# Patient Record
Sex: Male | Born: 1950 | Race: White | Hispanic: No | Marital: Single | Smoking: Former smoker
Health system: Southern US, Community
[De-identification: ages and names within clinical notes are randomized; demographics above are authoritative.]

## PROBLEM LIST (undated history)

## (undated) DIAGNOSIS — I10 Essential (primary) hypertension: Secondary | ICD-10-CM

## (undated) DIAGNOSIS — N179 Acute kidney failure, unspecified: Secondary | ICD-10-CM

## (undated) DIAGNOSIS — K228 Other specified diseases of esophagus: Secondary | ICD-10-CM

## (undated) DIAGNOSIS — L409 Psoriasis, unspecified: Secondary | ICD-10-CM

## (undated) DIAGNOSIS — C229 Malignant neoplasm of liver, not specified as primary or secondary: Secondary | ICD-10-CM

## (undated) DIAGNOSIS — K429 Umbilical hernia without obstruction or gangrene: Secondary | ICD-10-CM

## (undated) DIAGNOSIS — M1990 Unspecified osteoarthritis, unspecified site: Secondary | ICD-10-CM

## (undated) DIAGNOSIS — B1920 Unspecified viral hepatitis C without hepatic coma: Secondary | ICD-10-CM

## (undated) DIAGNOSIS — K7290 Hepatic failure, unspecified without coma: Secondary | ICD-10-CM

## (undated) DIAGNOSIS — I00 Rheumatic fever without heart involvement: Secondary | ICD-10-CM

## (undated) DIAGNOSIS — D696 Thrombocytopenia, unspecified: Secondary | ICD-10-CM

## (undated) DIAGNOSIS — D7589 Other specified diseases of blood and blood-forming organs: Secondary | ICD-10-CM

## (undated) DIAGNOSIS — R011 Cardiac murmur, unspecified: Secondary | ICD-10-CM

## (undated) DIAGNOSIS — E119 Type 2 diabetes mellitus without complications: Secondary | ICD-10-CM

## (undated) DIAGNOSIS — N400 Enlarged prostate without lower urinary tract symptoms: Secondary | ICD-10-CM

## (undated) DIAGNOSIS — I341 Nonrheumatic mitral (valve) prolapse: Secondary | ICD-10-CM

## (undated) DIAGNOSIS — R188 Other ascites: Secondary | ICD-10-CM

## (undated) HISTORY — DX: Other specified diseases of esophagus: K22.8

## (undated) HISTORY — DX: Thrombocytopenia, unspecified: D69.6

## (undated) HISTORY — DX: Essential (primary) hypertension: I10

## (undated) HISTORY — PX: TONSILLECTOMY: SUR1361

## (undated) HISTORY — PX: HERNIA REPAIR: SHX51

## (undated) HISTORY — DX: Psoriasis, unspecified: L40.9

## (undated) HISTORY — DX: Nonrheumatic mitral (valve) prolapse: I34.1

## (undated) HISTORY — PX: TONSILECTOMY, ADENOIDECTOMY, BILATERAL MYRINGOTOMY AND TUBES: SHX2538

## (undated) HISTORY — DX: Other specified diseases of blood and blood-forming organs: D75.89

## (undated) HISTORY — DX: Unspecified viral hepatitis C without hepatic coma: B19.20

---

## 1978-11-29 DIAGNOSIS — B1920 Unspecified viral hepatitis C without hepatic coma: Secondary | ICD-10-CM

## 1978-11-29 HISTORY — DX: Unspecified viral hepatitis C without hepatic coma: B19.20

## 2004-03-30 DIAGNOSIS — K2289 Other specified disease of esophagus: Secondary | ICD-10-CM

## 2004-03-30 HISTORY — DX: Other specified disease of esophagus: K22.89

## 2004-07-05 ENCOUNTER — Inpatient Hospital Stay (HOSPITAL_COMMUNITY): Admission: EM | Admit: 2004-07-05 | Discharge: 2004-07-09 | Payer: Self-pay | Admitting: Emergency Medicine

## 2004-08-29 ENCOUNTER — Ambulatory Visit (HOSPITAL_COMMUNITY): Admission: RE | Admit: 2004-08-29 | Discharge: 2004-08-29 | Payer: Self-pay | Admitting: Gastroenterology

## 2004-09-17 ENCOUNTER — Ambulatory Visit (HOSPITAL_COMMUNITY): Admission: RE | Admit: 2004-09-17 | Discharge: 2004-09-17 | Payer: Self-pay | Admitting: Gastroenterology

## 2009-04-04 ENCOUNTER — Ambulatory Visit: Payer: Self-pay | Admitting: Gastroenterology

## 2009-05-10 ENCOUNTER — Ambulatory Visit (HOSPITAL_COMMUNITY): Admission: RE | Admit: 2009-05-10 | Discharge: 2009-05-10 | Payer: Self-pay | Admitting: Gastroenterology

## 2009-05-30 ENCOUNTER — Ambulatory Visit: Payer: Self-pay | Admitting: Gastroenterology

## 2009-07-04 ENCOUNTER — Ambulatory Visit: Payer: Self-pay | Admitting: Gastroenterology

## 2009-09-12 ENCOUNTER — Ambulatory Visit: Payer: Self-pay | Admitting: Gastroenterology

## 2009-12-05 ENCOUNTER — Ambulatory Visit: Payer: Self-pay | Admitting: Gastroenterology

## 2010-04-20 ENCOUNTER — Encounter: Payer: Self-pay | Admitting: Gastroenterology

## 2010-08-15 NOTE — Op Note (Signed)
NAME:  Ethan Wade, CHRISTMAS NO.:  [id number]   MEDICAL RECORD NO.:  [id number]          PATIENT TYPE:  AMB   LOCATION:  ENDO                         FACILITY:  [facility]   PHYSICIAN:  Naren C. Madilyn Fireman, M.D.    DATE OF BIRTH:  1950/09/25   DATE OF PROCEDURE:  08/29/2004  DATE OF DISCHARGE:                                 OPERATIVE REPORT   PROCEDURE PERFORMED:  Esophagogastroduodenoscopy with esophageal variceal  banding.   INDICATIONS FOR PROCEDURE:  Known esophageal varices with previous bleeding,  status post banding approximately one month ago -- due to hepatitis C.   PROCEDURE:  The patient was placed in the left lateral decubitus position  and placed on the pulse monitor with continuous low-flow oxygen delivered by  nasal cannula.  He was sedated with 100 mcg IV fentanyl and 10 mg IV Versed.  The Olympus video endoscope was advanced under direct vision into the  oropharynx and esophagus. The esophagus was straight and of normal caliber.  Squamocolumnar line was  38 cm.  There were significant bleeding extended  varices, primarily with the mid esophagus, with visible distention of the  varices up to about 22 cm; there was no significant hemorrhage.  The stomach  was entered and a small amount of liquid secretions suctioned from the  fundus.  Retroflexed view of the cardia was unremarkable and revealed no  obvious gastric varices.  The fundus body, antrum and pylorus all appeared  normal,  with the exception of mild appearance of __________ proximally.  The pylorus was not deformed, __________.  Both the bulb and second portion  were inspected and appeared__________.  The scope was withdrawn with the  banding apparatus passed across the __________  six bands were placed  __________.  The scope was then withdrawn and the patient returned to the recovery room  in stable condition. He tolerated procedure well and there were no immediate  complications.   IMPRESSION:   Persistent esophageal varices, status post repeat banding x6.   PLAN:  Will follow up in the office regarding treatment of his hepatitis C.  Will probably perform one more elective endoscopy to see if further banding  is needed.      JCH/MEDQ  D:  08/29/2004  T:  08/29/2004  Job:  [id number]

## 2010-08-15 NOTE — Discharge Summary (Signed)
NAME:  ALGER, KERSTEIN NO.:  [id number]   MEDICAL RECORD NO.:  [id number]          PATIENT TYPE:  INP   LOCATION:  5705                         FACILITY:  [facility]   PHYSICIAN:  Darrold C. Madilyn Fireman, M.D.    DATE OF BIRTH:  10/09/1950   DATE OF ADMISSION:  07/04/2004  DATE OF DISCHARGE:  07/09/2004                                 DISCHARGE SUMMARY   HISTORY OF ILLNESS:  The patient is a 60 year old white male who presented  with the abrupt onset of nausea and vomiting and bright red hematemesis.  He  was initially hypotensive.  His initial hemoglobin was 12.2.  For details,  please see admission history and physical.   COURSE IN THE HOSPITAL:  The patient underwent endoscopy and was found to  have large esophageal varices, although I could not clearly discern a  bleeding source, had difficulty getting the banding apparatus down, and  could not place bands on the varices, and his proximal stomach was not well  inspected due to a large amount of blood in it.  He was admitted to the  intensive care unit and transfused 2 units of packed red blood cells.  He  did fairly well overnight and remained hemodynamically stable throughout his  admission.  On the morning of July 06, 2004, we repeated endoscopy, and  there was no further active bleeding.  The stomach was free of blood.  I did  not see any definite gastric varices, but a portal gastropathy appearance.  He had two or three small distal antral ulcers with positive CLOtest.  Six  bands were placed on the varices.  He tolerated this well and showed no  further evidence of bleeding.  He was treated with octreotide for three  days.  He remained very stable and was transferred out of the intensive care  unit on July 07, 2004.  On July 08, 2004, his diet was advanced, and his  hemoglobin remained stable between 12 and 13.  On July 09, 2004, he was  ambulating, tolerating a diet.  Had one moderate-size black stool which was  the  only stool he had while in the hospital.  He was felt ready for  discharge at that time.   DISCHARGE DIAGNOSES:  1.  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  2.  Small gastric ulcers with positive CLOtest.   CONDITION ON DISCHARGE:  Improved.   DISCHARGE MEDICATIONS:  1.  Prilosec OTC.  2.  Total avoidance of alcohol and nonsteroidal antiinflammatory drugs.   He will have labs done in three days, including a CBC and hepatitis C viral  RNA, and will follow up with Dr. Madilyn Fireman in two to three weeks.      JCH/MEDQ  D:  07/09/2004  T:  07/09/2004  Job:  [id number]   cc:   Lavera Guise, M.D.  [facility]  [place]  [place]

## 2010-08-15 NOTE — Op Note (Signed)
Ethan Wade, KIRK NO.:  [id number]   MEDICAL RECORD NO.:  [id number]          PATIENT TYPE:  INP   LOCATION:  2112                         FACILITY:  [facility]   PHYSICIAN:  Aeric C. Madilyn Fireman, M.D.    DATE OF BIRTH:  09/11/1950   DATE OF PROCEDURE:  07/06/2004  DATE OF DISCHARGE:                                 OPERATIVE REPORT   PROCEDURE:  Esophagogastroduodenoscopy with esophageal banding.   INDICATIONS FOR PROCEDURE:  Upper GI bleeding with varices, seen on EGD 2  days ago, unsuccessful banding due to apparatus down at the time.   ENDOSCOPIST:  Everardo All. Madilyn Fireman, M.D.   PROCEDURE:  The patient was placed in the left lateral decubitus position  and placed on the pulse monitor with continuous low-flow oxygen delivered by  nasal cannula.  He was sedated with 75 mcg IV fentanyl and 7 mg IV Versed.  The Olympus video endoscope was advanced under direct vision into the  oropharynx and esophagus. The esophagus was slightly tortuous but of normal  caliber. The squamocolumnar line was at 38 cm. There were 4+ esophageal  varices up to about 20 cm but no bleeding. The stomach was entered and there  was a portal gastropathy appearance of the stomach. The retroflexed view of  cardia revealed no obvious gastric varices. The fundus and  body appeared  mildly inflamed, edematous.   Within the abdomen, there were seen 3 small prepyloric erosions or ulcers  with no stigma of hemorrhage. The pylorus was not deformed and easily  allowed passage of the endoscope tip into the duodenum. Both bulb and second  portion were inspected and appeared to be within normal limits. The scope  was then withdrawn and banding apparatus applied. The scope was reintroduced  and 6 bands were applied to the varices with  minimal bleeding seen with the  5th and 6th band only.  The scope was then withdrawn and the patient  returned to the recovery room in stable condition.  He tolerated the  procedure  well. There were no immediate complications.   IMPRESSION:  Esophageal varices status post banding, not bleeding at the  onset of the procedure.   PLAN:  Continue octreotide and Protonix. Will check H pylori antibody and  treat for eradication of Helicobacter if positive based on the ulcers.      JCH/MEDQ  D:  07/06/2004  T:  07/06/2004  Job:  [id number]

## 2010-08-15 NOTE — Op Note (Signed)
NAME:  Ethan Wade, GRAVLEY NO.:  [id number]   MEDICAL RECORD NO.:  [id number]          PATIENT TYPE:  EMS   LOCATION:  MAJO                         FACILITY:  [facility]   PHYSICIAN:  Markanthony C. Madilyn Fireman, M.D.    DATE OF BIRTH:  12/24/1950   DATE OF PROCEDURE:  07/04/2004  DATE OF DISCHARGE:                                 OPERATIVE REPORT   PROCEDURE:  Esophagogastroduodenoscopy.   ENDOSCOPIST:  Dorena Cookey, M.D.   INDICATIONS FOR PROCEDURE:  Upper GI bleeding.   PROCEDURE:  The patient was placed in the left lateral decubitus position  and placed on the pulse monitor with continuous low-flow oxygen delivered by  nasal cannula. He was sedated with 100 mcg IV fentanyl and 10 mg of IV  Versed. The Olympus video endoscope was advanced under direct vision into  the oropharynx to the esophagus. The esophagus was slightly tortuous but of  normal caliber. There were large esophageal varices extending up to about 20  cm. There was blood refluxing after the esophagus, but I could not see any  active bleeding within the esophagus. I did not see any definite __________  esophageal varices. The stomach was entered and there was a very viscus body  of dark red to dark brown blood; this was quite viscus and absorbed a good  bit of the light from the scope and it was very difficult, I could not  dilute it significantly with water enough to lavage enough to get a decent  inspection of the proximal stomach. I was able to visualize the antrum and  lesser curvature fairly well and saw no obvious lesions, but I really could  not assess for gastric varices or proximal gastric ulcers. I did not see any  Mallory-Weiss tear or bleeding at the JE junction. The pylorus was non  deformed and easily allowed passage of the endoscope into the duodenum; bulb  and second portion were well-inspected and appeared to be within normal  limits. The scope was then withdrawn and the banding apparatus was applied.  However, despite multiple attempts, I could not get the banding apparatus to  pass down into the esophagus. At this point, I decided to abort the  procedure and possibly try a repeat attempt in a day or so after further  stabilization of the bleeding medically. The scope was then withdrawn and  the patient returned to the recovery room in stable condition. He tolerated  the procedure well and there were no immediate complications.   IMPRESSION:  1.  Large esophageal varices with a large body of blood and clots in the      stomach precluding adequate visualization of the stomach.  2.  Unsuccessful attempt of passing the banding apparatus to apply bands to      the esophagus.   PLAN:  Intensive medical management of his esophageal varices. Will consider  a repeat attempt of banding in a day or two.      JCH/MEDQ  D:  07/05/2004  T:  07/05/2004  Job:  [id number]

## 2010-08-15 NOTE — H&P (Signed)
NAME:  SHEM, PLEMMONS NO.:  [id number]   MEDICAL RECORD NO.:  [id number]          PATIENT TYPE:  EMS   LOCATION:  MAJO                         FACILITY:  [facility]   PHYSICIAN:  Freddie C. Madilyn Fireman, M.D.    DATE OF BIRTH:  1950/05/14   DATE OF ADMISSION:  07/04/2004  DATE OF DISCHARGE:                                HISTORY & PHYSICAL   CHIEF COMPLAINT:  Vomiting blood.   HISTORY OF PRESENT ILLNESS:  The patient is a 60 year old white male who  presented with abrupt onset of nausea and vomiting of dark to bright red  blood this evening. He had not had supper and he states he had had two  beers. He denies any significant abdominal pain. His last stool prior to  this appeared normal. He did state that he had some black stools about a  month ago. His blood pressure initially was 74/49 with pulse of 95 and  temperature of 96.4.  His blood pressure responded to IV fluids. His initial  hemoglobin was 12.2, hematocrit 35, WBC 7700, PT 15.7, platelet count  134,000. He has never had any prior GI bleeding.   PAST MEDICAL HISTORY:  1.  Hepatitis C diagnosed 10 years ago. This has not really been followed      and he has not had any manifestations of chronic liver disease that he      is aware of.  2.  Psoriasis.  3.  History of rheumatic fever.  4.  History of mitral valve prolapse.   PAST SURGICAL HISTORY:  Inguinal hernia repair in 1972.   MEDICATIONS:  Occasional ibuprofen and Ambien.   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 He drinks four  beers a day. He occasionally smokes.   FAMILY HISTORY:  Mother had emphysema. Father had diabetes. No family  history of liver disease, gastrointestinal malignancy, or  peptic ulcer  disease.   PHYSICAL EXAMINATION:  GENERAL: A well-developed, well-nourished white male  currently with an NG tube in place with about 500 cc of dark red bloody  fluid in the canister.  HEENT: No scleral icterus.  HEART: Regular  rate and rhythm without murmur.  LUNGS: Clear.  ABDOMEN: Soft, slightly distended with normoactive bowel sounds. No  hepatosplenomegaly, masses, or guarding.  RECTAL EXAM: Performed on admission was heme negative.   IMPRESSION:  Acute upper GI bleed.   PLAN:  Will proceed with urgent endoscopy and the patient will need  hospitalization and possible transfusion of blood products.      JCH/MEDQ  D:  07/05/2004  T:  07/05/2004  Job:  [id number]   cc:   Dr. Sande Brothers, [place], [place]

## 2010-08-15 NOTE — Op Note (Signed)
NAME:  Ethan Wade, Ethan Wade NO.:  [id number]   MEDICAL RECORD NO.:  [id number]          PATIENT TYPE:  AMB   LOCATION:  ENDO                         [facility]AC[facility]   PHYSICIAN:  Mayer C. Madilyn Fireman, M.D.    DATE OF BIRTH:  [birth year]   DATE OF PROCEDURE:  09/17/2004  DATE OF DISCHARGE:                                 OPERATIVE REPORT   PROCEDURE:  Esophagogastroduodenoscopy with banding of esophageal varices.   INDICATIONS FOR PROCEDURE:  Patient with known esophageal varices status  post banding three weeks ago who has had black stools in the last two days  with a stable hemoglobin of 15.   DESCRIPTION OF PROCEDURE:  The patient was placed in the left lateral  decubitus position then placed on the pulse monitor with continuous low-flow  oxygen delivered by nasal cannula. He was sedated with 100 mcg IV fentanyl  and 8 mg  IV Versed. The Olympus video endoscope was advanced under direct  vision into the oropharynx and esophagus. The esophagus was straight and of  normal caliber with the squamocolumnar line at 37 cm. There were persistent  visible esophageal varices,  one had a protuberant flat visible vessel with  no clot. The stomach was entered and a small amount of liquid secretions  were suctioned from the fundus. Retroflexed view of the cardia showed no  esophageal varices,  but atypical portal gastropathy appearance, otherwise  the fundus, body, and antrum appeared normal. The duodenum was entered and  both the bulb and second portion were inspected and appeared to be within  normal limits. The scope was withdrawn and the Wilson-Cook six band bander  was applied to the endoscope and it was reintroduced. Four bands were  applied to the varices. The first one was applied over the varix with a  visible vessel but the band slipped off and resulted in bleeding. A second  band was placed on the same varix and this one held and appeared to result  in cessation of bleeding.  Three more bands were placed proximal to the first  band placed. The scope was then withdrawn and the patient returned to the  recovery room in stable condition. He tolerated the procedure well and there  were no immediate complications.   IMPRESSION:  Esophageal varices with no active bleeding status post banding.       JCH/MEDQ  D:  09/17/2004  T:  09/17/2004  Job:  [id number]

## 2010-09-29 ENCOUNTER — Encounter: Payer: Self-pay | Admitting: Oncology

## 2010-10-02 ENCOUNTER — Encounter (HOSPITAL_BASED_OUTPATIENT_CLINIC_OR_DEPARTMENT_OTHER): Payer: Self-pay | Admitting: Oncology

## 2010-10-02 ENCOUNTER — Other Ambulatory Visit: Payer: Self-pay | Admitting: Oncology

## 2010-10-02 DIAGNOSIS — D759 Disease of blood and blood-forming organs, unspecified: Secondary | ICD-10-CM

## 2010-10-02 DIAGNOSIS — R161 Splenomegaly, not elsewhere classified: Secondary | ICD-10-CM

## 2010-10-02 DIAGNOSIS — D696 Thrombocytopenia, unspecified: Secondary | ICD-10-CM

## 2010-10-02 LAB — MORPHOLOGY

## 2010-10-02 LAB — CBC WITH DIFFERENTIAL/PLATELET
BASO%: 0.3 % (ref 0.0–2.0)
EOS%: 2.9 % (ref 0.0–7.0)
Eosinophils Absolute: 0.1 10*3/uL (ref 0.0–0.5)
HCT: 42.9 % (ref 38.4–49.9)
MCHC: 36 g/dL (ref 32.0–36.0)
MCV: 104.4 fL — ABNORMAL HIGH (ref 79.3–98.0)
MONO%: 12.1 % (ref 0.0–14.0)
NEUT#: 2.6 10*3/uL (ref 1.5–6.5)
NEUT%: 61.5 % (ref 39.0–75.0)
RBC: 4.11 10*6/uL — ABNORMAL LOW (ref 4.20–5.82)
WBC: 4.3 10*3/uL (ref 4.0–10.3)
lymph#: 1 10*3/uL (ref 0.9–3.3)

## 2010-10-02 LAB — COMPREHENSIVE METABOLIC PANEL
CO2: 25 mEq/L (ref 19–32)
Calcium: 8.7 mg/dL (ref 8.4–10.5)
Chloride: 107 mEq/L (ref 96–112)
Creatinine, Ser: 0.45 mg/dL — ABNORMAL LOW (ref 0.50–1.35)
Glucose, Bld: 173 mg/dL — ABNORMAL HIGH (ref 70–99)
Total Bilirubin: 1.9 mg/dL — ABNORMAL HIGH (ref 0.3–1.2)

## 2010-10-02 LAB — CHCC SMEAR

## 2010-10-02 LAB — LACTATE DEHYDROGENASE: LDH: 286 U/L — ABNORMAL HIGH (ref 94–250)

## 2010-10-06 LAB — PROTEIN ELECTROPHORESIS, SERUM
Albumin ELP: 54.8 % — ABNORMAL LOW (ref 55.8–66.1)
Total Protein, Serum Electrophoresis: 6.5 g/dL (ref 6.0–8.3)

## 2010-10-06 LAB — KAPPA/LAMBDA LIGHT CHAINS
Kappa:Lambda Ratio: 0.96 (ref 0.26–1.65)
Lambda Free Lght Chn: 2.63 mg/dL (ref 0.57–2.63)

## 2010-10-07 ENCOUNTER — Other Ambulatory Visit: Payer: Self-pay | Admitting: Oncology

## 2010-10-07 DIAGNOSIS — R161 Splenomegaly, not elsewhere classified: Secondary | ICD-10-CM

## 2010-10-07 DIAGNOSIS — D696 Thrombocytopenia, unspecified: Secondary | ICD-10-CM

## 2010-10-10 ENCOUNTER — Ambulatory Visit (HOSPITAL_COMMUNITY)
Admission: RE | Admit: 2010-10-10 | Discharge: 2010-10-10 | Disposition: A | Discharge status: Home / Self Care | Payer: PRIVATE HEALTH INSURANCE | Source: Ambulatory Visit | Attending: Oncology | Admitting: Oncology

## 2010-10-10 DIAGNOSIS — K8020 Calculus of gallbladder without cholecystitis without obstruction: Secondary | ICD-10-CM | POA: Insufficient documentation

## 2010-10-10 DIAGNOSIS — D696 Thrombocytopenia, unspecified: Secondary | ICD-10-CM | POA: Insufficient documentation

## 2010-10-10 DIAGNOSIS — R161 Splenomegaly, not elsewhere classified: Secondary | ICD-10-CM | POA: Insufficient documentation

## 2010-10-21 ENCOUNTER — Other Ambulatory Visit: Payer: Self-pay | Admitting: Oncology

## 2010-10-21 ENCOUNTER — Ambulatory Visit (HOSPITAL_COMMUNITY): Payer: PRIVATE HEALTH INSURANCE

## 2010-10-21 ENCOUNTER — Ambulatory Visit (HOSPITAL_COMMUNITY)
Admission: RE | Admit: 2010-10-21 | Discharge: 2010-10-21 | Disposition: A | Discharge status: Home / Self Care | Payer: PRIVATE HEALTH INSURANCE | Source: Ambulatory Visit | Attending: Oncology | Admitting: Oncology

## 2010-10-21 ENCOUNTER — Other Ambulatory Visit: Payer: Self-pay | Admitting: Interventional Radiology

## 2010-10-21 DIAGNOSIS — D696 Thrombocytopenia, unspecified: Secondary | ICD-10-CM | POA: Insufficient documentation

## 2010-10-21 DIAGNOSIS — R161 Splenomegaly, not elsewhere classified: Secondary | ICD-10-CM

## 2010-10-21 LAB — CBC
MCH: 36.1 pg — ABNORMAL HIGH (ref 26.0–34.0)
MCHC: 36.3 g/dL — ABNORMAL HIGH (ref 30.0–36.0)
MCV: 99.5 fL (ref 78.0–100.0)
Platelets: 68 10*3/uL — ABNORMAL LOW (ref 150–400)
RDW: 12.5 % (ref 11.5–15.5)

## 2010-10-21 LAB — PROTIME-INR
INR: 1.18 (ref 0.00–1.49)
Prothrombin Time: 15.2 seconds (ref 11.6–15.2)

## 2010-10-21 LAB — GLUCOSE, CAPILLARY: Glucose-Capillary: 130 mg/dL — ABNORMAL HIGH (ref 70–99)

## 2010-10-23 ENCOUNTER — Ambulatory Visit (INDEPENDENT_AMBULATORY_CARE_PROVIDER_SITE_OTHER): Payer: PRIVATE HEALTH INSURANCE | Admitting: Gastroenterology

## 2010-10-23 VITALS — BP 142/92 | HR 54 | Temp 97.5°F | Ht 70.0 in | Wt 210.0 lb

## 2010-10-23 DIAGNOSIS — K7030 Alcoholic cirrhosis of liver without ascites: Secondary | ICD-10-CM

## 2010-10-23 DIAGNOSIS — B182 Chronic viral hepatitis C: Secondary | ICD-10-CM

## 2010-10-30 NOTE — Progress Notes (Signed)
NAME:  Ethan Wade, Ethan Wade  MR#:  [id number]      DATE:  10/23/2010  DOB:  May 14, 1950    cc: Marney Doctor, MD,  [facility], [address] [place],  [place], [place], [address], Fax [phone] Primary Care Physician:  Juluis Rainier, MD, [facility], [address], [place], [place] [address], Fax [phone] Referring Physician:  Everardo All. 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   History:  The patient returns today unaccompanied. It will be recalled that he is naive to treatment and the diagnosis of cirrhosis is based on history and the presence of varices by endoscopy. When last seen on  09/12/2009, he was not interested in being treated for his hepatitis C. Since that time, there has been no interval episodes of GI bleeding. He reports that he underwent an endoscopy with Dr. Madilyn Fireman in  January 2012. I do not have the report of this but the patient believes the endoscopy for his history of variceal bleeding in 2006 "looked good." He reports he is due for another endoscopy in January 2013. There is no ascites or peripheral edema. There are no symptoms of encephalopathy. His last imaging of the liver was on 10/10/2010, by ultrasound the report of which is available within [facility].  There are no symptoms to suggest cryoglobulin mediated disease.   PAST MEDICAL HISTORY:  Otherwise no interval change. There is a history of diet controlled diabetes.  There is a history of thrombocytopenia for which he has  seen Dr. Gaylyn Rong and reports undergoing a bone marrow biopsy on 10/21/2010, the results which are pending.   CURRENT MEDICATIONS:  Nadolol 20 mg p.o. daily, Ambien 10 mg p.o. at bedtime p.r.n., vitamin D supplement 400 units daily. He reports he had stopped the vitamin D supplement recently.    Allergies:   Denies.    habits:  Smoking denies. No alcohol use for the last 3-4 months, but about 4 months ago he was drinking 2-4 beers per week. It should be noted that he has previously been counseled on the need to completely abstain  from alcohol as recently as 09/12/2009. When asked about counseling, he recalls attending counseling through the [facility] in 2011. The note of 09/12/2009, indicates that he had stopped going because he  stopped drinking alcohol and he did not "like the program." Nevertheless, despite all that, by his own admission he continued drinking 2-4 beers per week until approximately 4 months ago.   REVIEW OF SYSTEMS:  All 10 systems reviewed today with the patient and they are negative other than which was mentioned above. CES-D was 3.   PHYSICAL EXAMINATION:  Constitutional: Well appearing without significant peripheral wasting. Vital signs: Height 70 inches, weight 210 pounds, blood pressure 142/92, pulse of 54, temperature 97.5 Fahrenheit. Ears, nose, mouth  and throat:  Unremarkable oropharynx.  No thyromegaly or neck masses.  Chest:  Resonant to percussion.  Clear to auscultation.  Cardiovascular:  Heart sounds normal S1, S2 without murmurs or rubs.   There is no peripheral edema.  Abdominal:  Normal bowel sounds.  No masses or tenderness.  Liver edge was palpable on deep inspiration as was the spleen tip.  I could not appreciate any hernias.  Lymphatics:   No cervical or inguinal lymphadenopathy.  Central Nervous System:  No asterixis  or focal neurologic findings.  Dermatologic:  Anicteric  without palmar erythema or spider angiomata.  Eyes:  Anicteric sclerae.  Pupils are equal and reactive to light.   LABORATORY STUDIES:  On 10/21/2010; CBC, white count 4.5, hemoglobin 15.9. His platelet count was pending though it has now been 2 days since it was drawn.   On 10/02/2010; His creatinine was 0.45, albumin 3.2, total bilirubin 1.9 with an ALP of 93, AST 86, ALT 68.     On 10/21/2010; PT was 1.18.  Overall this yields a MELD of 11.  Previously a CBC on 10/02/2010; showed a platelet count of 65,000 with a hemoglobin of 15.4, MCV 104.4, total white count of 4.3.  The most recent imaging of his liver on 10/10/2010, showed an unremarkable liver. There is no mention of echogenicity. The spleen was thought to be enlarged. There is no mention of ascites.   Assessment:  The patient is a 60 year old gentleman with history of genotype 1b HCV and alcohol-induced cirrhosis, who is naive to a hepatitis C treatment. Based on his history and the endoscopic findings as  recently as 05/23/2009, at which time he had grade 1 esophageal varices, there is evidence to suggest cirrhosis. Current MELD is 11.  At a MELD of 11, he is not sufficiently decompensated to be evaluated for transplant. Furthermore he has a long-standing history of alcohol use and continued to drink despite undergoing counseling through the [facility], in addition to seeing [place] in the past and [place] telling him he needs to stop drinking. Yet he continued to drink until at least March of this year. He would need 6 months of counseling plus 6 months of urine toxicology and blood alcohol levels if he was ever be considered for transplant. Fortunately, at a MELD 11 he is not in need of consideration. Furthermore, if he was to remain abstinent from alcohol perhaps synthetic function would continue to improve.  In terms of his hepatitis C, his synthetic function is such that I could consider treating him if he is interested in doing so. Of course complete abstinence from alcohol would be an important component in the treatment of hepatitis C. He needs to commit to complete abstinence for a longer period time.  In terms of his cirrhosis care, he has a history of variceal bleeding in 2006, and has been up to date with his variceal screening as recently as January 2012 by his report. This would need to be done  again in  January 2013. In February 2011 he had grade 1 esophageal varices. He is up-to-date with hepatocellular cancer screening by his ultrasound and would need another one 6 months. There is no ascites or  encephalopathy to treat. He is hepatitis A immune and he received his hepatitis B vaccination through our clinic in 2011. 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 I will await Dr. Lodema Pilot report.  In my discussion today with the patient,  we discussed the importance of continued abstinence from alcohol considering his longstanding use, even after being told to stop in our clinic. We discussed the fact that his liver function would stabilize with abstinence from alcohol. We then discussed treatment with pegylated interferon, ribavirin and a protease inhibitor as a genotype 1b hepatitis C patient. I explained  he would need 48 weeks of therapy as a cirrhotic. We discussed the specific system,  constitutional, psychiatric side effects of therapy, as well as response rates. The patient was uninterested in treatment.  I have asked why considering that he has advanced liver disease. He was concerned that his response rate would be low and toxicities would be significant. I have reassured him that response rates will be  respectable, but he is still not interested in treatment. We agreed that I would see me in January 2013, with an ultrasound at that time.   PLAN:  1. Hepatitis A immune. 2. Hepatitis B vaccination completed through our clinic. 3. He will need another endoscopy for his history of variceal bleeding in 2006, by Dr. Madilyn Fireman, in January 2013. 4. He will need to see me again in January 2013 with an ultrasound at that time. 5. Continue with nadolol at 20 mg p.o. daily, in the interim for the history of variceal bleeding. He reports he has sufficient medication.             Brooke Dare, MD   [phone]  D:  Thu Jul 26 17:33:21 2012 ; T:  Thu Jul 26 19:28:38 2012  Job #:  [id number]

## 2010-11-14 ENCOUNTER — Other Ambulatory Visit: Payer: Self-pay | Admitting: Oncology

## 2010-11-14 ENCOUNTER — Encounter (HOSPITAL_BASED_OUTPATIENT_CLINIC_OR_DEPARTMENT_OTHER): Payer: PRIVATE HEALTH INSURANCE | Admitting: Oncology

## 2010-11-14 DIAGNOSIS — D759 Disease of blood and blood-forming organs, unspecified: Secondary | ICD-10-CM

## 2010-11-14 LAB — CBC WITH DIFFERENTIAL/PLATELET
BASO%: 0.5 % (ref 0.0–2.0)
EOS%: 3.5 % (ref 0.0–7.0)
HCT: 43.6 % (ref 38.4–49.9)
MCH: 37.2 pg — ABNORMAL HIGH (ref 27.2–33.4)
MCHC: 35.4 g/dL (ref 32.0–36.0)
NEUT%: 54.2 % (ref 39.0–75.0)
RBC: 4.15 10*6/uL — ABNORMAL LOW (ref 4.20–5.82)
RDW: 12.8 % (ref 11.0–14.6)
lymph#: 1.3 10*3/uL (ref 0.9–3.3)

## 2011-03-26 ENCOUNTER — Other Ambulatory Visit: Payer: Self-pay | Admitting: Gastroenterology

## 2011-03-26 DIAGNOSIS — B182 Chronic viral hepatitis C: Secondary | ICD-10-CM

## 2011-03-26 DIAGNOSIS — K766 Portal hypertension: Secondary | ICD-10-CM

## 2011-03-26 DIAGNOSIS — K7460 Unspecified cirrhosis of liver: Secondary | ICD-10-CM

## 2011-03-26 MED ORDER — NADOLOL 20 MG PO TABS: 20.0000 mg | ORAL_TABLET | Freq: Every day | ORAL | Status: DC

## 2011-03-27 ENCOUNTER — Telehealth: Payer: Self-pay | Admitting: Oncology

## 2011-03-27 NOTE — Telephone Encounter (Signed)
In [facility] there  was an order for mid level and lab for 1/13,lab only 3/13 and dr ha and lab for 6/13.  Since no mid level at this time appt was made for lab and dr ha for 1/25 only.  Pt is aware of appt.

## 2011-04-12 ENCOUNTER — Encounter: Payer: Self-pay | Admitting: Oncology

## 2011-04-17 ENCOUNTER — Encounter: Payer: Self-pay | Admitting: *Deleted

## 2011-04-22 ENCOUNTER — Ambulatory Visit: Payer: PRIVATE HEALTH INSURANCE | Admitting: Oncology

## 2011-04-22 ENCOUNTER — Other Ambulatory Visit: Payer: PRIVATE HEALTH INSURANCE | Admitting: Lab

## 2011-04-24 ENCOUNTER — Other Ambulatory Visit (HOSPITAL_BASED_OUTPATIENT_CLINIC_OR_DEPARTMENT_OTHER): Payer: PRIVATE HEALTH INSURANCE | Admitting: Lab

## 2011-04-24 ENCOUNTER — Ambulatory Visit (HOSPITAL_BASED_OUTPATIENT_CLINIC_OR_DEPARTMENT_OTHER): Payer: PRIVATE HEALTH INSURANCE | Admitting: Oncology

## 2011-04-24 VITALS — BP 121/77 | HR 72 | Temp 97.8°F | Ht 70.0 in | Wt 203.4 lb

## 2011-04-24 DIAGNOSIS — L408 Other psoriasis: Secondary | ICD-10-CM

## 2011-04-24 DIAGNOSIS — B1920 Unspecified viral hepatitis C without hepatic coma: Secondary | ICD-10-CM

## 2011-04-24 DIAGNOSIS — D759 Disease of blood and blood-forming organs, unspecified: Secondary | ICD-10-CM

## 2011-04-24 DIAGNOSIS — D696 Thrombocytopenia, unspecified: Secondary | ICD-10-CM

## 2011-04-24 LAB — CBC WITH DIFFERENTIAL/PLATELET
BASO%: 0.4 % (ref 0.0–2.0)
Basophils Absolute: 0 10*3/uL (ref 0.0–0.1)
EOS%: 3.3 % (ref 0.0–7.0)
HGB: 16.1 g/dL (ref 13.0–17.1)
MCH: 37.4 pg — ABNORMAL HIGH (ref 27.2–33.4)
RDW: 12.7 % (ref 11.0–14.6)
WBC: 5.2 10*3/uL (ref 4.0–10.3)
lymph#: 1.2 10*3/uL (ref 0.9–3.3)

## 2011-04-24 LAB — COMPREHENSIVE METABOLIC PANEL
AST: 49 U/L — ABNORMAL HIGH (ref 0–37)
Albumin: 3.4 g/dL — ABNORMAL LOW (ref 3.5–5.2)
Alkaline Phosphatase: 90 U/L (ref 39–117)
Glucose, Bld: 273 mg/dL — ABNORMAL HIGH (ref 70–99)
Potassium: 4.2 mEq/L (ref 3.5–5.3)
Sodium: 134 mEq/L — ABNORMAL LOW (ref 135–145)
Total Protein: 6 g/dL (ref 6.0–8.3)

## 2011-04-24 NOTE — Progress Notes (Signed)
[facility] OFFICE PROGRESS NOTE  Cc:  No primary provider on file.  DIAGNOSIS:  Macrocytosis and thrombocytopenia secondary to Hepatitis C;  hematology work up including bone marrow biopsy was negative..  CURRENT TREATMENT:  Patient still prefers to wait before starting treatment for hepatitis C.  INTERVAL HISTORY: Ethan Wade 61 y.o. male returns for regular follow up.  He is still working full time with lots of traveling.  He feels a bit tired by the end of the week.  He still has psoriasis skin rash diffusely.  He denies bleeding symptoms.   Patient denies  headache, visual changes, confusion, drenching night sweats, palpable lymph node swelling, mucositis, odynophagia, dysphagia, nausea vomiting, jaundice, chest pain, palpitation, shortness of breath, dyspnea on exertion, productive cough, gum bleeding, epistaxis, hematemesis, hemoptysis, abdominal pain, abdominal swelling, early satiety, melena, hematochezia, hematuria, skin rash, spontaneous bleeding, joint swelling, joint pain, heat or cold intolerance, bowel bladder incontinence, back pain, focal otor weakness, paresthesia, depression, suicidal or homocidal ideation, feeling hopelessness.   MEDICAL HISTORY: Past Medical History  Diagnosis Date  . Macrocytosis   . Thrombocytopenia   . Hepatitis C 1980's  . Esophageal bleed, non-variceal 2006  . Diabetes mellitus   . HTN (hypertension)   . Psoriasis   . Mitral valve prolapse   . Vitamin D deficiency   . ETOH dependence     SURGICAL HISTORY:  Past Surgical History  Procedure Date  . Tonsilectomy, adenoidectomy, bilateral myringotomy and tubes   . Hernia repair     MEDICATIONS: Current Outpatient Prescriptions  Medication Sig Dispense Refill  . Cholecalciferol (VITAMIN D3) 2000 UNITS TABS Take by mouth.      . Hydrocortisone (HYTONE EX) Apply topically.      Marland Kitchen ibuprofen (ADVIL,MOTRIN) 200 MG tablet Take 200 mg by mouth every 6 (six) hours as needed.       . nadolol (CORGARD) 20 MG tablet Take 1 tablet (20 mg total) by mouth at bedtime.  30 tablet  11  . zolpidem (AMBIEN) 10 MG tablet Take 10 mg by mouth at bedtime as needed.        ALLERGIES:   has no known allergies.  REVIEW OF SYSTEMS:  The rest of the 14-point review of system was negative.   Filed Vitals:   04/24/11 1216  BP: 121/77  Pulse: 72  Temp: 97.8 F (36.6 C)   Wt Readings from Last 3 Encounters:  04/24/11 203 lb 6.4 oz (92.262 kg)  11/14/10 209 lb 6.4 oz (94.983 kg)  10/23/10 210 lb (95.255 kg)   ECOG Performance status: 1  PHYSICAL EXAMINATION:   General:  well-nourished in no acute distress.  Eyes:  no scleral icterus.  ENT:  There were no oropharyngeal lesions.  Neck was without thyromegaly.  Lymphatics:  Negative cervical, supraclavicular or axillary adenopathy.  Respiratory: lungs were clear bilaterally without wheezing or crackles.  Cardiovascular:  Regular rate and rhythm, S1/S2, without murmur, rub or gallop.  There was no pedal edema.  GI:  abdomen was soft, flat, nontender, nondistended, without organomegaly.  Muscoloskeletal:  no spinal tenderness of palpation of vertebral spine.  Skin exam showed extensive flaky, erythematous skin rash.   Neuro exam was nonfocal.  Patient was able to get on and off exam table without assistance.  Gait was normal.  Patient was alerted and oriented.  Attention was good.   Language was appropriate.  Mood was normal without depression.  Speech was not pressured.  Thought content was not tangential.  LABORATORY/RADIOLOGY DATA:  Lab Results  Component Value Date   WBC 5.2 04/24/2011   HGB 16.1 04/24/2011   HCT 44.9 04/24/2011   PLT 82* 04/24/2011   GLUCOSE 173* 10/02/2010   ALT 68* 10/02/2010   AST 86* 10/02/2010   NA 140 10/02/2010   K 4.2 10/02/2010   CL 107 10/02/2010   CREATININE 0.45* 10/02/2010   BUN 10 10/02/2010   CO2 25 10/02/2010   TSH 1.363 10/02/2010   TSH 1.363 10/02/2010   TSH 1.363 10/02/2010   INR 1.18 10/21/2010     ASSESSMENT AND PLAN:    1. Hepatitis C: he follows up with Dr. Jacqualine Mau.  He still wants to wait since he does not think that he can be working on therapy for HepC due to its side effects.  I defer this decision to pt and Dr. Jacqualine Mau.  2. 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  There is no concern for leukemia or lymphoma on past work up with bone marrow biopsy.   There was low clinical suspicion for ITP and other destructive processes.   I discussed with him that the risk of bleeding is relatively low in patients with counts of more than 30,000 unless he has esophageal varices which needs to be examined by his GI physician.  I will follow up with him with lab tests in 4 and 8 months.  I will see him in 1 year.  3. Alcohol use:  He has abstained completely since July 4th 2012.  4. Psoriasis:  He is not symptomatic, and he does not want any treatment at this time.

## 2011-08-21 ENCOUNTER — Other Ambulatory Visit (HOSPITAL_BASED_OUTPATIENT_CLINIC_OR_DEPARTMENT_OTHER): Payer: PRIVATE HEALTH INSURANCE

## 2011-08-21 ENCOUNTER — Telehealth: Payer: Self-pay | Admitting: *Deleted

## 2011-08-21 DIAGNOSIS — D696 Thrombocytopenia, unspecified: Secondary | ICD-10-CM

## 2011-08-21 LAB — CBC WITH DIFFERENTIAL/PLATELET
EOS%: 3.9 % (ref 0.0–7.0)
MCH: 36.6 pg — ABNORMAL HIGH (ref 27.2–33.4)
MCHC: 35.2 g/dL (ref 32.0–36.0)
MCV: 103.8 fL — ABNORMAL HIGH (ref 79.3–98.0)
MONO%: 11 % (ref 0.0–14.0)
RBC: 4.34 10*6/uL (ref 4.20–5.82)
RDW: 12.8 % (ref 11.0–14.6)
nRBC: 0 % (ref 0–0)

## 2011-08-21 NOTE — Telephone Encounter (Signed)
Called Ethan Wade w/ results of labs and informed Platelet count still low but stable per Dr. Gaylyn Rong and due to his Hep C.  Asked if he has started any treatment for his Hep C yet or has seen Dr. Scot Jun lately?  Ethan Wade states he has not seen Dr. Scot Jun in "a while" and continues to hold off on treatment due to the possible side effects.   Ethan Wade states he continues to refrain from alcohol.  Instructed Ethan Wade to inform us if any changes in his condition or treatment and keep lab appt as scheduled for September.   He verbalized understanding.

## 2011-08-21 NOTE — Telephone Encounter (Signed)
Message copied by Wende Mott on Fri Aug 21, 2011  1:28 PM ------      Message from: Ethan Wade      Created: Fri Aug 21, 2011 12:43 PM       Please call patient and inform him that his plt is relatively stable.  This is due to HepC.  Will he see a liver MD to start treatment soon?  Please advise him to refrain from EtOH completely.  Thanks.

## 2011-11-09 NOTE — Progress Notes (Signed)
Received office notes from Dr. Milinda Pointer @ [facility]; forwarded to Dr. Gaylyn Rong.

## 2011-11-13 NOTE — Progress Notes (Signed)
Attempted to call patient several times this week; Left voice message with patient to call office to get lab results.

## 2011-11-16 ENCOUNTER — Telehealth: Payer: Self-pay | Admitting: *Deleted

## 2011-11-16 NOTE — Telephone Encounter (Signed)
Pt left VM this morning states returning our call.  I called pt back and left him another VM asking him to call back again so I could relay a message to him from Dr. Gaylyn Rong regarding his lab work.

## 2011-11-16 NOTE — Telephone Encounter (Signed)
Pt left another VM states he is out of town and will call back later this week.  I attempted to call pt one more time and I left him a VM that we will wait to hear from him again later this week.  Need to relay following message from Dr. Gaylyn Rong;   "Please call patient and inform him that his plt is relatively stable. This is due to HepC. Will he see a liver MD to start treatment soon? Please advise him to refrain from EtOH completely. Thanks."

## 2011-11-17 ENCOUNTER — Telehealth: Payer: Self-pay

## 2011-11-17 NOTE — Telephone Encounter (Signed)
Message copied by Kallie Locks on Tue Nov 17, 2011  2:53 PM ------      Message from: Jethro Bolus T      Created: Tue Nov 10, 2011  9:51 AM       Please call pt.  His plt is still low but stable.  This is from his hep C.   Again, I recommend seeing a liver doctor for treatment with goal to prevent cirrhosis and liver cancer.    Let me know if he wants to be referred.  Thanks.

## 2011-12-20 ENCOUNTER — Emergency Department (HOSPITAL_COMMUNITY)
Admission: EM | Admit: 2011-12-20 | Discharge: 2011-12-21 | Disposition: A | Discharge status: Home / Self Care | Payer: PRIVATE HEALTH INSURANCE | Attending: Emergency Medicine | Admitting: Emergency Medicine

## 2011-12-20 ENCOUNTER — Encounter (HOSPITAL_COMMUNITY): Payer: Self-pay | Admitting: Emergency Medicine

## 2011-12-20 DIAGNOSIS — K420 Umbilical hernia with obstruction, without gangrene: Secondary | ICD-10-CM | POA: Insufficient documentation

## 2011-12-20 DIAGNOSIS — E119 Type 2 diabetes mellitus without complications: Secondary | ICD-10-CM | POA: Insufficient documentation

## 2011-12-20 DIAGNOSIS — I10 Essential (primary) hypertension: Secondary | ICD-10-CM | POA: Insufficient documentation

## 2011-12-20 DIAGNOSIS — Z79899 Other long term (current) drug therapy: Secondary | ICD-10-CM | POA: Insufficient documentation

## 2011-12-20 LAB — COMPREHENSIVE METABOLIC PANEL
ALT: 67 U/L — ABNORMAL HIGH (ref 0–53)
AST: 77 U/L — ABNORMAL HIGH (ref 0–37)
Albumin: 3.3 g/dL — ABNORMAL LOW (ref 3.5–5.2)
CO2: 25 mEq/L (ref 19–32)
Calcium: 9.4 mg/dL (ref 8.4–10.5)
GFR calc non Af Amer: >90 mL/min (ref >90)
Sodium: 136 mEq/L (ref 135–145)

## 2011-12-20 LAB — URINALYSIS, MICROSCOPIC ONLY
Glucose, UA: >1000 mg/dL — AB
Hgb urine dipstick: NEGATIVE
Specific Gravity, Urine: 1.045 — ABNORMAL HIGH (ref 1.005–1.030)
Urobilinogen, UA: 1 mg/dL (ref 0.0–1.0)
pH: 5 (ref 5.0–8.0)

## 2011-12-20 LAB — CBC WITH DIFFERENTIAL/PLATELET
Basophils Absolute: 0 10*3/uL (ref 0.0–0.1)
Eosinophils Relative: 4 % (ref 0–5)
Lymphocytes Relative: 27 % (ref 12–46)
Neutro Abs: 2.3 10*3/uL (ref 1.7–7.7)
Platelets: 68 10*3/uL — ABNORMAL LOW (ref 150–400)
RDW: 12.5 % (ref 11.5–15.5)
WBC: 4.1 10*3/uL (ref 4.0–10.5)

## 2011-12-20 NOTE — ED Notes (Signed)
Pt alert, arrives from home, c/o epigastric pain, right flank pain, onset several weeks ago, resp even unlabored, skin pwd, recently treated for UTI, denies changes in bowel, + discomfort with urination

## 2011-12-21 ENCOUNTER — Emergency Department (HOSPITAL_COMMUNITY): Payer: PRIVATE HEALTH INSURANCE

## 2011-12-21 MED ORDER — ONDANSETRON HCL 4 MG/2ML IJ SOLN
4.0000 mg | Freq: Once | INTRAMUSCULAR | Status: AC
  Administered 2011-12-21: 4 mg via INTRAVENOUS
  Filled 2011-12-21: qty 2

## 2011-12-21 MED ORDER — HYDROMORPHONE HCL PF 1 MG/ML IJ SOLN
1.0000 mg | Freq: Once | INTRAMUSCULAR | Status: AC
  Administered 2011-12-21: 1 mg via INTRAVENOUS
  Filled 2011-12-21: qty 1

## 2011-12-21 MED ORDER — SODIUM CHLORIDE 0.9 % IV SOLN
Freq: Once | INTRAVENOUS | Status: AC
  Administered 2011-12-21: 02:00:00 via INTRAVENOUS

## 2011-12-21 MED ORDER — OXYCODONE-ACETAMINOPHEN 5-325 MG PO TABS: 1.0000 | ORAL_TABLET | Freq: Four times a day (QID) | ORAL | Status: DC | PRN

## 2011-12-21 MED ORDER — ONDANSETRON HCL 4 MG PO TABS: 4.0000 mg | ORAL_TABLET | Freq: Four times a day (QID) | ORAL | Status: DC

## 2011-12-21 NOTE — ED Provider Notes (Signed)
Medical screening examination/treatment/procedure(s) were conducted as a shared visit with non-physician practitioner(s) and myself.  I personally evaluated the patient during the encounter  Abdomen soft, nontender status post reduction of hernia. No umbilical hernia palpated.   Hanley Seamen, MD 12/21/11 [phone]

## 2011-12-21 NOTE — ED Notes (Signed)
Patient transported to X-ray 

## 2011-12-21 NOTE — ED Notes (Signed)
EDPA Tiffany reduced umbilical hernia- Pt is now more comfortable

## 2011-12-21 NOTE — ED Provider Notes (Signed)
History     CSN: [id number]  Arrival date & time 12/20/11  1906   First MD Initiated Contact with Patient 12/21/11 0118      Chief Complaint  Patient presents with  . Abdominal Pain    (Consider location/radiation/quality/duration/timing/severity/associated sxs/prior treatment) HPI  Patient presents to the emergency department with abdominal pain. He states that it started acutely this evening and is most severe periumbilical and then radiates to the left and to the right and down towards the suprapubic area. He did have a regular bowel movement this morning but has not passed any gas or have another bowel movement since then has had some mild discomfort with urination. He does have an umbilical hernia which he says looks bigger today. Has not had any fevers, nausea, vomiting, chills, diarrhea. He denies ever having any abdominal surgeries. Patient is in no acute distress and vital signs are stable he is however in moderate pain.  Past Medical History  Diagnosis Date  . Macrocytosis   . Thrombocytopenia   . Hepatitis C 1980's  . Esophageal bleed, non-variceal 2006  . Diabetes mellitus   . HTN (hypertension)   . Psoriasis   . Mitral valve prolapse   . Vitamin d deficiency   . EtOH dependence     Past Surgical History  Procedure Date  . Tonsilectomy, adenoidectomy, bilateral myringotomy and tubes   . Hernia repair     No family history on file.  History  Substance Use Topics  . Smoking status: Not on file  . Smokeless tobacco: Not on file  . Alcohol Use: Not on file      Review of Systems  Review of Systems  Gen: no weight loss, fevers, chills, night sweats  Eyes: no discharge or drainage, no occular pain or visual changes  Nose: no epistaxis or rhinorrhea  Mouth: no dental pain, no sore throat  Neck: no neck pain  Lungs:No wheezing, coughing or hemoptysis CV: no chest pain, palpitations, dependent edema or orthopnea  Abd: + abdominal pain,negative for   nausea, vomiting  GU: no dysuria or gross hematuria  MSK:  No abnormalities  Neuro: no headache, no focal neurologic deficits  Skin: no abnormalities Psyche: negative.   Allergies  Review of patient's allergies indicates no known allergies.  Home Medications   Current Outpatient Rx  Name Route Sig Dispense Refill  . IBUPROFEN 200 MG PO TABS Oral Take 600 mg by mouth every 6 (six) hours as needed. pain    . NADOLOL 20 MG PO TABS Oral Take 20 mg by mouth at bedtime.    . TAMSULOSIN HCL 0.4 MG PO CAPS Oral Take 0.4 mg by mouth daily.    Marland Kitchen VITAMIN D (ERGOCALCIFEROL) 50000 UNITS PO CAPS Oral Take 50,000 Units by mouth every 7 (seven) days. On fridays    . ZOLPIDEM TARTRATE 10 MG PO TABS Oral Take 10 mg by mouth at bedtime as needed. SLEEP    . ONDANSETRON HCL 4 MG PO TABS Oral Take 1 tablet (4 mg total) by mouth every 6 (six) hours. 12 tablet 0  . OXYCODONE-ACETAMINOPHEN 5-325 MG PO TABS Oral Take 1 tablet by mouth every 6 (six) hours as needed for pain. 15 tablet 0    BP 139/72  Pulse 62  Temp 97.9 F (36.6 C) (Oral)  Resp 18  Wt 195 lb (88.451 kg)  SpO2 99%  Physical Exam  Nursing note and vitals reviewed. Constitutional: He is oriented to person, place, and time. He appears  well-developed and well-nourished.  HENT:  Head: Normocephalic and atraumatic.  Right Ear: Tympanic membrane, external ear and ear canal normal.  Left Ear: Tympanic membrane, external ear and ear canal normal.  Nose: Nose normal. No rhinorrhea. Right sinus exhibits no maxillary sinus tenderness and no frontal sinus tenderness. Left sinus exhibits no maxillary sinus tenderness and no frontal sinus tenderness.  Mouth/Throat: Uvula is midline and mucous membranes are normal.  Eyes: Conjunctivae normal are normal.  Neck: Trachea normal, normal range of motion and full passive range of motion without pain. Neck supple. No rigidity. Normal range of motion present. No Brudzinski's sign noted.  Cardiovascular:  Normal rate and regular rhythm.   Pulmonary/Chest: Effort normal and breath sounds normal.  Abdominal: Soft. There is tenderness in the periumbilical area. A hernia (umbilical hernia bulging ) is present.       No obvious evidence of splenomegaly. Non ttp.   Musculoskeletal: Normal range of motion.  Lymphadenopathy:       Head (right side): No preauricular and no posterior auricular adenopathy present.       Head (left side): No preauricular and no posterior auricular adenopathy present.  Neurological: He is alert and oriented to person, place, and time.  Skin: Skin is warm and dry.  Psychiatric: He has a normal mood and affect.    ED Course  Procedures (including critical care time)  Labs Reviewed  CBC WITH DIFFERENTIAL - Abnormal; Notable for the following:    MCH 35.7 (*)     MCHC 36.1 (*)     Platelets 68 (*)     All other components within normal limits  COMPREHENSIVE METABOLIC PANEL - Abnormal; Notable for the following:    Glucose, Bld 262 (*)     Albumin 3.3 (*)     AST 77 (*)     ALT 67 (*)     Total Bilirubin 1.9 (*)     All other components within normal limits  URINALYSIS, MICROSCOPIC ONLY - Abnormal; Notable for the following:    Color, Urine AMBER (*)  BIOCHEMICALS MAY BE AFFECTED BY COLOR   Specific Gravity, Urine 1.045 (*)     Glucose, UA >1000 (*)     Bilirubin Urine SMALL (*)     Ketones, ur TRACE (*)     All other components within normal limits   Dg Abd 1 View  12/21/2011  **ADDENDUM** CREATED: 12/21/2011 02:30:30  I received a call from Ms. Neva Seat stating that a periumbilical hernia had just been reduced and this may have accounted for the appearance of small bowel obstruction.  **END ADDENDUM** SIGNED BY: Andreas Newport, M.D.   12/21/2011  *RADIOLOGY REPORT*  Clinical Data: Abdominal pain.  Nausea.  ABDOMEN - 1 VIEW  Comparison: None.  Findings: Dilated loops of small bowel are present measuring up to 48 mm.  Findings are highly suggestive of small bowel  obstruction. Calcification in the right upper quadrant likely represents calcified gallstones. No gross plain film evidence of free air.  Rectal gas is present however this may be due to early small bowel obstruction or partial small bowel obstruction.  IMPRESSION: Dilated loops of small bowel compatible with small bowel obstruction which may be early or partial.  Maximal diameter is 48 mm.  Right upper quadrant calcification likely represents cholelithiasis.   Original Report Authenticated By: Andreas Newport, M.D.      1. Strangulated umbilical hernia       MDM  Shunt umbilical hernia at bedside. The patient  felt immediate relief. Xray  Results show bowel backed up but this can be caused by the hernia obstructing. He has been advised that if his pain continues or he develops N/V and does not have normal BM tomorrow that he needs to be seen again. Prior to DC the patients stomach is only mildly tender or "a bit sore" as he puts it.  Referral to surgery given as well as pain meds. Dr. Read Drivers has seen patient as well. Pt has been advised of the symptoms that warrant their return to the ED. Patient has voiced understanding and has agreed to follow-up with the PCP or specialist.        Dorthula Matas, PA 12/21/11 [phone]

## 2011-12-23 ENCOUNTER — Ambulatory Visit (INDEPENDENT_AMBULATORY_CARE_PROVIDER_SITE_OTHER): Payer: PRIVATE HEALTH INSURANCE | Admitting: General Surgery

## 2011-12-23 ENCOUNTER — Encounter (INDEPENDENT_AMBULATORY_CARE_PROVIDER_SITE_OTHER): Payer: Self-pay | Admitting: General Surgery

## 2011-12-23 VITALS — BP 124/84 | HR 76 | Temp 97.9°F | Resp 16 | Ht 70.5 in | Wt 190.0 lb

## 2011-12-23 DIAGNOSIS — K429 Umbilical hernia without obstruction or gangrene: Secondary | ICD-10-CM

## 2011-12-23 NOTE — Progress Notes (Signed)
Subjective:   umbilical hernia  Patient ID: Ethan Wade, male   DOB: 09/08/1950, 61 y.o.   MRN: [id number]  HPI  Patient is a 61 year old male followed by Dr. Zachery Dauer and referred by Marlon Pel PA at the emergency room for an umbilical hernia. The patient has had a known umbilical hernia for some time but it has not really been at all symptomatic. However several days ago he developed acute pain in his umbilicus with some referral around to his abdomen in general any noted the hernia was hard and swollen. He presented to the emergency room. He did not have any nausea or vomiting. The hernia was reduced in the emergency room with relief of his symptoms. He did have a KUB performed which showed early partial small bowel obstruction. Also noted were some calcifications in the right upper quadrant probable gallstones. He has not had epigastric or right upper quadrant or postprandial symptoms or any other episodes of abdominal pain to suggest symptomatic gallbladder disease. The pain at this episode was centered around his umbilicus. Past Medical History  Diagnosis Date  . Macrocytosis   . Thrombocytopenia   . Hepatitis C 1980's  . Esophageal bleed, non-variceal 2006  . Diabetes mellitus   . HTN (hypertension)   . Psoriasis   . Mitral valve prolapse   . Vitamin d deficiency   . EtOH dependence    Past Surgical History  Procedure Date  . Tonsilectomy, adenoidectomy, bilateral myringotomy and tubes child  . Inguinal hernia repair 1970's    left   Current Outpatient Prescriptions  Medication Sig Dispense Refill  . ibuprofen (ADVIL,MOTRIN) 200 MG tablet Take 600 mg by mouth every 6 (six) hours as needed. pain      . nadolol (CORGARD) 20 MG tablet Take 20 mg by mouth at bedtime.      . ondansetron (ZOFRAN) 4 MG tablet Take 1 tablet (4 mg total) by mouth every 6 (six) hours.  12 tablet  0  . oxyCODONE-acetaminophen (PERCOCET/ROXICET) 5-325 MG per tablet Take 1 tablet by mouth every 6 (six)  hours as needed for pain.  15 tablet  0  . Tamsulosin HCl (FLOMAX) 0.4 MG CAPS Take 0.4 mg by mouth daily.      . Vitamin D, Ergocalciferol, (DRISDOL) 50000 UNITS CAPS Take 50,000 Units by mouth every 7 (seven) days. On fridays      . zolpidem (AMBIEN) 10 MG tablet Take 10 mg by mouth at bedtime as needed. SLEEP       No Known Allergies History  Substance Use Topics  . Smoking status: Former Smoker    Start date: 12/22/2005  . Smokeless tobacco: Not on file  . Alcohol Use: 1.0 oz/week    2 drink(s) per week     Review of Systems  Respiratory: Negative.   Cardiovascular: Negative.   Gastrointestinal: Positive for abdominal pain. Negative for nausea, vomiting, diarrhea and constipation.  Skin: Positive for rash.       Objective:   Physical Exam BP 124/84  Pulse 76  Temp 97.9 F (36.6 C) (Temporal)  Resp 16  Ht 5' 10.5" (1.791 m)  Wt 190 lb (86.183 kg)  BMI 26.88 kg/m2 General: Well-appearing Caucasian male in no distress Skin: Scattered scaly rash over his trunk and extremities. HEENT: No palpable masses or thyromegaly. Sclera nonicteric. Oropharynx clear. Lungs: Clear equal breath sounds bilaterally Cardiovascular: Regular rate and rhythm. No edema. No murmurs. Abdomen: Nondistended. There is a moderate diastases. Well-healed left inguinal scar. There  is a discrete probably 1-2 cm umbilical hernia that is slightly tender but reducible. There is probable splenomegaly. Liver is nonpalpable. No apparent ascites. Extremities: No joint swelling or edema Neurologic: alert and fully oriented and affect normal. Gait normal.    Assessment:     Umbilical hernia with apparent recent episode of incarceration and partial small bowel obstruction. This clearly needs to be repaired as he is at risk for further episodes of incarceration. Also noted were probable gallstones on his plain abdominal x-rays but I cannot elicit any symptoms to suggest biliary tract pain. For this reason it  particularly in light of his hepatitis and history of esophageal bleed I would recommend only repair of his umbilical hernia at this time. I recommended an open approach with a mesh patch to be done as an outpatient under general anesthesia. We discussed the indications for the procedure and risks of general anesthesia, bleeding, infection, rare risk of intestinal injury small bowel obstruction and risk of recurrence. All his questions were answered.    Plan:     Open repair of umbilical hernia as an outpatient under general anesthesia with mesh.

## 2011-12-24 ENCOUNTER — Telehealth: Payer: Self-pay | Admitting: Oncology

## 2011-12-24 NOTE — Telephone Encounter (Signed)
lvm for pt regarding schedule change in Jan....mailed updated Jan schedule to pt....sed

## 2011-12-25 ENCOUNTER — Other Ambulatory Visit (HOSPITAL_BASED_OUTPATIENT_CLINIC_OR_DEPARTMENT_OTHER): Payer: PRIVATE HEALTH INSURANCE

## 2011-12-25 ENCOUNTER — Telehealth: Payer: Self-pay | Admitting: *Deleted

## 2011-12-25 DIAGNOSIS — D696 Thrombocytopenia, unspecified: Secondary | ICD-10-CM

## 2011-12-25 LAB — COMPREHENSIVE METABOLIC PANEL (CC13)
ALT: 65 U/L — ABNORMAL HIGH (ref 0–55)
AST: 74 U/L — ABNORMAL HIGH (ref 5–34)
Alkaline Phosphatase: 98 U/L (ref 40–150)
BUN: 8 mg/dL (ref 7.0–26.0)
Calcium: 9.1 mg/dL (ref 8.4–10.4)
Chloride: 103 mEq/L (ref 98–107)
Creatinine: 0.8 mg/dL (ref 0.7–1.3)
Total Bilirubin: 2.9 mg/dL — ABNORMAL HIGH (ref 0.20–1.20)

## 2011-12-25 LAB — CBC WITH DIFFERENTIAL/PLATELET
BASO%: 0.6 % (ref 0.0–2.0)
EOS%: 3.4 % (ref 0.0–7.0)
HCT: 45.9 % (ref 38.4–49.9)
LYMPH%: 26 % (ref 14.0–49.0)
MCH: 36.8 pg — ABNORMAL HIGH (ref 27.2–33.4)
MCHC: 35.2 g/dL (ref 32.0–36.0)
MONO#: 0.4 10*3/uL (ref 0.1–0.9)
NEUT%: 60 % (ref 39.0–75.0)
Platelets: 68 10*3/uL — ABNORMAL LOW (ref 140–400)
RBC: 4.39 10*6/uL (ref 4.20–5.82)
WBC: 4.2 10*3/uL (ref 4.0–10.3)

## 2011-12-25 LAB — CHCC SMEAR

## 2011-12-25 NOTE — Telephone Encounter (Signed)
Message copied by Wende Mott on Fri Dec 25, 2011  3:35 PM ------      Message from: Jethro Bolus T      Created: Fri Dec 25, 2011  2:32 PM       Please call pt.  His plt is still low but stable.  Has he seen a liver doctor yet for his hepatitis (that is causing thrombopenia)?   Thanks.

## 2011-12-25 NOTE — Telephone Encounter (Signed)
Called pt and no answer at home number.  Left VM on his cell phone for him to return my call regarding his lab results.

## 2012-02-03 IMAGING — CT CT BIOPSY
1 of 2 series · 14 of 32 positions shown, 19 images · non-contrast
Comparison: none

Clinical Data/Indication: THROMBOCYTOPENIA

CT-GUIDED BONE MARROW ASPIRATE AND CORE.
Sedation: Versed two mg, Fentanyl 100 mcg.
Total Moderate Sedation Time: 10 minutes.
Procedure: The procedure, risks, benefits, and alternatives were
explained to the patient. Questions regarding the procedure were
encouraged and answered. The patient understands and consents to
the procedure.
The back was prepped with betadine in a sterile fashion, and a
sterile drape was applied covering the operative field. A mask and
sterile gloves were used for the procedure.
Under CT guidance, 11 gauge needle was inserted into the left iliac
bone.  Aspirates were obtained.  A core was obtained. Final imaging
was performed.
Patient tolerated the procedure well without complication.  Vital
sign monitoring by nursing staff during the procedure will continue
as patient is in the special procedures unit for post procedure
observation.

[Series 2: rtn pelvis st · axial · 0.71mm/px · z∈[-182,-77]mm · 14 of 25 slices shown, 19 images]
[im 2/25  soft-tissue]
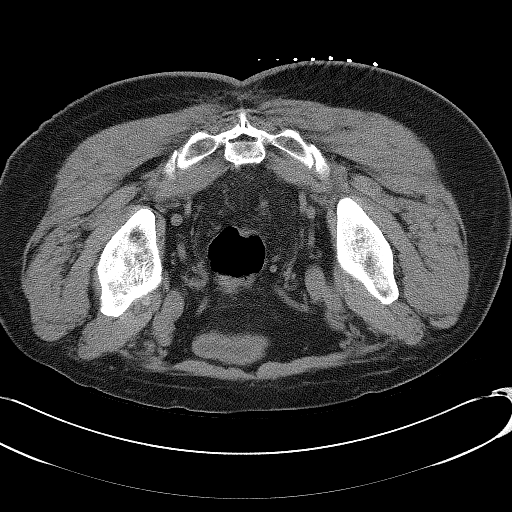
[im 2/25  bone]
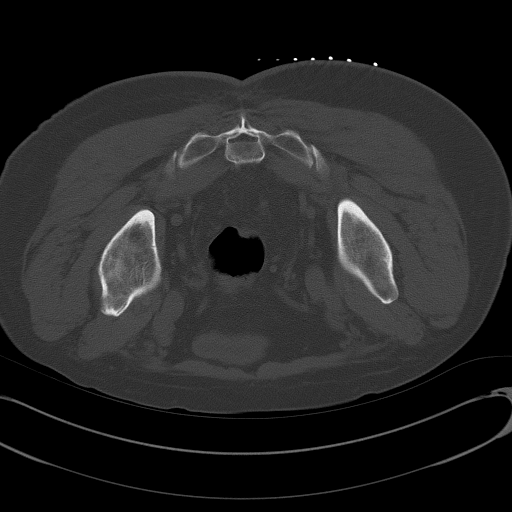
[im 4/25  soft-tissue]
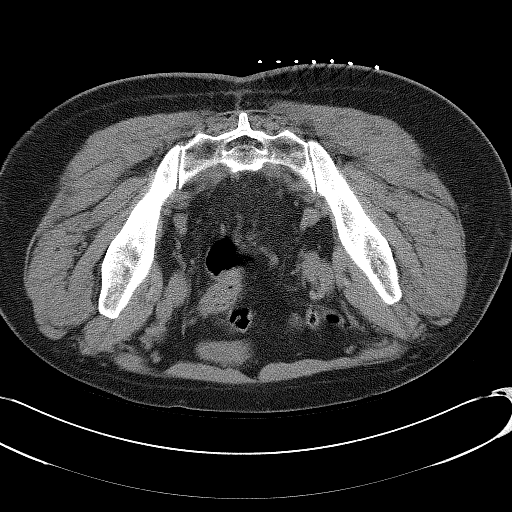
[im 5/25  soft-tissue]
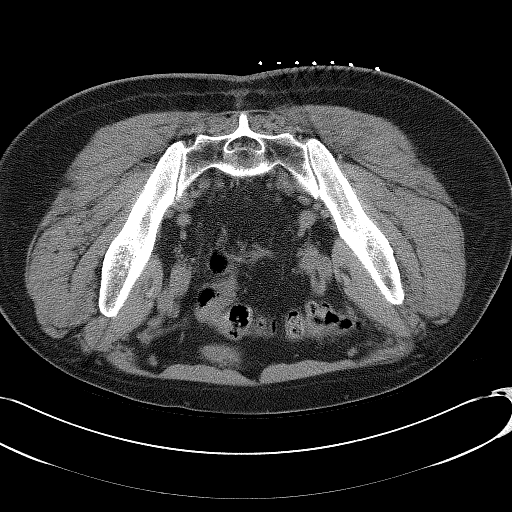
[im 7/25  soft-tissue]
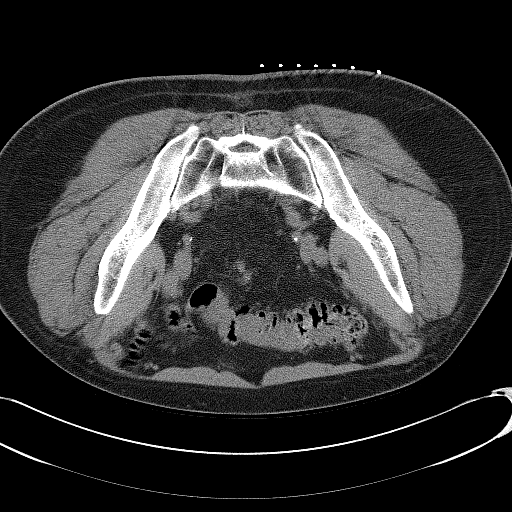
[im 9/25  soft-tissue]
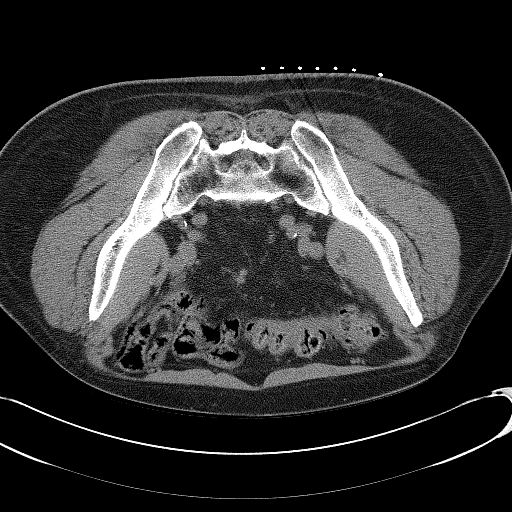
[im 11/25  soft-tissue]
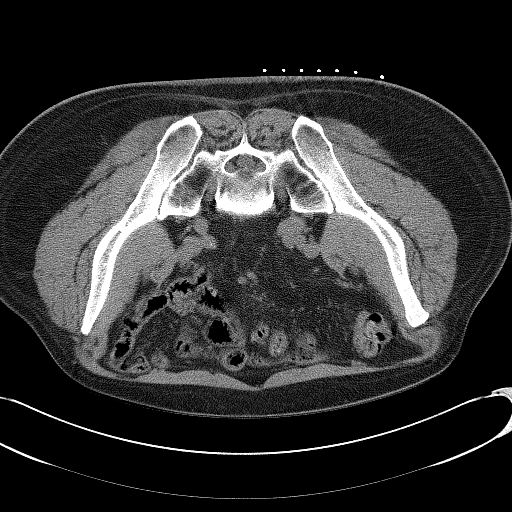
[im 13/25  soft-tissue]
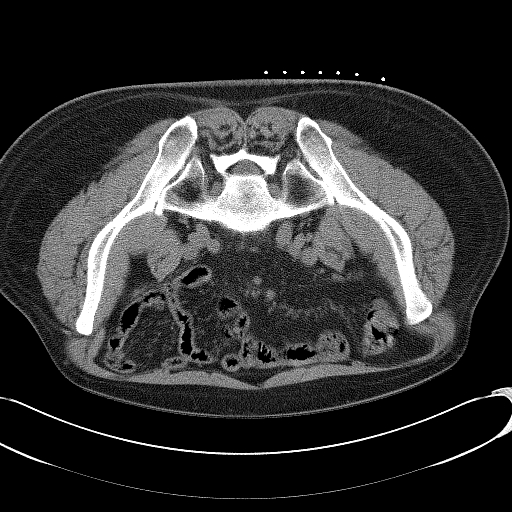
[im 14/25  soft-tissue]
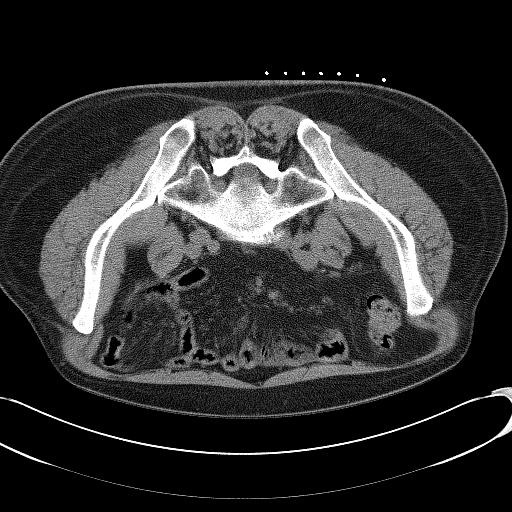
[im 17/25  soft-tissue]
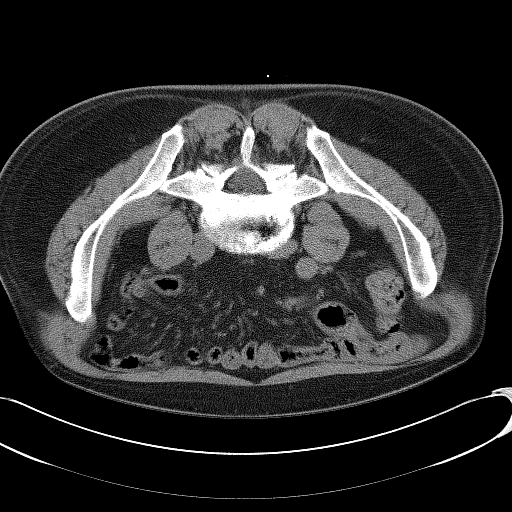
[im 17/25  bone]
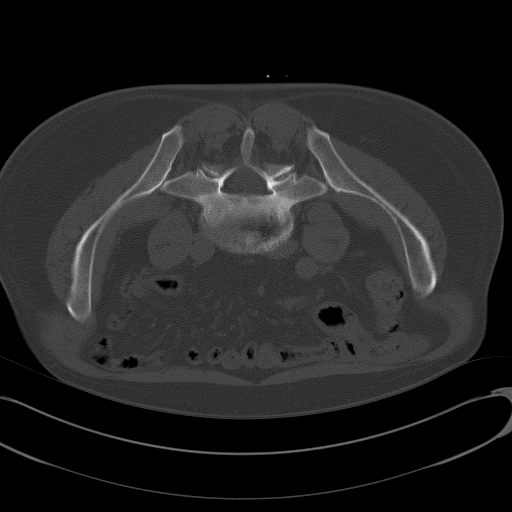
[im 18/25  soft-tissue]
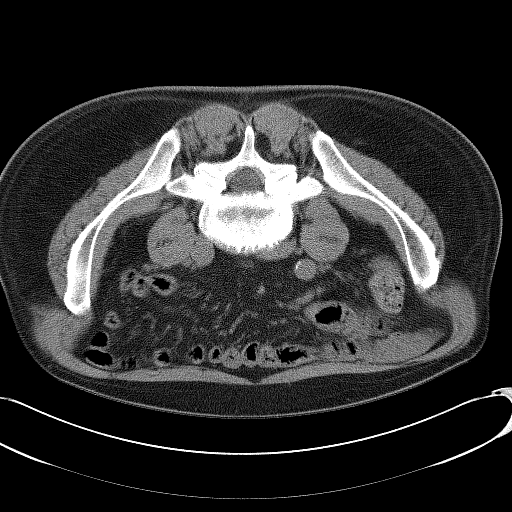
[im 20/25  soft-tissue]
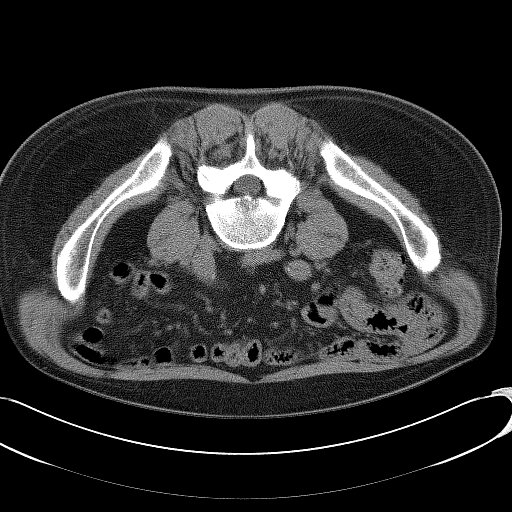
[im 20/25  lung]
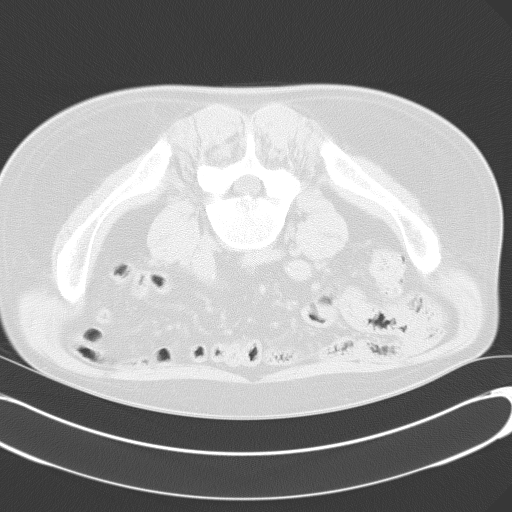
[im 21/25  soft-tissue]
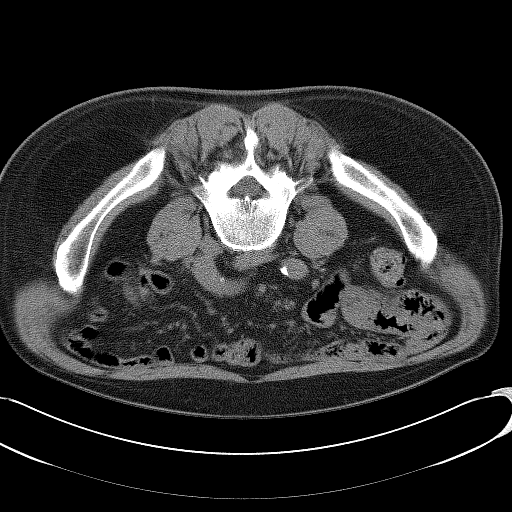
[im 21/25  lung]
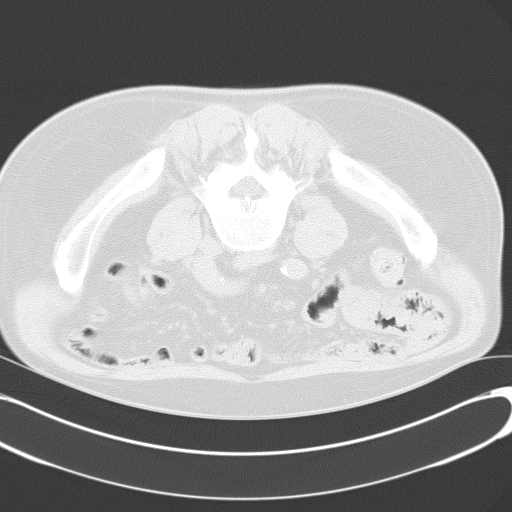
[im 22/25  lung]
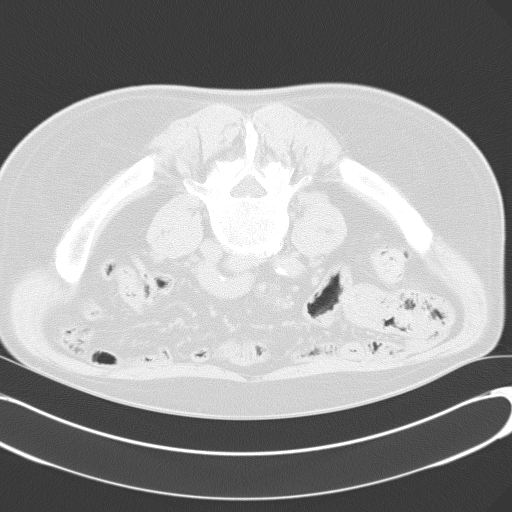
[im 23/25  soft-tissue]
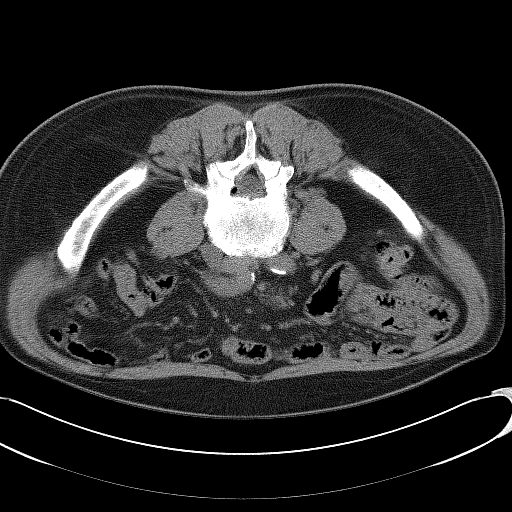
[im 23/25  lung]
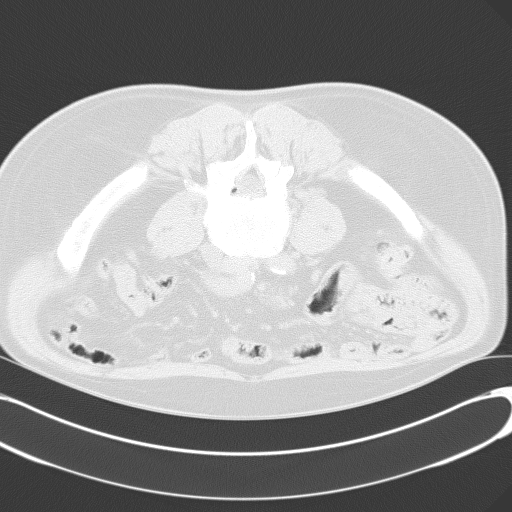

[14 of 32 positions shown; findings below may reference images not displayed]

FINDINGS: The images document guide needle placement within the
left iliac bone.  Post biopsy images demonstrate no hemorrhage.
IMPRESSION: Successful CT-guided bone marrow biopsy.

## 2012-02-11 ENCOUNTER — Other Ambulatory Visit (HOSPITAL_COMMUNITY): Payer: Self-pay | Admitting: Urology

## 2012-02-11 DIAGNOSIS — N281 Cyst of kidney, acquired: Secondary | ICD-10-CM

## 2012-03-02 ENCOUNTER — Encounter (HOSPITAL_COMMUNITY): Payer: Self-pay | Admitting: Pharmacy Technician

## 2012-03-04 ENCOUNTER — Ambulatory Visit (HOSPITAL_COMMUNITY)
Admission: RE | Admit: 2012-03-04 | Discharge: 2012-03-04 | Disposition: A | Discharge status: Home / Self Care | Payer: PRIVATE HEALTH INSURANCE | Source: Ambulatory Visit | Attending: General Surgery | Admitting: General Surgery

## 2012-03-04 ENCOUNTER — Encounter (HOSPITAL_COMMUNITY)
Admission: RE | Admit: 2012-03-04 | Discharge: 2012-03-04 | Disposition: A | Discharge status: Home / Self Care | Payer: PRIVATE HEALTH INSURANCE | Source: Ambulatory Visit | Attending: General Surgery | Admitting: General Surgery

## 2012-03-04 ENCOUNTER — Encounter (HOSPITAL_COMMUNITY): Payer: Self-pay

## 2012-03-04 DIAGNOSIS — Z01818 Encounter for other preprocedural examination: Secondary | ICD-10-CM | POA: Insufficient documentation

## 2012-03-04 DIAGNOSIS — Z01810 Encounter for preprocedural cardiovascular examination: Secondary | ICD-10-CM | POA: Insufficient documentation

## 2012-03-04 DIAGNOSIS — Z01812 Encounter for preprocedural laboratory examination: Secondary | ICD-10-CM | POA: Insufficient documentation

## 2012-03-04 HISTORY — DX: Benign prostatic hyperplasia without lower urinary tract symptoms: N40.0

## 2012-03-04 HISTORY — DX: Unspecified osteoarthritis, unspecified site: M19.90

## 2012-03-04 HISTORY — DX: Umbilical hernia without obstruction or gangrene: K42.9

## 2012-03-04 LAB — CBC
HCT: 42.6 % (ref 39.0–52.0)
Hemoglobin: 15.4 g/dL (ref 13.0–17.0)
MCH: 36.2 pg — ABNORMAL HIGH (ref 26.0–34.0)
MCV: 100.2 fL — ABNORMAL HIGH (ref 78.0–100.0)
Platelets: 71 10*3/uL — ABNORMAL LOW (ref 150–400)
RBC: 4.25 MIL/uL (ref 4.22–5.81)
WBC: 4.3 10*3/uL (ref 4.0–10.5)

## 2012-03-04 LAB — APTT: aPTT: 30 seconds (ref 24–37)

## 2012-03-04 LAB — COMPREHENSIVE METABOLIC PANEL
AST: 63 U/L — ABNORMAL HIGH (ref 0–37)
CO2: 28 mEq/L (ref 19–32)
Calcium: 9 mg/dL (ref 8.4–10.5)
Chloride: 104 mEq/L (ref 96–112)
Creatinine, Ser: 0.56 mg/dL (ref 0.50–1.35)
GFR calc Af Amer: >90 mL/min (ref >90)
GFR calc non Af Amer: >90 mL/min (ref >90)
Glucose, Bld: 259 mg/dL — ABNORMAL HIGH (ref 70–99)
Total Bilirubin: 2.2 mg/dL — ABNORMAL HIGH (ref 0.3–1.2)

## 2012-03-04 LAB — PROTIME-INR: INR: 1.06 (ref 0.00–1.49)

## 2012-03-04 MED ORDER — CHLORHEXIDINE GLUCONATE 4 % EX LIQD: 1.0000 "application " | Freq: Once | CUTANEOUS | Status: DC

## 2012-03-04 NOTE — Progress Notes (Signed)
Dr. Johna Sheriff,              Please review pt's preop CBC and CMET reports for abnormals -platelet count 71,000, glucose 259.                           Thanks.

## 2012-03-04 NOTE — Patient Instructions (Signed)
YOUR SURGERY IS SCHEDULED AT [facility]  ON:  Wednesday  12 / 11  REPORT TO [place] [facility] AT:  12:00 PM      PHONE # FOR SHORT STAY IS [phone]  DO NOT EAT OR DRINK ANYTHING AFTER MIDNIGHT THE NIGHT BEFORE YOUR SURGERY.  YOU MAY BRUSH YOUR TEETH, RINSE OUT YOUR MOUTH--BUT NO WATER, NO FOOD, NO CHEWING GUM, NO MINTS, NO CANDIES, NO CHEWING TOBACCO.  PLEASE TAKE THE FOLLOWING MEDICATIONS THE AM OF YOUR SURGERY WITH A FEW SIPS OF WATER:  FINASTERIDE AND FLOMAX  IF YOU USE INHALERS--USE YOUR INHALERS THE AM OF YOUR SURGERY AND BRING INHALERS TO THE HOSPITAL -TAKE TO SURGERY.    IF YOU ARE DIABETIC:  DO NOT TAKE ANY DIABETIC MEDICATIONS THE AM OF YOUR SURGERY.  IF YOU TAKE INSULIN IN THE EVENINGS--PLEASE ONLY TAKE 1/2 NORMAL EVENING DOSE THE NIGHT BEFORE YOUR SURGERY.  NO INSULIN THE AM OF YOUR SURGERY.  IF YOU HAVE SLEEP APNEA AND USE CPAP OR BIPAP--PLEASE BRING THE MASK AND THE TUBING.  DO NOT BRING YOUR MACHINE.  DO NOT BRING VALUABLES, MONEY, CREDIT CARDS.  DO NOT WEAR JEWELRY, MAKE-UP, NAIL POLISH AND NO METAL PINS OR CLIPS IN YOUR HAIR. CONTACT LENS, DENTURES / PARTIALS, GLASSES SHOULD NOT BE WORN TO SURGERY AND IN MOST CASES-HEARING AIDS WILL NEED TO BE REMOVED.  BRING YOUR GLASSES CASE, ANY EQUIPMENT NEEDED FOR YOUR CONTACT LENS. FOR PATIENTS ADMITTED TO THE HOSPITAL--CHECK OUT TIME THE DAY OF DISCHARGE IS 11:00 AM.  ALL INPATIENT ROOMS ARE PRIVATE - WITH BATHROOM, TELEPHONE, TELEVISION AND WIFI INTERNET.  IF YOU ARE BEING DISCHARGED THE SAME DAY OF YOUR SURGERY--YOU CAN NOT DRIVE YOURSELF HOME--AND SHOULD NOT GO HOME ALONE BY TAXI OR BUS.  NO DRIVING OR OPERATING MACHINERY FOR 24 HOURS FOLLOWING ANESTHESIA / PAIN MEDICATIONS.  PLEASE MAKE ARRANGEMENTS FOR SOMEONE TO BE WITH YOU AT HOME THE FIRST 24 HOURS AFTER SURGERY. RESPONSIBLE DRIVER'S NAME  PT'S SON JOSH Caniglia - WILL BE WITH PT                                               PHONE #   [phone]                   PLEASE READ OVER ANY  FACT SHEETS THAT YOU WERE GIVEN: MRSA INFORMATION, BLOOD TRANSFUSION INFORMATION, INCENTIVE SPIROMETER INFORMATION. FAILURE TO FOLLOW THESE INSTRUCTIONS MAY RESULT IN THE CANCELLATION OF YOUR SURGERY.   PATIENT SIGNATURE_________________________________

## 2012-03-04 NOTE — Pre-Procedure Instructions (Signed)
PT'S PLATELET CT 71,000, GLUCOSE 258.  PROGRESS NOTE FAXED IN EPIC TO DR. HOXWORTH TO PLEASE REVIEW PT'S ABNORMAL CBC AND BMET REPORTS.

## 2012-03-04 NOTE — Pre-Procedure Instructions (Addendum)
PREOP CBC, CMET, PT, PTT, EKG AND CXR WERE DONE AS PER ANESTHESIOLOGIST'S GUIDELINES. PT'S ECHO REPORT 11/27/11 ON CHART FROM [facility].

## 2012-03-08 NOTE — Pre-Procedure Instructions (Signed)
SPOKE WITH CHRISTIE AT DR. HOXWORTH'S OFFICE REGARDING PT'S LOW PLATELET COUNT AND I DO NOT SEE THAT DR. HOXWORTH HAS REVIEWED --PT DOES HAVE HX OF LOW PLATELETS--BUT SHE WILL SEND ANOTHER MESSAGE TO DR. HOXWORTH TO REVIEW.

## 2012-03-09 ENCOUNTER — Encounter (HOSPITAL_COMMUNITY)
Admission: RE | Disposition: A | Discharge status: Home / Self Care | Payer: Self-pay | Source: Ambulatory Visit | Attending: General Surgery

## 2012-03-09 ENCOUNTER — Ambulatory Visit (HOSPITAL_COMMUNITY): Payer: PRIVATE HEALTH INSURANCE | Admitting: Anesthesiology

## 2012-03-09 ENCOUNTER — Encounter (HOSPITAL_COMMUNITY): Payer: Self-pay | Admitting: *Deleted

## 2012-03-09 ENCOUNTER — Encounter (HOSPITAL_COMMUNITY): Payer: Self-pay | Admitting: Anesthesiology

## 2012-03-09 ENCOUNTER — Ambulatory Visit (HOSPITAL_COMMUNITY)
Admission: RE | Admit: 2012-03-09 | Discharge: 2012-03-09 | Disposition: A | Discharge status: Home / Self Care | Payer: PRIVATE HEALTH INSURANCE | Source: Ambulatory Visit | Attending: General Surgery | Admitting: General Surgery

## 2012-03-09 DIAGNOSIS — I10 Essential (primary) hypertension: Secondary | ICD-10-CM | POA: Insufficient documentation

## 2012-03-09 DIAGNOSIS — K429 Umbilical hernia without obstruction or gangrene: Secondary | ICD-10-CM

## 2012-03-09 DIAGNOSIS — R21 Rash and other nonspecific skin eruption: Secondary | ICD-10-CM | POA: Insufficient documentation

## 2012-03-09 DIAGNOSIS — E119 Type 2 diabetes mellitus without complications: Secondary | ICD-10-CM | POA: Insufficient documentation

## 2012-03-09 HISTORY — PX: INSERTION OF MESH: SHX5868

## 2012-03-09 HISTORY — PX: UMBILICAL HERNIA REPAIR: SHX196

## 2012-03-09 LAB — GLUCOSE, CAPILLARY
Glucose-Capillary: 205 mg/dL — ABNORMAL HIGH (ref 70–99)
Glucose-Capillary: 198 mg/dL — ABNORMAL HIGH (ref 70–99)

## 2012-03-09 SURGERY — REPAIR, HERNIA, UMBILICAL, ADULT
Anesthesia: General | Wound class: Clean

## 2012-03-09 MED ORDER — SUCCINYLCHOLINE CHLORIDE 20 MG/ML IJ SOLN
INTRAMUSCULAR | Status: DC | PRN
  Administered 2012-03-09: 100 mg via INTRAVENOUS

## 2012-03-09 MED ORDER — PROMETHAZINE HCL 25 MG/ML IJ SOLN: 6.2500 mg | INTRAMUSCULAR | Status: DC | PRN

## 2012-03-09 MED ORDER — PROPOFOL 10 MG/ML IV EMUL
INTRAVENOUS | Status: DC | PRN
  Administered 2012-03-09: 170 mg via INTRAVENOUS
  Administered 2012-03-09: 30 mg via INTRAVENOUS

## 2012-03-09 MED ORDER — ONDANSETRON HCL 4 MG/2ML IJ SOLN
INTRAMUSCULAR | Status: DC | PRN
  Administered 2012-03-09: 4 mg via INTRAVENOUS

## 2012-03-09 MED ORDER — CEFAZOLIN SODIUM-DEXTROSE 2-3 GM-% IV SOLR
2.0000 g | INTRAVENOUS | Status: AC
  Administered 2012-03-09: 2 g via INTRAVENOUS

## 2012-03-09 MED ORDER — PHENYLEPHRINE HCL 10 MG/ML IJ SOLN
INTRAMUSCULAR | Status: DC | PRN
  Administered 2012-03-09 (×2): 40 ug via INTRAVENOUS
  Administered 2012-03-09: 80 ug via INTRAVENOUS

## 2012-03-09 MED ORDER — LIDOCAINE HCL 1 % IJ SOLN
INTRAMUSCULAR | Status: DC | PRN
  Administered 2012-03-09: 80 mg via INTRADERMAL

## 2012-03-09 MED ORDER — OXYCODONE-ACETAMINOPHEN 5-325 MG PO TABS: 1.0000 | ORAL_TABLET | ORAL | Status: DC | PRN

## 2012-03-09 MED ORDER — LACTATED RINGERS IV SOLN: INTRAVENOUS | Status: DC

## 2012-03-09 MED ORDER — MEPERIDINE HCL 50 MG/ML IJ SOLN: 6.2500 mg | INTRAMUSCULAR | Status: DC | PRN

## 2012-03-09 MED ORDER — FENTANYL CITRATE 0.05 MG/ML IJ SOLN: 25.0000 ug | INTRAMUSCULAR | Status: DC | PRN

## 2012-03-09 MED ORDER — LACTATED RINGERS IV SOLN
INTRAVENOUS | Status: DC | PRN
  Administered 2012-03-09 (×2): via INTRAVENOUS

## 2012-03-09 MED ORDER — BUPIVACAINE-EPINEPHRINE PF 0.25-1:200000 % IJ SOLN
INTRAMUSCULAR | Status: AC
  Filled 2012-03-09: qty 30

## 2012-03-09 MED ORDER — MIDAZOLAM HCL 5 MG/5ML IJ SOLN
INTRAMUSCULAR | Status: DC | PRN
  Administered 2012-03-09: 2 mg via INTRAVENOUS

## 2012-03-09 MED ORDER — CEFAZOLIN SODIUM-DEXTROSE 2-3 GM-% IV SOLR
INTRAVENOUS | Status: AC
  Filled 2012-03-09: qty 50

## 2012-03-09 MED ORDER — EPHEDRINE SULFATE 50 MG/ML IJ SOLN
INTRAMUSCULAR | Status: DC | PRN
  Administered 2012-03-09 (×2): 10 mg via INTRAVENOUS

## 2012-03-09 MED ORDER — FENTANYL CITRATE 0.05 MG/ML IJ SOLN
INTRAMUSCULAR | Status: DC | PRN
  Administered 2012-03-09 (×2): 50 ug via INTRAVENOUS
  Administered 2012-03-09: 25 ug via INTRAVENOUS
  Administered 2012-03-09: 100 ug via INTRAVENOUS

## 2012-03-09 MED ORDER — BUPIVACAINE-EPINEPHRINE 0.25% -1:200000 IJ SOLN
INTRAMUSCULAR | Status: DC | PRN
  Administered 2012-03-09: 30 mL

## 2012-03-09 MED ORDER — 0.9 % SODIUM CHLORIDE (POUR BTL) OPTIME
TOPICAL | Status: DC | PRN
  Administered 2012-03-09: 1000 mL

## 2012-03-09 SURGICAL SUPPLY — 36 items
BENZOIN TINCTURE PRP APPL 2/3 (GAUZE/BANDAGES/DRESSINGS) IMPLANT
BLADE HEX COATED 2.75 (ELECTRODE) ×2 IMPLANT
BLADE SURG SZ10 CARB STEEL (BLADE) ×2 IMPLANT
CLOTH BEACON ORANGE TIMEOUT ST (SAFETY) ×2 IMPLANT
DECANTER SPIKE VIAL GLASS SM (MISCELLANEOUS) IMPLANT
DERMABOND ADVANCED (GAUZE/BANDAGES/DRESSINGS) ×2
DERMABOND ADVANCED .7 DNX12 (GAUZE/BANDAGES/DRESSINGS) ×2 IMPLANT
DRAPE LAPAROTOMY T 102X78X121 (DRAPES) ×2 IMPLANT
ELECT REM PT RETURN 9FT ADLT (ELECTROSURGICAL) ×2
ELECTRODE REM PT RTRN 9FT ADLT (ELECTROSURGICAL) ×1 IMPLANT
GLOVE BIOGEL PI IND STRL 7.0 (GLOVE) ×1 IMPLANT
GLOVE BIOGEL PI INDICATOR 7.0 (GLOVE) ×1
GOWN STRL NON-REIN LRG LVL3 (GOWN DISPOSABLE) IMPLANT
GOWN STRL REIN XL XLG (GOWN DISPOSABLE) ×4 IMPLANT
KIT BASIN OR (CUSTOM PROCEDURE TRAY) ×2 IMPLANT
MARKER SKIN DUAL TIP RULER LAB (MISCELLANEOUS) IMPLANT
MESH VENTRALEX ST 2.5 CRC MED (Mesh General) ×2 IMPLANT
NEEDLE HYPO 22GX1.5 SAFETY (NEEDLE) ×2 IMPLANT
NEEDLE HYPO 25X1 1.5 SAFETY (NEEDLE) IMPLANT
NS IRRIG 1000ML POUR BTL (IV SOLUTION) ×2 IMPLANT
PACK BASIC VI WITH GOWN DISP (CUSTOM PROCEDURE TRAY) ×2 IMPLANT
PENCIL BUTTON HOLSTER BLD 10FT (ELECTRODE) ×2 IMPLANT
SPONGE GAUZE 4X4 12PLY (GAUZE/BANDAGES/DRESSINGS) IMPLANT
SPONGE LAP 4X18 X RAY DECT (DISPOSABLE) ×2 IMPLANT
STRIP CLOSURE SKIN 1/2X4 (GAUZE/BANDAGES/DRESSINGS) IMPLANT
SUT MNCRL AB 4-0 PS2 18 (SUTURE) ×2 IMPLANT
SUT PROLENE 0 CT 1 30 (SUTURE) IMPLANT
SUT PROLENE 0 CT 1 CR/8 (SUTURE) ×2 IMPLANT
SUT PROLENE 0 CT 2 (SUTURE) IMPLANT
SUT SILK 3 0 (SUTURE)
SUT SILK 3 0 SH CR/8 (SUTURE) IMPLANT
SUT SILK 3-0 18XBRD TIE 12 (SUTURE) IMPLANT
SUT VIC AB 3-0 SH 27 (SUTURE) ×1
SUT VIC AB 3-0 SH 27XBRD (SUTURE) ×1 IMPLANT
SYR CONTROL 10ML LL (SYRINGE) ×2 IMPLANT
TOWEL OR 17X26 10 PK STRL BLUE (TOWEL DISPOSABLE) ×2 IMPLANT

## 2012-03-09 NOTE — Addendum Note (Signed)
Addendum  created 03/09/12 1610 by Fabienne Bruns, RN   Modules edited:Charges VN

## 2012-03-09 NOTE — Anesthesia Postprocedure Evaluation (Signed)
  Anesthesia Post-op Note  Patient: Ethan Wade  Procedure(s) Performed: Procedure(s) (LRB): HERNIA REPAIR UMBILICAL ADULT (N/A) INSERTION OF MESH (N/A)  Patient Location: PACU  Anesthesia Type: General  Level of Consciousness: awake and alert   Airway and Oxygen Therapy: Patient Spontanous Breathing  Post-op Pain: mild  Post-op Assessment: Post-op Vital signs reviewed, Patient's Cardiovascular Status Stable, Respiratory Function Stable, Patent Airway and No signs of Nausea or vomiting  Last Vitals:  Filed Vitals:   03/09/12 1415  BP: 126/72  Pulse: 76  Temp:   Resp: 15    Post-op Vital Signs: stable   Complications: dental injury. Upper front cap dislodged during removal of LMA prior to intubation. Underlying tooth appears quite decayed. Cap recovered and saved. Given to patient. I explained what happened in detail to the patient in the PACU. I recommended that he see his dentist to have the cap potentially reattached.

## 2012-03-09 NOTE — Op Note (Signed)
Preoperative Diagnosis: umbilical hernia  Postoprative Diagnosis: umbilical hernia  Procedure: Procedure(s): HERNIA REPAIR UMBILICAL ADULT INSERTION OF MESH   Surgeon: Glenna Fellows T   Assistants: None  Anesthesia:  General LMA anesthesiaDiagnos  Indications:   Patient is a 61 year old male who initially presented to the emergency room with acute pain and swelling a long-standing umbilical hernia. There was evidence of small bowel obstruction but his symptoms and physical findings quickly resolved and he was referred for elective repair. Examination reveals a mildly tender reducible several centimeter mass at the umbilicus. I recommended open repair with mesh under general anesthesia. The indication for the procedure and risks of anesthetic complications, bleeding, infection and recurrence were discussed and understood. Several the operating room for this procedure.  Procedure Detail:  Patient is brought to the operating room and placed in the supine position on the operating table. LMA general anesthesia was induced. Anesthesia informed me that a loose cap on his front tooth was dislodged during placement of the LMA. He received preoperative IV antibiotics. The abdomen was widely sterilely prepped and draped. Patient timeout was performed and correct procedure verified. A curvilinear incision was made just beneath the umbilicus transversely and dissection carried down through the subcutaneous tissue. The hernia sac was identified and dissected completely away from the umbilical skin and surrounding soft tissue and freed down to the fascial defect. The sac was opened and there was no incarcerated tissue. The sac was completely excised at the level of the fascia with cautery. The defect in the fascia measured only about a centimeter. The surrounding fascia appeared healthy. I used a Bard 6 cm dual sided umbilical hernia patch. This was curled and introduced into the peritoneal cavity and  then uncurled and establish that it was completely flat and deployed nicely up against the fascia in all directions. Interrupted 0 Prolene was then used to close the fascial defect over the patch in a transverse fashion incorporating the tails of the mesh into the closure which were then trimmed away. The soft tissue was infiltrated with Marcaine. The wound was irrigated. The subcutaneous was closed with interrupted 3-0 Vicryl and the skin with subcuticular 4-0 Monocryl and Dermabond. Sponge needle and ischemic counts were correct.   Estimated Blood Loss:  Minimal         Drains: none  Blood Given: none          Specimens: none        Complications:  * No complications entered in OR log *         Disposition: PACU - hemodynamically stable.         Condition: stable Mariella Saa MD, FACS  03/09/2012, 2:10 PM

## 2012-03-09 NOTE — Transfer of Care (Signed)
Immediate Anesthesia Transfer of Care Note  Patient: Ethan Wade  Procedure(s) Performed: Procedure(s) (LRB) with comments: HERNIA REPAIR UMBILICAL ADULT (N/A) - Repair Umbilical Hernia with Mesh INSERTION OF MESH (N/A)  Patient Location: PACU  Anesthesia Type:General  Level of Consciousness: awake, alert , oriented, patient cooperative and responds to stimulation  Airway & Oxygen Therapy: Patient Spontanous Breathing and Patient connected to face mask oxygen  Post-op Assessment: Report given to PACU RN, Post -op Vital signs reviewed and stable and Patient moving all extremities X 4  Post vital signs: Reviewed and stable  Complications: No apparent anesthesia complications

## 2012-03-09 NOTE — H&P (Signed)
HPI  Patient is a 61 year old male followed by Dr. Zachery Dauer and referred by Marlon Pel PA at the emergency room for an umbilical hernia. The patient has had a known umbilical hernia for some time but it has not really been at all symptomatic. However several days ago he developed acute pain in his umbilicus with some referral around to his abdomen in general any noted the hernia was hard and swollen. He presented to the emergency room. He did not have any nausea or vomiting. The hernia was reduced in the emergency room with relief of his symptoms. He did have a KUB performed which showed early partial small bowel obstruction. Also noted were some calcifications in the right upper quadrant probable gallstones. He has not had epigastric or right upper quadrant or postprandial symptoms or any other episodes of abdominal pain to suggest symptomatic gallbladder disease. The pain at this episode was centered around his umbilicus. He presents now for umbilical hernia repair Past Medical History   Diagnosis  Date   .  Macrocytosis    .  Thrombocytopenia    .  Hepatitis C  1980's   .  Esophageal bleed, non-variceal  2006   .  Diabetes mellitus    .  HTN (hypertension)    .  Psoriasis    .  Mitral valve prolapse    .  Vitamin d deficiency    .  EtOH dependence     Past Surgical History   Procedure  Date   .  Tonsilectomy, adenoidectomy, bilateral myringotomy and tubes  child   .  Inguinal hernia repair  1970's     left    Current Outpatient Prescriptions   Medication  Sig  Dispense  Refill   .  ibuprofen (ADVIL,MOTRIN) 200 MG tablet  Take 600 mg by mouth every 6 (six) hours as needed. pain     .  nadolol (CORGARD) 20 MG tablet  Take 20 mg by mouth at bedtime.     .  ondansetron (ZOFRAN) 4 MG tablet  Take 1 tablet (4 mg total) by mouth every 6 (six) hours.  12 tablet  0   .  oxyCODONE-acetaminophen (PERCOCET/ROXICET) 5-325 MG per tablet  Take 1 tablet by mouth every 6 (six) hours as needed for  pain.  15 tablet  0   .  Tamsulosin HCl (FLOMAX) 0.4 MG CAPS  Take 0.4 mg by mouth daily.     .  Vitamin D, Ergocalciferol, (DRISDOL) 50000 UNITS CAPS  Take 50,000 Units by mouth every 7 (seven) days. On fridays     .  zolpidem (AMBIEN) 10 MG tablet  Take 10 mg by mouth at bedtime as needed. SLEEP      No Known Allergies  History   Substance Use Topics   .  Smoking status:  Former Smoker     Start date:  12/22/2005   .  Smokeless tobacco:  Not on file   .  Alcohol Use:  1.0 oz/week     2 drink(s) per week    Review of Systems  Respiratory: Negative.  Cardiovascular: Negative.  Gastrointestinal: Positive for abdominal pain. Negative for nausea, vomiting, diarrhea and constipation.  Skin: Positive for rash.    Objective:   Physical Exam  BP 133/76  Pulse 61  Temp 97.8 F (36.6 C)  Resp 20  SpO2 96%  General: Well-appearing Caucasian male in no distress  Skin: Scattered scaly rash over his trunk and extremities.  HEENT: No palpable masses  or thyromegaly. Sclera nonicteric. Oropharynx clear.  Lungs: Clear equal breath sounds bilaterally  Cardiovascular: Regular rate and rhythm. No edema. No murmurs.  Abdomen: Nondistended. There is a moderate diastases. Well-healed left inguinal scar. There is a discrete probably 1-2 cm umbilical hernia that is slightly tender but reducible. There is probable splenomegaly. Liver is nonpalpable. No apparent ascites.  Extremities: No joint swelling or edema  Neurologic: alert and fully oriented and affect normal. Gait normal.   Assessment:    Umbilical hernia with apparent recent episode of incarceration and partial small bowel obstruction. This clearly needs to be repaired as he is at risk for further episodes of incarceration. Also noted were probable gallstones on his plain abdominal x-rays but I cannot elicit any symptoms to suggest biliary tract pain. For this reason it particularly in light of his hepatitis and history of esophageal bleed I  would recommend only repair of his umbilical hernia at this time. I recommended an open approach with a mesh patch to be done as an outpatient under general anesthesia. We discussed the indications for the procedure and risks of general anesthesia, bleeding, infection, rare risk of intestinal injury small bowel obstruction and risk of recurrence. All his questions were answered.   Plan:    Open repair of umbilical hernia as an outpatient under general anesthesia with mesh.

## 2012-03-09 NOTE — Anesthesia Preprocedure Evaluation (Addendum)
Anesthesia Evaluation  Patient identified by MRN, date of birth, ID band Patient awake    Reviewed: Allergy & Precautions, H&P , NPO status , Patient's Chart, lab work & pertinent test results  Airway Mallampati: II TM Distance: >3 FB Neck ROM: Full    Dental No notable dental hx.    Pulmonary neg pulmonary ROS,  breath sounds clear to auscultation  Pulmonary exam normal       Cardiovascular hypertension, Pt. on medications negative cardio ROS  + Valvular Problems/Murmurs MVP Rhythm:Regular Rate:Normal     Neuro/Psych negative neurological ROS  negative psych ROS   GI/Hepatic negative GI ROS, Neg liver ROS, (+) Hepatitis -, C  Endo/Other  negative endocrine ROSdiabetes  Renal/GU negative Renal ROS  negative genitourinary   Musculoskeletal negative musculoskeletal ROS (+)   Abdominal   Peds negative pediatric ROS (+)  Hematology negative hematology ROS (+)   Anesthesia Other Findings Upper front caps  Reproductive/Obstetrics negative OB ROS                          Anesthesia Physical Anesthesia Plan  ASA: III  Anesthesia Plan: General   Post-op Pain Management:    Induction: Intravenous  Airway Management Planned: Oral ETT  Additional Equipment:   Intra-op Plan:   Post-operative Plan: Extubation in OR  Informed Consent: I have reviewed the patients History and Physical, chart, labs and discussed the procedure including the risks, benefits and alternatives for the proposed anesthesia with the patient or authorized representative who has indicated his/her understanding and acceptance.   Dental advisory given  Plan Discussed with: CRNA  Anesthesia Plan Comments:         Anesthesia Quick Evaluation

## 2012-03-10 ENCOUNTER — Encounter (HOSPITAL_COMMUNITY): Payer: Self-pay | Admitting: General Surgery

## 2012-04-07 ENCOUNTER — Other Ambulatory Visit (HOSPITAL_COMMUNITY): Payer: Self-pay | Admitting: *Deleted

## 2012-04-08 ENCOUNTER — Ambulatory Visit (HOSPITAL_COMMUNITY)
Admission: RE | Admit: 2012-04-08 | Discharge: 2012-04-08 | Disposition: A | Discharge status: Home / Self Care | Payer: PRIVATE HEALTH INSURANCE | Source: Ambulatory Visit | Attending: Urology | Admitting: Urology

## 2012-04-08 DIAGNOSIS — R188 Other ascites: Secondary | ICD-10-CM | POA: Insufficient documentation

## 2012-04-08 DIAGNOSIS — N281 Cyst of kidney, acquired: Secondary | ICD-10-CM

## 2012-04-08 DIAGNOSIS — Q619 Cystic kidney disease, unspecified: Secondary | ICD-10-CM | POA: Insufficient documentation

## 2012-04-08 DIAGNOSIS — R161 Splenomegaly, not elsewhere classified: Secondary | ICD-10-CM | POA: Insufficient documentation

## 2012-04-08 DIAGNOSIS — K8020 Calculus of gallbladder without cholecystitis without obstruction: Secondary | ICD-10-CM | POA: Insufficient documentation

## 2012-04-08 DIAGNOSIS — K7460 Unspecified cirrhosis of liver: Secondary | ICD-10-CM | POA: Insufficient documentation

## 2012-04-08 MED ORDER — GADOBENATE DIMEGLUMINE 529 MG/ML IV SOLN
18.0000 mL | Freq: Once | INTRAVENOUS | Status: AC | PRN
  Administered 2012-04-08: 18 mL via INTRAVENOUS

## 2012-04-22 ENCOUNTER — Ambulatory Visit: Payer: PRIVATE HEALTH INSURANCE | Admitting: Oncology

## 2012-04-22 ENCOUNTER — Other Ambulatory Visit: Payer: PRIVATE HEALTH INSURANCE | Admitting: Lab

## 2012-04-25 ENCOUNTER — Ambulatory Visit: Payer: PRIVATE HEALTH INSURANCE | Admitting: Oncology

## 2012-04-26 ENCOUNTER — Other Ambulatory Visit: Payer: Self-pay | Admitting: Oncology

## 2012-04-26 DIAGNOSIS — D696 Thrombocytopenia, unspecified: Secondary | ICD-10-CM

## 2012-04-27 ENCOUNTER — Ambulatory Visit (HOSPITAL_BASED_OUTPATIENT_CLINIC_OR_DEPARTMENT_OTHER): Payer: PRIVATE HEALTH INSURANCE | Admitting: Oncology

## 2012-04-27 ENCOUNTER — Other Ambulatory Visit (HOSPITAL_BASED_OUTPATIENT_CLINIC_OR_DEPARTMENT_OTHER): Payer: PRIVATE HEALTH INSURANCE | Admitting: Lab

## 2012-04-27 VITALS — BP 129/77 | HR 67 | Temp 97.1°F | Resp 18 | Ht 70.5 in | Wt 182.2 lb

## 2012-04-27 DIAGNOSIS — B1920 Unspecified viral hepatitis C without hepatic coma: Secondary | ICD-10-CM

## 2012-04-27 DIAGNOSIS — D696 Thrombocytopenia, unspecified: Secondary | ICD-10-CM

## 2012-04-27 DIAGNOSIS — L408 Other psoriasis: Secondary | ICD-10-CM

## 2012-04-27 LAB — CBC WITH DIFFERENTIAL/PLATELET
Eosinophils Absolute: 0.1 10*3/uL (ref 0.0–0.5)
HGB: 16.2 g/dL (ref 13.0–17.1)
MONO#: 0.4 10*3/uL (ref 0.1–0.9)
NEUT#: 2.4 10*3/uL (ref 1.5–6.5)
Platelets: 69 10*3/uL — ABNORMAL LOW (ref 140–400)
RBC: 4.35 10*6/uL (ref 4.20–5.82)
RDW: 12.9 % (ref 11.0–14.6)
WBC: 4 10*3/uL (ref 4.0–10.3)

## 2012-04-27 LAB — COMPREHENSIVE METABOLIC PANEL (CC13)
ALT: 72 U/L — ABNORMAL HIGH (ref 0–55)
Albumin: 3.2 g/dL — ABNORMAL LOW (ref 3.5–5.0)
CO2: 28 mEq/L (ref 22–29)
Calcium: 9.2 mg/dL (ref 8.4–10.4)
Chloride: 105 mEq/L (ref 98–107)
Sodium: 143 mEq/L (ref 136–145)
Total Protein: 6.7 g/dL (ref 6.4–8.3)

## 2012-04-27 LAB — CHCC SMEAR

## 2012-04-27 NOTE — Progress Notes (Signed)
[facility] OFFICE PROGRESS NOTE  Cc:  Gaye Alken, MD  DIAGNOSIS:  Macrocytosis and thrombocytopenia secondary to Hepatitis C;  hematology work up including bone marrow biopsy was negative..  CURRENT TREATMENT:  Patient still prefers to wait before starting treatment for hepatitis C.  INTERVAL HISTORY: Ethan Wade 62 y.o. male returns for regular follow up by himself.  He still has not seen a hepatologist since he is concerned about having to work.  He has diffuse skin rash due to eczema.  He denied bleeding.  He still works full time without fatigue.   Patient denies fever, anorexia, weight loss, fatigue, headache, visual changes, confusion, drenching night sweats, palpable lymph node swelling, mucositis, odynophagia, dysphagia, nausea vomiting, jaundice, chest pain, palpitation, shortness of breath, dyspnea on exertion, productive cough, gum bleeding, epistaxis, hematemesis, hemoptysis, abdominal pain, abdominal swelling, early satiety, melena, hematochezia, hematuria, spontaneous bleeding, joint swelling, joint pain, heat or cold intolerance, bowel bladder incontinence, back pain, focal motor weakness, paresthesia, depression.    MEDICAL HISTORY: Past Medical History  Diagnosis Date  . Macrocytosis   . Thrombocytopenia   . Hepatitis C 1980's  . Esophageal bleed, non-variceal 2006  . HTN (hypertension)   . Psoriasis   . Mitral valve prolapse     OCCAS FLUTTER FEELING  . Vitamin D deficiency   . Diabetes mellitus     DIET CONTROL  . Arthritis   . Umbilical hernia     OCCAS DISCOMFORT  . Prostate enlargement     PT TOLD "NORMAL FOR AGE" - TAKES FINASTERIDE AND FLOMAX    SURGICAL HISTORY:  Past Surgical History  Procedure Date  . Tonsilectomy, adenoidectomy, bilateral myringotomy and tubes child  . Inguinal hernia repair 1970's    left  . Umbilical hernia repair 03/09/2012    Procedure: HERNIA REPAIR UMBILICAL ADULT;  Surgeon: Mariella Saa, MD;  Location: [facility];  Service: General;  Laterality: N/A;  Repair Umbilical Hernia with Mesh  . Insertion of mesh 03/09/2012    Procedure: INSERTION OF MESH;  Surgeon: Mariella Saa, MD;  Location: [facility];  Service: General;  Laterality: N/A;    MEDICATIONS: Current Outpatient Prescriptions  Medication Sig Dispense Refill  . Carboxymethylcellul-Glycerin (CLEAR EYES FOR DRY EYES) 1-0.25 % SOLN Apply 1-2 drops to eye daily as needed. For dry eyes      . Clobetasol Propionate 0.05 % lotion Apply 1 application topically 2 (two) times daily. Apply to affected areas      . finasteride (PROSCAR) 5 MG tablet Take 5 mg by mouth daily.      Marland Kitchen ibuprofen (ADVIL,MOTRIN) 600 MG tablet Take 600 mg by mouth every 6 (six) hours as needed. For pain      . Tamsulosin HCl (FLOMAX) 0.4 MG CAPS Take 0.4 mg by mouth daily.      . Vitamin D, Ergocalciferol, (DRISDOL) 50000 UNITS CAPS Take 50,000 Units by mouth every Friday.      . zolpidem (AMBIEN) 10 MG tablet Take 10 mg by mouth at bedtime as needed. For sleep      . nadolol (CORGARD) 20 MG tablet Take 20 mg by mouth at bedtime.        ALLERGIES:   has no known allergies.  REVIEW OF SYSTEMS:  The rest of the 14-point review of system was negative.   Filed Vitals:   04/27/12 1338  BP: 129/77  Pulse: 67  Temp: 97.1 F (36.2 C)  Resp: 18   Wt Readings  from Last 3 Encounters:  04/27/12 182 lb 3.2 oz (82.645 kg)  03/04/12 193 lb 9.6 oz (87.816 kg)  12/23/11 190 lb (86.183 kg)   ECOG Performance status: 1  PHYSICAL EXAMINATION:   General:  well-nourished in no acute distress.  Eyes:  no scleral icterus.  ENT:  There were no oropharyngeal lesions.  Neck was without thyromegaly.  Lymphatics:  Negative cervical, supraclavicular or axillary adenopathy.  Respiratory: lungs were clear bilaterally without wheezing or crackles.  Cardiovascular:  Regular rate and rhythm, S1/S2, without murmur, rub or gallop.  There was no pedal edema.  GI:   abdomen was soft, flat, nontender, nondistended, without organomegaly.  Muscoloskeletal:  no spinal tenderness of palpation of vertebral spine.  Skin exam showed extensive flaky, erythematous skin rash.   Neuro exam was nonfocal.  Patient was able to get on and off exam table without assistance.  Gait was normal.  Patient was alerted and oriented.  Attention was good.   Language was appropriate.  Mood was normal without depression.  Speech was not pressured.  Thought content was not tangential.         LABORATORY/RADIOLOGY DATA:  Lab Results  Component Value Date   WBC 4.0 04/27/2012   HGB 16.2 04/27/2012   HCT 45.5 04/27/2012   PLT 69* 04/27/2012   GLUCOSE 248* 04/27/2012   ALT 72* 04/27/2012   AST 78* 04/27/2012   NA 143 04/27/2012   K 4.3 04/27/2012   CL 105 04/27/2012   CREATININE 0.8 04/27/2012   BUN 8.4 04/27/2012   CO2 28 04/27/2012   TSH 1.363 10/02/2010   TSH 1.363 10/02/2010   TSH 1.363 10/02/2010   INR 1.06 03/04/2012    ASSESSMENT AND PLAN:    1. Hepatitis C: he sued to see Dr. Jacqualine Mau.  He still wants to observe.  I educated him the risk of prolonged untreated hepC which include but not limited to cirrhosis, hepatocellular carcinoma.  2. Thrombocytopenia:  Due to HepC and possible component of portal hypertension.  His plt count has been stable for the past two years.  Extensive hematologic work up has been negative.  I again recommended watchful observation.  I discharged him from the Cancer Center to follow up with his PCP.  I recommended twice yearly CBC.  In the future, if he develops significant pancytopenia despite treatment for HepC and there was no cirrhosis, he can be referred to the Cancer Center again for potential further work up.  3. Psoriasis:  He is not symptomatic, and he does not want any treatment at this time.   Thank you for this referral.

## 2012-04-27 NOTE — Patient Instructions (Addendum)
1.  Diagnosis:  Thrombocytopenia (low platelet count) due to hepatitis C. 2.  Recommendation:  Again, I strongly recommend referral to a liver specialist. 3.  Follow up:  Discharge from clinic.  Follow up with primary care physician.  In the future, if despite treatment of hepatitis C and your platelet count decreases to less than 30 or if you develop also low white blood cell and anemia, then further work up here at the [facility] may be considered.

## 2012-07-22 ENCOUNTER — Encounter (INDEPENDENT_AMBULATORY_CARE_PROVIDER_SITE_OTHER): Payer: PRIVATE HEALTH INSURANCE | Admitting: General Surgery

## 2012-08-04 ENCOUNTER — Ambulatory Visit (INDEPENDENT_AMBULATORY_CARE_PROVIDER_SITE_OTHER): Payer: PRIVATE HEALTH INSURANCE | Admitting: General Surgery

## 2012-08-04 ENCOUNTER — Encounter (INDEPENDENT_AMBULATORY_CARE_PROVIDER_SITE_OTHER): Payer: Self-pay | Admitting: General Surgery

## 2012-08-04 VITALS — BP 118/72 | HR 72 | Temp 97.8°F | Resp 14 | Ht 70.5 in | Wt 184.6 lb

## 2012-08-04 DIAGNOSIS — K439 Ventral hernia without obstruction or gangrene: Secondary | ICD-10-CM

## 2012-08-04 DIAGNOSIS — Z09 Encounter for follow-up examination after completed treatment for conditions other than malignant neoplasm: Secondary | ICD-10-CM

## 2012-08-04 NOTE — Patient Instructions (Addendum)
Please call Dr. Madilyn Fireman for an evaluation

## 2012-08-04 NOTE — Progress Notes (Signed)
Chief complaint: Abdominal bloating, followup umbilical hernia repair  History: Patient returns for a followup after repair of his small umbilical hernia in December 2013. He did not return for his initial postop check. He has a history of cirrhosis and upper GI bleed. He reports he did pretty well from a hernia repair and did not feel he needed a check. He however recently have some somewhat vague abdominal symptoms with some mid abdominal bloating and pressure. This is not particularly related to meals. He also notices some increased constipation. In addition he has noted a new very small bulge high in his epigastrium. He does have known gallstones.  Exam: BP 118/72  Pulse 72  Temp(Src) 97.8 F (36.6 C)  Resp 14  Ht 5' 10.5" (1.791 m)  Wt 184 lb 9.6 oz (83.734 kg)  BMI 26.1 kg/m2 General: Alert in no distress Abdomen: Abdomen is somewhat protuberant and standing. His small umbilical incision is well healed and the repair feels solid. He does now have her have a very small soft reducible epigastric midline hernia. With the patient lying I think there may be somewhat of a fluid wave. He does have a significant diastases as well.  Assessment and plan: Patient with history of cirrhosis, status post umbilical hernia repair. His hernia repair feels solid and well healed. He does have a new very small epigastric hernia which is essentially asymptomatic. On exam I wonder if he could have some ascites which may be responsible for his abdominal bloating. He also has some functional GI issues. I suggested that he should get back to see Dr. Madilyn Fireman for GI evaluation in regards to his liver disease and possible ascites. I would not repair his epigastric hernia at this point as it is very small and asymptomatic. I see no problems specifically related to his umbilical hernia repair. He has gallstones but I doubt that these are symptomatic. I told them I would be happy to see him back at any time after this if  things change or Dr. Madilyn Fireman felt that this might be a surgical issue. We discussed that he has no activity limitations at this time.

## 2012-08-19 ENCOUNTER — Other Ambulatory Visit: Payer: Self-pay | Admitting: Gastroenterology

## 2012-08-19 DIAGNOSIS — I8500 Esophageal varices without bleeding: Secondary | ICD-10-CM

## 2012-08-26 ENCOUNTER — Ambulatory Visit
Admission: RE | Admit: 2012-08-26 | Discharge: 2012-08-26 | Disposition: A | Discharge status: Home / Self Care | Payer: PRIVATE HEALTH INSURANCE | Source: Ambulatory Visit | Attending: Gastroenterology | Admitting: Gastroenterology

## 2012-08-26 DIAGNOSIS — I8500 Esophageal varices without bleeding: Secondary | ICD-10-CM

## 2013-01-31 ENCOUNTER — Other Ambulatory Visit: Payer: Self-pay | Admitting: Family Medicine

## 2013-01-31 DIAGNOSIS — R1900 Intra-abdominal and pelvic swelling, mass and lump, unspecified site: Secondary | ICD-10-CM

## 2013-02-03 ENCOUNTER — Ambulatory Visit
Admission: RE | Admit: 2013-02-03 | Discharge: 2013-02-03 | Disposition: A | Discharge status: Home / Self Care | Payer: PRIVATE HEALTH INSURANCE | Source: Ambulatory Visit | Attending: Family Medicine | Admitting: Family Medicine

## 2013-02-03 DIAGNOSIS — R1900 Intra-abdominal and pelvic swelling, mass and lump, unspecified site: Secondary | ICD-10-CM

## 2013-02-03 MED ORDER — IOHEXOL 300 MG/ML  SOLN
100.0000 mL | Freq: Once | INTRAMUSCULAR | Status: AC | PRN
  Administered 2013-02-03: 100 mL via INTRAVENOUS

## 2013-07-07 ENCOUNTER — Encounter (INDEPENDENT_AMBULATORY_CARE_PROVIDER_SITE_OTHER): Payer: PRIVATE HEALTH INSURANCE | Admitting: General Surgery

## 2013-09-06 ENCOUNTER — Other Ambulatory Visit: Payer: Self-pay | Admitting: Gastroenterology

## 2013-09-06 DIAGNOSIS — I8500 Esophageal varices without bleeding: Secondary | ICD-10-CM

## 2013-09-15 ENCOUNTER — Ambulatory Visit
Admission: RE | Admit: 2013-09-15 | Discharge: 2013-09-15 | Disposition: A | Discharge status: Home / Self Care | Payer: PRIVATE HEALTH INSURANCE | Source: Ambulatory Visit | Attending: Gastroenterology | Admitting: Gastroenterology

## 2013-09-15 DIAGNOSIS — I8500 Esophageal varices without bleeding: Secondary | ICD-10-CM

## 2013-09-15 MED ORDER — GADOXETATE DISODIUM 0.25 MMOL/ML IV SOLN: 7.0000 mL | Freq: Once | INTRAVENOUS | Status: AC | PRN

## 2013-10-05 ENCOUNTER — Encounter (INDEPENDENT_AMBULATORY_CARE_PROVIDER_SITE_OTHER): Payer: PRIVATE HEALTH INSURANCE | Admitting: General Surgery

## 2013-10-06 ENCOUNTER — Encounter (INDEPENDENT_AMBULATORY_CARE_PROVIDER_SITE_OTHER): Payer: Self-pay | Admitting: General Surgery

## 2013-10-06 ENCOUNTER — Ambulatory Visit (INDEPENDENT_AMBULATORY_CARE_PROVIDER_SITE_OTHER): Payer: PRIVATE HEALTH INSURANCE | Admitting: General Surgery

## 2013-10-06 VITALS — BP 110/70 | HR 72 | Temp 98.3°F | Ht 70.0 in | Wt 189.0 lb

## 2013-10-06 DIAGNOSIS — K4090 Unilateral inguinal hernia, without obstruction or gangrene, not specified as recurrent: Secondary | ICD-10-CM

## 2013-10-06 NOTE — Patient Instructions (Signed)
Call as needed for any worsening problems and we will otherwise see you back in 3 months.

## 2013-10-06 NOTE — Progress Notes (Signed)
Chief complaint: Inguinal hernia  History: Patient returns to the office. He is well known to me from previous visits. I saw him in May of this year with concern over a recurrent umbilical hernia. I did not find evidence of an umbilical hernia.. I felt he had increasing ascites at that time and referred him back to Dr. Amedeo Plenty for evaluation. The patient has known cirrhosis with hepatitis C and history of alcohol abuse. 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 The patient reports that he has been referred to be on the transplant list at [facility]. He comes in the office today because of what he feels is an inguinal hernia and increased swelling in his scrotum. The patient has a remote history of left inguinal hernia repair. He says his hernia feels to be on the right but may be on the left as well. It is getting painful for him due to pressure. The patient is having difficulty working because of this and other reasons such as weakness but does not want to quit because he needs insurance for possible transplant and is not get Medicare until November. He denies nausea or vomiting.  Past Medical History  Diagnosis Date  . Macrocytosis   . Thrombocytopenia   . Hepatitis C 1980's  . Esophageal bleed, non-variceal 2006  . HTN (hypertension)   . Psoriasis   . Mitral valve prolapse     OCCAS FLUTTER FEELING  . Vitamin D deficiency   . Diabetes mellitus     DIET CONTROL  . Arthritis   . Umbilical hernia     OCCAS DISCOMFORT  . Prostate enlargement     PT TOLD "NORMAL FOR AGE" - TAKES FINASTERIDE AND FLOMAX   Past Surgical History  Procedure Laterality Date  . Tonsilectomy, adenoidectomy, bilateral myringotomy and tubes  child  . Inguinal hernia repair  1970's    left  . Umbilical hernia repair  03/09/2012    Procedure: HERNIA REPAIR UMBILICAL ADULT;  Surgeon: Edward Jolly, MD;  Location: [facility];  Service: General;  Laterality: N/A;  Repair Umbilical Hernia with  Mesh  . Insertion of mesh  03/09/2012    Procedure: INSERTION OF MESH;  Surgeon: Edward Jolly, MD;  Location: [facility];  Service: General;  Laterality: N/A;   Current Outpatient Prescriptions  Medication Sig Dispense Refill  . Carboxymethylcellul-Glycerin (CLEAR EYES FOR DRY EYES) 1-0.25 % SOLN Apply 1-2 drops to eye daily as needed. For dry eyes      . Clobetasol Propionate 0.05 % lotion Apply 1 application topically 2 (two) times daily. Apply to affected areas      . finasteride (PROSCAR) 5 MG tablet Take 5 mg by mouth daily.      Marland Kitchen ibuprofen (ADVIL,MOTRIN) 600 MG tablet Take 600 mg by mouth every 6 (six) hours as needed. For pain      . Tamsulosin HCl (FLOMAX) 0.4 MG CAPS Take 0.4 mg by mouth daily.      . Vitamin D, Ergocalciferol, (DRISDOL) 50000 UNITS CAPS Take 50,000 Units by mouth every Friday.      . zolpidem (AMBIEN) 10 MG tablet Take 10 mg by mouth at bedtime as needed. For sleep      . nadolol (CORGARD) 20 MG tablet Take 20 mg by mouth at bedtime.       No current facility-administered medications for this visit.   No Known Allergies  Exam: BP 110/70  Pulse 72  Temp(Src) 98.3 F (  36.8 C)  Ht 5\' 10"  (1.778 m)  Wt 189 lb (85.73 kg)  BMI 27.12 kg/m2 General: Very thin Caucasian male in no distress Skin: Patchy plaque like rash over her trunk and extremities consistent with psoriasis Lungs: Clear good breath sounds bilaterally Cardiac: Regular rate and rhythm. Abdomen: Moderate to marked distention with fluid wave. Well-healed umbilical incision. No palpable masses. There is significant scrotal and penile edema as well as fluid in what feels to be a definite right inguinal hernia which is reducible expressing fluid but not tissue back toward the abdomen. There is also a fairly small tender mass at the left inguinal canal possibly consistent with left inguinal hernia and probably some fluid in the left hemiscrotum as well. Exam is difficult due to generalized tissue  edema. Extremities: 2-3+ lower extremity edema  CT scan November 2014 showed an apparent small fat-containing left inguinal hernia and some fluid in the right inguinal canal with increasing ascites and evidence of cirrhosis and portal hypertension  Assessment and plan:  Probable bilateral inguinal hernias. This is in the face of severe cirrhosis with portal hypertension and ascites and now recent diagnosis of hepatoma. Patient reports that he is trying to get a liver transplant. His hernias I think are actually small in terms of muscle defect with large fluid-filled sac is secondary to his ascites. I would not recommend repair at this time as he would be at high risk for complications due to 2 high volume of ascites and tissue edema. His hernias do not apparently contained bowel. We would not want interfere with his chance of getting a liver transplant. Ideally if he could have a transplant and manage his ascites his hernias may not even be symptomatic or could be repaired much more safely and easily. The patient understands and agrees with this plan. I told him he could call me anytime if he felt they were getting definitely worse and I will plan to see him back in about 3 months to recheck.

## 2013-10-25 ENCOUNTER — Encounter: Payer: Self-pay | Admitting: Interventional Cardiology

## 2013-10-25 DIAGNOSIS — I341 Nonrheumatic mitral (valve) prolapse: Secondary | ICD-10-CM | POA: Insufficient documentation

## 2013-10-25 DIAGNOSIS — B182 Chronic viral hepatitis C: Secondary | ICD-10-CM | POA: Insufficient documentation

## 2013-10-25 DIAGNOSIS — D696 Thrombocytopenia, unspecified: Secondary | ICD-10-CM | POA: Insufficient documentation

## 2013-10-25 DIAGNOSIS — K228 Other specified diseases of esophagus: Secondary | ICD-10-CM | POA: Insufficient documentation

## 2013-10-25 DIAGNOSIS — L409 Psoriasis, unspecified: Secondary | ICD-10-CM | POA: Insufficient documentation

## 2013-10-25 DIAGNOSIS — M1990 Unspecified osteoarthritis, unspecified site: Secondary | ICD-10-CM | POA: Insufficient documentation

## 2013-10-25 DIAGNOSIS — N400 Enlarged prostate without lower urinary tract symptoms: Secondary | ICD-10-CM | POA: Insufficient documentation

## 2013-10-25 DIAGNOSIS — K429 Umbilical hernia without obstruction or gangrene: Secondary | ICD-10-CM | POA: Insufficient documentation

## 2013-10-25 DIAGNOSIS — D7589 Other specified diseases of blood and blood-forming organs: Secondary | ICD-10-CM | POA: Insufficient documentation

## 2013-10-25 DIAGNOSIS — K7460 Unspecified cirrhosis of liver: Secondary | ICD-10-CM

## 2013-10-25 DIAGNOSIS — K2289 Other specified disease of esophagus: Secondary | ICD-10-CM | POA: Insufficient documentation

## 2013-10-25 DIAGNOSIS — I10 Essential (primary) hypertension: Secondary | ICD-10-CM | POA: Insufficient documentation

## 2013-10-31 DIAGNOSIS — B1920 Unspecified viral hepatitis C without hepatic coma: Secondary | ICD-10-CM | POA: Insufficient documentation

## 2013-11-02 ENCOUNTER — Other Ambulatory Visit (HOSPITAL_COMMUNITY): Payer: Self-pay | Admitting: Gastroenterology

## 2013-11-02 DIAGNOSIS — R140 Abdominal distension (gaseous): Secondary | ICD-10-CM

## 2013-11-03 ENCOUNTER — Encounter (HOSPITAL_COMMUNITY): Payer: Self-pay

## 2013-11-03 ENCOUNTER — Ambulatory Visit (HOSPITAL_COMMUNITY)
Admission: RE | Admit: 2013-11-03 | Discharge: 2013-11-03 | Disposition: A | Discharge status: Home / Self Care | Payer: PRIVATE HEALTH INSURANCE | Source: Ambulatory Visit | Attending: Gastroenterology | Admitting: Gastroenterology

## 2013-11-03 DIAGNOSIS — B1920 Unspecified viral hepatitis C without hepatic coma: Secondary | ICD-10-CM | POA: Insufficient documentation

## 2013-11-03 DIAGNOSIS — R188 Other ascites: Secondary | ICD-10-CM | POA: Insufficient documentation

## 2013-11-03 DIAGNOSIS — K7689 Other specified diseases of liver: Secondary | ICD-10-CM | POA: Insufficient documentation

## 2013-11-03 DIAGNOSIS — R140 Abdominal distension (gaseous): Secondary | ICD-10-CM

## 2013-11-03 DIAGNOSIS — K7460 Unspecified cirrhosis of liver: Secondary | ICD-10-CM | POA: Insufficient documentation

## 2013-11-03 MED ORDER — ALBUMIN HUMAN 25 % IV SOLN
50.0000 g | Freq: Once | INTRAVENOUS | Status: AC
  Administered 2013-11-03: 50 g via INTRAVENOUS
  Filled 2013-11-03: qty 200

## 2013-11-03 MED ORDER — SODIUM CHLORIDE 0.9 % IV SOLN
INTRAVENOUS | Status: DC
Start: 2013-11-03 — End: 2013-11-04
  Administered 2013-11-03: 09:00:00 via INTRAVENOUS

## 2013-11-03 NOTE — Discharge Instructions (Signed)
° ° ° ° ° ° ° ° ° ° ° ° ° ° ° ° ° ° ° ° ° ° ° ° ° ° ° ° ° ° ° ° ° ° ° ° ° ° ° ° ° ° ° ° ° ° ° ° ° ° ° ° ° ° ° ° ° ° ° ° ° ° ° ° ° ° ° ° ° ° ° ° ° ° ° ° ° ° ° ° ° ° ° ° ° ° ° ° ° ° ° ° ° ° ° ° ° ° ° ° ° ° ° ° °  Albumin °Albumin tests are done as a screen for a liver disorder or kidney disease or to check nutritional status, especially in hospitalized patients (prealbumin is sometimes used instead of albumin in this situation). °Albumin is the most plentiful protein in the blood plasma. It keeps fluid from leaking out of blood vessels; nourishes tissues; and transports hormones, vitamins, drugs, and ions like calcium throughout the body. Albumin is made in the liver and is extremely sensitive to liver damage. The concentration of albumin drops when the liver is damaged, with kidney disease (nephrotic syndrome), when a person is malnourished, if a person experiences inflammation in the body, or with shock. Albumin increases when a person is dehydrated. °PREPARATION FOR TEST °No preparation or fasting is necessary. A blood sample is taken by a needle from a vein. Tell the person doing the test if you are pregnant. °NORMAL FINDINGS  °Adult/Elderly °· Total Protein: 6.4-8.3 g/dL or 64-83g/L (SI units) °· Albumin; 3.5-5 g/dL or 35-50 g/L (SI units) °· Globulin 2.3-3.4 g/dL °¨ Alpha1 globulin: 0.1-3 g/dL or 1-3 g/L (SI units) °¨ Alpha2 globulin: 0.6-1 g/dL or6-10 g/L (SI units) °¨ Beta globulin: 0.7-1.1 g/dL or 7-11 g/L (SI units) °Children °· Total protein. °¨ Premature infant: 4.2-7.6 g/L °¨ Newborn: 4.6-7.4 g/dL °¨ Infant: 6-6.7 g/L °· Albumin. °¨ Premature infant: 3-4.2 g/dL °¨ Newborn: 3.5-5.4 g/dL °¨ Infant: 4.4-5.4 g/dL °¨ Child: 4-5.9 g/dL °Ranges for normal findings may vary among different laboratories and hospitals. You should always check with your doctor after having lab work or other tests done to discuss the meaning of your test results and whether your values are considered within normal limits. °MEANING OF  TEST  °Your caregiver will go over the test results with you and discuss the importance and meaning of your results, as well as treatment options and the need for additional tests if necessary. °OBTAINING THE TEST RESULTS °It is your responsibility to obtain your test results. Ask the lab or department performing the test when and how you will get your results. °Document Released: 04/07/2004 Document Revised: 06/08/2011 Document Reviewed: 02/19/2008 °[employer]® Patient Information ©2015 [employer]. This information is not intended to replace advice given to you by your health care provider. Make sure you discuss any questions you have with your health care provider. ° °

## 2013-11-03 NOTE — Procedures (Signed)
US guided therapeutic paracentesis performed yielding 5 liters (maximum ordered) yellow fluid. No immediate complications. The pt received IV albumin preprocedure.

## 2013-11-10 ENCOUNTER — Emergency Department (HOSPITAL_COMMUNITY): Payer: PRIVATE HEALTH INSURANCE

## 2013-11-10 ENCOUNTER — Inpatient Hospital Stay (HOSPITAL_COMMUNITY)
Admission: EM | Admit: 2013-11-10 | Discharge: 2013-11-13 | DRG: 394 | Disposition: A | Discharge status: Home / Self Care | Payer: PRIVATE HEALTH INSURANCE | Attending: Internal Medicine | Admitting: Internal Medicine

## 2013-11-10 ENCOUNTER — Encounter (HOSPITAL_COMMUNITY): Payer: Self-pay | Admitting: Emergency Medicine

## 2013-11-10 DIAGNOSIS — Z800 Family history of malignant neoplasm of digestive organs: Secondary | ICD-10-CM | POA: Diagnosis not present

## 2013-11-10 DIAGNOSIS — I81 Portal vein thrombosis: Secondary | ICD-10-CM

## 2013-11-10 DIAGNOSIS — Z791 Long term (current) use of non-steroidal anti-inflammatories (NSAID): Secondary | ICD-10-CM

## 2013-11-10 DIAGNOSIS — R1013 Epigastric pain: Secondary | ICD-10-CM | POA: Diagnosis present

## 2013-11-10 DIAGNOSIS — M129 Arthropathy, unspecified: Secondary | ICD-10-CM | POA: Diagnosis present

## 2013-11-10 DIAGNOSIS — Z8042 Family history of malignant neoplasm of prostate: Secondary | ICD-10-CM | POA: Diagnosis not present

## 2013-11-10 DIAGNOSIS — L408 Other psoriasis: Secondary | ICD-10-CM | POA: Diagnosis present

## 2013-11-10 DIAGNOSIS — B1920 Unspecified viral hepatitis C without hepatic coma: Secondary | ICD-10-CM | POA: Diagnosis present

## 2013-11-10 DIAGNOSIS — R188 Other ascites: Secondary | ICD-10-CM | POA: Diagnosis present

## 2013-11-10 DIAGNOSIS — E440 Moderate protein-calorie malnutrition: Secondary | ICD-10-CM

## 2013-11-10 DIAGNOSIS — R109 Unspecified abdominal pain: Secondary | ICD-10-CM | POA: Diagnosis present

## 2013-11-10 DIAGNOSIS — L409 Psoriasis, unspecified: Secondary | ICD-10-CM

## 2013-11-10 DIAGNOSIS — C228 Malignant neoplasm of liver, primary, unspecified as to type: Secondary | ICD-10-CM | POA: Diagnosis present

## 2013-11-10 DIAGNOSIS — M1990 Unspecified osteoarthritis, unspecified site: Secondary | ICD-10-CM

## 2013-11-10 DIAGNOSIS — K7460 Unspecified cirrhosis of liver: Secondary | ICD-10-CM

## 2013-11-10 DIAGNOSIS — I059 Rheumatic mitral valve disease, unspecified: Secondary | ICD-10-CM | POA: Diagnosis present

## 2013-11-10 DIAGNOSIS — D6959 Other secondary thrombocytopenia: Secondary | ICD-10-CM | POA: Diagnosis present

## 2013-11-10 DIAGNOSIS — I341 Nonrheumatic mitral (valve) prolapse: Secondary | ICD-10-CM

## 2013-11-10 DIAGNOSIS — Z87891 Personal history of nicotine dependence: Secondary | ICD-10-CM

## 2013-11-10 DIAGNOSIS — D7589 Other specified diseases of blood and blood-forming organs: Secondary | ICD-10-CM

## 2013-11-10 DIAGNOSIS — K660 Peritoneal adhesions (postprocedural) (postinfection): Secondary | ICD-10-CM | POA: Diagnosis present

## 2013-11-10 DIAGNOSIS — I10 Essential (primary) hypertension: Secondary | ICD-10-CM | POA: Diagnosis present

## 2013-11-10 DIAGNOSIS — K2289 Other specified disease of esophagus: Secondary | ICD-10-CM

## 2013-11-10 DIAGNOSIS — D696 Thrombocytopenia, unspecified: Secondary | ICD-10-CM

## 2013-11-10 DIAGNOSIS — D72819 Decreased white blood cell count, unspecified: Secondary | ICD-10-CM

## 2013-11-10 DIAGNOSIS — I8290 Acute embolism and thrombosis of unspecified vein: Secondary | ICD-10-CM

## 2013-11-10 DIAGNOSIS — E871 Hypo-osmolality and hyponatremia: Secondary | ICD-10-CM | POA: Diagnosis present

## 2013-11-10 DIAGNOSIS — Z794 Long term (current) use of insulin: Secondary | ICD-10-CM

## 2013-11-10 DIAGNOSIS — K7030 Alcoholic cirrhosis of liver without ascites: Secondary | ICD-10-CM | POA: Diagnosis present

## 2013-11-10 DIAGNOSIS — E119 Type 2 diabetes mellitus without complications: Secondary | ICD-10-CM | POA: Diagnosis present

## 2013-11-10 DIAGNOSIS — B182 Chronic viral hepatitis C: Secondary | ICD-10-CM | POA: Diagnosis present

## 2013-11-10 DIAGNOSIS — N400 Enlarged prostate without lower urinary tract symptoms: Secondary | ICD-10-CM

## 2013-11-10 DIAGNOSIS — K228 Other specified diseases of esophagus: Secondary | ICD-10-CM

## 2013-11-10 HISTORY — DX: Malignant neoplasm of liver, not specified as primary or secondary: C22.9

## 2013-11-10 HISTORY — DX: Type 2 diabetes mellitus without complications: E11.9

## 2013-11-10 HISTORY — DX: Cardiac murmur, unspecified: R01.1

## 2013-11-10 HISTORY — DX: Rheumatic fever without heart involvement: I00

## 2013-11-10 LAB — URINALYSIS, ROUTINE W REFLEX MICROSCOPIC
GLUCOSE, UA: NEGATIVE mg/dL
Hgb urine dipstick: NEGATIVE
KETONES UR: NEGATIVE mg/dL
Leukocytes, UA: NEGATIVE
Nitrite: NEGATIVE
Protein, ur: NEGATIVE mg/dL
Specific Gravity, Urine: 1.01 (ref 1.005–1.030)
Urobilinogen, UA: 1 mg/dL (ref 0.0–1.0)
pH: 7 (ref 5.0–8.0)

## 2013-11-10 LAB — PROTIME-INR
INR: 1.36 (ref 0.00–1.49)
PROTHROMBIN TIME: 16.8 s — AB (ref 11.6–15.2)

## 2013-11-10 LAB — CBC WITH DIFFERENTIAL/PLATELET
BASOS ABS: 0 10*3/uL (ref 0.0–0.1)
Basophils Relative: 1 % (ref 0–1)
EOS PCT: 4 % (ref 0–5)
Eosinophils Absolute: 0.2 10*3/uL (ref 0.0–0.7)
HEMATOCRIT: 38 % — AB (ref 39.0–52.0)
Hemoglobin: 13.7 g/dL (ref 13.0–17.0)
LYMPHS PCT: 22 % (ref 12–46)
Lymphs Abs: 0.8 10*3/uL (ref 0.7–4.0)
MCH: 35.8 pg — AB (ref 26.0–34.0)
MCHC: 36.1 g/dL — ABNORMAL HIGH (ref 30.0–36.0)
MCV: 99.2 fL (ref 78.0–100.0)
MONO ABS: 0.5 10*3/uL (ref 0.1–1.0)
Monocytes Relative: 15 % — ABNORMAL HIGH (ref 3–12)
Neutro Abs: 2.1 10*3/uL (ref 1.7–7.7)
Neutrophils Relative %: 58 % (ref 43–77)
Platelets: 78 10*3/uL — ABNORMAL LOW (ref 150–400)
RBC: 3.83 MIL/uL — ABNORMAL LOW (ref 4.22–5.81)
RDW: 12.3 % (ref 11.5–15.5)
WBC: 3.6 10*3/uL — ABNORMAL LOW (ref 4.0–10.5)

## 2013-11-10 LAB — COMPREHENSIVE METABOLIC PANEL
ALT: 14 U/L (ref 0–53)
AST: 24 U/L (ref 0–37)
Albumin: 2.9 g/dL — ABNORMAL LOW (ref 3.5–5.2)
Alkaline Phosphatase: 84 U/L (ref 39–117)
Anion gap: 10 (ref 5–15)
BUN: 11 mg/dL (ref 6–23)
CALCIUM: 9 mg/dL (ref 8.4–10.5)
CO2: 29 meq/L (ref 19–32)
CREATININE: 0.64 mg/dL (ref 0.50–1.35)
Chloride: 97 mEq/L (ref 96–112)
GFR calc Af Amer: >90 mL/min (ref >90)
Glucose, Bld: 208 mg/dL — ABNORMAL HIGH (ref 70–99)
Potassium: 4.2 mEq/L (ref 3.7–5.3)
Sodium: 136 mEq/L — ABNORMAL LOW (ref 137–147)
Total Bilirubin: 2.1 mg/dL — ABNORMAL HIGH (ref 0.3–1.2)
Total Protein: 6.5 g/dL (ref 6.0–8.3)

## 2013-11-10 LAB — GLUCOSE, CAPILLARY
Glucose-Capillary: 124 mg/dL — ABNORMAL HIGH (ref 70–99)
Glucose-Capillary: 185 mg/dL — ABNORMAL HIGH (ref 70–99)

## 2013-11-10 LAB — LIPASE, BLOOD: LIPASE: 29 U/L (ref 11–59)

## 2013-11-10 LAB — OCCULT BLOOD X 1 CARD TO LAB, STOOL: Fecal Occult Bld: NEGATIVE

## 2013-11-10 LAB — APTT: aPTT: 36 seconds (ref 24–37)

## 2013-11-10 MED ORDER — HYDROCHLOROTHIAZIDE 50 MG PO TABS
50.0000 mg | ORAL_TABLET | Freq: Every day | ORAL | Status: DC
  Administered 2013-11-10 – 2013-11-13 (×4): 50 mg via ORAL
  Filled 2013-11-10 (×4): qty 1

## 2013-11-10 MED ORDER — CARBOXYMETHYLCELLUL-GLYCERIN 1-0.25 % OP SOLN: 1.0000 [drp] | Freq: Every day | OPHTHALMIC | Status: DC | PRN

## 2013-11-10 MED ORDER — INSULIN ASPART 100 UNIT/ML ~~LOC~~ SOLN
0.0000 [IU] | Freq: Three times a day (TID) | SUBCUTANEOUS | Status: DC
  Administered 2013-11-10: 1 [IU] via SUBCUTANEOUS
  Administered 2013-11-11: 5 [IU] via SUBCUTANEOUS
  Administered 2013-11-12: 2 [IU] via SUBCUTANEOUS
  Administered 2013-11-12: 9 [IU] via SUBCUTANEOUS
  Administered 2013-11-13: 3 [IU] via SUBCUTANEOUS
  Administered 2013-11-13: 2 [IU] via SUBCUTANEOUS

## 2013-11-10 MED ORDER — SPIRONOLACTONE-HCTZ 50-50 MG PO TABS: 1.0000 | ORAL_TABLET | Freq: Every day | ORAL | Status: DC

## 2013-11-10 MED ORDER — ENOXAPARIN SODIUM 40 MG/0.4ML ~~LOC~~ SOLN
40.0000 mg | SUBCUTANEOUS | Status: DC
  Administered 2013-11-10: 40 mg via SUBCUTANEOUS
  Filled 2013-11-10 (×2): qty 0.4

## 2013-11-10 MED ORDER — DOCUSATE SODIUM 100 MG PO CAPS
100.0000 mg | ORAL_CAPSULE | Freq: Two times a day (BID) | ORAL | Status: DC
  Administered 2013-11-10 – 2013-11-13 (×6): 100 mg via ORAL
  Filled 2013-11-10 (×7): qty 1

## 2013-11-10 MED ORDER — ZOLPIDEM TARTRATE 5 MG PO TABS
10.0000 mg | ORAL_TABLET | Freq: Every evening | ORAL | Status: DC | PRN
  Administered 2013-11-10 – 2013-11-12 (×3): 10 mg via ORAL
  Filled 2013-11-10 (×3): qty 2

## 2013-11-10 MED ORDER — OXYCODONE HCL 5 MG PO TABS
5.0000 mg | ORAL_TABLET | ORAL | Status: DC | PRN
  Administered 2013-11-11 – 2013-11-13 (×3): 5 mg via ORAL
  Filled 2013-11-10 (×3): qty 1

## 2013-11-10 MED ORDER — TAMSULOSIN HCL 0.4 MG PO CAPS
0.4000 mg | ORAL_CAPSULE | Freq: Every day | ORAL | Status: DC
  Administered 2013-11-10 – 2013-11-13 (×4): 0.4 mg via ORAL
  Filled 2013-11-10 (×4): qty 1

## 2013-11-10 MED ORDER — SPIRONOLACTONE 50 MG PO TABS
50.0000 mg | ORAL_TABLET | Freq: Every day | ORAL | Status: DC
  Administered 2013-11-10 – 2013-11-13 (×4): 50 mg via ORAL
  Filled 2013-11-10 (×4): qty 1

## 2013-11-10 MED ORDER — VITAMIN D (ERGOCALCIFEROL) 1.25 MG (50000 UNIT) PO CAPS
50000.0000 [IU] | ORAL_CAPSULE | ORAL | Status: DC
  Administered 2013-11-10: 50000 [IU] via ORAL
  Filled 2013-11-10: qty 1

## 2013-11-10 MED ORDER — ONDANSETRON HCL 4 MG/2ML IJ SOLN
4.0000 mg | Freq: Once | INTRAMUSCULAR | Status: AC
  Administered 2013-11-10: 4 mg via INTRAVENOUS
  Filled 2013-11-10: qty 2

## 2013-11-10 MED ORDER — MORPHINE SULFATE 4 MG/ML IJ SOLN
4.0000 mg | Freq: Once | INTRAMUSCULAR | Status: AC
  Administered 2013-11-10: 4 mg via INTRAVENOUS
  Filled 2013-11-10: qty 1

## 2013-11-10 MED ORDER — POLYETHYLENE GLYCOL 3350 17 G PO PACK
17.0000 g | PACK | Freq: Every day | ORAL | Status: DC | PRN
  Filled 2013-11-10: qty 1

## 2013-11-10 MED ORDER — INSULIN GLARGINE 100 UNIT/ML ~~LOC~~ SOLN
14.0000 [IU] | Freq: Every day | SUBCUTANEOUS | Status: DC
  Administered 2013-11-10 – 2013-11-13 (×4): 14 [IU] via SUBCUTANEOUS
  Filled 2013-11-10 (×5): qty 0.14

## 2013-11-10 MED ORDER — FINASTERIDE 5 MG PO TABS
5.0000 mg | ORAL_TABLET | Freq: Every day | ORAL | Status: DC
  Administered 2013-11-10 – 2013-11-13 (×4): 5 mg via ORAL
  Filled 2013-11-10 (×4): qty 1

## 2013-11-10 MED ORDER — IOHEXOL 300 MG/ML  SOLN
25.0000 mL | Freq: Once | INTRAMUSCULAR | Status: AC | PRN
  Administered 2013-11-10: 25 mL via ORAL

## 2013-11-10 MED ORDER — IOHEXOL 300 MG/ML  SOLN
80.0000 mL | Freq: Once | INTRAMUSCULAR | Status: AC | PRN
  Administered 2013-11-10: 80 mL via INTRAVENOUS

## 2013-11-10 MED ORDER — ONDANSETRON HCL 4 MG PO TABS
4.0000 mg | ORAL_TABLET | Freq: Four times a day (QID) | ORAL | Status: DC | PRN
  Administered 2013-11-11: 4 mg via ORAL
  Filled 2013-11-10: qty 1

## 2013-11-10 MED ORDER — POLYVINYL ALCOHOL 1.4 % OP SOLN
1.0000 [drp] | Freq: Every day | OPHTHALMIC | Status: DC | PRN
  Filled 2013-11-10: qty 15

## 2013-11-10 MED ORDER — FUROSEMIDE 20 MG PO TABS
20.0000 mg | ORAL_TABLET | Freq: Every day | ORAL | Status: DC
  Administered 2013-11-11 – 2013-11-13 (×3): 20 mg via ORAL
  Filled 2013-11-10 (×3): qty 1

## 2013-11-10 MED ORDER — ONDANSETRON HCL 4 MG/2ML IJ SOLN: 4.0000 mg | Freq: Four times a day (QID) | INTRAMUSCULAR | Status: DC | PRN

## 2013-11-10 MED ORDER — NADOLOL 20 MG PO TABS
20.0000 mg | ORAL_TABLET | Freq: Every day | ORAL | Status: DC
  Administered 2013-11-11: 20 mg via ORAL
  Filled 2013-11-10 (×4): qty 1

## 2013-11-10 NOTE — Consult Note (Signed)
Referring Provider: Dr. Janece Canterbury   Primary Care Physician:  Gerrit Heck, MD Primary Gastroenterologist:  Dr. Teena Irani  Reason for Consultation:  ?portal vein thrombosis  HPI: Ethan Wade is a 63 y.o. male admitted through the emergency room today because of a long-standing but progressive history of recurring abdominal pain. The patient thinks it's been going on for perhaps a year, but has been more pronounced since undergoing a therapeutic paracentesis about a week ago.   He has cirrhosis on the basis of apparently both hepatitis C and alcohol, and has hepatocellular carcinoma for which interventional radiology at [facility] plans insertion of radioactive beads about 10 days from now.  The pain occurs on a daily basis, typically made worse by standing up, and causing him to bend over to try to relieve it. It is mainly relieved by lying down. It might be worse after meals, but not consistently so. It generally does not bother him at night or awaken him out of sleep. When it occurs, it lasts for perhaps 30 minutes or so and gradually eases off after lying down. Therefore, the pain is episodic rather than continuous in character. When it occurs, the patient is basically incapacitated and has to interrupt normal activities.  The pain is located in the periumbilical area. Note that he does have bilateral inguinal hernias, and the discomfort associated with that improved after the patient had his large-volume paracentesis a week ago.  In addition to dealing with this progressively decompensated cirrhosis and his hepatocellular carcinoma, the patient is under stress, still grieving the loss of this 48 year old son 4 months ago.  The patient has a remote history of esophageal variceal bleeding many years ago, for which he underwent banding. His most recent endoscopy, 4 years ago, showed grade 1 varices.  The patient had an abdominal CT scan in the emergency room  today, which showed evidence of a possible portal vein thrombosis, and as well as immense varices in the region of the distal esophagus and the cardia of the stomach. I reviewed the CT at some length with multiple radiologists, and the consensus was that the quality of the study is rather low and it is difficult to say for sure whether a portal vein thrombosis but is present, and if so, whether it is acute or chronic. A similar, ambiguous finding was noted on CT scanning about a year ago. Therefore, it is not clear whether something is going on in the portal vein at this time, and if so, whether it was present previously.   Past Medical History  Diagnosis Date  . Macrocytosis   . Thrombocytopenia   . Esophageal bleed, non-variceal 2006  . HTN (hypertension)   . Psoriasis   . Mitral valve prolapse     OCCAS FLUTTER FEELING  . Vitamin D deficiency   . Umbilical hernia     OCCAS DISCOMFORT  . Prostate enlargement     PT TOLD "NORMAL FOR AGE" - TAKES FINASTERIDE AND FLOMAX  . Heart murmur   . Type II diabetes mellitus   . Rheumatic fever 1950's  . Hepatitis C 1980's  . Arthritis     "joint stiffness in the knees" (11/10/2013)  . Liver cancer     Past Surgical History  Procedure Laterality Date  . Tonsilectomy, adenoidectomy, bilateral myringotomy and tubes  child  . Umbilical hernia repair  03/09/2012    Procedure: HERNIA REPAIR UMBILICAL ADULT;  Surgeon: Edward Jolly, MD;  Location: [facility];  Service: General;  Laterality: N/A;  Repair Umbilical Hernia with Mesh  . Insertion of mesh  03/09/2012    Procedure: INSERTION OF MESH;  Surgeon: Edward Jolly, MD;  Location: [facility];  Service: General;  Laterality: N/A;  . Inguinal hernia repair Left 1970's  . Hernia repair    . Tonsillectomy      Prior to Admission medications   Medication Sig Start Date End Date Taking? Authorizing Provider  Carboxymethylcellul-Glycerin (CLEAR EYES FOR DRY EYES) 1-0.25 % SOLN Apply 1-2 drops  to eye daily as needed. For dry eyes   Yes Historical Provider, MD  finasteride (PROSCAR) 5 MG tablet Take 5 mg by mouth daily.   Yes Historical Provider, MD  furosemide (LASIX) 20 MG tablet Take 20 mg by mouth.   Yes Historical Provider, MD  ibuprofen (ADVIL,MOTRIN) 600 MG tablet Take 600 mg by mouth every 6 (six) hours as needed. For pain   Yes Historical Provider, MD  insulin glargine (LANTUS) 100 UNIT/ML injection Inject 14 Units into the skin daily.   Yes Historical Provider, MD  nadolol (CORGARD) 20 MG tablet Take 20 mg by mouth daily.   Yes Historical Provider, MD  spironolactone-hydrochlorothiazide (ALDACTAZIDE) 50-50 MG per tablet Take 1 tablet by mouth daily.   Yes Historical Provider, MD  Tamsulosin HCl (FLOMAX) 0.4 MG CAPS Take 0.4 mg by mouth daily.   Yes Historical Provider, MD  Vitamin D, Ergocalciferol, (DRISDOL) 50000 UNITS CAPS Take 50,000 Units by mouth every Friday.   Yes Historical Provider, MD  zolpidem (AMBIEN) 10 MG tablet Take 10 mg by mouth at bedtime as needed. For sleep   Yes Historical Provider, MD  nadolol (CORGARD) 20 MG tablet Take 20 mg by mouth at bedtime. 03/26/11 03/25/12  Ileene Patrick, MD    Current Facility-Administered Medications  Medication Dose Route Frequency Provider Last Rate Last Dose  . docusate sodium (COLACE) capsule 100 mg  100 mg Oral BID Janece Canterbury, MD      . enoxaparin (LOVENOX) injection 40 mg  40 mg Subcutaneous Q24H Gaynell Face Hiatt, RPH      . finasteride (PROSCAR) tablet 5 mg  5 mg Oral Daily Janece Canterbury, MD   5 mg at 11/10/13 1821  . [START ON 11/11/2013] furosemide (LASIX) tablet 20 mg  20 mg Oral Daily Janece Canterbury, MD      . hydrochlorothiazide (HYDRODIURIL) tablet 50 mg  50 mg Oral Daily Janece Canterbury, MD   50 mg at 11/10/13 1821  . insulin aspart (novoLOG) injection 0-9 Units  0-9 Units Subcutaneous TID WC Janece Canterbury, MD   1 Units at 11/10/13 2119  . insulin glargine (LANTUS) injection 14 Units  14 Units  Subcutaneous Daily Janece Canterbury, MD   14 Units at 11/10/13 1821  . nadolol (CORGARD) tablet 20 mg  20 mg Oral QHS Janece Canterbury, MD      . ondansetron [facility]) tablet 4 mg  4 mg Oral Q6H PRN Janece Canterbury, MD       Or  . ondansetron (ZOFRAN) injection 4 mg  4 mg Intravenous Q6H PRN Janece Canterbury, MD      . oxyCODONE (Oxy IR/ROXICODONE) immediate release tablet 5 mg  5 mg Oral Q4H PRN Janece Canterbury, MD      . polyethylene glycol (MIRALAX / GLYCOLAX) packet 17 g  17 g Oral Daily PRN Janece Canterbury, MD      . polyvinyl alcohol (LIQUIFILM TEARS) 1.4 % ophthalmic solution 1-2 drop  1-2 drop Both Eyes  Daily PRN Janece Canterbury, MD      . spironolactone (ALDACTONE) tablet 50 mg  50 mg Oral Daily Janece Canterbury, MD   50 mg at 11/10/13 1820  . tamsulosin (FLOMAX) capsule 0.4 mg  0.4 mg Oral Daily Janece Canterbury, MD   0.4 mg at 11/10/13 1821  . Vitamin D (Ergocalciferol) (DRISDOL) capsule 50,000 Units  50,000 Units Oral Q Elveria Rising, MD   50,000 Units at 11/10/13 1820  . zolpidem (AMBIEN) tablet 10 mg  10 mg Oral QHS PRN Janece Canterbury, MD        Allergies as of 11/10/2013  . (No Known Allergies)    Family History  Problem Relation Age of Onset  . Prostate cancer Brother   . COPD Mother   . Heart disease Father   . Diabetes Father   . Alcohol abuse Father   . Liver cancer Brother   . Colon cancer Brother   . COPD Maternal Grandfather     History   Social History  . Marital Status: Divorced    Spouse Name: N/A    Number of Children: N/A  . Years of Education: N/A   Occupational History  . Not on file.   Social History Main Topics  . Smoking status: Former Smoker -- 20 years    Types: Cigars    Start date: 12/22/2005  . Smokeless tobacco: Former Systems developer    Types: Snuff     Comment: "quit smoking ~ 2009; quit snuff in ~ 2013"  . Alcohol Use: Yes     Comment: "quit in late April 2015"  . Drug Use: Yes     Comment: "tried IV drugs a couple times in the 1960's  or 1970's; nothing since"  . Sexual Activity: No   Other Topics Concern  . Not on file   Social History Narrative  . No narrative on file    Review of Systems: Somewhat poor appetite. Possible mild weight loss over the past year. I don't believe the patient is having issues with upper tract symptomatology such as reflux or dysphagia, nor lower GI complaints such as diarrhea or rectal bleeding, other than transient black stool several weeks ago which subsequently resolved.  Physical Exam: Vital signs in last 24 hours: Temp:  [97.5 F (36.4 C)-98 F (36.7 C)] 97.5 F (36.4 C) (08/14 1540) Pulse Rate:  [44-82] 82 (08/14 1540) Resp:  [10-18] 13 (08/14 1315) BP: (95-117)/(61-74) 117/74 mmHg (08/14 1540) SpO2:  [90 %-100 %] 98 % (08/14 1540) Weight:  [83.462 kg (184 lb)-83.915 kg (185 lb)] 83.462 kg (184 lb) (08/14 1500) Last BM Date: 11/09/13 The patient actually looked somewhat better than I expected, considering his medical problems. He is anicteric and without frank pallor. There is some palmar erythema, no spider angiomata. Chest is clear to auscultation, heart is without murmur or arrhythmia. Abdominal exam does not disclose palpable hepatosplenomegaly. Flank tympany is present suggesting the absence of ascites even though CT scanning demonstrates ascites to be present. No guarding, mass effect, or tenderness. Lower 70s have some chronic stasis changes suggestive of previous edema but there is currently no edema present. The patient appears to be cognitively intact and certainly is not frankly encephalopathic. No obvious focal neurologic deficits.  Intake/Output from previous day:   Intake/Output this shift:    Lab Results:  Recent Labs  11/10/13 0945  WBC 3.6*  HGB 13.7  HCT 38.0*  PLT 78*   BMET  Recent Labs  11/10/13 0945  NA  136*  K 4.2  CL 97  CO2 29  GLUCOSE 208*  BUN 11  CREATININE 0.64  CALCIUM 9.0   LFT  Recent Labs  11/10/13 0945  PROT 6.5   ALBUMIN 2.9*  AST 24  ALT 14  ALKPHOS 84  BILITOT 2.1*   PT/INR  Recent Labs  11/10/13 1800  LABPROT 16.8*  INR 1.36    Studies/Results: Ct Abdomen Pelvis W Contrast  11/10/2013   CLINICAL DATA:  Abdominal pain  EXAM: CT ABDOMEN AND PELVIS WITH CONTRAST  TECHNIQUE: Multidetector CT imaging of the abdomen and pelvis was performed using the standard protocol following bolus administration of intravenous contrast. Oral contrast was also administered.  CONTRAST:  20mL OMNIPAQUE IOHEXOL 300 MG/ML  SOLN  COMPARISON:  February 03, 2013 CT abdomen and pelvis and MR abdomen September 15, 2013  FINDINGS: There is no lung base edema or consolidation. There are multiple varices noted in the periesophageal regions as well as in the perigastric regions between the liver and stomach in the upper abdomen. This appearance is stable.  The liver has a nodular irregular contour with enlargement of the caudate lobe and relative hypoplasia of the remainder of the liver consistent with cirrhosis. There is again noted subtle enhancement of an area in the dome of the liver on the right, measuring 2.4 by 1.9 cm, stable. There is a second subtle area of enhancement in the medial segment of the left lobe of the liver measuring 3.1 x 2.1 cm, not seen on the prior CT and not convincingly seen on recent prior MR. There is a third area of enhancement along the posterior segment of the right lobe of the liver measuring 2.9 x 1.9 cm, slightly better seen at this time compared to the prior CT and present on recent MR. The overall attenuation of the liver is somewhat inhomogeneous consistent with the underlying cirrhosis.  There is a gallstone within the gallbladder. The gallbladder wall is not thickened. There is no biliary duct dilatation.  Spleen is enlarged, measuring 16.3 x 14.1 x 9.3 cm. There are no focal splenic lesions.  Pancreas and adrenals appear normal. Kidneys bilaterally show no hydronephrosis on either side. There is no  renal or ureteral calculus on either side. There is again noted a cyst arising from the anterior right kidney lower pole region measuring 2.3 x 2.1 cm. No new renal masses are appreciable.  There is moderate generalized ascites.  In the pelvis, there is thickening of the wall of the urinary bladder. Prostate is prominent and inhomogeneous in attenuation. Fluid tracks through inguinal hernias bilaterally from the pelvis into each scrotal sac, more on the right than on the left. There is no pelvic mass. Appendix appears normal.  There are perirectal varices. There is some wall thickening in the distal sigmoid colon and rectum of uncertain etiology.  There is no appreciable bowel obstruction. There is no free air or portal venous air. There are periumbilical varices noted.  There is no adenopathy or abscess appreciable.  There is an area of apparent thrombus in the portal vein just beyond the confluence of the splenic and superior mesenteric veins.  There is atherosclerotic change in the aorta but no aneurysm. There is degenerative change in the lumbar spine. There are no blastic or lytic bone lesions.  IMPRESSION: Changes of hepatic cirrhosis. 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 There are 3 demonstrable subtle lesions which show enhancement  within the liver as noted above. While these areas could represent dysplastic change, early hepatoma must be of concern. The lesions in the right lobe of the liver are noted on recent MR ; the area of concern in the left lobe may be new. If alpha-fetoprotein has not been performed, it would be reasonable to perform this laboratory study. There is mild generalized inhomogeneity to the attenuation of the liver, probably due to dysplastic change associated with the cirrhosis.  There are periesophageal, perigastric, and periumbilical varices. There is splenomegaly.  There is moderate ascites. Note that ascites  tracks through inguinal hernias in each scrotal sac, larger on the right than on the left.  There is enlargement of the prostate with inhomogeneity to the prostate attenuation. Prostate impinges upon the base of the bladder but does not convincingly invade the bladder. There is some thickening of the urinary bladder wall. The thickening in the urinary bladder wall could be secondary to cystitis. With respect to the prostate, correlation with PSA is warranted given this appearance.  There is cholelithiasis.  There is focal wall thickening in the distal sigmoid colon and rectum. There are nearby perirectal varices. The wall thickening in the distal sigmoid colon and rectum could be secondary to muscular hypertrophy and/or as a result of internal hemorrhoids secondary to the variceal changes in these areas. This finding may warrant direct visualization to exclude the possibility of underlying neoplasm, however.   Electronically Signed   By: Lowella Grip M.D.   On: 11/10/2013 13:18    Impression: 1. Cirrhosis, becoming decompensated as evidenced by ascites requiring his first ever large-volume paracentesis a week ago 2. Abdominal pain, postural in occurrence but not radicular in character. It is not characteristic of any identifiable GI process that I can identify 3. Questionable portal vein thrombosis. This is of unclear relevance to the patient's symptoms 4. Hepatocellular carcinoma, to begin treatment in the near future at [facility] (Dr.Ramos, apparently of interventional radiology)  Plan: I would obtain a hepatic Doppler study to try to determine more clearly whether or not a portal vein thrombosis is truly present and, if so, whether it is acute or chronic. This patient would be a marginal candidate for anticoagulation in view of his cirrhosis and large varices. Moreover, he would need to be off anticoagulation at the time of his interventional radiologic procedure 11 days from  now. For that reason, I think it is appropriate to hold off on anticoagulation for the time being. We probably will need to confer with his interventional radiologist and with the patient's primary gastroenterologist (Dr. Amedeo Plenty) on Monday, in light of the Doppler findings, to decide whether or not to embark on anticoagulation.  Time spent on this consultation, including radiologic review and conferring with patient's referring physician, was approximately an hour and 15 minutes.   LOS: 0 days   Gibril Mastro Wade  11/10/2013, 8:53 PM

## 2013-11-10 NOTE — Progress Notes (Signed)
ANTICOAGULATION CONSULT NOTE - Initial Consult  Pharmacy Consult for Lovenox Indication: VTE prophylaxis  No Known Allergies  Patient Measurements: Height: 5\' 10"  (177.8 cm) Weight: 184 lb (83.462 kg) IBW/kg (Calculated) : 73 Heparin Dosing Weight:   Vital Signs: Temp: 97.5 F (36.4 C) (08/14 1540) Temp src: Oral (08/14 1540) BP: 117/74 mmHg (08/14 1540) Pulse Rate: 82 (08/14 1540)  Labs:  Recent Labs  11/10/13 0945  HGB 13.7  HCT 38.0*  PLT 78*  CREATININE 0.64    Estimated Creatinine Clearance: 96.3 ml/min (by C-G formula based on Cr of 0.64).   Medical History: Past Medical History  Diagnosis Date  . Macrocytosis   . Thrombocytopenia   . Esophageal bleed, non-variceal 2006  . HTN (hypertension)   . Psoriasis   . Mitral valve prolapse     OCCAS FLUTTER FEELING  . Vitamin D deficiency   . Umbilical hernia     OCCAS DISCOMFORT  . Prostate enlargement     PT TOLD "NORMAL FOR AGE" - TAKES FINASTERIDE AND FLOMAX  . Heart murmur   . Type II diabetes mellitus   . Rheumatic fever 1950's  . Hepatitis C 1980's  . Arthritis     "joint stiffness in the knees" (11/10/2013)  . Liver cancer     Medications:  Scheduled:  . docusate sodium  100 mg Oral BID  . finasteride  5 mg Oral Daily  . [START ON 11/11/2013] furosemide  20 mg Oral Daily  . hydrochlorothiazide  50 mg Oral Daily  . insulin aspart  0-9 Units Subcutaneous TID WC  . insulin glargine  14 Units Subcutaneous Daily  . nadolol  20 mg Oral QHS  . spironolactone  50 mg Oral Daily  . tamsulosin  0.4 mg Oral Daily  . Vitamin D (Ergocalciferol)  50,000 Units Oral Q Fri    Assessment: 63yo male with Chronic Portal Vein thrombus as well as (+)HepC, alcoholic cirrhosis, (+)esophageal and rectal varices, and s/p his first therapeutic thoracentesis last week.  Hg 13.7, pltc a bit low at 137 (chronic d/t liver dz).  Cr < 1  Goal of Therapy:  Prevention of clot Monitor platelets by anticoagulation protocol:  Yes   Plan:  1-  Lovenox 40mg  SQ q24 2-  F/U further studies and potential need for anticoagulation 3-  Watch for s/s bleeding  Gracy Bruins, PharmD Clinical Pharmacist [facility]

## 2013-11-10 NOTE — ED Provider Notes (Signed)
CSN: [id number]     Arrival date & time 11/10/13  0903 History   First MD Initiated Contact with Patient 11/10/13 [phone]     Chief Complaint  Patient presents with  . Abdominal Pain     (Consider location/radiation/quality/duration/timing/severity/associated sxs/prior Treatment) HPI Comments: Patient with past medical history of hypertension, diabetes, hepatitis C, cirrhosis, presents to the emergency department with chief complaint of abdominal pain. Patient states that he had a recent paracentesis, and which 5 L of fluid was removed from his abdomen last Friday because of his cirrhosis. He reports that he is having new and worsening abdominal pain. He has a history of abdominal hernias,, and states that this pain feels similar. He denies fevers, chills, vomiting, diarrhea, or constipation. States that his last bowel movement was yesterday. States that it was a little dark, but he denies seeing any blood. He does report some associated nausea. The pain does not radiate. It is worsened with standing and moving. It is better with rest. His abdominal surgeon is Dr. Excell Seltzer.  The history is provided by the patient. No language interpreter was used.    Past Medical History  Diagnosis Date  . Macrocytosis   . Thrombocytopenia   . Hepatitis C 1980's  . Esophageal bleed, non-variceal 2006  . HTN (hypertension)   . Psoriasis   . Mitral valve prolapse     OCCAS FLUTTER FEELING  . Vitamin D deficiency   . Diabetes mellitus     DIET CONTROL  . Arthritis   . Umbilical hernia     OCCAS DISCOMFORT  . Prostate enlargement     PT TOLD "NORMAL FOR AGE" - TAKES FINASTERIDE AND FLOMAX   Past Surgical History  Procedure Laterality Date  . Tonsilectomy, adenoidectomy, bilateral myringotomy and tubes  child  . Inguinal hernia repair  1970's    left  . Umbilical hernia repair  03/09/2012    Procedure: HERNIA REPAIR UMBILICAL ADULT;  Surgeon: Edward Jolly, MD;  Location: [facility];  Service:  General;  Laterality: N/A;  Repair Umbilical Hernia with Mesh  . Insertion of mesh  03/09/2012    Procedure: INSERTION OF MESH;  Surgeon: Edward Jolly, MD;  Location: [facility];  Service: General;  Laterality: N/A;   Family History  Problem Relation Age of Onset  . Prostate cancer Brother   . COPD Mother   . Heart disease Father   . Diabetes Father   . Alcohol abuse Father    History  Substance Use Topics  . Smoking status: Former Smoker    Types: Cigars    Start date: 12/22/2005    Quit date: 10/06/2008  . Smokeless tobacco: Never Used     Comment: QUIT CIGARETTES ABOU 2003  . Alcohol Use: No     Comment: no longer    Review of Systems  All other systems reviewed and are negative.     Allergies  Review of patient's allergies indicates no known allergies.  Home Medications   Prior to Admission medications   Medication Sig Start Date End Date Taking? Authorizing Provider  Carboxymethylcellul-Glycerin (CLEAR EYES FOR DRY EYES) 1-0.25 % SOLN Apply 1-2 drops to eye daily as needed. For dry eyes    Historical Provider, MD  Clobetasol Propionate 0.05 % lotion Apply 1 application topically 2 (two) times daily. Apply to affected areas    Historical Provider, MD  finasteride (PROSCAR) 5 MG tablet Take 5 mg by mouth daily.    Historical Provider, MD  ibuprofen (  ADVIL,MOTRIN) 600 MG tablet Take 600 mg by mouth every 6 (six) hours as needed. For pain    Historical Provider, MD  nadolol (CORGARD) 20 MG tablet Take 20 mg by mouth at bedtime. 03/26/11 03/25/12  Ileene Patrick, MD  Tamsulosin HCl (FLOMAX) 0.4 MG CAPS Take 0.4 mg by mouth daily.    Historical Provider, MD  Vitamin D, Ergocalciferol, (DRISDOL) 50000 UNITS CAPS Take 50,000 Units by mouth every Friday.    Historical Provider, MD  zolpidem (AMBIEN) 10 MG tablet Take 10 mg by mouth at bedtime as needed. For sleep    Historical Provider, MD   BP 110/71  Pulse 55  Temp(Src) 97.6 F (36.4 C) (Oral)  Resp 16  Ht 5\' 10"   (1.778 m)  Wt 185 lb (83.915 kg)  BMI 26.54 kg/m2  SpO2 97% Physical Exam  Nursing note and vitals reviewed. Constitutional: He is oriented to person, place, and time. He appears well-developed and well-nourished.  HENT:  Head: Normocephalic and atraumatic.  Eyes: Conjunctivae and EOM are normal. Pupils are equal, round, and reactive to light. Right eye exhibits no discharge. Left eye exhibits no discharge. No scleral icterus.  Neck: Normal range of motion. Neck supple. No JVD present.  Cardiovascular: Regular rhythm and normal heart sounds.  Exam reveals no gallop and no friction rub.   No murmur heard. bradycardic  Pulmonary/Chest: Effort normal and breath sounds normal. No respiratory distress. He has no wheezes. He has no rales. He exhibits no tenderness.  Abdominal: Soft. He exhibits no distension and no mass. There is tenderness. There is no rebound and no guarding.  Abdomen is diffusely tender to palpation, nondistended, no obvious fluid wave  Musculoskeletal: Normal range of motion. He exhibits no edema and no tenderness.  Neurological: He is alert and oriented to person, place, and time.  Skin: Skin is warm and dry.  Psychiatric: He has a normal mood and affect. His behavior is normal. Judgment and thought content normal.    ED Course  Procedures (including critical care time) Results for orders placed during the hospital encounter of 11/10/13  URINALYSIS, ROUTINE W REFLEX MICROSCOPIC      Result Value Ref Range   Color, Urine AMBER (*) YELLOW   APPearance CLEAR  CLEAR   Specific Gravity, Urine 1.010  1.005 - 1.030   pH 7.0  5.0 - 8.0   Glucose, UA NEGATIVE  NEGATIVE mg/dL   Hgb urine dipstick NEGATIVE  NEGATIVE   Bilirubin Urine SMALL (*) NEGATIVE   Ketones, ur NEGATIVE  NEGATIVE mg/dL   Protein, ur NEGATIVE  NEGATIVE mg/dL   Urobilinogen, UA 1.0  0.0 - 1.0 mg/dL   Nitrite NEGATIVE  NEGATIVE   Leukocytes, UA NEGATIVE  NEGATIVE  CBC WITH DIFFERENTIAL      Result  Value Ref Range   WBC 3.6 (*) 4.0 - 10.5 K/uL   RBC 3.83 (*) 4.22 - 5.81 MIL/uL   Hemoglobin 13.7  13.0 - 17.0 g/dL   HCT 38.0 (*) 39.0 - 52.0 %   MCV 99.2  78.0 - 100.0 fL   MCH 35.8 (*) 26.0 - 34.0 pg   MCHC 36.1 (*) 30.0 - 36.0 g/dL   RDW 12.3  11.5 - 15.5 %   Platelets 78 (*) 150 - 400 K/uL   Neutrophils Relative % 58  43 - 77 %   Neutro Abs 2.1  1.7 - 7.7 K/uL   Lymphocytes Relative 22  12 - 46 %   Lymphs Abs 0.8  0.7 - 4.0 K/uL   Monocytes Relative 15 (*) 3 - 12 %   Monocytes Absolute 0.5  0.1 - 1.0 K/uL   Eosinophils Relative 4  0 - 5 %   Eosinophils Absolute 0.2  0.0 - 0.7 K/uL   Basophils Relative 1  0 - 1 %   Basophils Absolute 0.0  0.0 - 0.1 K/uL  COMPREHENSIVE METABOLIC PANEL      Result Value Ref Range   Sodium 136 (*) 137 - 147 mEq/L   Potassium 4.2  3.7 - 5.3 mEq/L   Chloride 97  96 - 112 mEq/L   CO2 29  19 - 32 mEq/L   Glucose, Bld 208 (*) 70 - 99 mg/dL   BUN 11  6 - 23 mg/dL   Creatinine, Ser 0.64  0.50 - 1.35 mg/dL   Calcium 9.0  8.4 - 10.5 mg/dL   Total Protein 6.5  6.0 - 8.3 g/dL   Albumin 2.9 (*) 3.5 - 5.2 g/dL   AST 24  0 - 37 U/L   ALT 14  0 - 53 U/L   Alkaline Phosphatase 84  39 - 117 U/L   Total Bilirubin 2.1 (*) 0.3 - 1.2 mg/dL   GFR calc non Af Amer >90  >90 mL/min   GFR calc Af Amer >90  >90 mL/min   Anion gap 10  5 - 15  LIPASE, BLOOD      Result Value Ref Range   Lipase 29  11 - 59 U/L   Ct Abdomen Pelvis W Contrast  11/10/2013   CLINICAL DATA:  Abdominal pain  EXAM: CT ABDOMEN AND PELVIS WITH CONTRAST  TECHNIQUE: Multidetector CT imaging of the abdomen and pelvis was performed using the standard protocol following bolus administration of intravenous contrast. Oral contrast was also administered.  CONTRAST:  36mL OMNIPAQUE IOHEXOL 300 MG/ML  SOLN  COMPARISON:  February 03, 2013 CT abdomen and pelvis and MR abdomen September 15, 2013  FINDINGS: There is no lung base edema or consolidation. There are multiple varices noted in the periesophageal  regions as well as in the perigastric regions between the liver and stomach in the upper abdomen. This appearance is stable.  The liver has a nodular irregular contour with enlargement of the caudate lobe and relative hypoplasia of the remainder of the liver consistent with cirrhosis. There is again noted subtle enhancement of an area in the dome of the liver on the right, measuring 2.4 by 1.9 cm, stable. There is a second subtle area of enhancement in the medial segment of the left lobe of the liver measuring 3.1 x 2.1 cm, not seen on the prior CT and not convincingly seen on recent prior MR. There is a third area of enhancement along the posterior segment of the right lobe of the liver measuring 2.9 x 1.9 cm, slightly better seen at this time compared to the prior CT and present on recent MR. The overall attenuation of the liver is somewhat inhomogeneous consistent with the underlying cirrhosis.  There is a gallstone within the gallbladder. The gallbladder wall is not thickened. There is no biliary duct dilatation.  Spleen is enlarged, measuring 16.3 x 14.1 x 9.3 cm. There are no focal splenic lesions.  Pancreas and adrenals appear normal. Kidneys bilaterally show no hydronephrosis on either side. There is no renal or ureteral calculus on either side. There is again noted a cyst arising from the anterior right kidney lower pole region measuring 2.3 x 2.1 cm. No new  renal masses are appreciable.  There is moderate generalized ascites.  In the pelvis, there is thickening of the wall of the urinary bladder. Prostate is prominent and inhomogeneous in attenuation. Fluid tracks through inguinal hernias bilaterally from the pelvis into each scrotal sac, more on the right than on the left. There is no pelvic mass. Appendix appears normal.  There are perirectal varices. There is some wall thickening in the distal sigmoid colon and rectum of uncertain etiology.  There is no appreciable bowel obstruction. There is no free  air or portal venous air. There are periumbilical varices noted.  There is no adenopathy or abscess appreciable.  There is an area of apparent thrombus in the portal vein just beyond the confluence of the splenic and superior mesenteric veins.  There is atherosclerotic change in the aorta but no aneurysm. There is degenerative change in the lumbar spine. There are no blastic or lytic bone lesions.  IMPRESSION: Changes of hepatic cirrhosis. 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 There are 3 demonstrable subtle lesions which show enhancement within the liver as noted above. While these areas could represent dysplastic change, early hepatoma must be of concern. The lesions in the right lobe of the liver are noted on recent MR ; the area of concern in the left lobe may be new. If alpha-fetoprotein has not been performed, it would be reasonable to perform this laboratory study. There is mild generalized inhomogeneity to the attenuation of the liver, probably due to dysplastic change associated with the cirrhosis.  There are periesophageal, perigastric, and periumbilical varices. There is splenomegaly.  There is moderate ascites. Note that ascites tracks through inguinal hernias in each scrotal sac, larger on the right than on the left.  There is enlargement of the prostate with inhomogeneity to the prostate attenuation. Prostate impinges upon the base of the bladder but does not convincingly invade the bladder. There is some thickening of the urinary bladder wall. The thickening in the urinary bladder wall could be secondary to cystitis. With respect to the prostate, correlation with PSA is warranted given this appearance.  There is cholelithiasis.  There is focal wall thickening in the distal sigmoid colon and rectum. There are nearby perirectal varices. The wall thickening in the distal sigmoid colon and rectum could be secondary to muscular  hypertrophy and/or as a result of internal hemorrhoids secondary to the variceal changes in these areas. This finding may warrant direct visualization to exclude the possibility of underlying neoplasm, however.   Electronically Signed   By: Lowella Grip M.D.   On: 11/10/2013 13:18   US Paracentesis  11/03/2013   CLINICAL DATA:  Hepatic masses, cirrhosis, hepatitis-C, ascites. Request is made for therapeutic paracentesis up to 5 liters.  EXAM: ULTRASOUND GUIDED THERAPEUTIC PARACENTESIS  COMPARISON:  None.  PROCEDURE: An ultrasound guided paracentesis was thoroughly discussed with the patient and questions answered. The benefits, risks, alternatives and complications were also discussed. The patient understands and wishes to proceed with the procedure. Written consent was obtained.  Ultrasound was performed to localize and mark an adequate pocket of fluid in the right lower quadrant of the abdomen. The area was then prepped and draped in the normal sterile fashion. 1% Lidocaine was used for local anesthesia. Under ultrasound guidance a 19 gauge Yueh catheter was introduced. Paracentesis was performed. The catheter was removed and a dressing applied.  Complications: None.  FINDINGS: A total of approximately 5 liters  of yellow fluid was removed.  IMPRESSION: Successful ultrasound guided therapeutic paracentesis yielding 5 liters of ascites. The patient received IV albumin preprocedure.  Read by: [place]e [place], PA-C   Electronically Signed   By: Arne Cleveland M.D.   On: 11/03/2013 12:49      EKG Interpretation None      MDM   Final diagnoses:  Abdominal pain, unspecified abdominal location  Thrombus  Cirrhosis of liver with ascites, unspecified hepatic cirrhosis type    Patient with worsening abdominal pain following paracentesis. He has had several abdominal surgeries for hernia repairs. Some concern for obstruction. Will order a CT scan, treat pain, check labs. Will reassess.  Patient seen by  and discussed with Dr. Regenia Skeeter.    CT remarkable for several findings as above.  Portal vein thrombus and repeat ascites.  Discussed the patient with Dr. Regenia Skeeter, who recommends admission for further treatment of the portal vein thrombus.     Montine Circle, PA-C 11/10/13 [phone]

## 2013-11-10 NOTE — ED Notes (Signed)
Patient transported to CT 

## 2013-11-10 NOTE — H&P (Signed)
[facility] History and Physical  Ethan Wade EXB:[id number] DOB: 30-Nov-1950 DOA: 11/10/2013  Referring physician:  Montine Circle PCP:  Gerrit Heck, MD   Chief Complaint:  Abdominal pain  HPI:  The patient is a 63 y.o. 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 Amedeo Plenty, chronic thrombocytopenia, ascites, hepatoma referred to [facility] for possible liver transplant, hypertension, diabetes mellitus, BPH, inguinal hernias who presents with abdominal pain.  The patient was last at their baseline health several weeks ago when he developed increasing abdominal swelling.  One week ago, he underwent his first ever therapeutic paracentesis with removal of 5L of fluid with symptomatic improvement.  Dr. Amedeo Plenty also called him in prescriptions for diuretics which he has been taking since that time.  The same day as his procedure, he developed epigastric pain which radiated to his back.  Pain is stabbing and progresses to 7-8/10 with movement/ambulation.  He has to walk hunched over due to the pain but when resting, he feels only mild discomfort.  Denies nausea, vomiting, diarrhea, constipation.  A few weeks ago, he had some black tarry stools which have since resolved.  Denies fevers, chills, dysuria, urinary frequency, urgency.    In the emergency department, he is mildly hypotensive at his baseline 95/63, heart rate 40s to 50s. Afebrile. White blood cell count 3.6, hemoglobin 13.7, platelets 78, similar to baseline except for slightly decreased hemoglobin from baseline.  AST and ALT are within normal limits, lower than baseline. Albumin 2.9, decreased from baseline of 3.2. Bilirubin 2.1, near baseline.  INR pending.    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  According to radiologist, he had  partial occlusion with some collaterals on his MRI, but his CT shows subtotal occlusion suggesting an acute-on-chronic picture.  He also has 3 enhancing liver lesions 2 of which were previously noted in the right lobe and a new area of concern in the left lobe. He has chronic esophageal, gastric, periumbilical varices with splenomegaly and ascites. His prostate appears to be enlarged as before. He has some bladder wall thickening that may suggest cystitis and urinalysis is pending. He also has focal wall thickening of the distal sigmoid colon and rectal which may be secondary to internal hemorrhoids although the radiologist is suggesting direct visualization to exclude neoplasm.    Review of Systems:  General:  Denies fevers, chills, weight loss or gain HEENT:  Denies changes to hearing and vision, rhinorrhea, sinus congestion, sore throat CV:  Denies chest pain and palpitations, lower extremity edema.  PULM:  Denies SOB, wheezing, cough.   GI:  Denies nausea, vomiting, constipation, diarrhea.   GU:  Denies dysuria, frequency, urgency ENDO:  Denies polyuria, polydipsia.   HEME:  Denies hematemesis, abnormal bruising LYMPH:  Denies lymphadenopathy.   MSK:  Denies arthralgias, myalgias.   DERM:  Chronic psoriasis, no ulcer.   NEURO:  Denies focal numbness, weakness, slurred speech, confusion, facial droop.  PSYCH:  Denies anxiety and depression.    Past Medical History  Diagnosis Date  . Macrocytosis   . Thrombocytopenia   . Hepatitis C 1980's  . Esophageal bleed, non-variceal 2006  . HTN (hypertension)   . Psoriasis   . Mitral valve prolapse     OCCAS FLUTTER FEELING  . Vitamin D deficiency   . Diabetes mellitus  DIET CONTROL  . Arthritis   . Umbilical hernia     OCCAS DISCOMFORT  . Prostate enlargement     PT TOLD "NORMAL FOR AGE" - TAKES FINASTERIDE AND FLOMAX   Past Surgical History  Procedure Laterality Date  . Tonsilectomy, adenoidectomy, bilateral myringotomy and tubes   child  . Inguinal hernia repair  1970's    left  . Umbilical hernia repair  03/09/2012    Procedure: HERNIA REPAIR UMBILICAL ADULT;  Surgeon: Edward Jolly, MD;  Location: [facility];  Service: General;  Laterality: N/A;  Repair Umbilical Hernia with Mesh  . Insertion of mesh  03/09/2012    Procedure: INSERTION OF MESH;  Surgeon: Edward Jolly, MD;  Location: [facility];  Service: General;  Laterality: N/A;   Social History:  reports that he quit smoking about 5 years ago. His smoking use included Cigars. He started smoking about 7 years ago. He has never used smokeless tobacco. He reports that he does not drink alcohol or use illicit drugs.   No Known Allergies  Family History  Problem Relation Age of Onset  . Prostate cancer Brother   . COPD Mother   . Heart disease Father   . Diabetes Father   . Alcohol abuse Father   . Liver cancer Brother   . Colon cancer Brother   . COPD Maternal Grandfather      Prior to Admission medications   Medication Sig Start Date End Date Taking? Authorizing Provider  Carboxymethylcellul-Glycerin (CLEAR EYES FOR DRY EYES) 1-0.25 % SOLN Apply 1-2 drops to eye daily as needed. For dry eyes   Yes Historical Provider, MD  finasteride (PROSCAR) 5 MG tablet Take 5 mg by mouth daily.   Yes Historical Provider, MD  furosemide (LASIX) 20 MG tablet Take 20 mg by mouth.   Yes Historical Provider, MD  ibuprofen (ADVIL,MOTRIN) 600 MG tablet Take 600 mg by mouth every 6 (six) hours as needed. For pain   Yes Historical Provider, MD  insulin glargine (LANTUS) 100 UNIT/ML injection Inject 14 Units into the skin daily.   Yes Historical Provider, MD  nadolol (CORGARD) 20 MG tablet Take 20 mg by mouth daily.   Yes Historical Provider, MD  spironolactone-hydrochlorothiazide (ALDACTAZIDE) 50-50 MG per tablet Take 1 tablet by mouth daily.   Yes Historical Provider, MD  Tamsulosin HCl (FLOMAX) 0.4 MG CAPS Take 0.4 mg by mouth daily.   Yes Historical Provider, MD  Vitamin  D, Ergocalciferol, (DRISDOL) 50000 UNITS CAPS Take 50,000 Units by mouth every Friday.   Yes Historical Provider, MD  zolpidem (AMBIEN) 10 MG tablet Take 10 mg by mouth at bedtime as needed. For sleep   Yes Historical Provider, MD  nadolol (CORGARD) 20 MG tablet Take 20 mg by mouth at bedtime. 03/26/11 03/25/12  Ileene Patrick, MD   Physical Exam: Filed Vitals:   11/10/13 1300 11/10/13 1315 11/10/13 1500 11/10/13 1540  BP: 105/70 112/67  117/74  Pulse: 47 44  82  Temp:   98 F (36.7 C) 97.5 F (36.4 C)  TempSrc:    Oral  Resp: 16 13    Height:   5\' 10"  (1.778 m)   Weight:   83.462 kg (184 lb)   SpO2: 100% 96%  98%     General:  WM, NAD  Eyes:  PERRL, anicteric, non-injected.  ENT:  Nares clear.  OP clear, non-erythematous without plaques or exudates.  MMM.  Neck:  Supple without TM or JVD.    Lymph:  No cervical, supraclavicular, or submandibular LAD.  Cardiovascular:  RRR, normal S1, S2, without m/r/g.  2+ pulses, warm extremities  Respiratory:  CTA bilaterally without increased WOB.  Abdomen:  NABS.  Soft, mild chronic TTP over umbilical region, but cannot reproduce abdominal pain that he has been feeling with palpation, nondistended  Skin:  Psoriatic rash on legs with silver scaling plaque, no ulcers.  Musculoskeletal:  Normal bulk and tone.  No LE edema.  Psychiatric:  A & O x 4.  Appropriate affect.  Neurologic:  CN 3-12 intact.  5/5 strength.  Sensation intact.  Labs on Admission:  Basic Metabolic Panel:  Recent Labs Lab 11/10/13 0945  NA 136*  K 4.2  CL 97  CO2 29  GLUCOSE 208*  BUN 11  CREATININE 0.64  CALCIUM 9.0   Liver Function Tests:  Recent Labs Lab 11/10/13 0945  AST 24  ALT 14  ALKPHOS 84  BILITOT 2.1*  PROT 6.5  ALBUMIN 2.9*    Recent Labs Lab 11/10/13 0945  LIPASE 29   No results found for this basename: AMMONIA,  in the last 168 hours CBC:  Recent Labs Lab 11/10/13 0945  WBC 3.6*  NEUTROABS 2.1  HGB 13.7  HCT  38.0*  MCV 99.2  PLT 78*   Cardiac Enzymes: No results found for this basename: CKTOTAL, CKMB, CKMBINDEX, TROPONINI,  in the last 168 hours  BNP (last 3 results) No results found for this basename: PROBNP,  in the last 8760 hours CBG: No results found for this basename: GLUCAP,  in the last 168 hours  Radiological Exams on Admission: Ct Abdomen Pelvis W Contrast  11/10/2013   CLINICAL DATA:  Abdominal pain  EXAM: CT ABDOMEN AND PELVIS WITH CONTRAST  TECHNIQUE: Multidetector CT imaging of the abdomen and pelvis was performed using the standard protocol following bolus administration of intravenous contrast. Oral contrast was also administered.  CONTRAST:  56mL OMNIPAQUE IOHEXOL 300 MG/ML  SOLN  COMPARISON:  February 03, 2013 CT abdomen and pelvis and MR abdomen September 15, 2013  FINDINGS: There is no lung base edema or consolidation. There are multiple varices noted in the periesophageal regions as well as in the perigastric regions between the liver and stomach in the upper abdomen. This appearance is stable.  The liver has a nodular irregular contour with enlargement of the caudate lobe and relative hypoplasia of the remainder of the liver consistent with cirrhosis. There is again noted subtle enhancement of an area in the dome of the liver on the right, measuring 2.4 by 1.9 cm, stable. There is a second subtle area of enhancement in the medial segment of the left lobe of the liver measuring 3.1 x 2.1 cm, not seen on the prior CT and not convincingly seen on recent prior MR. There is a third area of enhancement along the posterior segment of the right lobe of the liver measuring 2.9 x 1.9 cm, slightly better seen at this time compared to the prior CT and present on recent MR. The overall attenuation of the liver is somewhat inhomogeneous consistent with the underlying cirrhosis.  There is a gallstone within the gallbladder. The gallbladder wall is not thickened. There is no biliary duct dilatation.   Spleen is enlarged, measuring 16.3 x 14.1 x 9.3 cm. There are no focal splenic lesions.  Pancreas and adrenals appear normal. Kidneys bilaterally show no hydronephrosis on either side. There is no renal or ureteral calculus on either side. There is again noted a cyst arising  from the anterior right kidney lower pole region measuring 2.3 x 2.1 cm. No new renal masses are appreciable.  There is moderate generalized ascites.  In the pelvis, there is thickening of the wall of the urinary bladder. Prostate is prominent and inhomogeneous in attenuation. Fluid tracks through inguinal hernias bilaterally from the pelvis into each scrotal sac, more on the right than on the left. There is no pelvic mass. Appendix appears normal.  There are perirectal varices. There is some wall thickening in the distal sigmoid colon and rectum of uncertain etiology.  There is no appreciable bowel obstruction. There is no free air or portal venous air. There are periumbilical varices noted.  There is no adenopathy or abscess appreciable.  There is an area of apparent thrombus in the portal vein just beyond the confluence of the splenic and superior mesenteric veins.  There is atherosclerotic change in the aorta but no aneurysm. There is degenerative change in the lumbar spine. There are no blastic or lytic bone lesions.  IMPRESSION: Changes of hepatic cirrhosis. 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 There are 3 demonstrable subtle lesions which show enhancement within the liver as noted above. While these areas could represent dysplastic change, early hepatoma must be of concern. The lesions in the right lobe of the liver are noted on recent MR ; the area of concern in the left lobe may be new. If alpha-fetoprotein has not been performed, it would be reasonable to perform this laboratory study. There is mild generalized inhomogeneity to the attenuation of the liver,  probably due to dysplastic change associated with the cirrhosis.  There are periesophageal, perigastric, and periumbilical varices. There is splenomegaly.  There is moderate ascites. Note that ascites tracks through inguinal hernias in each scrotal sac, larger on the right than on the left.  There is enlargement of the prostate with inhomogeneity to the prostate attenuation. Prostate impinges upon the base of the bladder but does not convincingly invade the bladder. There is some thickening of the urinary bladder wall. The thickening in the urinary bladder wall could be secondary to cystitis. With respect to the prostate, correlation with PSA is warranted given this appearance.  There is cholelithiasis.  There is focal wall thickening in the distal sigmoid colon and rectum. There are nearby perirectal varices. The wall thickening in the distal sigmoid colon and rectum could be secondary to muscular hypertrophy and/or as a result of internal hemorrhoids secondary to the variceal changes in these areas. This finding may warrant direct visualization to exclude the possibility of underlying neoplasm, however.   Electronically Signed   By: Lowella Grip M.D.   On: 11/10/2013 13:18    EKG:  pending  Assessment/Plan Principal Problem:   Abdominal pain, epigastric Active Problems:   Thrombocytopenia   Hepatitis C   Esophageal bleed, non-variceal   HTN (hypertension)   Psoriasis   Prostate enlargement   Leukopenia   Hyponatremia   PVT (portal vein thrombosis)  ---  Epigastric abdominal pain may be due to possible acute on chronic portal vein thrombosis vs. SBP -  Diagnostic paracentesis -  Will need to start anticoagulation, however, if he has evidence of bleeding (occult positive), may need to undergo EGD/colo for banding prior to starting A/C -  GI consult:  Spoke with Dr. Cristina Gong who is going to review case and call with recommendations later -  Check ECG  Hepatitis C and alcoholic cirrhosis  with esophageal and rectal varices and ascites.  Banded about 7 years ago, last EGD/colo in 2011.  Never been treated for HCV.  Dr. Samara Deist is primary. -  Contiue lasix, spironolactone, HCTZ -  Continue nadolol -  Daily weights  Known hepatomas, referred to [facility] for possible liver transplant but declined due to location of lesions.  Possible new lesion in left lobe.   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 Varices -   GI consult pending  Hypertension, will place hold parameter on nadolol  Diabetes mellitus type 2 without complication -  F8B -  Continue lantus -  Add low dose SSI  BPH, enlarged prostate on CT, continue finasteride and flomax  Inguinal hernias and previously repaired umbilical hernia, followed by Dr. Excell Seltzer.  Surgery for more recent hernias deferred due to other medical problems.  Leukopenia, followed by oncology.  Chronic.    Chronic thrombocytopenia due to cirrhosis, at baseline -  Trend platelets  Mild hyponatremia due to cirrhosis, asymptomatic.  Trend sodium.  Diet:  CLD Access:  PIV IVF:  off Proph:  SCDs pending occult stool, then probable full dose A/C, possibly with heparin in case GI decides to scope during this admission  Code Status: Full Family Communication: patient alone Disposition Plan: Admit to med-surg  Time spent: 60 min Anniebell Bedore, [facility] Pager [phone]  If 7PM-7AM, please contact night-coverage [email or web address] Password [facility] 11/10/2013, 3:55 PM

## 2013-11-10 NOTE — Progress Notes (Signed)
BP 105/66, HR 53, MD notified, will hold tonight's dose of nadolol per MD order.

## 2013-11-10 NOTE — ED Notes (Signed)
Pt c/o peri-umbilical pain since having 5 L fluid removed from abdomen on Friday.  Only relief is when he lays down.  States pain is similar to what he experienced before he had umbilical hernia repaired.  No abdominal distension or LE edema noted.

## 2013-11-11 ENCOUNTER — Inpatient Hospital Stay (HOSPITAL_COMMUNITY): Payer: PRIVATE HEALTH INSURANCE

## 2013-11-11 DIAGNOSIS — D696 Thrombocytopenia, unspecified: Secondary | ICD-10-CM

## 2013-11-11 DIAGNOSIS — I81 Portal vein thrombosis: Secondary | ICD-10-CM

## 2013-11-11 DIAGNOSIS — N400 Enlarged prostate without lower urinary tract symptoms: Secondary | ICD-10-CM

## 2013-11-11 LAB — HEMOGLOBIN A1C
HEMOGLOBIN A1C: 6.7 % — AB (ref <5.7)
Mean Plasma Glucose: 146 mg/dL — ABNORMAL HIGH (ref <117)

## 2013-11-11 LAB — CBC
HEMATOCRIT: 36.8 % — AB (ref 39.0–52.0)
Hemoglobin: 13.4 g/dL (ref 13.0–17.0)
MCH: 36.3 pg — AB (ref 26.0–34.0)
MCHC: 36.4 g/dL — ABNORMAL HIGH (ref 30.0–36.0)
MCV: 99.7 fL (ref 78.0–100.0)
Platelets: 77 10*3/uL — ABNORMAL LOW (ref 150–400)
RBC: 3.69 MIL/uL — ABNORMAL LOW (ref 4.22–5.81)
RDW: 12.4 % (ref 11.5–15.5)
WBC: 3.3 10*3/uL — ABNORMAL LOW (ref 4.0–10.5)

## 2013-11-11 LAB — BODY FLUID CELL COUNT WITH DIFFERENTIAL
Lymphs, Fluid: 72 %
Monocyte-Macrophage-Serous Fluid: 25 % — ABNORMAL LOW (ref 50–90)
Neutrophil Count, Fluid: 3 % (ref 0–25)
Total Nucleated Cell Count, Fluid: 323 cu mm (ref 0–1000)

## 2013-11-11 LAB — COMPREHENSIVE METABOLIC PANEL
ALBUMIN: 2.8 g/dL — AB (ref 3.5–5.2)
ALK PHOS: 71 U/L (ref 39–117)
ALT: 13 U/L (ref 0–53)
AST: 25 U/L (ref 0–37)
Anion gap: 9 (ref 5–15)
BUN: 10 mg/dL (ref 6–23)
CHLORIDE: 100 meq/L (ref 96–112)
CO2: 28 mEq/L (ref 19–32)
Calcium: 9 mg/dL (ref 8.4–10.5)
Creatinine, Ser: 0.61 mg/dL (ref 0.50–1.35)
GFR calc Af Amer: >90 mL/min (ref >90)
GFR calc non Af Amer: >90 mL/min (ref >90)
Glucose, Bld: 84 mg/dL (ref 70–99)
POTASSIUM: 3.9 meq/L (ref 3.7–5.3)
Sodium: 137 mEq/L (ref 137–147)
Total Bilirubin: 2.4 mg/dL — ABNORMAL HIGH (ref 0.3–1.2)
Total Protein: 6.2 g/dL (ref 6.0–8.3)

## 2013-11-11 LAB — AFP TUMOR MARKER: AFP-Tumor Marker: 97 ng/mL — ABNORMAL HIGH (ref 0.0–8.0)

## 2013-11-11 LAB — GLUCOSE, CAPILLARY
GLUCOSE-CAPILLARY: 97 mg/dL (ref 70–99)
GLUCOSE-CAPILLARY: 263 mg/dL — AB (ref 70–99)
Glucose-Capillary: 108 mg/dL — ABNORMAL HIGH (ref 70–99)

## 2013-11-11 MED ORDER — LIDOCAINE HCL (PF) 1 % IJ SOLN
INTRAMUSCULAR | Status: AC
  Filled 2013-11-11: qty 5

## 2013-11-11 MED ORDER — GLUCERNA SHAKE PO LIQD
237.0000 mL | Freq: Three times a day (TID) | ORAL | Status: DC
  Administered 2013-11-11 – 2013-11-12 (×3): 237 mL via ORAL

## 2013-11-11 NOTE — Progress Notes (Signed)
Paracentesis showed essentially no fluid had reaccumulated since his paracentesis a week ago.  Doppler ultrasound of the portal vein warmed, but the reading is still pending.  The patient has been basically pain-free, but has been in bed. There was mild discomfort after eating lunch.  Impression: I remained unclear as to whether his abdominal pain is of GI tract (bursa role) origin, versus body wall (somatic) origin (such as a pinched nerve).  Recommendation:  1. Await ultrasound results 2. Consider physical therapy consult to see if there could be a musculoskeletal basis for the patient's abdominal discomfort. 3. Discharge okay if the patient can ambulate without pain, although based on his recent history, it is unlikely that will be the case. 4. Discussion regarding anticoagulation to be done in conjunction with his primary gastroenterologist (Dr. Teena Irani) and his physician at [facility] who is treating his hepatocellular carcinoma.  Cleotis Nipper, M.D. [phone]

## 2013-11-11 NOTE — Procedures (Signed)
Only small volume of perihepatic and deep pelvic ascites noted US guided paracentesis from (R)suprapubic region yielded only 3cc clear yellow fluid No immediate complications.  Pt tolerated well.   Specimen was sent for labs.  Ascencion Dike PA-C 11/11/2013 11:57 AM

## 2013-11-11 NOTE — Progress Notes (Signed)
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 SBP  - Diagnostic paracentesis done today, followup studies - GI consulted, recommended to hold off anticoagulation for the time being,will need to confer with IR and with patient's primary gastroenterologist (Dr. Amedeo Plenty) on Monday, in light of the Doppler findings, to decide on anticoagulation.  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 Banded about 7 years ago, last EGD/colo in 2011. Never been treated for HCV..  - Contiue lasix, spironolactone, HCTZ  - Continue nadolol   Hx of hepatocellular carcinoma referred to [facility] for possible liver transplant but declined due to location of lesions. Possible new lesion in left lobe.  - Planning for future embolization   Possible rectal mass vs. Varices  - Will follow GI recommendations  Hypertension - Currently borderline, continue following closely, on nadolol   Diabetes mellitus type 2 without complication  - Followup A1c , continue lantus and sliding scale insulin  BPH, enlarged prostate on CT, continue finasteride and flomax  - Check PSA  Inguinal hernias and previously repaired umbilical hernia, followed by Dr. Excell Seltzer. Surgery for more recent hernias deferred due to other medical problems.   Leukopenia, followed by oncology. Chronic.   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 Trend sodium.  DVT Prophylaxis: SCDs  Code Status: Full code  Family Communication:  Disposition:  Consultants:  Gastroenterology  Procedures:  Paracentesis  Antibiotics:  None  Subjective: Patient seen and examined, still has abdominal pain in  periumbilical area. No nausea, vomiting, diarrhea, fevers or chills  Objective: Weight change:   Intake/Output Summary (Last 24 hours) at 11/11/13 1327 Last data filed at 11/11/13 0900  Gross per 24 hour  Intake      0 ml  Output    200 ml  Net   -200 ml   Blood pressure 104/70, pulse 58, temperature 98 F (36.7 C), temperature source Oral, resp. rate 16, height 5\' 10"  (1.778 m), weight 71.215 kg (157 lb), SpO2 94.00%.  Physical Exam: General: Alert and awake, oriented x3, not in any acute distress. CVS: S1-S2 clear, no murmur rubs or gallops  Chest: clear to auscultation bilaterally, no wheezing, rales or rhonchi Abdomen: soft tenderness at umbilical region, ventral hernia Extremities: no cyanosis, clubbing, silver scaling plaques on the legs Neuro: Cranial nerves II-XII intact, no focal neurological deficits  Lab Results: Basic Metabolic Panel:  Recent Labs Lab 11/10/13 0945 11/11/13 0451  NA 136* 137  K 4.2 3.9  CL 97 100  CO2 29 28  GLUCOSE 208* 84  BUN 11 10  CREATININE 0.64 0.61  CALCIUM 9.0 9.0   Liver Function Tests:  Recent Labs Lab 11/10/13 0945 11/11/13 0451  AST 24 25  ALT 14 13  ALKPHOS 84 71  BILITOT 2.1* 2.4*  PROT 6.5 6.2  ALBUMIN 2.9* 2.8*    Recent Labs Lab 11/10/13 0945  LIPASE 29   No results found for this basename: AMMONIA,  in the last 168 hours CBC:  Recent Labs Lab 11/10/13 0945 11/11/13 0451  WBC 3.6* 3.3*  NEUTROABS 2.1  --   HGB 13.7 13.4  HCT 38.0* 36.8*  MCV 99.2 99.7  PLT 78* 77*   Cardiac  Enzymes: No results found for this basename: CKTOTAL, CKMB, CKMBINDEX, TROPONINI,  in the last 168 hours BNP: No components found with this basename: POCBNP,  CBG:  Recent Labs Lab 11/10/13 1734 11/10/13 2120 11/11/13 0738 11/11/13 1305  GLUCAP 124* 185* 97 108*     Micro Results: No results found for this or any previous visit (from the past 240 hour(s)).  Studies/Results: Ct Abdomen Pelvis W  Contrast  11/10/2013   CLINICAL DATA:  Abdominal pain  EXAM: CT ABDOMEN AND PELVIS WITH CONTRAST  TECHNIQUE: Multidetector CT imaging of the abdomen and pelvis was performed using the standard protocol following bolus administration of intravenous contrast. Oral contrast was also administered.  CONTRAST:  36mL OMNIPAQUE IOHEXOL 300 MG/ML  SOLN  COMPARISON:  February 03, 2013 CT abdomen and pelvis and MR abdomen September 15, 2013  FINDINGS: There is no lung base edema or consolidation. There are multiple varices noted in the periesophageal regions as well as in the perigastric regions between the liver and stomach in the upper abdomen. This appearance is stable.  The liver has a nodular irregular contour with enlargement of the caudate lobe and relative hypoplasia of the remainder of the liver consistent with cirrhosis. There is again noted subtle enhancement of an area in the dome of the liver on the right, measuring 2.4 by 1.9 cm, stable. There is a second subtle area of enhancement in the medial segment of the left lobe of the liver measuring 3.1 x 2.1 cm, not seen on the prior CT and not convincingly seen on recent prior MR. There is a third area of enhancement along the posterior segment of the right lobe of the liver measuring 2.9 x 1.9 cm, slightly better seen at this time compared to the prior CT and present on recent MR. The overall attenuation of the liver is somewhat inhomogeneous consistent with the underlying cirrhosis.  There is a gallstone within the gallbladder. The gallbladder wall is not thickened. There is no biliary duct dilatation.  Spleen is enlarged, measuring 16.3 x 14.1 x 9.3 cm. There are no focal splenic lesions.  Pancreas and adrenals appear normal. Kidneys bilaterally show no hydronephrosis on either side. There is no renal or ureteral calculus on either side. There is again noted a cyst arising from the anterior right kidney lower pole region measuring 2.3 x 2.1 cm. No new renal masses are  appreciable.  There is moderate generalized ascites.  In the pelvis, there is thickening of the wall of the urinary bladder. Prostate is prominent and inhomogeneous in attenuation. Fluid tracks through inguinal hernias bilaterally from the pelvis into each scrotal sac, more on the right than on the left. There is no pelvic mass. Appendix appears normal.  There are perirectal varices. There is some wall thickening in the distal sigmoid colon and rectum of uncertain etiology.  There is no appreciable bowel obstruction. There is no free air or portal venous air. There are periumbilical varices noted.  There is no adenopathy or abscess appreciable.  There is an area of apparent thrombus in the portal vein just beyond the confluence of the splenic and superior mesenteric veins.  There is atherosclerotic change in the aorta but no aneurysm. There is degenerative change in the lumbar spine. There are no blastic or lytic bone lesions.  IMPRESSION: Changes of hepatic cirrhosis. 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 There are 3 demonstrable subtle lesions which show enhancement within  the liver as noted above. While these areas could represent dysplastic change, early hepatoma must be of concern. The lesions in the right lobe of the liver are noted on recent MR ; the area of concern in the left lobe may be new. If alpha-fetoprotein has not been performed, it would be reasonable to perform this laboratory study. There is mild generalized inhomogeneity to the attenuation of the liver, probably due to dysplastic change associated with the cirrhosis.  There are periesophageal, perigastric, and periumbilical varices. There is splenomegaly.  There is moderate ascites. Note that ascites tracks through inguinal hernias in each scrotal sac, larger on the right than on the left.  There is enlargement of the prostate with inhomogeneity to the prostate attenuation.  Prostate impinges upon the base of the bladder but does not convincingly invade the bladder. There is some thickening of the urinary bladder wall. The thickening in the urinary bladder wall could be secondary to cystitis. With respect to the prostate, correlation with PSA is warranted given this appearance.  There is cholelithiasis.  There is focal wall thickening in the distal sigmoid colon and rectum. There are nearby perirectal varices. The wall thickening in the distal sigmoid colon and rectum could be secondary to muscular hypertrophy and/or as a result of internal hemorrhoids secondary to the variceal changes in these areas. This finding may warrant direct visualization to exclude the possibility of underlying neoplasm, however.   Electronically Signed   By: Lowella Grip M.D.   On: 11/10/2013 13:18   US Paracentesis  11/03/2013   CLINICAL DATA:  Hepatic masses, cirrhosis, hepatitis-C, ascites. Request is made for therapeutic paracentesis up to 5 liters.  EXAM: ULTRASOUND GUIDED THERAPEUTIC PARACENTESIS  COMPARISON:  None.  PROCEDURE: An ultrasound guided paracentesis was thoroughly discussed with the patient and questions answered. The benefits, risks, alternatives and complications were also discussed. The patient understands and wishes to proceed with the procedure. Written consent was obtained.  Ultrasound was performed to localize and mark an adequate pocket of fluid in the right lower quadrant of the abdomen. The area was then prepped and draped in the normal sterile fashion. 1% Lidocaine was used for local anesthesia. Under ultrasound guidance a 19 gauge Yueh catheter was introduced. Paracentesis was performed. The catheter was removed and a dressing applied.  Complications: None.  FINDINGS: A total of approximately 5 liters of yellow fluid was removed.  IMPRESSION: Successful ultrasound guided therapeutic paracentesis yielding 5 liters of ascites. The patient received IV albumin preprocedure.   Read by: Rowe Robert, PA-C   Electronically Signed   By: Arne Cleveland M.D.   On: 11/03/2013 12:49    Medications: Scheduled Meds: . docusate sodium  100 mg Oral BID  . enoxaparin (LOVENOX) injection  40 mg Subcutaneous Q24H  . finasteride  5 mg Oral Daily  . furosemide  20 mg Oral Daily  . hydrochlorothiazide  50 mg Oral Daily  . insulin aspart  0-9 Units Subcutaneous TID WC  . insulin glargine  14 Units Subcutaneous Daily  . lidocaine (PF)      . nadolol  20 mg Oral QHS  . spironolactone  50 mg Oral Daily  . tamsulosin  0.4 mg Oral Daily  . Vitamin D (Ergocalciferol)  50,000 Units Oral Q Fri      LOS: 1 day   Dushawn Pusey M.D. [facility] 11/11/2013, 1:27 PM Pager: [phone]  If 7PM-7AM, please contact night-coverage [email or web address] Password [email or web address]  **Disclaimer: This note was dictated with voice recognition software.  Similar sounding words can inadvertently be transcribed and this note may contain transcription errors which may not have been corrected upon publication of note.**

## 2013-11-11 NOTE — Progress Notes (Signed)
INITIAL NUTRITION ASSESSMENT  DOCUMENTATION CODES Per approved criteria  -Severe malnutrition in the context of chronic illness  Pt meets criteria for severe MALNUTRITION in the context of chronic illness as evidenced by 15% weight loss in <3 months and reported intake <75% of estimated needs for >1 month.  INTERVENTION: Glucerna Shake po TID, each supplement provides 220 kcal and 10 grams of protein  NUTRITION DIAGNOSIS: Inadequate oral intake related to abdominal pain as evidenced by weight loss.   Goal: Pt to meet >/= 90% of their estimated nutrition needs   Monitor:  Weight trends, acceptance of supplements, po intake, labs  Reason for Assessment: MST  63 y.o. male  Admitting Dx: Abdominal pain, epigastric  ASSESSMENT: 63 y.o. 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 Amedeo Plenty, chronic thrombocytopenia, ascites, hepatoma referred to [facility] for possible liver transplant, hypertension, diabetes mellitus, BPH, inguinal hernias who presents with abdominal pain.   - Pt reports a weight loss of 28 lbs within the past month. He says that he has not been eating much due to abdominal pain over the past month.  - Pt ate 100% of his lunch today, but developed some abdominal pain after eating. He says that he watches his carbohydrates at home because he has Diabetes.  - Pt agreed to try nutritional supplements.   Height: Ht Readings from Last 1 Encounters:  11/10/13 5\' 10"  (1.778 m)    Weight: Wt Readings from Last 1 Encounters:  11/10/13 157 lb (71.215 kg)    Ideal Body Weight: 73 kg  % Ideal Body Weight: 98%  Wt Readings from Last 10 Encounters:  11/10/13 157 lb (71.215 kg)  11/03/13 194 lb (87.998 kg)  10/06/13 189 lb (85.73 kg)  08/04/12 184 lb 9.6 oz (83.734 kg)  04/27/12 182 lb 3.2 oz (82.645 kg)  03/04/12 193 lb 9.6 oz (87.816 kg)  12/23/11 190 lb (86.183 kg)  12/20/11 195 lb (88.451 kg)   04/24/11 203 lb 6.4 oz (92.262 kg)  11/14/10 209 lb 6.4 oz (94.983 kg)    Usual Body Weight: 180-185 lbs  % Usual Body Weight: 85%  BMI:  Body mass index is 22.53 kg/(m^2).  Estimated Nutritional Needs: Kcal: 2100-2300 Protein: 100-110 g Fluid: 2.1-2.3 L/day  Skin: intact  Diet Order: Sodium Restricted  EDUCATION NEEDS: -Education needs addressed   Intake/Output Summary (Last 24 hours) at 11/11/13 1347 Last data filed at 11/11/13 1342  Gross per 24 hour  Intake    360 ml  Output    200 ml  Net    160 ml    Last BM: prior to admission   Labs:   Recent Labs Lab 11/10/13 0945 11/11/13 0451  NA 136* 137  K 4.2 3.9  CL 97 100  CO2 29 28  BUN 11 10  CREATININE 0.64 0.61  CALCIUM 9.0 9.0  GLUCOSE 208* 84    CBG (last 3)   Recent Labs  11/10/13 2120 11/11/13 0738 11/11/13 1305  GLUCAP 185* 97 108*    Scheduled Meds: . docusate sodium  100 mg Oral BID  . finasteride  5 mg Oral Daily  . furosemide  20 mg Oral Daily  . hydrochlorothiazide  50 mg Oral Daily  . insulin aspart  0-9 Units Subcutaneous TID WC  . insulin glargine  14 Units Subcutaneous Daily  . lidocaine (PF)      . nadolol  20 mg Oral QHS  . spironolactone  50  mg Oral Daily  . tamsulosin  0.4 mg Oral Daily  . Vitamin D (Ergocalciferol)  50,000 Units Oral Q Fri    Continuous Infusions:   Past Medical History  Diagnosis Date  . Macrocytosis   . Thrombocytopenia   . Esophageal bleed, non-variceal 2006  . HTN (hypertension)   . Psoriasis   . Mitral valve prolapse     OCCAS FLUTTER FEELING  . Vitamin D deficiency   . Umbilical hernia     OCCAS DISCOMFORT  . Prostate enlargement     PT TOLD "NORMAL FOR AGE" - TAKES FINASTERIDE AND FLOMAX  . Heart murmur   . Type II diabetes mellitus   . Rheumatic fever 1950's  . Hepatitis C 1980's  . Arthritis     "joint stiffness in the knees" (11/10/2013)  . Liver cancer     Past Surgical History  Procedure Laterality Date  .  Tonsilectomy, adenoidectomy, bilateral myringotomy and tubes  child  . Umbilical hernia repair  03/09/2012    Procedure: HERNIA REPAIR UMBILICAL ADULT;  Surgeon: Edward Jolly, MD;  Location: [facility];  Service: General;  Laterality: N/A;  Repair Umbilical Hernia with Mesh  . Insertion of mesh  03/09/2012    Procedure: INSERTION OF MESH;  Surgeon: Edward Jolly, MD;  Location: [facility];  Service: General;  Laterality: N/A;  . Inguinal hernia repair Left 1970's  . Hernia repair    . Tonsillectomy      Terrace Arabia RD, LDN

## 2013-11-12 DIAGNOSIS — R109 Unspecified abdominal pain: Secondary | ICD-10-CM

## 2013-11-12 DIAGNOSIS — M129 Arthropathy, unspecified: Secondary | ICD-10-CM

## 2013-11-12 DIAGNOSIS — R110 Nausea: Secondary | ICD-10-CM

## 2013-11-12 LAB — CBC
HCT: 38.2 % — ABNORMAL LOW (ref 39.0–52.0)
HEMOGLOBIN: 13.6 g/dL (ref 13.0–17.0)
MCH: 36 pg — ABNORMAL HIGH (ref 26.0–34.0)
MCHC: 35.6 g/dL (ref 30.0–36.0)
MCV: 101.1 fL — ABNORMAL HIGH (ref 78.0–100.0)
Platelets: 66 10*3/uL — ABNORMAL LOW (ref 150–400)
RBC: 3.78 MIL/uL — AB (ref 4.22–5.81)
RDW: 12.3 % (ref 11.5–15.5)
WBC: 3 10*3/uL — ABNORMAL LOW (ref 4.0–10.5)

## 2013-11-12 LAB — COMPREHENSIVE METABOLIC PANEL
ALK PHOS: 73 U/L (ref 39–117)
ALT: 13 U/L (ref 0–53)
AST: 24 U/L (ref 0–37)
Albumin: 2.8 g/dL — ABNORMAL LOW (ref 3.5–5.2)
Anion gap: 11 (ref 5–15)
BUN: 13 mg/dL (ref 6–23)
CALCIUM: 9.2 mg/dL (ref 8.4–10.5)
CO2: 28 mEq/L (ref 19–32)
Chloride: 98 mEq/L (ref 96–112)
Creatinine, Ser: 0.74 mg/dL (ref 0.50–1.35)
GFR calc non Af Amer: >90 mL/min (ref >90)
GLUCOSE: 149 mg/dL — AB (ref 70–99)
Potassium: 4.7 mEq/L (ref 3.7–5.3)
Sodium: 137 mEq/L (ref 137–147)
TOTAL PROTEIN: 6.3 g/dL (ref 6.0–8.3)
Total Bilirubin: 2 mg/dL — ABNORMAL HIGH (ref 0.3–1.2)

## 2013-11-12 LAB — GLUCOSE, CAPILLARY
GLUCOSE-CAPILLARY: 165 mg/dL — AB (ref 70–99)
GLUCOSE-CAPILLARY: 355 mg/dL — AB (ref 70–99)
GLUCOSE-CAPILLARY: 101 mg/dL — AB (ref 70–99)
Glucose-Capillary: 151 mg/dL — ABNORMAL HIGH (ref 70–99)
Glucose-Capillary: 191 mg/dL — ABNORMAL HIGH (ref 70–99)

## 2013-11-12 MED ORDER — SODIUM CHLORIDE 0.9 % IV BOLUS (SEPSIS)
250.0000 mL | Freq: Once | INTRAVENOUS | Status: AC
  Administered 2013-11-12: 250 mL via INTRAVENOUS

## 2013-11-12 NOTE — Consult Note (Signed)
Reason for Consult:  Inguinal hernia and abdominal pain, much improved with paracentesis  Referring Physician: Dr. Herbie Baltimore Buccini   Ethan Wade is an 63 y.o. male.  HPI: 63 y/o male with Hepatitis C, Hepatocellular liver cancer, alcoholic cirrhosis, esophageal and rectal varices.  He has been referred to [facility] for liver transplant and is now scheduled for some type of radiologic seeding to treat his cancer.  He was admitted on 11/10/13 by medicine about 1 week after his first paracentesis.  He had 5 liters removed and felt much better.  Post procedure he developed abdominal pain radiating to his back;  worse with movement.  He was seen in the ED and was mildly hypotensive and bradycardic.  He has been evaluated for possible portal vein thrombus and this has been found to be negative. Admit Ct shows cirrhosis, possible thrombus that has been ruled out with Doppler studies, 3 liver liver lesions that based on AFP studies are being treated as a cancer. Esophageal, perigastric periumbilical and perirectal varices. The changes in the distal sigmoid colon and rectum raised concerns of an underlying neoplasm. He has had a second paracentesis on 11/11/13, 3 liters. Dopplers show the main and right portal veins are patent, the left portal vein patent on CT scan. He had fluid tracts thruough both inguinal hernia tracts, more on the right than the left.  No appreciable bowel obstruction, free air, or portal venous air. He is improving but Dr. Cristina Gong wanted to have an evaluation of his hernia's to add to the possible treatments of his abdominal discomfort. He did have an umbilical hernia repaired 02/2012, by Dr. Excell Seltzer.  Past Medical History  Diagnosis Date  . Macrocytosis   . Thrombocytopenia   . Esophageal bleed, non-variceal 2006  . HTN (hypertension)   . Psoriasis   . Mitral valve prolapse     OCCAS FLUTTER FEELING  . Vitamin D deficiency   . Umbilical hernia     OCCAS DISCOMFORT  . Prostate enlargement      PT TOLD "NORMAL FOR AGE" - TAKES FINASTERIDE AND FLOMAX  . Heart murmur   . Type II diabetes mellitus   . Rheumatic fever 1950's  . Hepatitis C 1980's  . Arthritis     "joint stiffness in the knees" (11/10/2013)  . Liver cancer     Past Surgical History  Procedure Laterality Date  . Tonsilectomy, adenoidectomy, bilateral myringotomy and tubes  child  . Umbilical hernia repair  03/09/2012    Procedure: HERNIA REPAIR UMBILICAL ADULT;  Surgeon: Edward Jolly, MD;  Location: [facility];  Service: General;  Laterality: N/A;  Repair Umbilical Hernia with Mesh  . Insertion of mesh  03/09/2012    Procedure: INSERTION OF MESH;  Surgeon: Edward Jolly, MD;  Location: [facility];  Service: General;  Laterality: N/A;  . Inguinal hernia repair Left 1970's  . Hernia repair    . Tonsillectomy      Family History  Problem Relation Age of Onset  . Prostate cancer Brother   . COPD Mother   . Heart disease Father   . Diabetes Father   . Alcohol abuse Father   . Liver cancer Brother   . Colon cancer Brother   . COPD Maternal Grandfather     Social History:  reports that he has quit smoking. His smoking use included Cigars. He started smoking about 7 years ago. He has quit using smokeless tobacco. His smokeless tobacco use included Snuff. He reports that he drinks  alcohol. He reports that he uses illicit drugs.  Allergies: No Known Allergies  Medications:  Prior to Admission:  Prescriptions prior to admission  Medication Sig Dispense Refill  . Carboxymethylcellul-Glycerin (CLEAR EYES FOR DRY EYES) 1-0.25 % SOLN Apply 1-2 drops to eye daily as needed. For dry eyes      . finasteride (PROSCAR) 5 MG tablet Take 5 mg by mouth daily.      . furosemide (LASIX) 20 MG tablet Take 20 mg by mouth.      Marland Kitchen ibuprofen (ADVIL,MOTRIN) 600 MG tablet Take 600 mg by mouth every 6 (six) hours as needed. For pain      . insulin glargine (LANTUS) 100 UNIT/ML injection Inject 14 Units into the skin daily.       . nadolol (CORGARD) 20 MG tablet Take 20 mg by mouth daily.      Marland Kitchen spironolactone-hydrochlorothiazide (ALDACTAZIDE) 50-50 MG per tablet Take 1 tablet by mouth daily.      . Tamsulosin HCl (FLOMAX) 0.4 MG CAPS Take 0.4 mg by mouth daily.      . Vitamin D, Ergocalciferol, (DRISDOL) 50000 UNITS CAPS Take 50,000 Units by mouth every Friday.      . zolpidem (AMBIEN) 10 MG tablet Take 10 mg by mouth at bedtime as needed. For sleep      . nadolol (CORGARD) 20 MG tablet Take 20 mg by mouth at bedtime.       Scheduled: . docusate sodium  100 mg Oral BID  . feeding supplement (GLUCERNA SHAKE)  237 mL Oral TID BM  . finasteride  5 mg Oral Daily  . furosemide  20 mg Oral Daily  . hydrochlorothiazide  50 mg Oral Daily  . insulin aspart  0-9 Units Subcutaneous TID WC  . insulin glargine  14 Units Subcutaneous Daily  . nadolol  20 mg Oral QHS  . spironolactone  50 mg Oral Daily  . tamsulosin  0.4 mg Oral Daily  . Vitamin D (Ergocalciferol)  50,000 Units Oral Q Fri   Continuous:  BJS:EGBTDVVOHYW (ZOFRAN) IV, ondansetron, oxyCODONE, polyethylene glycol, polyvinyl alcohol, zolpidem Anti-infectives   None      Results for orders placed during the hospital encounter of 11/10/13 (from the past 48 hour(s))  GLUCOSE, CAPILLARY     Status: Abnormal   Collection Time    11/10/13  5:34 PM      Result Value Ref Range   Glucose-Capillary 124 (*) 70 - 99 mg/dL  PROTIME-INR     Status: Abnormal   Collection Time    11/10/13  6:00 PM      Result Value Ref Range   Prothrombin Time 16.8 (*) 11.6 - 15.2 seconds   INR 1.36  0.00 - 1.49  APTT     Status: None   Collection Time    11/10/13  6:00 PM      Result Value Ref Range   aPTT 36  24 - 37 seconds  GLUCOSE, CAPILLARY     Status: Abnormal   Collection Time    11/10/13  9:20 PM      Result Value Ref Range   Glucose-Capillary 185 (*) 70 - 99 mg/dL  AFP TUMOR MARKER     Status: Abnormal   Collection Time    11/11/13  4:51 AM      Result Value  Ref Range   AFP-Tumor Marker 97.0 (*) 0.0 - 8.0 ng/mL   Comment: (NOTE)     The Advia Centaur AFP immunoassay method is used.  Results obtained     with different assay methods or kits cannot be used interchangeably.     AFP is a valuable aid in the management of nonseminomatous testicular     cancer patients when used in conjunction with information available     from the clinical evaluation and other diagnostic procedures.     Increased serum AFP concentrations have also been observed in ataxia     telangiectasia, hereditary tyrosinemia, primary hepatocellular     carcinoma, teratocarcinoma, gastrointestinal tract cancers with and     without liver metastases, and in benign hepatic conditions such as     acute viral hepatitis, chronic active hepatitis, and cirrhosis.  This     result cannot be interpreted as absolute evidence of the presence or     absence of malignant disease.  This result is not interpretable in     pregnant females.     Performed at [facility]     Status: Abnormal   Collection Time    11/11/13  4:51 AM      Result Value Ref Range   Hemoglobin A1C 6.7 (*) <5.7 %   Comment: (NOTE)                                                                               According to the ADA Clinical Practice Recommendations for 2011, when     HbA1c is used as a screening test:      >=6.5%   Diagnostic of Diabetes Mellitus               (if abnormal result is confirmed)     5.7-6.4%   Increased risk of developing Diabetes Mellitus     References:Diagnosis and Classification of Diabetes Mellitus,Diabetes     IOXB,3532,99(MEQAS 1):S62-S69 and Standards of Medical Care in             Diabetes - 2011,Diabetes TMHD,6222,97 (Suppl 1):S11-S61.   Mean Plasma Glucose 146 (*) <117 mg/dL   Comment: Performed at [place]     Status: Abnormal   Collection Time    11/11/13  4:51 AM      Result Value Ref Range   Sodium  137  137 - 147 mEq/L   Potassium 3.9  3.7 - 5.3 mEq/L   Chloride 100  96 - 112 mEq/L   CO2 28  19 - 32 mEq/L   Glucose, Bld 84  70 - 99 mg/dL   BUN 10  6 - 23 mg/dL   Creatinine, Ser 0.61  0.50 - 1.35 mg/dL   Calcium 9.0  8.4 - 10.5 mg/dL   Total Protein 6.2  6.0 - 8.3 g/dL   Albumin 2.8 (*) 3.5 - 5.2 g/dL   AST 25  0 - 37 U/L   ALT 13  0 - 53 U/L   Alkaline Phosphatase 71  39 - 117 U/L   Total Bilirubin 2.4 (*) 0.3 - 1.2 mg/dL   GFR calc non Af Amer >90  >90 mL/min   GFR calc Af Amer >90  >90 mL/min   Comment: (NOTE)     The eGFR has been calculated  using the CKD EPI equation.     This calculation has not been validated in all clinical situations.     eGFR's persistently <90 mL/min signify possible Chronic Kidney     Disease.   Anion gap 9  5 - 15  CBC     Status: Abnormal   Collection Time    11/11/13  4:51 AM      Result Value Ref Range   WBC 3.3 (*) 4.0 - 10.5 K/uL   RBC 3.69 (*) 4.22 - 5.81 MIL/uL   Hemoglobin 13.4  13.0 - 17.0 g/dL   HCT 36.8 (*) 39.0 - 52.0 %   MCV 99.7  78.0 - 100.0 fL   MCH 36.3 (*) 26.0 - 34.0 pg   MCHC 36.4 (*) 30.0 - 36.0 g/dL   RDW 12.4  11.5 - 15.5 %   Platelets 77 (*) 150 - 400 K/uL   Comment: CONSISTENT WITH PREVIOUS RESULT  GLUCOSE, CAPILLARY     Status: None   Collection Time    11/11/13  7:38 AM      Result Value Ref Range   Glucose-Capillary 97  70 - 99 mg/dL  BODY FLUID CELL COUNT WITH DIFFERENTIAL     Status: Abnormal   Collection Time    11/11/13 11:57 AM      Result Value Ref Range   Fluid Type-FCT PERITONEAL     Comment: FLUID     CORRECTED ON 08/15 AT 1304: PREVIOUSLY REPORTED AS Body Fluid   Color, Fluid YELLOW     Appearance, Fluid CLOUDY (*) CLEAR   WBC, Fluid 323  0 - 1000 cu mm   Neutrophil Count, Fluid 3  0 - 25 %   Lymphs, Fluid 72     Monocyte-Macrophage-Serous Fluid 25 (*) 50 - 90 %  BODY FLUID CULTURE     Status: None   Collection Time    11/11/13 11:57 AM      Result Value Ref Range   Specimen Description  PERITONEAL FLUID     Special Requests NONE     Gram Stain       Value: RARE WBC PRESENT, PREDOMINANTLY MONONUCLEAR     NO ORGANISMS SEEN     Performed at [facility]   Culture       Value: NO GROWTH 1 DAY     Performed at [facility]   Report Status PENDING    GLUCOSE, CAPILLARY     Status: Abnormal   Collection Time    11/11/13  1:05 PM      Result Value Ref Range   Glucose-Capillary 108 (*) 70 - 99 mg/dL  GLUCOSE, CAPILLARY     Status: Abnormal   Collection Time    11/11/13  4:22 PM      Result Value Ref Range   Glucose-Capillary 263 (*) 70 - 99 mg/dL  GLUCOSE, CAPILLARY     Status: Abnormal   Collection Time    11/11/13  9:21 PM      Result Value Ref Range   Glucose-Capillary 165 (*) 70 - 99 mg/dL   Comment 1 Notify RN    COMPREHENSIVE METABOLIC PANEL     Status: Abnormal   Collection Time    11/12/13  8:00 AM      Result Value Ref Range   Sodium 137  137 - 147 mEq/L   Potassium 4.7  3.7 - 5.3 mEq/L   Chloride 98  96 - 112 mEq/L   CO2 28  19 - 32 mEq/L   Glucose, Bld 149 (*) 70 - 99 mg/dL   BUN 13  6 - 23 mg/dL   Creatinine, Ser 0.74  0.50 - 1.35 mg/dL   Calcium 9.2  8.4 - 10.5 mg/dL   Total Protein 6.3  6.0 - 8.3 g/dL   Albumin 2.8 (*) 3.5 - 5.2 g/dL   AST 24  0 - 37 U/L   ALT 13  0 - 53 U/L   Alkaline Phosphatase 73  39 - 117 U/L   Total Bilirubin 2.0 (*) 0.3 - 1.2 mg/dL   GFR calc non Af Amer >90  >90 mL/min   GFR calc Af Amer >90  >90 mL/min   Comment: (NOTE)     The eGFR has been calculated using the CKD EPI equation.     This calculation has not been validated in all clinical situations.     eGFR's persistently <90 mL/min signify possible Chronic Kidney     Disease.   Anion gap 11  5 - 15  CBC     Status: Abnormal   Collection Time    11/12/13  8:00 AM      Result Value Ref Range   WBC 3.0 (*) 4.0 - 10.5 K/uL   RBC 3.78 (*) 4.22 - 5.81 MIL/uL   Hemoglobin 13.6  13.0 - 17.0 g/dL   HCT 38.2 (*) 39.0 - 52.0 %   MCV 101.1 (*) 78.0 -  100.0 fL   MCH 36.0 (*) 26.0 - 34.0 pg   MCHC 35.6  30.0 - 36.0 g/dL   RDW 12.3  11.5 - 15.5 %   Platelets 66 (*) 150 - 400 K/uL   Comment: CONSISTENT WITH PREVIOUS RESULT  GLUCOSE, CAPILLARY     Status: Abnormal   Collection Time    11/12/13  8:04 AM      Result Value Ref Range   Glucose-Capillary 151 (*) 70 - 99 mg/dL  GLUCOSE, CAPILLARY     Status: Abnormal   Collection Time    11/12/13 11:45 AM      Result Value Ref Range   Glucose-Capillary 355 (*) 70 - 99 mg/dL    [place] Art/ven Flow Abd Pelv Doppler  11/12/2013   CLINICAL DATA:  Evaluate portal vein. Abnormal CT. Main portal vein partial thrombus is suggested.  EXAM: DUPLEX ULTRASOUND OF LIVER  TECHNIQUE: Color and duplex Doppler ultrasound was performed to evaluate the hepatic in-flow and out-flow vessels.  COMPARISON:  CT yesterday  FINDINGS: Portal Vein Velocities  Main:  34 cm/sec  Right:  19 cm/sec  Left:  Obscured cm/sec  Hepatic Vein Velocities  Right:  21 cm/sec  Middle:  18 cm/sec  Left:  37 cm/sec  Hepatic Artery Velocity:  100 cm/sec  Splenic Vein Velocity:  20 cm/sec  Varices: Absent  Ascites: Present  The left hepatic lobe and left portal vein were obscured by overlying bowel gas and could not be evaluated. The right and main portal veins are patent with hepatopetal flow. Hepatic veins are patent with hepatofugal flow. Splenic vein is also patent.  IMPRESSION: Main and right portal veins are patent with hepatopetal flow. Left portal vein was obscured. On the accompanying CT, the left portal vein is patent.   Electronically Signed   By: Maryclare Bean M.D.   On: 11/12/2013 09:14   US Paracentesis  11/11/2013   CLINICAL DATA:  Abdominal discomfort. Concern for peritonitis. Request diagnostic paracentesis.  EXAM: ULTRASOUND GUIDED PARACENTESIS  COMPARISON:  Previous paracentesis  PROCEDURE: An ultrasound guided paracentesis was thoroughly discussed with the patient and questions answered. The benefits, risks, alternatives and  complications were also discussed. The patient understands and wishes to proceed with the procedure. Written consent was obtained.  Ultrasound finds small volume of residual ascites in the perihepatic and lower pelvic regions.  A small pocket of fluid in the right suprapubic region was localized for attempt at paracentesis. The area was then prepped and draped in the normal sterile fashion. 1% Lidocaine was used for local anesthesia. Under ultrasound guidance a 19 gauge Yueh catheter was introduced but only about 3 mLs were able to be aspirated. The catheter was removed and a dressing applied.  COMPLICATIONS: None immediate  FINDINGS: A total of approximately . 3 mL. of clear yellow fluid was removed. A fluid sample was sent for laboratory analysis.  IMPRESSION: Successful ultrasound guided paracentesis yielding 3 mL of ascites.   Electronically Signed   By: Maryclare Bean M.D.   On: 11/11/2013 13:25    Review of Systems  Constitutional: Positive for weight loss and malaise/fatigue. Negative for fever, chills and diaphoresis.  HENT: Negative.   Eyes: Negative.   Respiratory: Negative.   Cardiovascular: Positive for leg swelling.  Gastrointestinal: Positive for nausea and abdominal pain (going down around his testicles). Negative for diarrhea, constipation, blood in stool and melena.  Genitourinary: Negative.        He has allot of pain when the fluid build up is significant.  Musculoskeletal: Negative.   Skin: Positive for rash.       He has some chronic eczema.   Neurological: Negative.   Endo/Heme/Allergies: Negative.   Psychiatric/Behavioral: Negative.    Blood pressure 80/50, pulse 64, temperature 98 F (36.7 C), temperature source Oral, resp. rate 14, height 5' 10"  (1.778 m), weight 71.986 kg (158 lb 11.2 oz), SpO2 98.00%. Physical Exam  Constitutional: He is oriented to person, place, and time. He appears well-developed.  Thin male in no distress, walking in room and halls here on the floor.   HENT:  Head: Normocephalic and atraumatic.  Nose: Nose normal.  Eyes: Conjunctivae and EOM are normal. Pupils are equal, round, and reactive to light. Right eye exhibits no discharge. Left eye exhibits no discharge.  Neck: Normal range of motion. Neck supple. No JVD present. No tracheal deviation present. No thyromegaly present.  Cardiovascular: Normal rate and regular rhythm.  Exam reveals no gallop.   Murmur (systolic mumur) heard. Respiratory: Effort normal and breath sounds normal. No respiratory distress. He has no wheezes. He has no rales. He exhibits no tenderness.  GI: Bowel sounds are normal. He exhibits no distension and no mass. There is tenderness (some tenderness lower abdomen, going to testicles, much improved since paracentesis). There is no rebound and no guarding.  Small umbilical hernia   Genitourinary: Penis normal.  No significant fluid currently going to scrotum.  No bowel noted inguinal tracts.  Musculoskeletal: He exhibits no edema and no tenderness.  Lymphadenopathy:    He has no cervical adenopathy.  Neurological: He is alert and oriented to person, place, and time. No cranial nerve deficit.  Skin: Skin is warm and dry. Rash noted.  Psychiatric: He has a normal mood and affect. His behavior is normal. Judgment and thought content normal.    Assessment/Plan: 1.  Abdominal and lower perineal pain going to scrotum 2.  Hepatitis C 3.  Hepatocellular cancer ( he starts treatment at [facility] 11/21/13, Dr. Nelva Bush) 4.  Hx of alcoholic cirrhosis 5.  S/p repair of umbilical hernia and inguinal hernias 6.  Esophageal, rectal, perigastric, and perirectal varices, with prior history of banding. 7.  MVP 8.  Type II AODM 9.  Hypertension 10.  Hx of rheumatic fever 11. Psoriasis 12.  Hx of tobacco use Plan:  Pt is feeling better and walking up here with minimal discomfort.  He feels better after paracentesis.  He is still afraid it's going to hurt.  Unfortunately he is not a  candidate for elective surgery.  If he developed and incarcerated hernia we would operate on him then, but for now he does not show any signs of obstruction.  He reports he's eating whatever they bring, and is stable.  Dr. Rosendo Gros will see and he can follow up with Dr. Excell Seltzer, but right now we would not do elective surgery on him.  Pankaj Haack 11/12/2013, 5:27 PM

## 2013-11-12 NOTE — Consult Note (Signed)
I have seen and examined the pt and agree with PA-Jenning's note. Reducible [facility] and [facility]. Pt con't with ascities.  I do not think his umbilical pain is die to his inguinal hernias, and most likely due to ascities, and abd wall varicies.  I did discuss the case with Dr. Excell Seltzer and he agrees that this is not electively operatable at this time. Pt can f/u with Dr. Excell Seltzer as needed.

## 2013-11-12 NOTE — Progress Notes (Signed)
Informed by NT that pt bp 76/52. Pt assessed to be asymptomatic, however, reported 1 instance of feeling dizzy when rising this afternoon. BP assessed manually on L arm 80/54 and R arm 80/50. Dr. Tana Coast notified. Orders received to give 250 NS bolus, and hold nadolol tonight. NS bolus infusing at present. Will continue to monitor. Manya Silvas, RN

## 2013-11-12 NOTE — Progress Notes (Signed)
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 SBP  - Diagnostic paracentesis done today, followup studies does not show SBP - GI consulted, recommended to hold off anticoagulation for the time being,will need to confer with IR and with patient's primary gastroenterologist (Dr. Amedeo Plenty) on Monday, in light of the Doppler findings, to decide on anticoagulation. - Ultrasound Doppler showed Main and right portal veins are patent with hepatopetal flow. Left portal vein was obscured. Follow GI recommendations.   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 Banded about 7 years ago, last EGD/colo in 2011. Never been treated for HCV..  - Contiue lasix, spironolactone, HCTZ  - Continue nadolol   Hx of hepatocellular carcinoma referred to [facility] for possible liver transplant but declined due to location of lesions. Possible new lesion in left lobe.  - Planning for future embolization  - AFP elevated 97.0  Possible rectal mass vs. Varices  - Will follow GI recommendations  Hypertension - Currently borderline, continue following closely, on nadolol   Diabetes mellitus type 2 without complication  - Followup A1c , continue lantus and sliding scale insulin  BPH, enlarged prostate on CT, continue finasteride and flomax  - PSA ordered   Inguinal hernias and previously repaired umbilical hernia, followed by Dr. Excell Seltzer. Surgery for more recent hernias deferred due to other medical problems.   Leukopenia, followed by oncology. Chronic.   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   DVT Prophylaxis: SCDs  Code Status: Full code  Family  Communication:  Disposition:  Consultants:  Gastroenterology  Procedures:  Paracentesis  Antibiotics:  None  Subjective: States abdominal pain has significantly improved, ambulating in the hallway  Objective: Weight change: -11.93 kg (-26 lb 4.8 oz)  Intake/Output Summary (Last 24 hours) at 11/12/13 1032 Last data filed at 11/12/13 0900  Gross per 24 hour  Intake   1080 ml  Output    595 ml  Net    485 ml   Blood pressure 105/63, pulse 66, temperature 98 F (36.7 C), temperature source Oral, resp. rate 19, height 5\' 10"  (1.778 m), weight 71.986 kg (158 lb 11.2 oz), SpO2 99.00%.  Physical Exam: General: Alert and awake, oriented x3, not in any acute distress. CVS: S1-S2 clear, no murmur rubs or gallops  Chest: clear to auscultation bilaterally, no wheezing, rales or rhonchi Abdomen: soft no tenderness, ventral hernia Extremities: no cyanosis, clubbing, silver scaling plaques on the legs   Lab Results: Basic Metabolic Panel:  Recent Labs Lab 11/10/13 0945 11/11/13 0451  NA 136* 137  K 4.2 3.9  CL 97 100  CO2 29 28  GLUCOSE 208* 84  BUN 11 10  CREATININE 0.64 0.61  CALCIUM 9.0 9.0   Liver Function Tests:  Recent Labs Lab 11/10/13 0945 11/11/13 0451  AST 24 25  ALT 14 13  ALKPHOS 84 71  BILITOT 2.1* 2.4*  PROT 6.5 6.2  ALBUMIN 2.9* 2.8*    Recent Labs Lab 11/10/13 0945  LIPASE 29   No results found for this basename: AMMONIA,  in the last 168 hours CBC:  Recent Labs Lab 11/10/13 0945 11/11/13 0451 11/12/13 0800  WBC 3.6* 3.3* 3.0*  NEUTROABS 2.1  --   --  HGB 13.7 13.4 13.6  HCT 38.0* 36.8* 38.2*  MCV 99.2 99.7 101.1*  PLT 78* 77* 66*   Cardiac Enzymes: No results found for this basename: CKTOTAL, CKMB, CKMBINDEX, TROPONINI,  in the last 168 hours BNP: No components found with this basename: POCBNP,  CBG:  Recent Labs Lab 11/11/13 0738 11/11/13 1305 11/11/13 1622 11/11/13 2121 11/12/13 0804  GLUCAP 97 108* 263* 165*  151*     Micro Results: Recent Results (from the past 240 hour(s))  BODY FLUID CULTURE     Status: None   Collection Time    11/11/13 11:57 AM      Result Value Ref Range Status   Specimen Description PERITONEAL FLUID   Final   Special Requests NONE   Final   Gram Stain     Final   Value: RARE WBC PRESENT, PREDOMINANTLY MONONUCLEAR     NO ORGANISMS SEEN     Performed at [facility]   Culture PENDING   Incomplete   Report Status PENDING   Incomplete    Studies/Results: Ct Abdomen Pelvis W Contrast  11/10/2013   CLINICAL DATA:  Abdominal pain  EXAM: CT ABDOMEN AND PELVIS WITH CONTRAST  TECHNIQUE: Multidetector CT imaging of the abdomen and pelvis was performed using the standard protocol following bolus administration of intravenous contrast. Oral contrast was also administered.  CONTRAST:  71mL OMNIPAQUE IOHEXOL 300 MG/ML  SOLN  COMPARISON:  February 03, 2013 CT abdomen and pelvis and MR abdomen September 15, 2013  FINDINGS: There is no lung base edema or consolidation. There are multiple varices noted in the periesophageal regions as well as in the perigastric regions between the liver and stomach in the upper abdomen. This appearance is stable.  The liver has a nodular irregular contour with enlargement of the caudate lobe and relative hypoplasia of the remainder of the liver consistent with cirrhosis. There is again noted subtle enhancement of an area in the dome of the liver on the right, measuring 2.4 by 1.9 cm, stable. There is a second subtle area of enhancement in the medial segment of the left lobe of the liver measuring 3.1 x 2.1 cm, not seen on the prior CT and not convincingly seen on recent prior MR. There is a third area of enhancement along the posterior segment of the right lobe of the liver measuring 2.9 x 1.9 cm, slightly better seen at this time compared to the prior CT and present on recent MR. The overall attenuation of the liver is somewhat inhomogeneous consistent with  the underlying cirrhosis.  There is a gallstone within the gallbladder. The gallbladder wall is not thickened. There is no biliary duct dilatation.  Spleen is enlarged, measuring 16.3 x 14.1 x 9.3 cm. There are no focal splenic lesions.  Pancreas and adrenals appear normal. Kidneys bilaterally show no hydronephrosis on either side. There is no renal or ureteral calculus on either side. There is again noted a cyst arising from the anterior right kidney lower pole region measuring 2.3 x 2.1 cm. No new renal masses are appreciable.  There is moderate generalized ascites.  In the pelvis, there is thickening of the wall of the urinary bladder. Prostate is prominent and inhomogeneous in attenuation. Fluid tracks through inguinal hernias bilaterally from the pelvis into each scrotal sac, more on the right than on the left. There is no pelvic mass. Appendix appears normal.  There are perirectal varices. There is some wall thickening in the distal sigmoid colon and rectum of  uncertain etiology.  There is no appreciable bowel obstruction. There is no free air or portal venous air. There are periumbilical varices noted.  There is no adenopathy or abscess appreciable.  There is an area of apparent thrombus in the portal vein just beyond the confluence of the splenic and superior mesenteric veins.  There is atherosclerotic change in the aorta but no aneurysm. There is degenerative change in the lumbar spine. There are no blastic or lytic bone lesions.  IMPRESSION: Changes of hepatic cirrhosis. 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 There are 3 demonstrable subtle lesions which show enhancement within the liver as noted above. While these areas could represent dysplastic change, early hepatoma must be of concern. The lesions in the right lobe of the liver are noted on recent MR ; the area of concern in the left lobe may be new. If alpha-fetoprotein has not  been performed, it would be reasonable to perform this laboratory study. There is mild generalized inhomogeneity to the attenuation of the liver, probably due to dysplastic change associated with the cirrhosis.  There are periesophageal, perigastric, and periumbilical varices. There is splenomegaly.  There is moderate ascites. Note that ascites tracks through inguinal hernias in each scrotal sac, larger on the right than on the left.  There is enlargement of the prostate with inhomogeneity to the prostate attenuation. Prostate impinges upon the base of the bladder but does not convincingly invade the bladder. There is some thickening of the urinary bladder wall. The thickening in the urinary bladder wall could be secondary to cystitis. With respect to the prostate, correlation with PSA is warranted given this appearance.  There is cholelithiasis.  There is focal wall thickening in the distal sigmoid colon and rectum. There are nearby perirectal varices. The wall thickening in the distal sigmoid colon and rectum could be secondary to muscular hypertrophy and/or as a result of internal hemorrhoids secondary to the variceal changes in these areas. This finding may warrant direct visualization to exclude the possibility of underlying neoplasm, however.   Electronically Signed   By: Lowella Grip M.D.   On: 11/10/2013 13:18   US Paracentesis  11/03/2013   CLINICAL DATA:  Hepatic masses, cirrhosis, hepatitis-C, ascites. Request is made for therapeutic paracentesis up to 5 liters.  EXAM: ULTRASOUND GUIDED THERAPEUTIC PARACENTESIS  COMPARISON:  None.  PROCEDURE: An ultrasound guided paracentesis was thoroughly discussed with the patient and questions answered. The benefits, risks, alternatives and complications were also discussed. The patient understands and wishes to proceed with the procedure. Written consent was obtained.  Ultrasound was performed to localize and mark an adequate pocket of fluid in the right lower  quadrant of the abdomen. The area was then prepped and draped in the normal sterile fashion. 1% Lidocaine was used for local anesthesia. Under ultrasound guidance a 19 gauge Yueh catheter was introduced. Paracentesis was performed. The catheter was removed and a dressing applied.  Complications: None.  FINDINGS: A total of approximately 5 liters of yellow fluid was removed.  IMPRESSION: Successful ultrasound guided therapeutic paracentesis yielding 5 liters of ascites. The patient received IV albumin preprocedure.  Read by: Rowe Robert, PA-C   Electronically Signed   By: Arne Cleveland M.D.   On: 11/03/2013 12:49    Medications: Scheduled Meds: . docusate sodium  100 mg Oral BID  . feeding supplement (GLUCERNA SHAKE)  237 mL Oral TID BM  . finasteride  5 mg Oral  Daily  . furosemide  20 mg Oral Daily  . hydrochlorothiazide  50 mg Oral Daily  . insulin aspart  0-9 Units Subcutaneous TID WC  . insulin glargine  14 Units Subcutaneous Daily  . nadolol  20 mg Oral QHS  . spironolactone  50 mg Oral Daily  . tamsulosin  0.4 mg Oral Daily  . Vitamin D (Ergocalciferol)  50,000 Units Oral Q Fri      LOS: 2 days   RAI,RIPUDEEP M.D. [facility] 11/12/2013, 10:32 AM Pager: [phone]  If 7PM-7AM, please contact night-coverage [email or web address] Password [email or web address]  **Disclaimer: This note was dictated with voice recognition software. Similar sounding words can inadvertently be transcribed and this note may contain transcription errors which may not have been corrected upon publication of note.**

## 2013-11-12 NOTE — Progress Notes (Signed)
The patient's hepatic arterial Doppler ultrasound, which I discussed with the radiologist who read the study, clearly shows the absence of any portal vein thrombosis.   Therefore, the patient does not need anticoagulation.  Meanwhile, he was able to get up and walk around somewhat yesterday evening, without significant discomfort. He had slight discomfort while walking today.  His inguinal hernias, which are bilateral, are moderate in size, and not easily reducible by me. They are not significantly tender. Their relationship to his nonspecific low abdominal pain is not clear, but it is clear that his pain is positional in character. It could possibly be somehow related to his ascites shifting when he stands up (although I have not seen at another patients), some sort of atypical lumbar radiculopathy, or referred pain from his hernia.  Plan:  1. Surgical consultation, now that his ascites is diminished compared to when Dr. Excell Seltzer saw him in the office, to get an opinion as to whether his pain could be arising from the hernia, and/or whether there is any nonsurgical management for this problem (the patient is not likely a surgical candidate in view of his severe liver disease).  2. No need for anticoagulation, as discussed above  3. From the GI tract standpoint, the patient would be okay for discharge whenever his abdominal pain resolves or is adequately controlled.  Cleotis Nipper, M.D. [phone]

## 2013-11-13 LAB — CBC
HCT: 37.4 % — ABNORMAL LOW (ref 39.0–52.0)
Hemoglobin: 13.1 g/dL (ref 13.0–17.0)
MCH: 34.9 pg — ABNORMAL HIGH (ref 26.0–34.0)
MCHC: 35 g/dL (ref 30.0–36.0)
MCV: 99.7 fL (ref 78.0–100.0)
PLATELETS: 66 10*3/uL — AB (ref 150–400)
RBC: 3.75 MIL/uL — ABNORMAL LOW (ref 4.22–5.81)
RDW: 12.2 % (ref 11.5–15.5)
WBC: 4.1 10*3/uL (ref 4.0–10.5)

## 2013-11-13 LAB — COMPREHENSIVE METABOLIC PANEL
ALT: 11 U/L (ref 0–53)
ANION GAP: 7 (ref 5–15)
AST: 25 U/L (ref 0–37)
Albumin: 2.8 g/dL — ABNORMAL LOW (ref 3.5–5.2)
Alkaline Phosphatase: 69 U/L (ref 39–117)
BUN: 13 mg/dL (ref 6–23)
CALCIUM: 9.1 mg/dL (ref 8.4–10.5)
CO2: 30 meq/L (ref 19–32)
Chloride: 97 mEq/L (ref 96–112)
Creatinine, Ser: 0.8 mg/dL (ref 0.50–1.35)
Glucose, Bld: 110 mg/dL — ABNORMAL HIGH (ref 70–99)
Potassium: 3.9 mEq/L (ref 3.7–5.3)
Sodium: 134 mEq/L — ABNORMAL LOW (ref 137–147)
Total Bilirubin: 2 mg/dL — ABNORMAL HIGH (ref 0.3–1.2)
Total Protein: 6.2 g/dL (ref 6.0–8.3)

## 2013-11-13 LAB — GLUCOSE, CAPILLARY
GLUCOSE-CAPILLARY: 208 mg/dL — AB (ref 70–99)
Glucose-Capillary: 189 mg/dL — ABNORMAL HIGH (ref 70–99)

## 2013-11-13 LAB — PATHOLOGIST SMEAR REVIEW: Path Review: REACTIVE

## 2013-11-13 LAB — PSA: PSA: 0.12 ng/mL (ref <=4.00)

## 2013-11-13 MED ORDER — OXYCODONE HCL 5 MG PO TABS: 5.0000 mg | ORAL_TABLET | Freq: Four times a day (QID) | ORAL | Status: DC | PRN

## 2013-11-13 MED ORDER — OXYCODONE HCL 5 MG PO TABS: 5.0000 mg | ORAL_TABLET | Freq: Two times a day (BID) | ORAL | Status: DC | PRN

## 2013-11-13 MED ORDER — ONDANSETRON 4 MG PO TBDP: 4.0000 mg | ORAL_TABLET | Freq: Three times a day (TID) | ORAL | Status: DC | PRN

## 2013-11-13 MED ORDER — POLYETHYLENE GLYCOL 3350 17 G PO PACK: 17.0000 g | PACK | Freq: Every day | ORAL | Status: DC | PRN

## 2013-11-13 MED ORDER — DSS 100 MG PO CAPS: 100.0000 mg | ORAL_CAPSULE | Freq: Two times a day (BID) | ORAL | Status: DC | PRN

## 2013-11-13 MED ORDER — DSS 100 MG PO CAPS: 100.0000 mg | ORAL_CAPSULE | Freq: Two times a day (BID) | ORAL | Status: DC

## 2013-11-13 MED ORDER — SPIRONOLACTONE 50 MG PO TABS: 50.0000 mg | ORAL_TABLET | Freq: Every day | ORAL | Status: DC

## 2013-11-13 MED ORDER — DICYCLOMINE HCL 10 MG PO CAPS: 10.0000 mg | ORAL_CAPSULE | Freq: Two times a day (BID) | ORAL | Status: DC

## 2013-11-13 MED ORDER — TRAMADOL HCL 50 MG PO TABS: 50.0000 mg | ORAL_TABLET | Freq: Four times a day (QID) | ORAL | Status: DC | PRN

## 2013-11-13 NOTE — Discharge Summary (Signed)
Physician Discharge Summary  Patient ID: Ethan Wade MRN: [id number] DOB/AGE: Jan 07, 1951 63 y.o.  Admit date: 11/10/2013 Discharge date: 11/13/2013  Primary Care Physician:  Gerrit Heck, MD  Discharge Diagnoses:    . Abdominal pain, epigastric, unclear etiology, possibly from adhessions from previous surgeries or musculoskeletal  . 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  Hepatocellular cancer, starting treatment at [facility], 0/93/26  . umbilical hernia, inguinal hernia  . hypertension  . Hepatitis C . Prostate enlargement  Consults:  Gastroenterology, Dr Cristina Gong                    General surgery, Dr. Rosendo Gros   Recommendations for Outpatient Follow-up:  Patient has followup appointment with Dr. Amedeo Plenty in 2 weeks. Please follow renal function.  Allergies:  No Known Allergies   Discharge Medications:   Medication List    STOP taking these medications       ibuprofen 600 MG tablet  Commonly known as:  ADVIL,MOTRIN      TAKE these medications       CLEAR EYES FOR DRY EYES 1-0.25 % Soln  Generic drug:  Carboxymethylcellul-Glycerin  Apply 1-2 drops to eye daily as needed. For dry eyes     dicyclomine 10 MG capsule  Commonly known as:  BENTYL  Take 1 capsule (10 mg total) by mouth 2 (two) times daily.     DSS 100 MG Caps  Take 100 mg by mouth 2 (two) times daily as needed (constipation). For constipation     finasteride 5 MG tablet  Commonly known as:  PROSCAR  Take 5 mg by mouth daily.     furosemide 20 MG tablet  Commonly known as:  LASIX  Take 20 mg by mouth.     insulin glargine 100 UNIT/ML injection  Commonly known as:  LANTUS  Inject 14 Units into the skin daily.     nadolol 20 MG tablet  Commonly known as:  CORGARD  Take 20 mg by mouth daily.     nadolol 20 MG tablet  Commonly known as:  CORGARD  Take 20 mg by mouth at bedtime.     ondansetron 4 MG disintegrating tablet  Commonly known as:  ZOFRAN ODT  Take 1  tablet (4 mg total) by mouth every 8 (eight) hours as needed for nausea or vomiting.     oxyCODONE 5 MG immediate release tablet  Commonly known as:  Oxy IR/ROXICODONE  Take 1 tablet (5 mg total) by mouth every 12 (twelve) hours as needed for severe pain or breakthrough pain (only for severe or breakthrough pain).     polyethylene glycol packet  Commonly known as:  MIRALAX / GLYCOLAX  Take 17 g by mouth daily as needed for mild constipation.     spironolactone-hydrochlorothiazide 50-50 MG per tablet  Commonly known as:  ALDACTAZIDE  Take 1 tablet by mouth daily.     tamsulosin 0.4 MG Caps capsule  Commonly known as:  FLOMAX  Take 0.4 mg by mouth daily.     traMADol 50 MG tablet  Commonly known as:  ULTRAM  Take 1 tablet (50 mg total) by mouth every 6 (six) hours as needed for moderate pain or severe pain.     Vitamin D (Ergocalciferol) 50000 UNITS Caps capsule  Commonly known as:  DRISDOL  Take 50,000 Units by mouth every Friday.     zolpidem 10 MG tablet  Commonly known as:  AMBIEN  Take 10 mg by mouth at bedtime  as needed. For sleep         Brief H and P: For complete details please refer to admission H and P, but in brief The patient is a 63 y.o. 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 Amedeo Plenty, chronic thrombocytopenia, ascites, hepatoma referred to [facility] for possible liver transplant, hypertension, diabetes mellitus, BPH, inguinal hernias who presents with abdominal pain. The patient was last at their baseline health several weeks ago when he developed increasing abdominal swelling. One week ago, he underwent his first ever therapeutic paracentesis with removal of 5L of fluid with symptomatic improvement. Dr. Amedeo Plenty also called him in prescriptions for diuretics which he has been taking since that time. The same day as his procedure, he developed epigastric pain which radiated to his back. Pain is stabbing  and progresses to 7-8/10 with movement/ambulation. He has to walk hunched over due to the pain but when resting, he feels only mild discomfort. Denies nausea, vomiting, diarrhea, constipation. A few weeks ago, he had some black tarry stools which have since resolved. Denies fevers, chills, dysuria, urinary frequency, urgency.  In the emergency department, he is mildly hypotensive at his baseline 95/63, heart rate 40s to 50s. Afebrile. White blood cell count 3.6, hemoglobin 13.7, platelets 78, similar to baseline except for slightly decreased hemoglobin from baseline. AST and ALT are within normal limits, lower than baseline. Albumin 2.9, decreased from baseline of 3.2. Bilirubin 2.1, near baseline. INR pending.    Hospital Course:  Chronic abdominal pain may be due to possible muscular skeletal or adhesions from previous surgeries. Patient had diagnostic paracentesis done, no SBP seen. Patient had a therapeutic paracentesis done on 11/03/13, his pain was noted worse after the paracentesis during this admission hence likely not from abdominal ascites. There was a concern if his umbilical hernia and inguinal hernia is causing this abdominal pain hence general surgery was consulted. Patient was seen by Dr. Rosendo Gros who conferred with Dr. Excell Seltzer and did not feel that his umbilical pain is due to his inguinal hernias. There was also concern that patient may have portal vein thrombosis, hepatic duplex was done which was negative for portal vein thrombosis. Ultrasound Doppler showed Main and right portal veins are patent with hepatopetal flow. Left portal vein was obscured. Follow GI recommendations. Hence there is no need of anticoagulation Patient has appointment on 8/25 at [facility] to begin treatment for hepatocellular carcinoma. I also made him an appointment with Dr. Amedeo Plenty. GI recommended antispasmodic, hence patient was started on dicyclomine and tramadol for pain control, narcotic only for severe or  breakthrough pain.  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 Banded about 7 years ago, last EGD/colo in 2011. Never been treated for HCV..  - Contiue lasix, spironolactone, HCTZ  - Continue nadolol   Hx of hepatocellular carcinoma referred to [facility] for possible liver transplant but declined due to location of lesions. Possible new lesion in left lobe.  - Planning for future embolization  - AFP elevated 97.0   Hypertension  - Currently borderline, continue following closely, on nadolol   Diabetes mellitus type 2 without complication  - Hemoglobin A1c 6.7, continue lantus and sliding scale insulin   BPH, enlarged prostate on CT, continue finasteride and flomax  - PSA ordered and in process  Inguinal hernias and previously repaired umbilical hernia, followed by Dr. Excell Seltzer. Surgery for more recent hernias deferred due to other medical  problems.   Leukopenia, followed by oncology. Chronic.     Day of Discharge BP 107/62  Pulse 75  Temp(Src) 98.1 F (36.7 C) (Oral)  Resp 17  Ht 5\' 10"  (1.778 m)  Wt 72.802 kg (160 lb 8 oz)  BMI 23.03 kg/m2  SpO2 97%  Physical Exam: General: Alert and awake oriented x3 not in any acute distress. CVS: S1-S2 clear no murmur rubs or gallops Chest: clear to auscultation bilaterally, no wheezing rales or rhonchi Abdomen: soft nontender, nondistended, normal bowel sounds Extremities: no cyanosis, clubbing or edema noted bilaterally Neuro: Cranial nerves II-XII intact, no focal neurological deficits   The results of significant diagnostics from this hospitalization (including imaging, microbiology, ancillary and laboratory) are listed below for reference.    LAB RESULTS: Basic Metabolic Panel:  Recent Labs Lab 11/12/13 0800 11/13/13 0515  NA 137 134*  K 4.7 3.9  CL 98 97  CO2 28 30  GLUCOSE 149* 110*  BUN 13 13  CREATININE 0.74 0.80  CALCIUM 9.2 9.1   Liver Function Tests:  Recent Labs Lab  11/12/13 0800 11/13/13 0515  AST 24 25  ALT 13 11  ALKPHOS 73 69  BILITOT 2.0* 2.0*  PROT 6.3 6.2  ALBUMIN 2.8* 2.8*    Recent Labs Lab 11/10/13 0945  LIPASE 29   No results found for this basename: AMMONIA,  in the last 168 hours CBC:  Recent Labs Lab 11/10/13 0945  11/12/13 0800 11/13/13 0515  WBC 3.6*  < > 3.0* 4.1  NEUTROABS 2.1  --   --   --   HGB 13.7  < > 13.6 13.1  HCT 38.0*  < > 38.2* 37.4*  MCV 99.2  < > 101.1* 99.7  PLT 78*  < > 66* 66*  < > = values in this interval not displayed. Cardiac Enzymes: No results found for this basename: CKTOTAL, CKMB, CKMBINDEX, TROPONINI,  in the last 168 hours BNP: No components found with this basename: POCBNP,  CBG:  Recent Labs Lab 11/12/13 2130 11/13/13 0829  GLUCAP 191* 189*    Significant Diagnostic Studies:  Ct Abdomen Pelvis W Contrast  11/10/2013   CLINICAL DATA:  Abdominal pain  EXAM: CT ABDOMEN AND PELVIS WITH CONTRAST  TECHNIQUE: Multidetector CT imaging of the abdomen and pelvis was performed using the standard protocol following bolus administration of intravenous contrast. Oral contrast was also administered.  CONTRAST:  66mL OMNIPAQUE IOHEXOL 300 MG/ML  SOLN  COMPARISON:  February 03, 2013 CT abdomen and pelvis and MR abdomen September 15, 2013  FINDINGS: There is no lung base edema or consolidation. There are multiple varices noted in the periesophageal regions as well as in the perigastric regions between the liver and stomach in the upper abdomen. This appearance is stable.  The liver has a nodular irregular contour with enlargement of the caudate lobe and relative hypoplasia of the remainder of the liver consistent with cirrhosis. There is again noted subtle enhancement of an area in the dome of the liver on the right, measuring 2.4 by 1.9 cm, stable. There is a second subtle area of enhancement in the medial segment of the left lobe of the liver measuring 3.1 x 2.1 cm, not seen on the prior CT and not  convincingly seen on recent prior MR. There is a third area of enhancement along the posterior segment of the right lobe of the liver measuring 2.9 x 1.9 cm, slightly better seen at this time compared to the prior CT and present  on recent MR. The overall attenuation of the liver is somewhat inhomogeneous consistent with the underlying cirrhosis.  There is a gallstone within the gallbladder. The gallbladder wall is not thickened. There is no biliary duct dilatation.  Spleen is enlarged, measuring 16.3 x 14.1 x 9.3 cm. There are no focal splenic lesions.  Pancreas and adrenals appear normal. Kidneys bilaterally show no hydronephrosis on either side. There is no renal or ureteral calculus on either side. There is again noted a cyst arising from the anterior right kidney lower pole region measuring 2.3 x 2.1 cm. No new renal masses are appreciable.  There is moderate generalized ascites.  In the pelvis, there is thickening of the wall of the urinary bladder. Prostate is prominent and inhomogeneous in attenuation. Fluid tracks through inguinal hernias bilaterally from the pelvis into each scrotal sac, more on the right than on the left. There is no pelvic mass. Appendix appears normal.  There are perirectal varices. There is some wall thickening in the distal sigmoid colon and rectum of uncertain etiology.  There is no appreciable bowel obstruction. There is no free air or portal venous air. There are periumbilical varices noted.  There is no adenopathy or abscess appreciable.  There is an area of apparent thrombus in the portal vein just beyond the confluence of the splenic and superior mesenteric veins.  There is atherosclerotic change in the aorta but no aneurysm. There is degenerative change in the lumbar spine. There are no blastic or lytic bone lesions.  IMPRESSION: Changes of hepatic cirrhosis. 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 There are  3 demonstrable subtle lesions which show enhancement within the liver as noted above. While these areas could represent dysplastic change, early hepatoma must be of concern. The lesions in the right lobe of the liver are noted on recent MR ; the area of concern in the left lobe may be new. If alpha-fetoprotein has not been performed, it would be reasonable to perform this laboratory study. There is mild generalized inhomogeneity to the attenuation of the liver, probably due to dysplastic change associated with the cirrhosis.  There are periesophageal, perigastric, and periumbilical varices. There is splenomegaly.  There is moderate ascites. Note that ascites tracks through inguinal hernias in each scrotal sac, larger on the right than on the left.  There is enlargement of the prostate with inhomogeneity to the prostate attenuation. Prostate impinges upon the base of the bladder but does not convincingly invade the bladder. There is some thickening of the urinary bladder wall. The thickening in the urinary bladder wall could be secondary to cystitis. With respect to the prostate, correlation with PSA is warranted given this appearance.  There is cholelithiasis.  There is focal wall thickening in the distal sigmoid colon and rectum. There are nearby perirectal varices. The wall thickening in the distal sigmoid colon and rectum could be secondary to muscular hypertrophy and/or as a result of internal hemorrhoids secondary to the variceal changes in these areas. This finding may warrant direct visualization to exclude the possibility of underlying neoplasm, however.   Electronically Signed   By: Lowella Grip M.D.   On: 11/10/2013 13:18   [place] Art/ven Flow Abd Pelv Doppler  11/12/2013   CLINICAL DATA:  Evaluate portal vein. Abnormal CT. Main portal vein partial thrombus is suggested.  EXAM: DUPLEX ULTRASOUND OF LIVER  TECHNIQUE: Color and duplex Doppler ultrasound was performed to evaluate the hepatic in-flow  and  out-flow vessels.  COMPARISON:  CT yesterday  FINDINGS: Portal Vein Velocities  Main:  34 cm/sec  Right:  19 cm/sec  Left:  Obscured cm/sec  Hepatic Vein Velocities  Right:  21 cm/sec  Middle:  18 cm/sec  Left:  37 cm/sec  Hepatic Artery Velocity:  100 cm/sec  Splenic Vein Velocity:  20 cm/sec  Varices: Absent  Ascites: Present  The left hepatic lobe and left portal vein were obscured by overlying bowel gas and could not be evaluated. The right and main portal veins are patent with hepatopetal flow. Hepatic veins are patent with hepatofugal flow. Splenic vein is also patent.  IMPRESSION: Main and right portal veins are patent with hepatopetal flow. Left portal vein was obscured. On the accompanying CT, the left portal vein is patent.   Electronically Signed   By: Maryclare Bean M.D.   On: 11/12/2013 09:14   US Paracentesis  11/11/2013   CLINICAL DATA:  Abdominal discomfort. Concern for peritonitis. Request diagnostic paracentesis.  EXAM: ULTRASOUND GUIDED PARACENTESIS  COMPARISON:  Previous paracentesis  PROCEDURE: An ultrasound guided paracentesis was thoroughly discussed with the patient and questions answered. The benefits, risks, alternatives and complications were also discussed. The patient understands and wishes to proceed with the procedure. Written consent was obtained.  Ultrasound finds small volume of residual ascites in the perihepatic and lower pelvic regions.  A small pocket of fluid in the right suprapubic region was localized for attempt at paracentesis. The area was then prepped and draped in the normal sterile fashion. 1% Lidocaine was used for local anesthesia. Under ultrasound guidance a 19 gauge Yueh catheter was introduced but only about 3 mLs were able to be aspirated. The catheter was removed and a dressing applied.  COMPLICATIONS: None immediate  FINDINGS: A total of approximately . 3 mL. of clear yellow fluid was removed. A fluid sample was sent for laboratory analysis.  IMPRESSION:  Successful ultrasound guided paracentesis yielding 3 mL of ascites.   Electronically Signed   By: Maryclare Bean M.D.   On: 11/11/2013 13:25       Disposition and Follow-up:     Discharge Instructions   Diet - low sodium heart healthy    Complete by:  As directed      Increase activity slowly    Complete by:  As directed             DISPOSITION: Home  DIET: Low-sodium diet   DISCHARGE FOLLOW-UP Follow-up Information   Follow up with Missy Sabins, MD On 11/27/2013. (for hospital follow-up at 11:00AM)    Specialty:  Gastroenterology   Contact information:   [address], [place] [address] [phone]       Follow up with Edward Jolly, MD. Schedule an appointment as soon as possible for a visit in 2 weeks. (for hospital follow-up, If symptoms worsen, As needed)    Specialty:  General Surgery   Contact information:   [place] [address] [phone]       Follow up with Gerrit Heck, MD. Schedule an appointment as soon as possible for a visit in 2 weeks. (for hospital follow-up)    Specialty:  Family Medicine   Contact information:   [place] [address] [phone]       Time spent on Discharge: 40 mins  Signed:   RAI,RIPUDEEP M.D. [facility] 11/13/2013, 11:16 AM Pager: [phone]   **Disclaimer: This note was dictated  with voice recognition software. Similar sounding words can inadvertently be transcribed and this note may contain transcription errors which may not have been corrected upon publication of note.**

## 2013-11-13 NOTE — Progress Notes (Signed)
Ethan Wade 10:57 AM  Subjective: Patient's case discussed with my partner and discussed with the patient and he has actually had this pain for a long time and we discussed how it may just be adhesions from previous surgery but he is able to eat and is moving his bowels and has an urgent appointment within a week at [facility] and no new complaints Objective: Vital signs stable afebrile no acute distress labs are stable and x-rays reviewed paracentesis results reviewed abdomen is soft essentially nontender no guarding or rebound  Assessment: Chronic abdominal pain in a patient with multiple medical problem  Plan: Okay to proceed with outpatient management and consider trying an antispasmodic like dicyclomine or hyoscyamine and possibly in the future as mild pain medicine as possible like ultram or Neurontin and my partner Dr. Amedeo Plenty happy to see back in followup and patient agrees with the plan [facility]

## 2013-11-13 NOTE — ED Provider Notes (Signed)
Medical screening examination/treatment/procedure(s) were performed by non-physician practitioner and as supervising physician I was immediately available for consultation/collaboration.   EKG Interpretation None        Ephraim Hamburger, MD 11/13/13 1345

## 2013-11-13 NOTE — Progress Notes (Signed)
PT Contact and Discharge Note  Patient Details Name: Ethan Wade MRN: [id number] DOB: 05-06-1950   Cancelled Treatment:    Reason Eval/Treat Not Completed: PT screened, no needs identified, will sign off Pt is managing independently in room and walking in hallway without difficulty;  Discussed case with RN;  Roney Marion, PT  Acute Rehabilitation Services Pager [phone] Office [phone]    Roney Marion [facility] 11/13/2013, 1:29 PM

## 2013-11-14 LAB — BODY FLUID CULTURE: CULTURE: NO GROWTH

## 2014-04-01 ENCOUNTER — Emergency Department (HOSPITAL_COMMUNITY): Payer: Medicare Other

## 2014-04-01 ENCOUNTER — Inpatient Hospital Stay (HOSPITAL_COMMUNITY)
Admission: EM | Admit: 2014-04-01 | Discharge: 2014-04-04 | DRG: 437 | Disposition: A | Discharge status: Home / Self Care | Payer: Medicare Other | Attending: Internal Medicine | Admitting: Internal Medicine

## 2014-04-01 ENCOUNTER — Encounter (HOSPITAL_COMMUNITY): Payer: Self-pay | Admitting: *Deleted

## 2014-04-01 DIAGNOSIS — I81 Portal vein thrombosis: Secondary | ICD-10-CM | POA: Diagnosis present

## 2014-04-01 DIAGNOSIS — R011 Cardiac murmur, unspecified: Secondary | ICD-10-CM | POA: Diagnosis present

## 2014-04-01 DIAGNOSIS — R7989 Other specified abnormal findings of blood chemistry: Secondary | ICD-10-CM | POA: Diagnosis present

## 2014-04-01 DIAGNOSIS — I341 Nonrheumatic mitral (valve) prolapse: Secondary | ICD-10-CM | POA: Diagnosis present

## 2014-04-01 DIAGNOSIS — C220 Liver cell carcinoma: Secondary | ICD-10-CM | POA: Diagnosis present

## 2014-04-01 DIAGNOSIS — M1990 Unspecified osteoarthritis, unspecified site: Secondary | ICD-10-CM | POA: Diagnosis present

## 2014-04-01 DIAGNOSIS — D696 Thrombocytopenia, unspecified: Secondary | ICD-10-CM | POA: Diagnosis present

## 2014-04-01 DIAGNOSIS — Z794 Long term (current) use of insulin: Secondary | ICD-10-CM | POA: Diagnosis not present

## 2014-04-01 DIAGNOSIS — Z87891 Personal history of nicotine dependence: Secondary | ICD-10-CM | POA: Diagnosis not present

## 2014-04-01 DIAGNOSIS — C227 Other specified carcinomas of liver: Secondary | ICD-10-CM

## 2014-04-01 DIAGNOSIS — R4182 Altered mental status, unspecified: Secondary | ICD-10-CM | POA: Diagnosis present

## 2014-04-01 DIAGNOSIS — N400 Enlarged prostate without lower urinary tract symptoms: Secondary | ICD-10-CM | POA: Diagnosis present

## 2014-04-01 DIAGNOSIS — K7290 Hepatic failure, unspecified without coma: Secondary | ICD-10-CM | POA: Diagnosis present

## 2014-04-01 DIAGNOSIS — E119 Type 2 diabetes mellitus without complications: Secondary | ICD-10-CM | POA: Diagnosis present

## 2014-04-01 DIAGNOSIS — R778 Other specified abnormalities of plasma proteins: Secondary | ICD-10-CM | POA: Diagnosis present

## 2014-04-01 DIAGNOSIS — I10 Essential (primary) hypertension: Secondary | ICD-10-CM | POA: Diagnosis present

## 2014-04-01 DIAGNOSIS — K7682 Hepatic encephalopathy: Secondary | ICD-10-CM | POA: Diagnosis present

## 2014-04-01 DIAGNOSIS — C229 Malignant neoplasm of liver, not specified as primary or secondary: Secondary | ICD-10-CM | POA: Diagnosis present

## 2014-04-01 DIAGNOSIS — B182 Chronic viral hepatitis C: Secondary | ICD-10-CM | POA: Diagnosis present

## 2014-04-01 DIAGNOSIS — K769 Liver disease, unspecified: Secondary | ICD-10-CM

## 2014-04-01 DIAGNOSIS — K7460 Unspecified cirrhosis of liver: Secondary | ICD-10-CM | POA: Insufficient documentation

## 2014-04-01 DIAGNOSIS — R410 Disorientation, unspecified: Secondary | ICD-10-CM

## 2014-04-01 DIAGNOSIS — G9340 Encephalopathy, unspecified: Secondary | ICD-10-CM | POA: Diagnosis present

## 2014-04-01 DIAGNOSIS — B1920 Unspecified viral hepatitis C without hepatic coma: Secondary | ICD-10-CM | POA: Diagnosis present

## 2014-04-01 LAB — COMPREHENSIVE METABOLIC PANEL
ALT: 39 U/L (ref 0–53)
AST: 50 U/L — ABNORMAL HIGH (ref 0–37)
Albumin: 3.6 g/dL (ref 3.5–5.2)
Alkaline Phosphatase: 88 U/L (ref 39–117)
Anion gap: 9 (ref 5–15)
BILIRUBIN TOTAL: 3.4 mg/dL — AB (ref 0.3–1.2)
BUN: 18 mg/dL (ref 6–23)
CHLORIDE: 98 meq/L (ref 96–112)
CO2: 24 mmol/L (ref 19–32)
Calcium: 9.3 mg/dL (ref 8.4–10.5)
Creatinine, Ser: 0.78 mg/dL (ref 0.50–1.35)
GLUCOSE: 227 mg/dL — AB (ref 70–99)
POTASSIUM: 3.6 mmol/L (ref 3.5–5.1)
SODIUM: 131 mmol/L — AB (ref 135–145)
Total Protein: 7.9 g/dL (ref 6.0–8.3)

## 2014-04-01 LAB — AMMONIA: Ammonia: 129 umol/L — ABNORMAL HIGH (ref 11–32)

## 2014-04-01 LAB — URINALYSIS, ROUTINE W REFLEX MICROSCOPIC
Bilirubin Urine: NEGATIVE
Glucose, UA: NEGATIVE mg/dL
Hgb urine dipstick: NEGATIVE
Ketones, ur: NEGATIVE mg/dL
Leukocytes, UA: NEGATIVE
Nitrite: NEGATIVE
Protein, ur: NEGATIVE mg/dL
Specific Gravity, Urine: 1.022 (ref 1.005–1.030)
UROBILINOGEN UA: 0.2 mg/dL (ref 0.0–1.0)
pH: 5 (ref 5.0–8.0)

## 2014-04-01 LAB — I-STAT TROPONIN, ED: Troponin i, poc: 0.01 ng/mL (ref 0.00–0.08)

## 2014-04-01 LAB — CBC
HCT: 43.5 % (ref 39.0–52.0)
Hemoglobin: 16.3 g/dL (ref 13.0–17.0)
MCH: 36.6 pg — ABNORMAL HIGH (ref 26.0–34.0)
MCHC: 37.5 g/dL — ABNORMAL HIGH (ref 30.0–36.0)
MCV: 97.8 fL (ref 78.0–100.0)
Platelets: 62 10*3/uL — ABNORMAL LOW (ref 150–400)
RBC: 4.45 MIL/uL (ref 4.22–5.81)
RDW: 13.7 % (ref 11.5–15.5)
WBC: 5 10*3/uL (ref 4.0–10.5)

## 2014-04-01 LAB — TROPONIN I: TROPONIN I: 0.04 ng/mL — AB (ref <0.031)

## 2014-04-01 LAB — PROTIME-INR
INR: 1.28 (ref 0.00–1.49)
PROTHROMBIN TIME: 16.1 s — AB (ref 11.6–15.2)

## 2014-04-01 LAB — CBG MONITORING, ED: Glucose-Capillary: 203 mg/dL — ABNORMAL HIGH (ref 70–99)

## 2014-04-01 LAB — LACTIC ACID, PLASMA: LACTIC ACID, VENOUS: 2.6 mmol/L — AB (ref 0.5–2.2)

## 2014-04-01 LAB — LIPASE, BLOOD: LIPASE: 36 U/L (ref 11–59)

## 2014-04-01 MED ORDER — LACTULOSE 10 GM/15ML PO SOLN
20.0000 g | Freq: Once | ORAL | Status: AC
  Administered 2014-04-01: 20 g via ORAL
  Filled 2014-04-01: qty 30

## 2014-04-01 MED ORDER — ASPIRIN 300 MG RE SUPP
300.0000 mg | Freq: Once | RECTAL | Status: AC
Start: 2014-04-01 — End: 2014-04-02
  Administered 2014-04-02: 300 mg via RECTAL
  Filled 2014-04-01: qty 1

## 2014-04-01 NOTE — H&P (Signed)
[facility] History and Physical  Ethan Wade SFK:[id number] DOB: [birth year] DOA: 04/01/2014  Referring physician: ED physician PCP: Gerrit Heck, MD  Specialists:   Chief Complaint: AMS  HPI: Ethan Wade is a 64 y.o. male with a past medical history of hypertension, BPH, diabetes mellitus, hepatitis C, liver cancer, who presented with altered mental status.  Patient has been noticed to be confused in the past several days. His confusion has been getting significantly worse this afternoon. Family members noticed that the patient was trying to use a sponge instead of his cellphone. A family member was standing outside of his door and could see him wandering in his house in a somewhat confused fashion. He has been more forgetful today. Patient reports that he feels weak, no appetite, does not want to eat food. There is no focal weakness, numbness or tingling. No chest pain or shortness of breath. Patient had history of alcoholism but has not been drinking alcohol for over 7 months. No fever, chills, headaches, cough, chest pain, SOB, abdominal pain, diarrhea, dysuria, urgency, frequency, hematuria, skin rashes or leg swelling. Of note, he has a history of hepatitis C, and recently diagnosed as a liver cancer. Currently he is taking Nexavar.   Work up in the ED demonstrates ammonia 129, negative CT head for acute abnormalities. Chest x-ray has no obvious infiltration. Urinalysis negative for UTI. INR 1.26. Troponin slightly elevated at 0.04. Lipase is 36. Chronic thrombocytopenia. Elevated transaminases with AST 50, ALT 39, total bilirubin 3.4. Patient is admitted to inpatient for further evaluation and treatment.   Review of Systems: As presented in the history of presenting illness, rest negative.  Where does patient live?  At home Can patient participate in ADLs? barely  Allergy: No Known Allergies  Past Medical History  Diagnosis Date  . Macrocytosis   .  Thrombocytopenia   . Esophageal bleed, non-variceal 2006  . HTN (hypertension)   . Psoriasis   . Mitral valve prolapse     OCCAS FLUTTER FEELING  . Vitamin D deficiency   . Umbilical hernia     OCCAS DISCOMFORT  . Prostate enlargement     PT TOLD "NORMAL FOR AGE" - TAKES FINASTERIDE AND FLOMAX  . Heart murmur   . Type II diabetes mellitus   . Rheumatic fever 1950's  . Hepatitis C 1980's  . Arthritis     "joint stiffness in the knees" (11/10/2013)  . Liver cancer     Past Surgical History  Procedure Laterality Date  . Tonsilectomy, adenoidectomy, bilateral myringotomy and tubes  child  . Umbilical hernia repair  03/09/2012    Procedure: HERNIA REPAIR UMBILICAL ADULT;  Surgeon: Edward Jolly, MD;  Location: [facility];  Service: General;  Laterality: N/A;  Repair Umbilical Hernia with Mesh  . Insertion of mesh  03/09/2012    Procedure: INSERTION OF MESH;  Surgeon: Edward Jolly, MD;  Location: [facility];  Service: General;  Laterality: N/A;  . Inguinal hernia repair Left 1970's  . Hernia repair    . Tonsillectomy      Social History:  reports that he has quit smoking. His smoking use included Cigars. He started smoking about 8 years ago. He has quit using smokeless tobacco. His smokeless tobacco use included Snuff. He reports that he drinks alcohol. He reports that he uses illicit drugs.  Family History:  Family History  Problem Relation Age of Onset  . Prostate cancer Brother   . COPD Mother   . Heart disease Father   .  Diabetes Father   . Alcohol abuse Father   . Liver cancer Brother   . Colon cancer Brother   . COPD Maternal Grandfather      Prior to Admission medications   Medication Sig Start Date End Date Taking? Authorizing Provider  finasteride (PROSCAR) 5 MG tablet Take 5 mg by mouth daily.   Yes Historical Provider, MD  ibuprofen (ADVIL,MOTRIN) 600 MG tablet Take 600 mg by mouth 3 (three) times daily as needed (pain).   Yes Historical Provider, MD   Insulin Detemir (LEVEMIR FLEXPEN) 100 UNIT/ML Pen Inject 14 Units into the skin daily.   Yes Historical Provider, MD  nadolol (CORGARD) 20 MG tablet Take 20 mg by mouth daily.   Yes Historical Provider, MD  oxyCODONE (OXY IR/ROXICODONE) 5 MG immediate release tablet Take 1 tablet (5 mg total) by mouth every 12 (twelve) hours as needed for severe pain or breakthrough pain (only for severe or breakthrough pain). Patient taking differently: Take 5 mg by mouth every 4 (four) hours as needed (pain).  11/13/13  Yes Ripudeep Krystal Eaton, MD  polyethylene glycol (MIRALAX / GLYCOLAX) packet Take 17 g by mouth daily as needed for mild constipation. Patient taking differently: Take 17 g by mouth daily as needed for mild constipation. Mix in 8 oz of water and drink 11/13/13  Yes Ripudeep K Rai, MD  SORAfenib (NEXAVAR) 200 MG tablet Take 400 mg by mouth every 12 (twelve) hours. Take on an empty stomach 1 hour before or 2 hours after meals.   Yes Historical Provider, MD  Tamsulosin HCl (FLOMAX) 0.4 MG CAPS Take 0.4 mg by mouth daily.   Yes Historical Provider, MD  Vitamin D, Ergocalciferol, (DRISDOL) 50000 UNITS CAPS Take 50,000 Units by mouth every Friday.   Yes Historical Provider, MD  Carboxymethylcellul-Glycerin (CLEAR EYES FOR DRY EYES) 1-0.25 % SOLN Apply 1-2 drops to eye daily as needed. For dry eyes    Historical Provider, MD  dicyclomine (BENTYL) 10 MG capsule Take 1 capsule (10 mg total) by mouth 2 (two) times daily. 11/13/13   Ripudeep Krystal Eaton, MD  Docusate Sodium (DSS) 100 MG CAPS Take 100 mg by mouth 2 (two) times daily as needed (constipation). For constipation 11/13/13   Ripudeep Krystal Eaton, MD  furosemide (LASIX) 20 MG tablet Take 20 mg by mouth.    Historical Provider, MD  ondansetron (ZOFRAN ODT) 4 MG disintegrating tablet Take 1 tablet (4 mg total) by mouth every 8 (eight) hours as needed for nausea or vomiting. 11/13/13   Ripudeep Krystal Eaton, MD  spironolactone-hydrochlorothiazide (ALDACTAZIDE) 50-50 MG per tablet  Take 1 tablet by mouth daily.    Historical Provider, MD  traMADol (ULTRAM) 50 MG tablet Take 1 tablet (50 mg total) by mouth every 6 (six) hours as needed for moderate pain or severe pain. 11/13/13   Ripudeep Krystal Eaton, MD  zolpidem (AMBIEN) 10 MG tablet Take 10 mg by mouth at bedtime.     Historical Provider, MD    Physical Exam: Filed Vitals:   04/01/14 2215 04/01/14 2245 04/01/14 2315 04/01/14 2345  BP: 138/96 138/84 160/98 149/92  Pulse: 83 83 85 86  Temp:      TempSrc:      Resp:      SpO2: 97% 96% 97% 98%   General: Not in acute distress HEENT:       Eyes: PERRL, EOMI, no scleral icterus       ENT: No discharge from the ears and nose, no pharynx injection,  no tonsillar enlargement.        Neck: No JVD, no bruit, no mass felt. Cardiac: S1/S2, RRR, No murmurs, No gallops or rubs Pulm: Good air movement bilaterally. Clear to auscultation bilaterally. No rales, wheezing, rhonchi or rubs. Abd: Soft, nondistended, nontender, no rebound pain, no organomegaly, BS present Ext: No edema bilaterally. 2+DP/PT pulse bilaterally Musculoskeletal: No joint deformities, erythema, or stiffness, ROM full Skin: No rashes.  Neuro: Alert and oriented only to place, but not to time and person. Followed commands. cranial nerves II-XII grossly intact, muscle strength 5/5 in all extremeties, sensation to light touch intact. Brachial reflex 2+ bilaterally. Knee reflex 1+ bilaterally. Negative Babinski's sign. Normal finger to nose test. No asterixis.  Psych: Patient is not psychotic, no suicidal or hemocidal ideation.  Labs on Admission:  Basic Metabolic Panel:  Recent Labs Lab 04/01/14 1824  NA 131*  K 3.6  CL 98  CO2 24  GLUCOSE 227*  BUN 18  CREATININE 0.78  CALCIUM 9.3   Liver Function Tests:  Recent Labs Lab 04/01/14 1824  AST 50*  ALT 39  ALKPHOS 88  BILITOT 3.4*  PROT 7.9  ALBUMIN 3.6    Recent Labs Lab 04/01/14 1825  LIPASE 36    Recent Labs Lab 04/01/14 1738  AMMONIA  129*   CBC:  Recent Labs Lab 04/01/14 1824  WBC 5.0  HGB 16.3  HCT 43.5  MCV 97.8  PLT 62*   Cardiac Enzymes:  Recent Labs Lab 04/01/14 1825 04/01/14 2254  TROPONINI 0.04* <0.03    BNP (last 3 results) No results for input(s): PROBNP in the last 8760 hours. CBG:  Recent Labs Lab 04/01/14 1847  GLUCAP 203*    Radiological Exams on Admission: Dg Chest 2 View  04/01/2014   CLINICAL DATA:  Altered mental status.  Hypertension.  Diabetes.  EXAM: CHEST  2 VIEW  COMPARISON:  03/04/2012  FINDINGS: Mildly prominent right contour of the ascending thoracic aorta and, possibly due to rightward rotation. Retrocardiac density resembling hiatal hernia is attributable to the patient's known large uphill varices. Heart size within normal limits. The lungs appear clear.  IMPRESSION: 1. Mild prominence of the ascending aortic right margin is probably due to rightward rotation, and less likely to be due to ectasia of the ascending thoracic aorta. 2. No acute findings to explain the patient'  s symptoms.   Electronically Signed   By: Sherryl Barters M.D.   On: 04/01/2014 20:11   Ct Head Wo Contrast  04/01/2014   CLINICAL DATA:  New onset of weakness and disorientation. Altered mental status.  EXAM: CT HEAD WITHOUT CONTRAST  TECHNIQUE: Contiguous axial images were obtained from the base of the skull through the vertex without intravenous contrast.  COMPARISON:  None.  FINDINGS: No acute cortical infarct, hemorrhage, or mass lesion is present. The ventricles are of normal size. No significant extra-axial fluid collection is evident. The paranasal sinuses and mastoid air cells are clear. The calvarium is intact.  IMPRESSION: Negative CT of the head.   Electronically Signed   By: Lawrence Santiago M.D.   On: 04/01/2014 20:39    EKG: Independently reviewed. QTc interval 490.  Assessment/Plan Principal Problem:   Acute encephalopathy Active Problems:   Thrombocytopenia   Hepatitis C   Esophageal  bleed, non-variceal   HTN (hypertension)   Psoriasis   Mitral valve prolapse   Prostate enlargement   PVT (portal vein thrombosis)   Liver cancer   Elevated troponin  Acute encephalopathy: It  is likely due to hepatic encephalopathy given his significant history of liver cancer, hepatitis C and cirrhosis. Elevated ammonia level 129 is consistent with hepatic encephalopathy, though not diagnostic. Other differential diagnoses include, ACS (patient does not have any chest pain, but troponin is elevated), pulmonary embolism (less likely, given no any chest pain, and no oxygen desaturation), TIA/stroke (less likely given no focal neurological symptoms or findings on examination), and TTP (patient has thrombocytopenia in the setting of acute encephalopathy, but patient's thrombocytopenia is chronic likely secondary to cirrhosis and hepatitis C. Though less likely, still need to rule out TTP).  - will admit to SDU - start lactulose: 30 g tid by rectal until passing swallowing test - Frequent neuro check -IV fluids: Normal saline 75 mL per hour -Nothing by mouth - inr/PTT, LDH, haptoglobin, peripheral smear  Elevated trop: no significant change on EKG. No chest pain. Likely due to demanding ischemia secondary to encephalopathy - cycle CE q6 x3 and repeat her EKG in the am  - Nitroglycerin, Morphine, and aspirin, lipitor  - repeat Stat Trop now. If trop continues to increase, will start heparin and get cardiology consult  Thrombocytopenia: likely due to [facility] and possible component of portal hypertension.  recently has been stable 9. Folate 66 on 11/13/13-->62 on this admission). No bleeding. 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  -  hold lasix, spironolactone, HCTZ since he will be treated with Lactulose, to avoid dehydration -  Continue nadolol  Hepatomas: Referred to [facility] for possible  liver transplant, but was declined due to location of lesions?. AFP 97 on 11/11/13 -continue Nexavar  Hypertension: -continue nadolol  Diabetes mellitus type 2: Last A1c 6.7 on 11/11/13. Levemir 14 units daily at home. - decrease lantus dose to 10 units due to NPO -  Add low dose SSI  BPH: -continue Finasteride and flomax  Mild hyponatremia: likely due to cirrhosis -Trend sodium by BMP   DVT ppx: SCD Code Status: Full code Family Communication: None at bed side.   Disposition Plan: Admit to inpatient   Date of Service 04/01/2014    Ivor Costa [facility] Pager [phone]  If 7PM-7AM, please contact night-coverage [email or web address] Password [email or web address] 04/01/2014, 11:59 PM

## 2014-04-01 NOTE — ED Notes (Signed)
MD at bedside. 

## 2014-04-01 NOTE — ED Provider Notes (Signed)
CSN: [id number]     Arrival date & time 04/01/14  1812 History   First MD Initiated Contact with Patient 04/01/14 [place]     Chief Complaint  Patient presents with  . Altered Mental Status     (Consider location/radiation/quality/duration/timing/severity/associated sxs/prior Treatment) HPI The patient has been developing incremental confusion. There have been some small episodes of forgetfulness. Today however family members noticed that the patient was trying to use a sponge instead of his cellphone. A family member was standing outside of his door and could see him wandering in his house in a somewhat confused fashion. He has been more forgetful today. The patient reports he is aware that he is having problems with disorientation today. There is been no focal weakness numbness or tingling. No focal gait dysfunction. No fever, no nausea no vomiting. The patient does report significantly decreased appetite today. He did eat yesterday with family members present. The patient has known liver failure. He is not a candidate for transplant. Patient had a history of alcoholism but has not been drinking alcohol for over 7 months. Past Medical History  Diagnosis Date  . Macrocytosis   . Thrombocytopenia   . Esophageal bleed, non-variceal 2006  . HTN (hypertension)   . Psoriasis   . Mitral valve prolapse     OCCAS FLUTTER FEELING  . Vitamin D deficiency   . Umbilical hernia     OCCAS DISCOMFORT  . Prostate enlargement     PT TOLD "NORMAL FOR AGE" - TAKES FINASTERIDE AND FLOMAX  . Heart murmur   . Type II diabetes mellitus   . Rheumatic fever 1950's  . Hepatitis C 1980's  . Arthritis     "joint stiffness in the knees" (11/10/2013)  . Liver cancer    Past Surgical History  Procedure Laterality Date  . Tonsilectomy, adenoidectomy, bilateral myringotomy and tubes  child  . Umbilical hernia repair  03/09/2012    Procedure: HERNIA REPAIR UMBILICAL ADULT;  Surgeon: Edward Jolly, MD;   Location: [facility];  Service: General;  Laterality: N/A;  Repair Umbilical Hernia with Mesh  . Insertion of mesh  03/09/2012    Procedure: INSERTION OF MESH;  Surgeon: Edward Jolly, MD;  Location: [facility];  Service: General;  Laterality: N/A;  . Inguinal hernia repair Left 1970's  . Hernia repair    . Tonsillectomy     Family History  Problem Relation Age of Onset  . Prostate cancer Brother   . COPD Mother   . Heart disease Father   . Diabetes Father   . Alcohol abuse Father   . Liver cancer Brother   . Colon cancer Brother   . COPD Maternal Grandfather    History  Substance Use Topics  . Smoking status: Former Smoker -- 20 years    Types: Cigars    Start date: 12/22/2005  . Smokeless tobacco: Former Systems developer    Types: Snuff     Comment: "quit smoking ~ 2009; quit snuff in ~ 2013"  . Alcohol Use: Yes     Comment: "quit in late April 2015"    Review of Systems  10 Systems reviewed and are negative for acute change except as noted in the HPI.   Allergies  Review of patient's allergies indicates no known allergies.  Home Medications   Prior to Admission medications   Medication Sig Start Date End Date Taking? Authorizing Provider  finasteride (PROSCAR) 5 MG tablet Take 5 mg by mouth daily.   Yes Historical  Provider, MD  ibuprofen (ADVIL,MOTRIN) 600 MG tablet Take 600 mg by mouth 3 (three) times daily as needed (pain).   Yes Historical Provider, MD  Insulin Detemir (LEVEMIR FLEXPEN) 100 UNIT/ML Pen Inject 14 Units into the skin daily.   Yes Historical Provider, MD  nadolol (CORGARD) 20 MG tablet Take 20 mg by mouth daily.   Yes Historical Provider, MD  oxyCODONE (OXY IR/ROXICODONE) 5 MG immediate release tablet Take 1 tablet (5 mg total) by mouth every 12 (twelve) hours as needed for severe pain or breakthrough pain (only for severe or breakthrough pain). Patient taking differently: Take 5 mg by mouth every 4 (four) hours as needed (pain).  11/13/13  Yes Ripudeep Krystal Eaton, MD   polyethylene glycol (MIRALAX / GLYCOLAX) packet Take 17 g by mouth daily as needed for mild constipation. Patient taking differently: Take 17 g by mouth daily as needed for mild constipation. Mix in 8 oz of water and drink 11/13/13  Yes Ripudeep K Rai, MD  SORAfenib (NEXAVAR) 200 MG tablet Take 400 mg by mouth every 12 (twelve) hours. Take on an empty stomach 1 hour before or 2 hours after meals.   Yes Historical Provider, MD  Tamsulosin HCl (FLOMAX) 0.4 MG CAPS Take 0.4 mg by mouth daily.   Yes Historical Provider, MD  Vitamin D, Ergocalciferol, (DRISDOL) 50000 UNITS CAPS Take 50,000 Units by mouth every Friday.   Yes Historical Provider, MD  Carboxymethylcellul-Glycerin (CLEAR EYES FOR DRY EYES) 1-0.25 % SOLN Apply 1-2 drops to eye daily as needed. For dry eyes    Historical Provider, MD  dicyclomine (BENTYL) 10 MG capsule Take 1 capsule (10 mg total) by mouth 2 (two) times daily. 11/13/13   Ripudeep Krystal Eaton, MD  Docusate Sodium (DSS) 100 MG CAPS Take 100 mg by mouth 2 (two) times daily as needed (constipation). For constipation 11/13/13   Ripudeep Krystal Eaton, MD  furosemide (LASIX) 20 MG tablet Take 20 mg by mouth.    Historical Provider, MD  ondansetron (ZOFRAN ODT) 4 MG disintegrating tablet Take 1 tablet (4 mg total) by mouth every 8 (eight) hours as needed for nausea or vomiting. 11/13/13   Ripudeep Krystal Eaton, MD  spironolactone-hydrochlorothiazide (ALDACTAZIDE) 50-50 MG per tablet Take 1 tablet by mouth daily.    Historical Provider, MD  traMADol (ULTRAM) 50 MG tablet Take 1 tablet (50 mg total) by mouth every 6 (six) hours as needed for moderate pain or severe pain. 11/13/13   Ripudeep Krystal Eaton, MD  zolpidem (AMBIEN) 10 MG tablet Take 10 mg by mouth at bedtime.     Historical Provider, MD   BP 141/99 mmHg  Pulse 82  Temp(Src) 97.9 F (36.6 C) (Oral)  Resp 16  SpO2 99% Physical Exam  Constitutional:  The patient is slender but well-nourished and well-developed. He is well-groomed. He has no respiratory  distress. His color is good. He is alert in appearance.  HENT:  Head: Normocephalic and atraumatic.  Eyes: Conjunctivae and EOM are normal. Pupils are equal, round, and reactive to light. Right eye exhibits no discharge. Left eye exhibits no discharge. No scleral icterus.  Neck: Neck supple.  Cardiovascular: Normal rate, regular rhythm, normal heart sounds and intact distal pulses.   Pulmonary/Chest: Effort normal and breath sounds normal.  Abdominal: Soft. Bowel sounds are normal. He exhibits no distension. There is no tenderness.  Patient does have a firm palpable liver margin. This is nontender. There is no abdominal ascites.  Musculoskeletal: Normal range of motion. He exhibits  no edema or tenderness.  Neurological: He is alert. He has normal strength. No cranial nerve deficit. Coordination normal. GCS eye subscore is 4. GCS verbal subscore is 5. GCS motor subscore is 6.  At this time the patient is interactive. He is an appropriate historian. His speech is clear. He is not showing signs of obtundation or somnolence. He follows all commands appropriately.  Skin: Skin is warm, dry and intact.  Psychiatric: He has a normal mood and affect.    ED Course  Procedures (including critical care time) Labs Review Labs Reviewed  CBC - Abnormal; Notable for the following:    MCH 36.6 (*)    MCHC 37.5 (*)    Platelets 62 (*)    All other components within normal limits  COMPREHENSIVE METABOLIC PANEL - Abnormal; Notable for the following:    Sodium 131 (*)    Glucose, Bld 227 (*)    AST 50 (*)    Total Bilirubin 3.4 (*)    All other components within normal limits  URINALYSIS, ROUTINE W REFLEX MICROSCOPIC - Abnormal; Notable for the following:    Color, Urine AMBER (*)    APPearance CLOUDY (*)    All other components within normal limits  TROPONIN I - Abnormal; Notable for the following:    Troponin I 0.04 (*)    All other components within normal limits  PROTIME-INR - Abnormal; Notable  for the following:    Prothrombin Time 16.1 (*)    All other components within normal limits  AMMONIA - Abnormal; Notable for the following:    Ammonia 129 (*)    All other components within normal limits  LACTIC ACID, PLASMA - Abnormal; Notable for the following:    Lactic Acid, Venous 2.6 (*)    All other components within normal limits  CBG MONITORING, ED - Abnormal; Notable for the following:    Glucose-Capillary 203 (*)    All other components within normal limits  CULTURE, BLOOD (ROUTINE X 2)  CULTURE, BLOOD (ROUTINE X 2)  LIPASE, BLOOD  TROPONIN I  Randolm Idol, ED    Imaging Review Dg Chest 2 View  04/01/2014   CLINICAL DATA:  Altered mental status.  Hypertension.  Diabetes.  EXAM: CHEST  2 VIEW  COMPARISON:  03/04/2012  FINDINGS: Mildly prominent right contour of the ascending thoracic aorta and, possibly due to rightward rotation. Retrocardiac density resembling hiatal hernia is attributable to the patient's known large uphill varices. Heart size within normal limits. The lungs appear clear.  IMPRESSION: 1. Mild prominence of the ascending aortic right margin is probably due to rightward rotation, and less likely to be due to ectasia of the ascending thoracic aorta. 2. No acute findings to explain the patient'  s symptoms.   Electronically Signed   By: Sherryl Barters M.D.   On: 04/01/2014 20:11   Ct Head Wo Contrast  04/01/2014   CLINICAL DATA:  New onset of weakness and disorientation. Altered mental status.  EXAM: CT HEAD WITHOUT CONTRAST  TECHNIQUE: Contiguous axial images were obtained from the base of the skull through the vertex without intravenous contrast.  COMPARISON:  None.  FINDINGS: No acute cortical infarct, hemorrhage, or mass lesion is present. The ventricles are of normal size. No significant extra-axial fluid collection is evident. The paranasal sinuses and mastoid air cells are clear. The calvarium is intact.  IMPRESSION: Negative CT of the head.    Electronically Signed   By: Lawrence Santiago M.D.   On: 04/01/2014  20:39     EKG Interpretation None      MDM   Final diagnoses:  Disorientation  Increased ammonia level  Chronic liver disease and cirrhosis  Hepatic encephalopathy   At this time the patient is found to have a significantly elevated ammonia level. He denies he has had this problem in the past. He denies he has treated with lactulose in the past he reports he is not familiar with that medication. At this point by history it appears that the patient is symptomatic with waxing and waning degrees of hepatic encephalopathy. He is nontoxic and has stable vital signs. He is currently lucid. At this time he will be admitted for further treatment and observation for hepatic encephalopathy. He does have known end-stage liver disease with hepatocellular carcinoma and cirrhosis.    Charlesetta Shanks, MD 04/01/14 2255

## 2014-04-01 NOTE — ED Notes (Signed)
Per family, last spoke with pt yesterday. Pt reports onset this afternoon of feeling weak and disoriented. Repetitive questions at triage. Denies any other symptoms such as urinary or n/v/d.

## 2014-04-02 DIAGNOSIS — K7290 Hepatic failure, unspecified without coma: Secondary | ICD-10-CM

## 2014-04-02 DIAGNOSIS — B182 Chronic viral hepatitis C: Secondary | ICD-10-CM

## 2014-04-02 LAB — COMPREHENSIVE METABOLIC PANEL
ALBUMIN: 2.8 g/dL — AB (ref 3.5–5.2)
ALT: 34 U/L (ref 0–53)
AST: 43 U/L — ABNORMAL HIGH (ref 0–37)
Alkaline Phosphatase: 67 U/L (ref 39–117)
Anion gap: 8 (ref 5–15)
BUN: 17 mg/dL (ref 6–23)
CO2: 26 mmol/L (ref 19–32)
CREATININE: 0.64 mg/dL (ref 0.50–1.35)
Calcium: 8.7 mg/dL (ref 8.4–10.5)
Chloride: 103 mEq/L (ref 96–112)
GFR calc Af Amer: >90 mL/min (ref >90)
GFR calc non Af Amer: >90 mL/min (ref >90)
Glucose, Bld: 159 mg/dL — ABNORMAL HIGH (ref 70–99)
Potassium: 3.4 mmol/L — ABNORMAL LOW (ref 3.5–5.1)
Sodium: 137 mmol/L (ref 135–145)
TOTAL PROTEIN: 6.2 g/dL (ref 6.0–8.3)
Total Bilirubin: 2.8 mg/dL — ABNORMAL HIGH (ref 0.3–1.2)

## 2014-04-02 LAB — PROTIME-INR
INR: 1.35 (ref 0.00–1.49)
Prothrombin Time: 16.8 seconds — ABNORMAL HIGH (ref 11.6–15.2)

## 2014-04-02 LAB — MRSA PCR SCREENING: MRSA BY PCR: NEGATIVE

## 2014-04-02 LAB — CBC
HCT: 40.9 % (ref 39.0–52.0)
HEMOGLOBIN: 15.3 g/dL (ref 13.0–17.0)
MCH: 36.3 pg — ABNORMAL HIGH (ref 26.0–34.0)
MCHC: 37.4 g/dL — ABNORMAL HIGH (ref 30.0–36.0)
MCV: 97.1 fL (ref 78.0–100.0)
PLATELETS: 60 10*3/uL — AB (ref 150–400)
RBC: 4.21 MIL/uL — AB (ref 4.22–5.81)
RDW: 13.4 % (ref 11.5–15.5)
WBC: 4.8 10*3/uL (ref 4.0–10.5)

## 2014-04-02 LAB — AMMONIA: Ammonia: 88 umol/L — ABNORMAL HIGH (ref 11–32)

## 2014-04-02 LAB — GLUCOSE, CAPILLARY
GLUCOSE-CAPILLARY: 119 mg/dL — AB (ref 70–99)
Glucose-Capillary: 170 mg/dL — ABNORMAL HIGH (ref 70–99)
Glucose-Capillary: 150 mg/dL — ABNORMAL HIGH (ref 70–99)
Glucose-Capillary: 225 mg/dL — ABNORMAL HIGH (ref 70–99)

## 2014-04-02 LAB — HAPTOGLOBIN: Haptoglobin: <15 mg/dL — ABNORMAL LOW (ref 43–212)

## 2014-04-02 LAB — TROPONIN I
Troponin I: <0.03 ng/mL (ref <0.031)
Troponin I: 0.03 ng/mL (ref <0.031)

## 2014-04-02 LAB — LACTATE DEHYDROGENASE: LDH: 235 U/L (ref 94–250)

## 2014-04-02 LAB — HEMOGLOBIN A1C
Hgb A1c MFr Bld: 6.5 % — ABNORMAL HIGH (ref <5.7)
Mean Plasma Glucose: 140 mg/dL — ABNORMAL HIGH (ref <117)

## 2014-04-02 LAB — APTT: aPTT: 35 seconds (ref 24–37)

## 2014-04-02 MED ORDER — INSULIN DETEMIR 100 UNIT/ML ~~LOC~~ SOLN
10.0000 [IU] | Freq: Every day | SUBCUTANEOUS | Status: DC
  Filled 2014-04-02: qty 0.1

## 2014-04-02 MED ORDER — LACTULOSE 10 GM/15ML PO SOLN
30.0000 g | Freq: Two times a day (BID) | ORAL | Status: DC
  Administered 2014-04-02 – 2014-04-04 (×4): 30 g via ORAL
  Filled 2014-04-02 (×6): qty 45

## 2014-04-02 MED ORDER — CARBOXYMETHYLCELLUL-GLYCERIN 1-0.25 % OP SOLN: 1.0000 [drp] | Freq: Every day | OPHTHALMIC | Status: DC | PRN

## 2014-04-02 MED ORDER — INSULIN ASPART 100 UNIT/ML ~~LOC~~ SOLN: 0.0000 [IU] | Freq: Three times a day (TID) | SUBCUTANEOUS | Status: DC

## 2014-04-02 MED ORDER — SODIUM CHLORIDE 0.9 % IV SOLN
INTRAVENOUS | Status: DC
  Administered 2014-04-02: 01:00:00 via INTRAVENOUS

## 2014-04-02 MED ORDER — ZOLPIDEM TARTRATE 5 MG PO TABS
5.0000 mg | ORAL_TABLET | Freq: Once | ORAL | Status: AC
  Administered 2014-04-02: 5 mg via ORAL
  Filled 2014-04-02: qty 1

## 2014-04-02 MED ORDER — DICYCLOMINE HCL 10 MG PO CAPS
10.0000 mg | ORAL_CAPSULE | Freq: Two times a day (BID) | ORAL | Status: DC
  Administered 2014-04-02 – 2014-04-04 (×5): 10 mg via ORAL
  Filled 2014-04-02 (×6): qty 1

## 2014-04-02 MED ORDER — FINASTERIDE 5 MG PO TABS
5.0000 mg | ORAL_TABLET | Freq: Every day | ORAL | Status: DC
  Filled 2014-04-02: qty 1

## 2014-04-02 MED ORDER — DSS 100 MG PO CAPS: 100.0000 mg | ORAL_CAPSULE | Freq: Two times a day (BID) | ORAL | Status: DC | PRN

## 2014-04-02 MED ORDER — OXYCODONE HCL 5 MG PO TABS
5.0000 mg | ORAL_TABLET | Freq: Two times a day (BID) | ORAL | Status: DC | PRN
Start: 2014-04-02 — End: 2014-04-04

## 2014-04-02 MED ORDER — NITROGLYCERIN 0.4 MG SL SUBL: 0.4000 mg | SUBLINGUAL_TABLET | SUBLINGUAL | Status: DC | PRN

## 2014-04-02 MED ORDER — FINASTERIDE 5 MG PO TABS
5.0000 mg | ORAL_TABLET | Freq: Every day | ORAL | Status: DC
Start: 2014-04-02 — End: 2014-04-04
  Administered 2014-04-02 – 2014-04-04 (×3): 5 mg via ORAL
  Filled 2014-04-02 (×3): qty 1

## 2014-04-02 MED ORDER — NADOLOL 20 MG PO TABS
20.0000 mg | ORAL_TABLET | Freq: Every day | ORAL | Status: DC
  Administered 2014-04-02 – 2014-04-04 (×3): 20 mg via ORAL
  Filled 2014-04-02 (×5): qty 1

## 2014-04-02 MED ORDER — ONDANSETRON 4 MG PO TBDP
4.0000 mg | ORAL_TABLET | Freq: Four times a day (QID) | ORAL | Status: DC | PRN
  Filled 2014-04-02: qty 1

## 2014-04-02 MED ORDER — POLYVINYL ALCOHOL 1.4 % OP SOLN
1.0000 [drp] | Freq: Every day | OPHTHALMIC | Status: DC | PRN
  Filled 2014-04-02: qty 15

## 2014-04-02 MED ORDER — SORAFENIB TOSYLATE 200 MG PO TABS
400.0000 mg | ORAL_TABLET | Freq: Two times a day (BID) | ORAL | Status: DC
  Administered 2014-04-02 – 2014-04-04 (×4): 400 mg via ORAL
  Filled 2014-04-02: qty 2

## 2014-04-02 MED ORDER — OXYCODONE HCL 5 MG PO TABS: 5.0000 mg | ORAL_TABLET | Freq: Two times a day (BID) | ORAL | Status: DC | PRN

## 2014-04-02 MED ORDER — ATORVASTATIN CALCIUM 80 MG PO TABS
80.0000 mg | ORAL_TABLET | Freq: Every day | ORAL | Status: DC
  Filled 2014-04-02: qty 1

## 2014-04-02 MED ORDER — TAMSULOSIN HCL 0.4 MG PO CAPS
0.4000 mg | ORAL_CAPSULE | Freq: Every day | ORAL | Status: DC
  Filled 2014-04-02: qty 1

## 2014-04-02 MED ORDER — SODIUM CHLORIDE 0.9 % IV SOLN
INTRAVENOUS | Status: DC
  Administered 2014-04-02 – 2014-04-03 (×5): via INTRAVENOUS

## 2014-04-02 MED ORDER — DOCUSATE SODIUM 100 MG PO CAPS
100.0000 mg | ORAL_CAPSULE | Freq: Two times a day (BID) | ORAL | Status: DC | PRN
  Filled 2014-04-02: qty 1

## 2014-04-02 MED ORDER — INSULIN ASPART 100 UNIT/ML ~~LOC~~ SOLN
0.0000 [IU] | SUBCUTANEOUS | Status: DC
  Administered 2014-04-02: 1 [IU] via SUBCUTANEOUS

## 2014-04-02 MED ORDER — LACTULOSE ENEMA
300.0000 mL | Freq: Three times a day (TID) | ORAL | Status: DC
  Administered 2014-04-02 (×2): 300 mL via RECTAL
  Filled 2014-04-02 (×4): qty 300

## 2014-04-02 MED ORDER — ONDANSETRON 4 MG PO TBDP
4.0000 mg | ORAL_TABLET | Freq: Three times a day (TID) | ORAL | Status: DC | PRN
  Filled 2014-04-02: qty 1

## 2014-04-02 MED ORDER — SODIUM CHLORIDE 0.9 % IJ SOLN
3.0000 mL | Freq: Two times a day (BID) | INTRAMUSCULAR | Status: DC
  Administered 2014-04-02 – 2014-04-04 (×6): 3 mL via INTRAVENOUS

## 2014-04-02 MED ORDER — SORAFENIB TOSYLATE 200 MG PO TABS: 400.0000 mg | ORAL_TABLET | Freq: Two times a day (BID) | ORAL | Status: DC

## 2014-04-02 MED ORDER — ASPIRIN EC 81 MG PO TBEC
81.0000 mg | DELAYED_RELEASE_TABLET | Freq: Every day | ORAL | Status: DC
  Filled 2014-04-02: qty 1

## 2014-04-02 MED ORDER — CEFTRIAXONE SODIUM IN DEXTROSE 20 MG/ML IV SOLN
1.0000 g | INTRAVENOUS | Status: DC
  Administered 2014-04-02: 1 g via INTRAVENOUS
  Filled 2014-04-02 (×2): qty 50

## 2014-04-02 MED ORDER — SORAFENIB TOSYLATE 200 MG PO TABS
400.0000 mg | ORAL_TABLET | Freq: Two times a day (BID) | ORAL | Status: DC
  Filled 2014-04-02 (×2): qty 2

## 2014-04-02 MED ORDER — INSULIN ASPART 100 UNIT/ML ~~LOC~~ SOLN
0.0000 [IU] | Freq: Three times a day (TID) | SUBCUTANEOUS | Status: DC
  Administered 2014-04-02: 2 [IU] via SUBCUTANEOUS
  Administered 2014-04-03 (×2): 3 [IU] via SUBCUTANEOUS

## 2014-04-02 MED ORDER — DOCUSATE SODIUM 100 MG PO CAPS: 100.0000 mg | ORAL_CAPSULE | Freq: Two times a day (BID) | ORAL | Status: DC | PRN

## 2014-04-02 MED ORDER — NADOLOL 20 MG PO TABS
20.0000 mg | ORAL_TABLET | Freq: Every day | ORAL | Status: DC
Start: 2014-04-02 — End: 2014-04-02
  Filled 2014-04-02: qty 1

## 2014-04-02 MED ORDER — DICYCLOMINE HCL 10 MG PO CAPS
10.0000 mg | ORAL_CAPSULE | Freq: Two times a day (BID) | ORAL | Status: DC
  Filled 2014-04-02 (×2): qty 1

## 2014-04-02 MED ORDER — PROCHLORPERAZINE MALEATE 10 MG PO TABS
10.0000 mg | ORAL_TABLET | Freq: Four times a day (QID) | ORAL | Status: DC | PRN
Start: 2014-04-02 — End: 2014-04-04
  Filled 2014-04-02: qty 1

## 2014-04-02 MED ORDER — TRAMADOL HCL 50 MG PO TABS: 50.0000 mg | ORAL_TABLET | Freq: Four times a day (QID) | ORAL | Status: DC | PRN

## 2014-04-02 NOTE — Progress Notes (Signed)
Patient to transfer to [id number] report given to receiving nurse Antoinette all questions answered at this time.  Patient stable at transfer.

## 2014-04-02 NOTE — Progress Notes (Signed)
OT Cancellation Note  Patient Details Name: Ethan Wade MRN: [id number] DOB: [birth year]   Cancelled Treatment:    Reason Eval/Treat Not Completed: Patient not medically ready. Pt on bed rest. Please update activity orders when appropriate for OT evaluation.  Benito Mccreedy OTR/L [phone] 04/02/2014, 8:18 AM

## 2014-04-02 NOTE — Progress Notes (Signed)
PT Cancellation Note  Patient Details Name: Ethan Wade MRN: [id number] DOB: 1950-11-23   Cancelled Treatment:    Reason Eval/Treat Not Completed: Patient not medically ready.  Pt currently on strict bedrest.  Please advance activity order when appropriate.     Bobby Barton, Thornton Papas 04/02/2014, 7:39 AM

## 2014-04-02 NOTE — Care Management Note (Addendum)
  Page 1 of 1   04/04/2014     11:29:18 AM CARE MANAGEMENT NOTE 04/04/2014  Patient:  Ethan Wade, Ethan Wade   Account Number:  [id number]  Date Initiated:  04/02/2014  Documentation initiated by:  Marvetta Gibbons  Subjective/Objective Assessment:   Pt admitted with hepatic eneppalopathy     Action/Plan:   PTA pt lived at home alone- PT/OT evals   Anticipated DC Date:  04/04/2014   Anticipated DC Plan:  [place]         Choice offered to / List presented to:  C-1 Patient   DME arranged  3-N-1      DME agency  [place] arranged  HH-1 RN  [place].   Status of service:  Completed, signed off Medicare Important Message given?  YES (If response is "NO", the following Medicare IM given date fields will be blank) Date Medicare IM given:  04/04/2014 Medicare IM given by:  Magdalen Spatz Date Additional Medicare IM given:   Additional Medicare IM given by:    Discharge Disposition:  [place]  Per UR Regulation:  Reviewed for med. necessity/level of care/duration of stay  If discussed at [place] of Stay Meetings, dates discussed:    Comments:  04/02/14- 1700- Marvetta Gibbons RN, BSN [phone] Spoke with pt and family at bedside- regarding d/c needs- per pt he has a brother that can help at some taking him to appointments and such- per PT/OT notes no recommendations for [facility]- DME need for 3n1 which pt does want- MD please place order for DME- 3n1-

## 2014-04-02 NOTE — Evaluation (Signed)
Occupational Therapy Evaluation Patient Details Name: Ethan Wade MRN: [id number] DOB: 12-01-1950 Today's Date: 04/02/2014    History of Present Illness pt presents with Hepatic Encephalopathy.     Clinical Impression   Pt admitted with above. Pt independent with ADLs, PTA. Feel pt will benefit from acute OT to increase independence with BADLs prior to d/c.    Follow Up Recommendations  No OT follow up;Supervision - Intermittent    Equipment Recommendations  3 in 1 bedside comode    Recommendations for Other Services       Precautions / Restrictions Precautions Precautions: Fall Restrictions Weight Bearing Restrictions: No      Mobility Bed Mobility Overal bed mobility: Needs Assistance Bed Mobility: Supine to Sit;Sit to Supine     Supine to sit: Supervision Sit to supine: Supervision   General bed mobility comments: only for lines  Transfers Overall transfer level: Needs assistance Equipment used: None Transfers: Sit to/from Stand Sit to Stand: Supervision                   ADL Overall ADL's : Needs assistance/impaired     Grooming: Wash/dry face;Standing;Set up;Supervision/safety   Upper Body Bathing: Set up;Supervision/ safety;Standing   Lower Body Bathing: Set up;Supervison/ safety;Sit to/from stand   Upper Body Dressing : Set up;Sitting   Lower Body Dressing: Set up;Supervision/safety;Sit to/from stand   Toilet Transfer: Min guard;Ambulation;Supervision/safety (bed)           Functional mobility during ADLs: Min guard;Supervision/safety-pt slightly unsteady with ambulation General ADL Comments: Educated on safety tips, such as safe shoewear, rugs/items on floor, sitting for most of LB ADLs, and recommended someone be with him for tub transfer. Discussed shower chair options and also multiple uses of 3 in 1.  Educated on safe tub transfer technique.     Vision  Pt wears glasses-reports no recent change from baseline                     Perception     Praxis      Pertinent Vitals/Pain Pain Assessment: No/denies pain     Hand Dominance     Extremity/Trunk Assessment Upper Extremity Assessment Upper Extremity Assessment: Overall WFL for tasks assessed   Lower Extremity Assessment Lower Extremity Assessment: Defer to PT evaluation   Cervical / Trunk Assessment Cervical / Trunk Assessment: Normal   Communication Communication Communication: No difficulties   Cognition Arousal/Alertness: Awake/alert Behavior During Therapy: WFL for tasks assessed/performed Overall Cognitive Status: No family/caregiver present to determine baseline cognitive functioning (slow processing; decreased short term memory)       Memory: Decreased short-term memory             General Comments       Exercises       Shoulder Instructions      Home Living Family/patient expects to be discharged to:: Private residence Living Arrangements: Alone Available Help at Discharge: Family;Available PRN/intermittently Type of Home: House Home Access: Stairs to enter [employer] of Steps: 3 Entrance Stairs-Rails:  (rails being added) Home Layout: One level     Bathroom Shower/Tub: Teacher, early years/pre: Standard     Home Equipment: Environmental consultant - 2 wheels;Cane - single point          Prior Functioning/Environment Level of Independence: Independent        Comments: pt notes using a SPC if he's not feeling well.      OT Diagnosis: Generalized weakness   OT Problem  List: Decreased strength;Decreased cognition;Decreased knowledge of use of DME or AE;Decreased knowledge of precautions   OT Treatment/Interventions: Self-care/ADL training;DME and/or AE instruction;Therapeutic activities;Patient/family education;Balance training    OT Goals(Current goals can be found in the care plan section) Acute Rehab OT Goals Patient Stated Goal: get back to doing thing he was before/taking care of  things OT Goal Formulation: With patient Time For Goal Achievement: 04/09/14 Potential to Achieve Goals: Good  OT Frequency: Min 2X/week   Barriers to D/C:            Co-evaluation              End of Session Equipment Utilized During Treatment: Gait belt  Activity Tolerance: Patient tolerated treatment well Patient left: in bed;with call bell/phone within reach   Time: 1323-1344 OT Time Calculation (min): 21 min Charges:  OT General Charges $OT Visit: 1 Procedure OT Evaluation $Initial OT Evaluation Tier I: 1 Procedure OT Treatments $Self Care/Home Management : 8-22 mins G-CodesBenito Mccreedy OTR/L [phone] 04/02/2014, 2:02 PM

## 2014-04-02 NOTE — Evaluation (Signed)
Physical Therapy Evaluation Patient Details Name: Ethan Wade MRN: [id number] DOB: 1950/04/18 Today's Date: 04/02/2014   History of Present Illness  pt presents with Hepatic Encephalopathy.    Clinical Impression  Pt demos good mobility and no need for physical A.  Pt seems to have mild balance deficits, but question if this is more due to time spent in bed as opposed to truly a balance deficit.  Encouraged pt to ambulate with staff and will f/u to ensure return to baseline.      Follow Up Recommendations No PT follow up;Supervision - Intermittent    Equipment Recommendations  None recommended by PT    Recommendations for Other Services       Precautions / Restrictions Precautions Precautions: Fall Restrictions Weight Bearing Restrictions: No      Mobility  Bed Mobility Overal bed mobility: Modified Independent             General bed mobility comments: pt moves slowly, but demos good safety and no A needed.    Transfers Overall transfer level: Needs assistance Equipment used: None Transfers: Sit to/from Stand Sit to Stand: Modified independent (Device/Increase time)            Ambulation/Gait Ambulation/Gait assistance: Supervision Ambulation Distance (Feet): 250 Feet Assistive device: None Gait Pattern/deviations: Step-through pattern;Decreased stride length     General Gait Details: pt moves slowly and has a little difficulty with balance challenges, but no LOB.    Stairs            Wheelchair Mobility    Modified Rankin (Stroke Patients Only)       Balance Overall balance assessment: No apparent balance deficits (not formally assessed)                                           Pertinent Vitals/Pain Pain Assessment: No/denies pain    Home Living Family/patient expects to be discharged to:: Private residence Living Arrangements: Alone Available Help at Discharge: Family;Available PRN/intermittently Type of Home:  House Home Access: Stairs to enter Entrance Stairs-Rails:  (Rails being added.  ) Entrance Stairs-Number of Steps: 3 Home Layout: One level Home Equipment: Walker - 2 wheels;Cane - single point      Prior Function Level of Independence: Independent         Comments: pt notes using a SPC if he's not feeling well.       Hand Dominance        Extremity/Trunk Assessment   Upper Extremity Assessment: Defer to OT evaluation           Lower Extremity Assessment: Overall WFL for tasks assessed      Cervical / Trunk Assessment: Normal  Communication   Communication: No difficulties  Cognition Arousal/Alertness: Awake/alert Behavior During Therapy: WFL for tasks assessed/performed Overall Cognitive Status: Within Functional Limits for tasks assessed                      General Comments      Exercises        Assessment/Plan    PT Assessment Patient needs continued PT services  PT Diagnosis Difficulty walking   PT Problem List Decreased activity tolerance;Decreased balance;Decreased mobility;Decreased knowledge of use of DME  PT Treatment Interventions Gait training;Stair training;Functional mobility training;Therapeutic activities;Therapeutic exercise;Balance training;Patient/family education   PT Goals (Current goals can be found in the Care Plan section)  Acute Rehab PT Goals Patient Stated Goal: Home PT Goal Formulation: With patient Time For Goal Achievement: 04/09/14 Potential to Achieve Goals: Good    Frequency Min 3X/week   Barriers to discharge        Co-evaluation               End of Session Equipment Utilized During Treatment: Gait belt Activity Tolerance: Patient tolerated treatment well Patient left: in chair;with call bell/phone within reach Nurse Communication: Mobility status         Time: 0017-4944 PT Time Calculation (min) (ACUTE ONLY): 21 min   Charges:   PT Evaluation $Initial PT Evaluation Tier I: 1  Procedure PT Treatments $Gait Training: 8-22 mins   PT G CodesCatarina Hartshorn, [place] 04/02/2014, 1:03 PM

## 2014-04-02 NOTE — Progress Notes (Signed)
[facility]  Ethan Wade NWG:[id number] DOB: September 24, 1950 DOA: 04/01/2014 PCP: Gerrit Heck, MD  Brief narrative: 64 year male with history of hypertension, BPH, diabetes, and cirrhosis related to hepatitis C as well as primary liver malignancy followed by [facility] who presented to the hospital with altered mentation. Family noted progressive confusion over several days. He was brought to the emergency department where patient endorsed feeling weak, having no appetite and not wanting to eat food. In the ER his ammonia level was 129. CT head was negative for acute abnormality. Chest x-ray was negative for obvious acute infection. Urinalysis negative for UTI. Patient only had mildly elevated transaminases but did have mild elevation in total bilirubin 3.4.  HPI/Subjective: Pt awake; confusion has resolved. Patient denies prior history of hepatic encephalopathy.  Assessment/Plan:    Chronic hepatitis C with cirrhosis / liver malignancy -Followed by [facility]- total bilirubin greater than 3 and per recommendations preadmission Nexavar has been placed on hold -repeat total bilirubin as of 1/4 down to 2.8 so can likely resume Nexavar soon-no definite ascites, no fever or leukocytosis so likely can discontinue empiric SBP coverage with Rocephin    Hepatic encephalopathy -clinically improving-ammonia level down to 88-continue lactulose-likely will need to discharge on this medication-given several day history of confusion and poor intake suspect has a degree of dehydration so we'll continue IV fluids until proves can tolerate diet    Thrombocytopenia -Chronic and at baseline readings between 60 and 70,000    HTN  -Home Lasix on hold-resume Corgard since has history of esophageal varices-spironolactone with hydrochlorothiazide also on hold    Prostate enlargement -Continue pro-scar - if blood pressure tolerates resumption of Corgard will then resume  Flomax   Diabetes mellitus -Holding Lantus until proves tolerates diet-continue sliding scale insulin for now    PVT (portal vein thrombosis) -Onset August 2014-not on anticoagulation prior to admission    Elevated troponin -Subsequent troponins have been negative 3 collections   DVT prophylaxis: SCDs Code Status: Full Family Communication: No family at bedside Disposition Plan/Expected LOS: Stepdown   Consultants: None  Procedures: None  Cultures: Cultures 2 pending  Antibiotics: Rocephin 1/3 >  Objective: Blood pressure 124/81, pulse 86, temperature 98.1 F (36.7 C), temperature source Oral, resp. rate 24, height 5\' 10"  (1.778 m), weight 142 lb 6.7 oz (64.6 kg), SpO2 97 %.  Intake/Output Summary (Last 24 hours) at 04/02/14 1323 Last data filed at 04/02/14 1100  Gross per 24 hour  Intake  737.5 ml  Output    300 ml  Net  437.5 ml   Exam: Gen: No acute respiratory distress Chest: Clear to auscultation bilaterally without wheezes, rhonchi or crackles, room air Cardiac: Regular rate and rhythm, S1-S2, no rubs murmurs or gallops, no peripheral edema, no JVD Abdomen: Soft nontender nondistended, no ascites Extremities: Symmetrical in appearance without cyanosis, clubbing or effusion  Scheduled Meds:  Scheduled Meds: . cefTRIAXone (ROCEPHIN)  IV  1 g Intravenous Q24H  . dicyclomine  10 mg Oral BID  . finasteride  5 mg Oral Daily  . insulin aspart  0-9 Units Subcutaneous 6 times per day  . lactulose  300 mL Rectal TID  . nadolol  20 mg Oral Daily  . sodium chloride  3 mL Intravenous Q12H    Data Reviewed: Basic Metabolic Panel:  Recent Labs Lab 04/01/14 1824 04/02/14 0758  NA 131* 137  K 3.6 3.4*  CL 98 103  CO2 24 26  GLUCOSE 227* 159*  BUN 18 17  CREATININE 0.78 0.64  CALCIUM 9.3 8.7   Liver Function Tests:  Recent Labs Lab 04/01/14 1824 04/02/14 0758  AST 50* 43*  ALT 39 34  ALKPHOS 88 67  BILITOT 3.4* 2.8*  PROT 7.9 6.2  ALBUMIN  3.6 2.8*    Recent Labs Lab 04/01/14 1825  LIPASE 36    Recent Labs Lab 04/01/14 1738 04/02/14 1017  AMMONIA 129* 88*   CBC:  Recent Labs Lab 04/01/14 1824 04/02/14 0758  WBC 5.0 4.8  HGB 16.3 15.3  HCT 43.5 40.9  MCV 97.8 97.1  PLT 62* 60*   Cardiac Enzymes:  Recent Labs Lab 04/01/14 1825 04/01/14 2254 04/02/14 0135 04/02/14 0758  TROPONINI 0.04* <0.03 <0.03 0.03   CBG:  Recent Labs Lab 04/01/14 1847 04/02/14 0048 04/02/14 0849 04/02/14 1120  GLUCAP 203* 170* 150* 119*    Recent Results (from the past 240 hour(s))  MRSA PCR Screening     Status: None   Collection Time: 04/02/14  1:26 AM  Result Value Ref Range Status   MRSA by PCR NEGATIVE NEGATIVE Final    Comment:        The GeneXpert MRSA Assay (FDA approved for NASAL specimens only), is one component of a comprehensive MRSA colonization surveillance program. It is not intended to diagnose MRSA infection nor to guide or monitor treatment for MRSA infections.      Studies:  Recent x-ray studies have been reviewed in detail by the Attending Physician  Time spent :  [place], ANP [facility] Office  [phone] Pager [phone]  On-Call/Text Page:      [email or web address]      password [email or web address]  If 7PM-7AM, please contact night-coverage [email or web address] Password [email or web address] 04/02/2014, 1:23 PM   LOS: 1 day   I have personally examined this patient and reviewed the entire database. I have reviewed the above note, made any necessary editorial changes, and agree with its content.  Cherene Altes, MD [facility]

## 2014-04-02 NOTE — Progress Notes (Signed)
Utilization review completed.  

## 2014-04-03 DIAGNOSIS — I81 Portal vein thrombosis: Secondary | ICD-10-CM

## 2014-04-03 DIAGNOSIS — E119 Type 2 diabetes mellitus without complications: Secondary | ICD-10-CM

## 2014-04-03 DIAGNOSIS — R7989 Other specified abnormal findings of blood chemistry: Secondary | ICD-10-CM | POA: Insufficient documentation

## 2014-04-03 LAB — GLUCOSE, CAPILLARY
GLUCOSE-CAPILLARY: 255 mg/dL — AB (ref 70–99)
Glucose-Capillary: 204 mg/dL — ABNORMAL HIGH (ref 70–99)
Glucose-Capillary: 202 mg/dL — ABNORMAL HIGH (ref 70–99)
Glucose-Capillary: 201 mg/dL — ABNORMAL HIGH (ref 70–99)
Glucose-Capillary: 199 mg/dL — ABNORMAL HIGH (ref 70–99)

## 2014-04-03 LAB — CBC
HCT: 37.7 % — ABNORMAL LOW (ref 39.0–52.0)
Hemoglobin: 13.8 g/dL (ref 13.0–17.0)
MCH: 36.1 pg — ABNORMAL HIGH (ref 26.0–34.0)
MCHC: 36.6 g/dL — ABNORMAL HIGH (ref 30.0–36.0)
MCV: 98.7 fL (ref 78.0–100.0)
PLATELETS: 54 10*3/uL — AB (ref 150–400)
RBC: 3.82 MIL/uL — ABNORMAL LOW (ref 4.22–5.81)
RDW: 13.3 % (ref 11.5–15.5)
WBC: 4.4 10*3/uL (ref 4.0–10.5)

## 2014-04-03 LAB — HEPATIC FUNCTION PANEL
ALBUMIN: 2.5 g/dL — AB (ref 3.5–5.2)
ALK PHOS: 65 U/L (ref 39–117)
ALT: 29 U/L (ref 0–53)
AST: 39 U/L — ABNORMAL HIGH (ref 0–37)
Bilirubin, Direct: 0.5 mg/dL — ABNORMAL HIGH (ref 0.0–0.3)
Indirect Bilirubin: 1.4 mg/dL — ABNORMAL HIGH (ref 0.3–0.9)
Total Bilirubin: 1.9 mg/dL — ABNORMAL HIGH (ref 0.3–1.2)
Total Protein: 5.5 g/dL — ABNORMAL LOW (ref 6.0–8.3)

## 2014-04-03 LAB — BASIC METABOLIC PANEL
Anion gap: 7 (ref 5–15)
BUN: 12 mg/dL (ref 6–23)
CO2: 25 mmol/L (ref 19–32)
Calcium: 7.9 mg/dL — ABNORMAL LOW (ref 8.4–10.5)
Chloride: 105 mEq/L (ref 96–112)
Creatinine, Ser: 0.68 mg/dL (ref 0.50–1.35)
GFR calc Af Amer: >90 mL/min (ref >90)
GFR calc non Af Amer: >90 mL/min (ref >90)
GLUCOSE: 165 mg/dL — AB (ref 70–99)
Potassium: 3.6 mmol/L (ref 3.5–5.1)
Sodium: 137 mmol/L (ref 135–145)

## 2014-04-03 MED ORDER — ZOLPIDEM TARTRATE 5 MG PO TABS
5.0000 mg | ORAL_TABLET | Freq: Once | ORAL | Status: AC
  Administered 2014-04-03: 5 mg via ORAL
  Filled 2014-04-03: qty 1

## 2014-04-03 MED ORDER — INSULIN ASPART 100 UNIT/ML ~~LOC~~ SOLN
0.0000 [IU] | SUBCUTANEOUS | Status: DC
  Administered 2014-04-03: 5 [IU] via SUBCUTANEOUS
  Administered 2014-04-04: 3 [IU] via SUBCUTANEOUS
  Administered 2014-04-04: 2 [IU] via SUBCUTANEOUS

## 2014-04-03 NOTE — Progress Notes (Signed)
[place] TEAM 1 - Stepdown/ICU TEAM Progress Note  Ethan Wade TKZ:[id number] DOB: [birth year] DOA: 04/01/2014 PCP: Gerrit Heck, MD  Admit HPI / Brief Narrative: 64 y.o. male with a past medical history of hypertension, BPH, diabetes mellitus, hepatitis C, liver cancer, who presented with altered mental status.  Patient has been noticed to be confused in the past several days. His confusion has been getting significantly worse this afternoon. Family members noticed that the patient was trying to use a sponge instead of his cellphone. A family member was standing outside of his door and could see him wandering in his house in a somewhat confused fashion. He has been more forgetful today. Patient reports that he feels weak, no appetite, does not want to eat food. There is no focal weakness, numbness or tingling. No chest pain or shortness of breath. Patient had history of alcoholism but has not been drinking alcohol for over 7 months. No fever, chills, headaches, cough, chest pain, SOB, abdominal pain, diarrhea, dysuria, urgency, frequency, hematuria, skin rashes or leg swelling. Of note, he has a history of hepatitis C, and recently diagnosed as a liver cancer. Currently he is taking Nexavar.   Work up in the ED demonstrates ammonia 129, negative CT head for acute abnormalities. Chest x-ray has no obvious infiltration. Urinalysis negative for UTI. INR 1.26. Troponin slightly elevated at 0.04. Lipase is 36. Chronic thrombocytopenia. Elevated transaminases with AST 50, ALT 39, total bilirubin 3.4. Patient is admitted to inpatient for further evaluation and treatment.   Review of Systems: As presented in the history of presenting illness, rest negative.   HPI/Subjective: 1/5 A/O 4, complaining of loose stools. Negative CP, negative SOB, negative N/V   Assessment/Plan:  Chronic hepatitis C with cirrhosis / liver malignancy -Followed by [facility]- total bilirubin greater than 3 and per  recommendations preadmission Nexavar has been placed on hold  -repeat total bilirubin as of 1/5 down to 1.9 so can likely resume Nexavar soon-no definite ascites, no fever or leukocytosis so likely can discontinue empiric SBP coverage with Rocephin   Hepatic encephalopathy/increased ammonia level -clinically improving,ammonia level down to 88 on 1/4 -Obtain ammonia level in a.m., will decrease lactulose to 30 g daily this should help with his loose stools and hopefully still maintain ammonia level at reasonable level.   Thrombocytopenia -Chronic and at baseline readings between 60 and 70,000 -1/5 slightly below baseline, trend   HTN  -Home Lasix on hold -Continue nadolol 20 mg daily since has history of esophageal varices -Continue to hold spironolactone with hydrochlorothiazide; not sure patient's BP will tolerate    Prostate enlargement -Continue prosscar 5 mg daily  Diabetes mellitus Type 2 controlled -1/4 hemoglobin A1c = 6.5  -Continue to hold Lantus my: Patient's diet improving but still not consuming much. -Increase to moderate SSI   PVT (portal vein thrombosis) -Onset August 2014-not on anticoagulation prior to admission   Elevated troponin -Subsequent troponins have been negative 3 collections   Code Status: FULL Family Communication: no family present at time of exam Disposition Plan: Discharge in a.m. if tolerates tonight's meal    Consultants: NA  Procedure/Significant Events:    Culture Cultures 2 pending   Antibiotics: Rocephin 1/3 >   DVT prophylaxis: SCD   Devices    LINES / TUBES:      Continuous Infusions: . sodium chloride 50 mL/hr at 04/03/14 1550    Objective: VITAL SIGNS: Temp: 98.1 F (36.7 C) (01/05 1433) Temp Source: Oral (01/05 1433) BP: 146/86 mmHg (  01/05 1433) Pulse Rate: 66 (01/05 1433) SPO2; FIO2:   Intake/Output Summary (Last 24 hours) at 04/03/14 1727 Last data filed at 04/03/14 1500  Gross per  24 hour  Intake   1555 ml  Output    150 ml  Net   1405 ml     Exam: General: A/O 4, NAD, No acute respiratory distress Lungs: Clear to auscultation bilaterally without wheezes or crackles Cardiovascular: Regular rate and rhythm without murmur gallop or rub normal S1 and S2 Abdomen: Nontender, nondistended, soft, bowel sounds positive, no rebound, no ascites, no appreciable mass Extremities: No significant cyanosis, clubbing, or edema bilateral lower extremities  Data Reviewed: Basic Metabolic Panel:  Recent Labs Lab 04/01/14 1824 04/02/14 0758 04/03/14 0439  NA 131* 137 137  K 3.6 3.4* 3.6  CL 98 103 105  CO2 24 26 25   GLUCOSE 227* 159* 165*  BUN 18 17 12   CREATININE 0.78 0.64 0.68  CALCIUM 9.3 8.7 7.9*   Liver Function Tests:  Recent Labs Lab 04/01/14 1824 04/02/14 0758 04/03/14 0439  AST 50* 43* 39*  ALT 39 34 29  ALKPHOS 88 67 65  BILITOT 3.4* 2.8* 1.9*  PROT 7.9 6.2 5.5*  ALBUMIN 3.6 2.8* 2.5*    Recent Labs Lab 04/01/14 1825  LIPASE 36    Recent Labs Lab 04/01/14 1738 04/02/14 1017  AMMONIA 129* 88*   CBC:  Recent Labs Lab 04/01/14 1824 04/02/14 0758 04/03/14 0439  WBC 5.0 4.8 4.4  HGB 16.3 15.3 13.8  HCT 43.5 40.9 37.7*  MCV 97.8 97.1 98.7  PLT 62* 60* 54*   Cardiac Enzymes:  Recent Labs Lab 04/01/14 1825 04/01/14 2254 04/02/14 0135 04/02/14 0758  TROPONINI 0.04* <0.03 <0.03 0.03   BNP (last 3 results) No results for input(s): PROBNP in the last 8760 hours. CBG:  Recent Labs Lab 04/02/14 0849 04/02/14 1120 04/02/14 2143 04/03/14 0843 04/03/14 1154  GLUCAP 150* 119* 225* 204* 202*    Recent Results (from the past 240 hour(s))  Culture, blood (routine x 2)     Status: None (Preliminary result)   Collection Time: 04/01/14  5:38 PM  Result Value Ref Range Status   Specimen Description BLOOD RIGHT ANTECUBITAL  Final   Special Requests BOTTLES DRAWN AEROBIC ONLY 4CC  Final   Culture   Final           BLOOD CULTURE  RECEIVED NO GROWTH TO DATE CULTURE WILL BE HELD FOR 5 DAYS BEFORE ISSUING A FINAL NEGATIVE REPORT Performed at [facility]    Report Status PENDING  Incomplete  Culture, blood (routine x 2)     Status: None (Preliminary result)   Collection Time: 04/01/14  7:30 PM  Result Value Ref Range Status   Specimen Description BLOOD LEFT FOREARM  Final   Special Requests BOTTLES DRAWN AEROBIC AND ANAEROBIC 4CC EA  Final   Culture   Final           BLOOD CULTURE RECEIVED NO GROWTH TO DATE CULTURE WILL BE HELD FOR 5 DAYS BEFORE ISSUING A FINAL NEGATIVE REPORT Performed at [facility]    Report Status PENDING  Incomplete  MRSA PCR Screening     Status: None   Collection Time: 04/02/14  1:26 AM  Result Value Ref Range Status   MRSA by PCR NEGATIVE NEGATIVE Final    Comment:        The GeneXpert MRSA Assay (FDA approved for NASAL specimens only), is one component of a comprehensive  MRSA colonization surveillance program. It is not intended to diagnose MRSA infection nor to guide or monitor treatment for MRSA infections.      Studies:  Recent x-ray studies have been reviewed in detail by the Attending Physician  Scheduled Meds:  Scheduled Meds: . dicyclomine  10 mg Oral BID  . finasteride  5 mg Oral Daily  . insulin aspart  0-9 Units Subcutaneous TID WC  . lactulose  30 g Oral BID  . nadolol  20 mg Oral Daily  . sodium chloride  3 mL Intravenous Q12H  . SORAfenib  400 mg Oral Q12H    Time spent on care of this patient: 40 mins   Allie Bossier , MD   [facility] Office  [phone] Pager [phone]  On-Call/Text Page:      [email or web address]      password [email or web address]  If 7PM-7AM, please contact night-coverage [email or web address] Password [email or web address] 04/03/2014, 5:27 PM   LOS: 2 days

## 2014-04-04 LAB — CBC WITH DIFFERENTIAL/PLATELET
BASOS PCT: 0 % (ref 0–1)
Basophils Absolute: 0 10*3/uL (ref 0.0–0.1)
EOS ABS: 0.2 10*3/uL (ref 0.0–0.7)
Eosinophils Relative: 4 % (ref 0–5)
HCT: 37.7 % — ABNORMAL LOW (ref 39.0–52.0)
Hemoglobin: 13.7 g/dL (ref 13.0–17.0)
Lymphocytes Relative: 29 % (ref 12–46)
Lymphs Abs: 1.2 10*3/uL (ref 0.7–4.0)
MCH: 35.8 pg — ABNORMAL HIGH (ref 26.0–34.0)
MCHC: 36.3 g/dL — AB (ref 30.0–36.0)
MCV: 98.4 fL (ref 78.0–100.0)
Monocytes Absolute: 0.5 10*3/uL (ref 0.1–1.0)
Monocytes Relative: 11 % (ref 3–12)
NEUTROS ABS: 2.3 10*3/uL (ref 1.7–7.7)
NEUTROS PCT: 56 % (ref 43–77)
Platelets: 52 10*3/uL — ABNORMAL LOW (ref 150–400)
RBC: 3.83 MIL/uL — AB (ref 4.22–5.81)
RDW: 13.3 % (ref 11.5–15.5)
WBC: 4.1 10*3/uL (ref 4.0–10.5)

## 2014-04-04 LAB — COMPREHENSIVE METABOLIC PANEL
ALBUMIN: 2.5 g/dL — AB (ref 3.5–5.2)
ALK PHOS: 67 U/L (ref 39–117)
ALT: 28 U/L (ref 0–53)
AST: 44 U/L — ABNORMAL HIGH (ref 0–37)
Anion gap: 5 (ref 5–15)
BUN: 10 mg/dL (ref 6–23)
CHLORIDE: 110 meq/L (ref 96–112)
CO2: 24 mmol/L (ref 19–32)
CREATININE: 0.53 mg/dL (ref 0.50–1.35)
Calcium: 8.1 mg/dL — ABNORMAL LOW (ref 8.4–10.5)
GFR calc Af Amer: >90 mL/min (ref >90)
GFR calc non Af Amer: >90 mL/min (ref >90)
GLUCOSE: 93 mg/dL (ref 70–99)
Potassium: 3.6 mmol/L (ref 3.5–5.1)
SODIUM: 139 mmol/L (ref 135–145)
TOTAL PROTEIN: 5.6 g/dL — AB (ref 6.0–8.3)
Total Bilirubin: 1.8 mg/dL — ABNORMAL HIGH (ref 0.3–1.2)

## 2014-04-04 LAB — GLUCOSE, CAPILLARY
GLUCOSE-CAPILLARY: 155 mg/dL — AB (ref 70–99)
Glucose-Capillary: 97 mg/dL (ref 70–99)
Glucose-Capillary: 124 mg/dL — ABNORMAL HIGH (ref 70–99)

## 2014-04-04 LAB — MAGNESIUM: Magnesium: 1.6 mg/dL (ref 1.5–2.5)

## 2014-04-04 LAB — AMMONIA: Ammonia: 99 umol/L — ABNORMAL HIGH (ref 11–32)

## 2014-04-04 MED ORDER — SPIRONOLACTONE 25 MG PO TABS: 25.0000 mg | ORAL_TABLET | Freq: Every day | ORAL | Status: DC

## 2014-04-04 MED ORDER — LACTULOSE 10 GM/15ML PO SOLN: 30.0000 g | Freq: Two times a day (BID) | ORAL | Status: DC

## 2014-04-04 MED ORDER — INSULIN GLARGINE 100 UNIT/ML ~~LOC~~ SOLN: 8.0000 [IU] | Freq: Every day | SUBCUTANEOUS | Status: DC

## 2014-04-04 NOTE — Discharge Summary (Signed)
PATIENT DETAILS Name: Ethan Wade Age: 64 y.o. Sex: male Date of Birth: 06/18/1950 MRN: [id number]. Admitting Physician: Ivor Costa, MD IRC:[id number] Nicole Kindred, MD  Admit Date: 04/01/2014 Discharge date: 04/04/2014  Recommendations for Outpatient Follow-up:  1. Held Lasix on discharge. Please assess at next visit-and resume accordingly. 2. Decreased Lantus to 8 units, please reassess at next visit. 3. Check electrolytes in 1-2 weeks.  PRIMARY DISCHARGE DIAGNOSIS:  Active Problems:   Thrombocytopenia   Chronic hepatitis C with cirrhosis   HTN (hypertension)   Prostate enlargement   PVT (portal vein thrombosis)   Hepatic encephalopathy   Liver cancer   Elevated troponin   Chronic liver disease and cirrhosis   Diabetes type 2, controlled   Increased ammonia level      PAST MEDICAL HISTORY: Past Medical History  Diagnosis Date  . Macrocytosis   . Thrombocytopenia   . Esophageal bleed, non-variceal 2006  . HTN (hypertension)   . Psoriasis   . Mitral valve prolapse     OCCAS FLUTTER FEELING  . Vitamin D deficiency   . Umbilical hernia     OCCAS DISCOMFORT  . Prostate enlargement     PT TOLD "NORMAL FOR AGE" - TAKES FINASTERIDE AND FLOMAX  . Heart murmur   . Type II diabetes mellitus   . Rheumatic fever 1950's  . Hepatitis C 1980's  . Arthritis     "joint stiffness in the knees" (11/10/2013)  . Liver cancer     DISCHARGE MEDICATIONS: Current Discharge Medication List    START taking these medications   Details  lactulose (CHRONULAC) 10 GM/15ML solution Take 45 mLs (30 g total) by mouth 2 (two) times daily. Can titrate dose to either twice or thrice daily to achieve 2-3 bowel movements daily Qty: 240 mL, Refills: 0    spironolactone (ALDACTONE) 25 MG tablet Take 1 tablet (25 mg total) by mouth daily. Qty: 30 tablet, Refills: 0      CONTINUE these medications which have CHANGED   Details  insulin glargine (LANTUS) 100 UNIT/ML injection Inject 0.08 mLs  (8 Units total) into the skin daily. Qty: 10 mL, Refills: 11      CONTINUE these medications which have NOT CHANGED   Details  dicyclomine (BENTYL) 10 MG capsule Take 1 capsule (10 mg total) by mouth 2 (two) times daily. Qty: 60 capsule, Refills: 3    finasteride (PROSCAR) 5 MG tablet Take 5 mg by mouth daily.    ibuprofen (ADVIL,MOTRIN) 600 MG tablet Take 600 mg by mouth 3 (three) times daily as needed (pain).    oxyCODONE (OXY IR/ROXICODONE) 5 MG immediate release tablet Take 1 tablet (5 mg total) by mouth every 12 (twelve) hours as needed for severe pain or breakthrough pain (only for severe or breakthrough pain). Qty: 20 tablet, Refills: 0    prochlorperazine (COMPAZINE) 10 MG tablet Take 10 mg by mouth every 6 (six) hours as needed for nausea or vomiting.    SORAfenib (NEXAVAR) 200 MG tablet Take 400 mg by mouth every 12 (twelve) hours. Take on an empty stomach 1 hour before or 2 hours after meals.    Tamsulosin HCl (FLOMAX) 0.4 MG CAPS Take 0.4 mg by mouth daily.    Vitamin D, Ergocalciferol, (DRISDOL) 50000 UNITS CAPS Take 50,000 Units by mouth every Friday.    zolpidem (AMBIEN) 10 MG tablet Take 10 mg by mouth at bedtime.     Carboxymethylcellul-Glycerin (CLEAR EYES FOR DRY EYES) 1-0.25 % SOLN Apply 1-2 drops to eye daily  as needed. For dry eyes    nadolol (CORGARD) 20 MG tablet Take 20 mg by mouth daily.    ondansetron (ZOFRAN ODT) 4 MG disintegrating tablet Take 1 tablet (4 mg total) by mouth every 8 (eight) hours as needed for nausea or vomiting. Qty: 30 tablet, Refills: 0      STOP taking these medications     furosemide (LASIX) 20 MG tablet      polyethylene glycol (MIRALAX / GLYCOLAX) packet      spironolactone-hydrochlorothiazide (ALDACTAZIDE) 25-25 MG per tablet      Docusate Sodium (DSS) 100 MG CAPS      traMADol (ULTRAM) 50 MG tablet         ALLERGIES:  No Known Allergies  BRIEF HPI:  See H&P, Labs, Consult and Test reports for all details in  brief, patient is a 64 y.o. male with a past medical history of hypertension, BPH, diabetes mellitus, hepatitis C, liver cancer, who presented with altered mental status.Work up in the ED demonstrates ammonia 129, negative CT head for acute abnormalities  CONSULTATIONS:   None  PERTINENT RADIOLOGIC STUDIES: Dg Chest 2 View  04/01/2014   CLINICAL DATA:  Altered mental status.  Hypertension.  Diabetes.  EXAM: CHEST  2 VIEW  COMPARISON:  03/04/2012  FINDINGS: Mildly prominent right contour of the ascending thoracic aorta and, possibly due to rightward rotation. Retrocardiac density resembling hiatal hernia is attributable to the patient's known large uphill varices. Heart size within normal limits. The lungs appear clear.  IMPRESSION: 1. Mild prominence of the ascending aortic right margin is probably due to rightward rotation, and less likely to be due to ectasia of the ascending thoracic aorta. 2. No acute findings to explain the patient'  s symptoms.   Electronically Signed   By: Sherryl Barters M.D.   On: 04/01/2014 20:11   Ct Head Wo Contrast  04/01/2014   CLINICAL DATA:  New onset of weakness and disorientation. Altered mental status.  EXAM: CT HEAD WITHOUT CONTRAST  TECHNIQUE: Contiguous axial images were obtained from the base of the skull through the vertex without intravenous contrast.  COMPARISON:  None.  FINDINGS: No acute cortical infarct, hemorrhage, or mass lesion is present. The ventricles are of normal size. No significant extra-axial fluid collection is evident. The paranasal sinuses and mastoid air cells are clear. The calvarium is intact.  IMPRESSION: Negative CT of the head.   Electronically Signed   By: Lawrence Santiago M.D.   On: 04/01/2014 20:39     PERTINENT LAB RESULTS: CBC:  Recent Labs  04/03/14 0439 04/04/14 0414  WBC 4.4 4.1  HGB 13.8 13.7  HCT 37.7* 37.7*  PLT 54* 52*   CMET CMP     Component Value Date/Time   NA 139 04/04/2014 0414   NA 143 04/27/2012 1331    K 3.6 04/04/2014 0414   K 4.3 04/27/2012 1331   CL 110 04/04/2014 0414   CL 105 04/27/2012 1331   CO2 24 04/04/2014 0414   CO2 28 04/27/2012 1331   GLUCOSE 93 04/04/2014 0414   GLUCOSE 248* 04/27/2012 1331   BUN 10 04/04/2014 0414   BUN 8.4 04/27/2012 1331   CREATININE 0.53 04/04/2014 0414   CREATININE 0.8 04/27/2012 1331   CALCIUM 8.1* 04/04/2014 0414   CALCIUM 9.2 04/27/2012 1331   PROT 5.6* 04/04/2014 0414   PROT 6.7 04/27/2012 1331   ALBUMIN 2.5* 04/04/2014 0414   ALBUMIN 3.2* 04/27/2012 1331   AST 44* 04/04/2014 0414  AST 78* 04/27/2012 1331   ALT 28 04/04/2014 0414   ALT 72* 04/27/2012 1331   ALKPHOS 67 04/04/2014 0414   ALKPHOS 108 04/27/2012 1331   BILITOT 1.8* 04/04/2014 0414   BILITOT 3.29* 04/27/2012 1331   GFRNONAA >90 04/04/2014 0414   GFRAA >90 04/04/2014 0414    GFR Estimated Creatinine Clearance: 89.2 mL/min (by C-G formula based on Cr of 0.53).  Recent Labs  04/01/14 1825  LIPASE 36    Recent Labs  04/01/14 2254 04/02/14 0135 04/02/14 0758  TROPONINI <0.03 <0.03 0.03   Invalid input(s): POCBNP No results for input(s): DDIMER in the last 72 hours.  Recent Labs  04/02/14 1017  HGBA1C 6.5*   No results for input(s): CHOL, HDL, LDLCALC, TRIG, CHOLHDL, LDLDIRECT in the last 72 hours. No results for input(s): TSH, T4TOTAL, T3FREE, THYROIDAB in the last 72 hours.  Invalid input(s): FREET3 No results for input(s): VITAMINB12, FOLATE, FERRITIN, TIBC, IRON, RETICCTPCT in the last 72 hours. Coags:  Recent Labs  04/01/14 1825 04/02/14 0758  INR 1.28 1.35   Microbiology: Recent Results (from the past 240 hour(s))  Culture, blood (routine x 2)     Status: None (Preliminary result)   Collection Time: 04/01/14  5:38 PM  Result Value Ref Range Status   Specimen Description BLOOD RIGHT ANTECUBITAL  Final   Special Requests BOTTLES DRAWN AEROBIC ONLY 4CC  Final   Culture   Final           BLOOD CULTURE RECEIVED NO GROWTH TO DATE CULTURE WILL  BE HELD FOR 5 DAYS BEFORE ISSUING A FINAL NEGATIVE REPORT Performed at [facility]    Report Status PENDING  Incomplete  Culture, blood (routine x 2)     Status: None (Preliminary result)   Collection Time: 04/01/14  7:30 PM  Result Value Ref Range Status   Specimen Description BLOOD LEFT FOREARM  Final   Special Requests BOTTLES DRAWN AEROBIC AND ANAEROBIC 4CC EA  Final   Culture   Final           BLOOD CULTURE RECEIVED NO GROWTH TO DATE CULTURE WILL BE HELD FOR 5 DAYS BEFORE ISSUING A FINAL NEGATIVE REPORT Performed at [facility]    Report Status PENDING  Incomplete  MRSA PCR Screening     Status: None   Collection Time: 04/02/14  1:26 AM  Result Value Ref Range Status   MRSA by PCR NEGATIVE NEGATIVE Final    Comment:        The GeneXpert MRSA Assay (FDA approved for NASAL specimens only), is one component of a comprehensive MRSA colonization surveillance program. It is not intended to diagnose MRSA infection nor to guide or monitor treatment for MRSA infections.      BRIEF HOSPITAL COURSE:   Active Problems: Hepatic encephalopathy:mentation significantly improved, suspect he is back to baseline. Could have some component of dehydration contributing to encephalopathy. Started on Lactulose, patient educated about need of having 2-3 BM's daily and about need to titrate dosing of Lactulose to achieve this. He is completely awake and alert at time of discharge. Eating well, and ambulating independently.Ammonia level at discharge 99-but as noted above-patient is completely awake and alert.  Note-I spoke with patient's brother Juanda Crumble over the phone, family will provide much closer supervision of patient.  Chronic hepatitis C with cirrhosis / liver malignancy:Followed by [facility]- total bilirubin initially was greater than 3 and per recommendations preadmission Nexavar was placed on hold -repeat total bilirubin as of 1/6  down to 1.8. Hence resumed Nexava. Since-no  definite ascites, no fever or leukocytosis,Rocephin was  discontinued for empiric SBP on 1/4. Continue Corgard and Aldactone on discharge.   Thrombocytopenia:Chronic and at baseline readings between 60 and 70,000   FMB:[id number] all diuretics were on hold, resume Aldactone on discharge, resume Lasix when patient follow's up with PCP.    Prostate enlargement:Resume Flomax and Proscar.  Diabetes mellitus:Resume Lantus but decrease dose to 8 units, follow up with PCP in 1 week. for now   PVT (portal vein thrombosis):Onset August 2014-not on anticoagulation prior to admission   Elevated troponin:Subsequent troponins have been negative 3 collections. Suspect a poor candidate for further work up.   TODAY-DAY OF DISCHARGE:  Subjective:   Skyelar Swigart today has no headache,no chest abdominal pain,no new weakness tingling or numbness, feels much better wants to go home today.   Objective:   Blood pressure 131/84, pulse 59, temperature 98.5 F (36.9 C), temperature source Oral, resp. rate 17, height 5\' 10"  (1.778 m), weight 68.493 kg (151 lb), SpO2 97 %.  Intake/Output Summary (Last 24 hours) at 04/04/14 1100 Last data filed at 04/04/14 0959  Gross per 24 hour  Intake 1668.83 ml  Output    600 ml  Net 1068.83 ml   Filed Weights   04/02/14 2028 04/03/14 0545 04/04/14 0500  Weight: 67.45 kg (148 lb 11.2 oz) 68.539 kg (151 lb 1.6 oz) 68.493 kg (151 lb)    Exam Awake Alert, Oriented *3, No new F.N deficits, Normal affect [place].AT,PERRAL Supple Neck,No JVD, No cervical lymphadenopathy appriciated.  Symmetrical Chest wall movement, Good air movement bilaterally, CTAB RRR,No Gallops,Rubs or new Murmurs, No Parasternal Heave +ve B.Sounds, Abd Soft, Non tender, No organomegaly appriciated, No rebound -guarding or rigidity. No Cyanosis, Clubbing or edema, No new Rash or bruise  DISCHARGE CONDITION: Stable  DISPOSITION: Home-have ordered HHRN and Aide  DISCHARGE INSTRUCTIONS:      Activity:  As tolerated  Diet recommendation: Diabetic Diet Heart Healthy diet  Discharge Instructions    Call MD for:  extreme fatigue    Complete by:  As directed      Call MD for:  persistant dizziness or light-headedness    Complete by:  As directed      Call MD for:    Complete by:  As directed   confusion     Diet - low sodium heart healthy    Complete by:  As directed      Diet Carb Modified    Complete by:  As directed      Increase activity slowly    Complete by:  As directed            Follow-up Information    Follow up with Leslie Andrea, MD On 04/23/2014.   Specialty:  Internal Medicine   Why:  keep this appt   Contact information:   [place] [address] [phone]       Follow up with Gerrit Heck, MD. Schedule an appointment as soon as possible for a visit in 1 week.   Specialty:  Family Medicine   Contact information:   [place] [address] [phone]       Follow up with HAYES,Knox C, MD. Schedule an appointment as soon as possible for a visit in 2 weeks.   Specialty:  Gastroenterology   Contact information:   [address] [place] [address] [phone]  Total Time spent on discharge equals 45 minutes.  SignedOren Binet 04/04/2014 11:00 AM

## 2014-04-04 NOTE — Progress Notes (Signed)
Physical Therapy Treatment Patient Details Name: Glen Kesinger MRN: [id number] DOB: 09-Aug-1950 Today's Date: 04/04/2014    History of Present Illness pt presents with Hepatic Encephalopathy.      PT Comments    Patient progressing well. Plan is to Dc home today if tolerates diet  Follow Up Recommendations  No PT follow up;Supervision - Intermittent     Equipment Recommendations  None recommended by PT    Recommendations for Other Services       Precautions / Restrictions      Mobility  Bed Mobility Overal bed mobility: Modified Independent                Transfers Overall transfer level: Modified independent                  Ambulation/Gait Ambulation/Gait assistance: Modified independent (Device/Increase time) Ambulation Distance (Feet): 600 Feet Assistive device: None       General Gait Details: no LOB noted. Guarded but WFL   Stairs Stairs: Yes Stairs assistance: Supervision Stair Management: Alternating pattern;One rail Left;Forwards Number of Stairs: 12 General stair comments: Supervision for safety  Wheelchair Mobility    Modified Rankin (Stroke Patients Only)       Balance                                    Cognition Arousal/Alertness: Awake/alert Behavior During Therapy: WFL for tasks assessed/performed Overall Cognitive Status: Within Functional Limits for tasks assessed                      Exercises      General Comments        Pertinent Vitals/Pain Pain Assessment: No/denies pain    Home Living                      Prior Function            PT Goals (current goals can now be found in the care plan section) Progress towards PT goals: Progressing toward goals    Frequency  Min 3X/week    PT Plan Current plan remains appropriate    Co-evaluation             End of Session Equipment Utilized During Treatment: Gait belt Activity Tolerance: Patient tolerated  treatment well Patient left: in chair;with call bell/phone within reach     Time: 0900-0913 PT Time Calculation (min) (ACUTE ONLY): 13 min  Charges:  $Gait Training: 8-22 mins                    G Codes:      Jacqualyn Posey 04/04/2014, 9:26 AM 04/04/2014 Jacqualyn Posey PTA [phone] pager [phone] office

## 2014-04-08 LAB — CULTURE, BLOOD (ROUTINE X 2)
CULTURE: NO GROWTH
CULTURE: NO GROWTH

## 2014-08-16 ENCOUNTER — Encounter (HOSPITAL_COMMUNITY): Payer: Self-pay | Admitting: *Deleted

## 2014-08-16 ENCOUNTER — Inpatient Hospital Stay (HOSPITAL_COMMUNITY)
Admission: EM | Admit: 2014-08-16 | Discharge: 2014-08-18 | DRG: 441 | Disposition: A | Discharge status: Home / Self Care | Payer: Medicare Other | Attending: Internal Medicine | Admitting: Internal Medicine

## 2014-08-16 DIAGNOSIS — C229 Malignant neoplasm of liver, not specified as primary or secondary: Secondary | ICD-10-CM | POA: Diagnosis present

## 2014-08-16 DIAGNOSIS — I341 Nonrheumatic mitral (valve) prolapse: Secondary | ICD-10-CM | POA: Diagnosis present

## 2014-08-16 DIAGNOSIS — Z6823 Body mass index (BMI) 23.0-23.9, adult: Secondary | ICD-10-CM

## 2014-08-16 DIAGNOSIS — E119 Type 2 diabetes mellitus without complications: Secondary | ICD-10-CM | POA: Diagnosis present

## 2014-08-16 DIAGNOSIS — I10 Essential (primary) hypertension: Secondary | ICD-10-CM | POA: Diagnosis present

## 2014-08-16 DIAGNOSIS — E440 Moderate protein-calorie malnutrition: Secondary | ICD-10-CM | POA: Diagnosis present

## 2014-08-16 DIAGNOSIS — D696 Thrombocytopenia, unspecified: Secondary | ICD-10-CM | POA: Diagnosis present

## 2014-08-16 DIAGNOSIS — K228 Other specified diseases of esophagus: Secondary | ICD-10-CM

## 2014-08-16 DIAGNOSIS — K7290 Hepatic failure, unspecified without coma: Principal | ICD-10-CM | POA: Diagnosis present

## 2014-08-16 DIAGNOSIS — M1990 Unspecified osteoarthritis, unspecified site: Secondary | ICD-10-CM | POA: Diagnosis present

## 2014-08-16 DIAGNOSIS — E559 Vitamin D deficiency, unspecified: Secondary | ICD-10-CM | POA: Diagnosis present

## 2014-08-16 DIAGNOSIS — K7460 Unspecified cirrhosis of liver: Secondary | ICD-10-CM | POA: Diagnosis present

## 2014-08-16 DIAGNOSIS — Z87891 Personal history of nicotine dependence: Secondary | ICD-10-CM | POA: Diagnosis not present

## 2014-08-16 DIAGNOSIS — I81 Portal vein thrombosis: Secondary | ICD-10-CM | POA: Diagnosis present

## 2014-08-16 DIAGNOSIS — Z8619 Personal history of other infectious and parasitic diseases: Secondary | ICD-10-CM | POA: Diagnosis not present

## 2014-08-16 DIAGNOSIS — Z794 Long term (current) use of insulin: Secondary | ICD-10-CM | POA: Diagnosis not present

## 2014-08-16 DIAGNOSIS — L409 Psoriasis, unspecified: Secondary | ICD-10-CM | POA: Diagnosis present

## 2014-08-16 DIAGNOSIS — R7989 Other specified abnormal findings of blood chemistry: Secondary | ICD-10-CM

## 2014-08-16 DIAGNOSIS — Z9114 Patient's other noncompliance with medication regimen: Secondary | ICD-10-CM | POA: Diagnosis present

## 2014-08-16 DIAGNOSIS — B182 Chronic viral hepatitis C: Secondary | ICD-10-CM | POA: Diagnosis not present

## 2014-08-16 DIAGNOSIS — C220 Liver cell carcinoma: Secondary | ICD-10-CM | POA: Diagnosis not present

## 2014-08-16 DIAGNOSIS — K769 Liver disease, unspecified: Secondary | ICD-10-CM | POA: Diagnosis not present

## 2014-08-16 DIAGNOSIS — K2289 Other specified disease of esophagus: Secondary | ICD-10-CM

## 2014-08-16 DIAGNOSIS — K7682 Hepatic encephalopathy: Secondary | ICD-10-CM | POA: Diagnosis present

## 2014-08-16 DIAGNOSIS — N400 Enlarged prostate without lower urinary tract symptoms: Secondary | ICD-10-CM | POA: Diagnosis present

## 2014-08-16 DIAGNOSIS — R4182 Altered mental status, unspecified: Secondary | ICD-10-CM | POA: Diagnosis present

## 2014-08-16 LAB — CBC
HEMATOCRIT: 44.3 % (ref 39.0–52.0)
HEMOGLOBIN: 16 g/dL (ref 13.0–17.0)
MCH: 36.4 pg — AB (ref 26.0–34.0)
MCHC: 36.1 g/dL — ABNORMAL HIGH (ref 30.0–36.0)
MCV: 100.9 fL — ABNORMAL HIGH (ref 78.0–100.0)
PLATELETS: 63 10*3/uL — AB (ref 150–400)
RBC: 4.39 MIL/uL (ref 4.22–5.81)
RDW: 14.6 % (ref 11.5–15.5)
WBC: 5.1 10*3/uL (ref 4.0–10.5)

## 2014-08-16 LAB — COMPREHENSIVE METABOLIC PANEL
ALT: 33 U/L (ref 17–63)
AST: 60 U/L — ABNORMAL HIGH (ref 15–41)
Albumin: 2.6 g/dL — ABNORMAL LOW (ref 3.5–5.0)
Alkaline Phosphatase: 74 U/L (ref 38–126)
Anion gap: 7 (ref 5–15)
BUN: 7 mg/dL (ref 6–20)
CALCIUM: 8.1 mg/dL — AB (ref 8.9–10.3)
CHLORIDE: 106 mmol/L (ref 101–111)
CO2: 25 mmol/L (ref 22–32)
Creatinine, Ser: 0.64 mg/dL (ref 0.61–1.24)
Glucose, Bld: 110 mg/dL — ABNORMAL HIGH (ref 65–99)
POTASSIUM: 4.4 mmol/L (ref 3.5–5.1)
SODIUM: 138 mmol/L (ref 135–145)
Total Bilirubin: 4.1 mg/dL — ABNORMAL HIGH (ref 0.3–1.2)
Total Protein: 6.4 g/dL — ABNORMAL LOW (ref 6.5–8.1)

## 2014-08-16 LAB — CBG MONITORING, ED: Glucose-Capillary: 88 mg/dL (ref 65–99)

## 2014-08-16 LAB — AMMONIA: Ammonia: 159 umol/L — ABNORMAL HIGH (ref 9–35)

## 2014-08-16 NOTE — H&P (Signed)
[facility] History and Physical  Ethan Wade JOI:[id number] DOB: 01-09-1951 DOA: 08/16/2014  Referring physician: ED physician PCP: Gerrit Heck, MD  Specialists:   Chief Complaint: AMS  HPI: Ethan Wade is a 64 y.o. male with PMH of hypertension, diabetes mellitus, psoriasis, BPH, history of hepatitis C, liver cancer, history of hepatic encephalopathy, medication noncompliance, who presents with altered mental status.  Patient has altered mental status, history is obtained from his brother, stepdaughter and granddaughter. Per family, patient has not been taking his lactulose in the past 2 or 3 weeks. He has become more and more confused in the past several days. Family also reports that the patient is only taking other medications intermittently. No symptoms of UTI. Has minimal abdominal soreness, no real pain. He has bilaterally leg edema.  Currently patient denies fever, chills, running nose, ear pain, headaches, cough, chest pain, SOB, diarrhea, constipation, dysuria, urgency, frequency, hematuria, skin rashes. No unilateral weakness, numbness or tingling sensations. No vision change or hearing loss.  In ED, patient was found to have ammonia 159, WBC 5.1, temperature normal, bradycardia, electrolytes okay. Patient is admitted to inpatient for further evaluation and treatment.  Where does patient live?   At home    Can patient participate in ADLs?  None     Review of Systems:   General: no fevers, chills, no changes in body weight, has poor appetite, has fatigue HEENT: no blurry vision, hearing changes or sore throat Pulm: no dyspnea, coughing, wheezing CV: no chest pain, palpitations Abd: no nausea, vomiting, abdominal pain, diarrhea, constipation GU: no dysuria, burning on urination, increased urinary frequency, hematuria  Ext: has leg edema Neuro: no unilateral weakness, numbness, or tingling, no vision change or hearing loss Skin: no rash MSK: No muscle  spasm, no deformity, no limitation of range of movement in spin Heme: No easy bruising.  Travel history: No recent long distant travel.  Allergy: No Known Allergies  Past Medical History  Diagnosis Date  . Macrocytosis   . Thrombocytopenia   . Esophageal bleed, non-variceal 2006  . HTN (hypertension)   . Psoriasis   . Mitral valve prolapse     OCCAS FLUTTER FEELING  . Vitamin D deficiency   . Umbilical hernia     OCCAS DISCOMFORT  . Prostate enlargement     PT TOLD "NORMAL FOR AGE" - TAKES FINASTERIDE AND FLOMAX  . Heart murmur   . Type II diabetes mellitus   . Rheumatic fever 1950's  . Hepatitis C 1980's  . Arthritis     "joint stiffness in the knees" (11/10/2013)  . Liver cancer     Past Surgical History  Procedure Laterality Date  . Tonsilectomy, adenoidectomy, bilateral myringotomy and tubes  child  . Umbilical hernia repair  03/09/2012    Procedure: HERNIA REPAIR UMBILICAL ADULT;  Surgeon: Edward Jolly, MD;  Location: [facility];  Service: General;  Laterality: N/A;  Repair Umbilical Hernia with Mesh  . Insertion of mesh  03/09/2012    Procedure: INSERTION OF MESH;  Surgeon: Edward Jolly, MD;  Location: [facility];  Service: General;  Laterality: N/A;  . Inguinal hernia repair Left 1970's  . Hernia repair    . Tonsillectomy      Social History:  reports that he has quit smoking. His smoking use included Cigars. He started smoking about 8 years ago. He has quit using smokeless tobacco. His smokeless tobacco use included Snuff. He reports that he drinks alcohol. He reports that he uses illicit drugs.  Family History:  Family History  Problem Relation Age of Onset  . Prostate cancer Brother   . COPD Mother   . Heart disease Father   . Diabetes Father   . Alcohol abuse Father   . Liver cancer Brother   . Colon cancer Brother   . COPD Maternal Grandfather      Prior to Admission medications   Medication Sig Start Date End Date Taking? Authorizing Provider   Carboxymethylcellul-Glycerin (CLEAR EYES FOR DRY EYES) 1-0.25 % SOLN Apply 1-2 drops to eye daily as needed. For dry eyes   Yes Historical Provider, MD  dicyclomine (BENTYL) 10 MG capsule Take 1 capsule (10 mg total) by mouth 2 (two) times daily. 11/13/13  Yes Ripudeep Krystal Eaton, MD  furosemide (LASIX) 40 MG tablet Take 40 mg by mouth daily.   Yes Historical Provider, MD  ibuprofen (ADVIL,MOTRIN) 600 MG tablet Take 600 mg by mouth 3 (three) times daily as needed (pain).   Yes Historical Provider, MD  insulin glargine (LANTUS) 100 UNIT/ML injection Inject 0.08 mLs (8 Units total) into the skin daily. 04/04/14  Yes Shanker Kristeen Mans, MD  nadolol (CORGARD) 20 MG tablet Take 20 mg by mouth daily.   Yes Historical Provider, MD  oxyCODONE (OXY IR/ROXICODONE) 5 MG immediate release tablet Take 1 tablet (5 mg total) by mouth every 12 (twelve) hours as needed for severe pain or breakthrough pain (only for severe or breakthrough pain). Patient taking differently: Take 5 mg by mouth every 4 (four) hours as needed (pain).  11/13/13  Yes Ripudeep Krystal Eaton, MD  prochlorperazine (COMPAZINE) 10 MG tablet Take 10 mg by mouth every 6 (six) hours as needed for nausea or vomiting.   Yes Historical Provider, MD  SORAfenib (NEXAVAR) 200 MG tablet Take 400 mg by mouth every 12 (twelve) hours. Take on an empty stomach 1 hour before or 2 hours after meals.   Yes Historical Provider, MD  spironolactone (ALDACTONE) 25 MG tablet Take 1 tablet (25 mg total) by mouth daily. 04/04/14  Yes Shanker Kristeen Mans, MD  spironolactone-hydrochlorothiazide (ALDACTAZIDE) 50-50 MG per tablet Take 1 tablet by mouth daily.   Yes Historical Provider, MD  Tamsulosin HCl (FLOMAX) 0.4 MG CAPS Take 0.4 mg by mouth daily.   Yes Historical Provider, MD  Vitamin D, Ergocalciferol, (DRISDOL) 50000 UNITS CAPS Take 50,000 Units by mouth every Friday.   Yes Historical Provider, MD  zolpidem (AMBIEN) 10 MG tablet Take 10 mg by mouth at bedtime.    Yes Historical  Provider, MD  lactulose (CHRONULAC) 10 GM/15ML solution Take 45 mLs (30 g total) by mouth 2 (two) times daily. Can titrate dose to either twice or thrice daily to achieve 2-3 bowel movements daily Patient not taking: Reported on 08/16/2014 04/04/14   Jonetta Osgood, MD  ondansetron (ZOFRAN ODT) 4 MG disintegrating tablet Take 1 tablet (4 mg total) by mouth every 8 (eight) hours as needed for nausea or vomiting. Patient not taking: Reported on 08/16/2014 11/13/13   Ripudeep Krystal Eaton, MD    Physical Exam: Filed Vitals:   08/17/14 0500 08/17/14 0536 08/17/14 0538 08/17/14 0733  BP: 138/86 127/75    Pulse: 52 51    Temp:   97.6 F (36.4 C) 97.6 F (36.4 C)  TempSrc:   Oral Oral  Resp: 12 14    Height:   5\' 11"  (1.803 m)   Weight:   78 kg (171 lb 15.3 oz)   SpO2: 93% 98%  General: Not in acute distress HEENT:       Eyes: PERRL, EOMI, has scleral icterus.       ENT: No discharge from the ears and nose, no pharynx injection, no tonsillar enlargement.        Neck: No JVD, no bruit, no mass felt. Heme: No neck lymph node enlargement. Cardiac: S1/S2, RRR, No murmurs, No gallops or rubs. Pulm: Good air movement bilaterally. No rales, wheezing, rhonchi or rubs. Abd: Soft, nondistended, nontender, no rebound pain, no organomegaly, BS present. Ext: No edema bilaterally. 2+DP/PT pulse bilaterally. Musculoskeletal: No joint deformities, No joint redness or warmth, no limitation of ROM in spin. Skin: No rashes.  Neuro: Alert, not oriented X3, cranial nerves II-XII grossly intact. Moves all extremities. Brachial reflex 2+ bilaterally. Knee reflex 1+ bilaterally. Negative Babinski's sign.  Psych: Patient is not psychotic, no suicidal or hemocidal ideation.  Labs on Admission:  Basic Metabolic Panel:  Recent Labs Lab 08/16/14 1919  NA 138  K 4.4  CL 106  CO2 25  GLUCOSE 110*  BUN 7  CREATININE 0.64  CALCIUM 8.1*   Liver Function Tests:  Recent Labs Lab 08/16/14 1919  AST 60*  ALT  33  ALKPHOS 74  BILITOT 4.1*  PROT 6.4*  ALBUMIN 2.6*   No results for input(s): LIPASE, AMYLASE in the last 168 hours.  Recent Labs Lab 08/16/14 1919  AMMONIA 159*   CBC:  Recent Labs Lab 08/16/14 1919 08/17/14 0704  WBC 5.1 4.2  HGB 16.0 15.0  HCT 44.3 41.9  MCV 100.9* 101.0*  PLT 63* 48*   Cardiac Enzymes: No results for input(s): CKTOTAL, CKMB, CKMBINDEX, TROPONINI in the last 168 hours.  BNP (last 3 results) No results for input(s): BNP in the last 8760 hours.  ProBNP (last 3 results) No results for input(s): PROBNP in the last 8760 hours.  CBG:  Recent Labs Lab 08/16/14 1916  GLUCAP 88    Radiological Exams on Admission: No results found.  EKG:  Not done in ED, will get one.   Assessment/Plan Principal Problem:   Hepatic encephalopathy Active Problems:   Thrombocytopenia   Chronic hepatitis C with cirrhosis   Esophageal bleed, non-variceal   HTN (hypertension)   Mitral valve prolapse   Arthritis   Prostate enlargement   PVT (portal vein thrombosis)   Malnutrition of moderate degree   Liver cancer   Chronic liver disease and cirrhosis   Diabetes type 2, controlled  Hepatic encephalopathy: Patient's altered mental status is most likely caused by hepatic encephalopathy. Ammonia 159 is consistent with this diagnoses. It is most likely due to medication noncompliance. Patient has not taken his lactulose in the past 2 or 3 weeks per family. He does not like to go to bathroom frequently. Patient does not have abdominal pain, less likely to have SBP. No indication for antibiotics now. -will admit to SDU -restart lactulose 30 g tid -Frequent neuro check  Cirrhosis: no signs of SBP. -continue Nadolo, spironolactone lasix for cirrhosis  Hypertension: -On Lasix and spironolactone  BPH: -Continue Flomax  Liver cancer: Diagnosed on 8/20 15. Patient has been followed up by Dr. Lynnette Caffey at [facility]. He is currently taking Nexavar -Continue  home medications -Follow-up with Dr. Lynnette Caffey  DM-II: Last A1c 6.5 on 04/02/14. Well controlled. Patient is taking  Lantus at home -will decrease Lantus dose from 8-5 units daily -SSI  Thrombocytopenia: Likely due to cirrhosis. Platelets 63. No bleeding tendency. -Follow-up of by CBC.   DVT ppx:  SCD  Code Status: Full code Family Communication:   Yes, patient's  family     at bed side Disposition Plan: Admit to inpatient   Date of Service 08/17/2014    Ivor Costa [facility] Pager [phone]  If 7PM-7AM, please contact night-coverage [email or web address] Password [email or web address] 08/17/2014, 7:52 AM

## 2014-08-16 NOTE — ED Provider Notes (Signed)
CSN: [id number]     Arrival date & time 08/16/14  1840 History   First MD Initiated Contact with Patient 08/16/14 2132     Chief Complaint  Patient presents with  . Altered Mental Status      Patient is a 64 y.o. male presenting with altered mental status.  Altered Mental Status Patient with hx of liver CA presents with increasing mental confusion.  Hx had hepatic encephalopathy in the past.  Does not take lactulose on regular basis.  Goes to [facility] to receive care for his liver cancer.    Past Medical History  Diagnosis Date  . Macrocytosis   . Thrombocytopenia   . Esophageal bleed, non-variceal 2006  . HTN (hypertension)   . Psoriasis   . Mitral valve prolapse     OCCAS FLUTTER FEELING  . Vitamin D deficiency   . Umbilical hernia     OCCAS DISCOMFORT  . Prostate enlargement     PT TOLD "NORMAL FOR AGE" - TAKES FINASTERIDE AND FLOMAX  . Heart murmur   . Type II diabetes mellitus   . Rheumatic fever 1950's  . Hepatitis C 1980's  . Arthritis     "joint stiffness in the knees" (11/10/2013)  . Liver cancer    Past Surgical History  Procedure Laterality Date  . Tonsilectomy, adenoidectomy, bilateral myringotomy and tubes  child  . Umbilical hernia repair  03/09/2012    Procedure: HERNIA REPAIR UMBILICAL ADULT;  Surgeon: Edward Jolly, MD;  Location: [facility];  Service: General;  Laterality: N/A;  Repair Umbilical Hernia with Mesh  . Insertion of mesh  03/09/2012    Procedure: INSERTION OF MESH;  Surgeon: Edward Jolly, MD;  Location: [facility];  Service: General;  Laterality: N/A;  . Inguinal hernia repair Left 1970's  . Hernia repair    . Tonsillectomy     Family History  Problem Relation Age of Onset  . Prostate cancer Brother   . COPD Mother   . Heart disease Father   . Diabetes Father   . Alcohol abuse Father   . Liver cancer Brother   . Colon cancer Brother   . COPD Maternal Grandfather    History  Substance Use Topics  . Smoking status: Former Smoker  -- 20 years    Types: Cigars    Start date: 12/22/2005  . Smokeless tobacco: Former Systems developer    Types: Snuff     Comment: "quit smoking ~ 2009; quit snuff in ~ 2013"  . Alcohol Use: Yes     Comment: "quit in late April 2015"    Review of Systems  All other systems reviewed and are negative.     Allergies  Review of patient's allergies indicates no known allergies.  Home Medications   Prior to Admission medications   Medication Sig Start Date End Date Taking? Authorizing Provider  Carboxymethylcellul-Glycerin (CLEAR EYES FOR DRY EYES) 1-0.25 % SOLN Apply 1-2 drops to eye daily as needed. For dry eyes   Yes Historical Provider, MD  dicyclomine (BENTYL) 10 MG capsule Take 1 capsule (10 mg total) by mouth 2 (two) times daily. 11/13/13  Yes Ripudeep Krystal Eaton, MD  furosemide (LASIX) 40 MG tablet Take 40 mg by mouth daily.   Yes Historical Provider, MD  ibuprofen (ADVIL,MOTRIN) 600 MG tablet Take 600 mg by mouth 3 (three) times daily as needed (pain).   Yes Historical Provider, MD  insulin glargine (LANTUS) 100 UNIT/ML injection Inject 0.08 mLs (8 Units total) into the  skin daily. 04/04/14  Yes Shanker Kristeen Mans, MD  nadolol (CORGARD) 20 MG tablet Take 20 mg by mouth daily.   Yes Historical Provider, MD  oxyCODONE (OXY IR/ROXICODONE) 5 MG immediate release tablet Take 1 tablet (5 mg total) by mouth every 12 (twelve) hours as needed for severe pain or breakthrough pain (only for severe or breakthrough pain). Patient taking differently: Take 5 mg by mouth every 4 (four) hours as needed (pain).  11/13/13  Yes Ripudeep Krystal Eaton, MD  prochlorperazine (COMPAZINE) 10 MG tablet Take 10 mg by mouth every 6 (six) hours as needed for nausea or vomiting.   Yes Historical Provider, MD  SORAfenib (NEXAVAR) 200 MG tablet Take 400 mg by mouth every 12 (twelve) hours. Take on an empty stomach 1 hour before or 2 hours after meals.   Yes Historical Provider, MD  spironolactone (ALDACTONE) 25 MG tablet Take 1 tablet (25 mg  total) by mouth daily. 04/04/14  Yes Shanker Kristeen Mans, MD  spironolactone-hydrochlorothiazide (ALDACTAZIDE) 50-50 MG per tablet Take 1 tablet by mouth daily.   Yes Historical Provider, MD  Tamsulosin HCl (FLOMAX) 0.4 MG CAPS Take 0.4 mg by mouth daily.   Yes Historical Provider, MD  Vitamin D, Ergocalciferol, (DRISDOL) 50000 UNITS CAPS Take 50,000 Units by mouth every Friday.   Yes Historical Provider, MD  zolpidem (AMBIEN) 10 MG tablet Take 10 mg by mouth at bedtime.    Yes Historical Provider, MD  lactulose (CHRONULAC) 10 GM/15ML solution Take 45 mLs (30 g total) by mouth 2 (two) times daily. Can titrate dose to either twice or thrice daily to achieve 2-3 bowel movements daily Patient not taking: Reported on 08/16/2014 04/04/14   Jonetta Osgood, MD  ondansetron (ZOFRAN ODT) 4 MG disintegrating tablet Take 1 tablet (4 mg total) by mouth every 8 (eight) hours as needed for nausea or vomiting. Patient not taking: Reported on 08/16/2014 11/13/13   Ripudeep K Rai, MD   BP 171/106 mmHg  Pulse 58  Temp(Src) 98 F (36.7 C) (Oral)  Resp 23  SpO2 94% Physical Exam  Constitutional: He is oriented to person, place, and time. He appears well-developed and well-nourished. No distress.  HENT:  Head: Normocephalic and atraumatic.  Eyes: Pupils are equal, round, and reactive to light. Scleral icterus is present.  Neck: Normal range of motion.  Cardiovascular: Normal rate and intact distal pulses.   Pulmonary/Chest: No respiratory distress.  Abdominal: Normal appearance. He exhibits no distension. There is hepatosplenomegaly.  Musculoskeletal: Normal range of motion.  Neurological: He is alert and oriented to person, place, and time. No cranial nerve deficit.  Skin: Skin is warm and dry. No rash noted.  Psychiatric: He has a normal mood and affect. His behavior is normal.  Nursing note and vitals reviewed.   ED Course  Procedures (including critical care time) Labs Review Labs Reviewed  CBC -  Abnormal; Notable for the following:    MCV 100.9 (*)    MCH 36.4 (*)    MCHC 36.1 (*)    Platelets 63 (*)    All other components within normal limits  COMPREHENSIVE METABOLIC PANEL - Abnormal; Notable for the following:    Glucose, Bld 110 (*)    Calcium 8.1 (*)    Total Protein 6.4 (*)    Albumin 2.6 (*)    AST 60 (*)    Total Bilirubin 4.1 (*)    All other components within normal limits  AMMONIA - Abnormal; Notable for the following:  Ammonia 159 (*)    All other components within normal limits  CBG MONITORING, ED    Imaging Review No results found.    MDM   Final diagnoses:  Hepatic encephalopathy  Increased ammonia level  Malignant neoplasm of liver, unspecified liver malignancy        Leonard Schwartz, MD 08/16/14 [phone]

## 2014-08-16 NOTE — ED Notes (Signed)
MD at bedside. 

## 2014-08-16 NOTE — ED Notes (Signed)
Pt brought here by family for increasingly altered mental status and lethargy x 1 week.  Pt is taking Nexivar for tx of liver CA.

## 2014-08-17 ENCOUNTER — Other Ambulatory Visit (HOSPITAL_COMMUNITY): Payer: Self-pay

## 2014-08-17 DIAGNOSIS — B182 Chronic viral hepatitis C: Secondary | ICD-10-CM

## 2014-08-17 LAB — URINE MICROSCOPIC-ADD ON

## 2014-08-17 LAB — URINALYSIS, ROUTINE W REFLEX MICROSCOPIC
GLUCOSE, UA: NEGATIVE mg/dL
HGB URINE DIPSTICK: NEGATIVE
Ketones, ur: 15 mg/dL — AB
Nitrite: POSITIVE — AB
PH: 6 (ref 5.0–8.0)
PROTEIN: NEGATIVE mg/dL
SPECIFIC GRAVITY, URINE: 1.024 (ref 1.005–1.030)
Urobilinogen, UA: 2 mg/dL — ABNORMAL HIGH (ref 0.0–1.0)

## 2014-08-17 LAB — CBC
HCT: 41.9 % (ref 39.0–52.0)
Hemoglobin: 15 g/dL (ref 13.0–17.0)
MCH: 36.1 pg — ABNORMAL HIGH (ref 26.0–34.0)
MCHC: 35.8 g/dL (ref 30.0–36.0)
MCV: 101 fL — ABNORMAL HIGH (ref 78.0–100.0)
Platelets: 48 10*3/uL — ABNORMAL LOW (ref 150–400)
RBC: 4.15 MIL/uL — AB (ref 4.22–5.81)
RDW: 14.9 % (ref 11.5–15.5)
WBC: 4.2 10*3/uL (ref 4.0–10.5)

## 2014-08-17 LAB — COMPREHENSIVE METABOLIC PANEL
ALT: 29 U/L (ref 17–63)
AST: 49 U/L — ABNORMAL HIGH (ref 15–41)
Albumin: 2.1 g/dL — ABNORMAL LOW (ref 3.5–5.0)
Alkaline Phosphatase: 64 U/L (ref 38–126)
Anion gap: 7 (ref 5–15)
BUN: 8 mg/dL (ref 6–20)
CALCIUM: 8 mg/dL — AB (ref 8.9–10.3)
CO2: 25 mmol/L (ref 22–32)
CREATININE: 0.65 mg/dL (ref 0.61–1.24)
Chloride: 105 mmol/L (ref 101–111)
GFR calc non Af Amer: >60 mL/min (ref >60)
GLUCOSE: 97 mg/dL (ref 65–99)
Potassium: 4 mmol/L (ref 3.5–5.1)
Sodium: 137 mmol/L (ref 135–145)
TOTAL PROTEIN: 5.4 g/dL — AB (ref 6.5–8.1)
Total Bilirubin: 4.1 mg/dL — ABNORMAL HIGH (ref 0.3–1.2)

## 2014-08-17 LAB — RAPID URINE DRUG SCREEN, HOSP PERFORMED
Amphetamines: NOT DETECTED
Barbiturates: NOT DETECTED
Benzodiazepines: NOT DETECTED
COCAINE: NOT DETECTED
Opiates: NOT DETECTED
Tetrahydrocannabinol: NOT DETECTED

## 2014-08-17 LAB — MRSA PCR SCREENING: MRSA BY PCR: NEGATIVE

## 2014-08-17 LAB — GLUCOSE, CAPILLARY
GLUCOSE-CAPILLARY: 90 mg/dL (ref 65–99)
Glucose-Capillary: 86 mg/dL (ref 65–99)
Glucose-Capillary: 235 mg/dL — ABNORMAL HIGH (ref 65–99)
Glucose-Capillary: 251 mg/dL — ABNORMAL HIGH (ref 65–99)

## 2014-08-17 LAB — PROTIME-INR
INR: 1.65 — AB (ref 0.00–1.49)
PROTHROMBIN TIME: 19.6 s — AB (ref 11.6–15.2)

## 2014-08-17 MED ORDER — FUROSEMIDE 40 MG PO TABS
40.0000 mg | ORAL_TABLET | Freq: Every day | ORAL | Status: DC
  Administered 2014-08-17 – 2014-08-18 (×2): 40 mg via ORAL
  Filled 2014-08-17 (×2): qty 1

## 2014-08-17 MED ORDER — SODIUM CHLORIDE 0.9 % IV SOLN: INTRAVENOUS | Status: DC

## 2014-08-17 MED ORDER — POLYVINYL ALCOHOL 1.4 % OP SOLN: 1.0000 [drp] | Freq: Every day | OPHTHALMIC | Status: DC | PRN

## 2014-08-17 MED ORDER — INSULIN ASPART 100 UNIT/ML ~~LOC~~ SOLN
0.0000 [IU] | Freq: Three times a day (TID) | SUBCUTANEOUS | Status: DC
  Administered 2014-08-17: 3 [IU] via SUBCUTANEOUS
  Administered 2014-08-18: 2 [IU] via SUBCUTANEOUS
  Administered 2014-08-18: 3 [IU] via SUBCUTANEOUS

## 2014-08-17 MED ORDER — SPIRONOLACTONE 25 MG PO TABS
25.0000 mg | ORAL_TABLET | Freq: Every day | ORAL | Status: DC
  Administered 2014-08-17 – 2014-08-18 (×2): 25 mg via ORAL
  Filled 2014-08-17 (×3): qty 1

## 2014-08-17 MED ORDER — ENSURE ENLIVE PO LIQD
237.0000 mL | Freq: Two times a day (BID) | ORAL | Status: DC
  Administered 2014-08-17 – 2014-08-18 (×2): 237 mL via ORAL

## 2014-08-17 MED ORDER — SORAFENIB TOSYLATE 200 MG PO TABS: 400.0000 mg | ORAL_TABLET | Freq: Two times a day (BID) | ORAL | Status: DC

## 2014-08-17 MED ORDER — ONDANSETRON HCL 4 MG/2ML IJ SOLN: 4.0000 mg | Freq: Three times a day (TID) | INTRAMUSCULAR | Status: DC | PRN

## 2014-08-17 MED ORDER — INSULIN GLARGINE 100 UNIT/ML ~~LOC~~ SOLN
5.0000 [IU] | Freq: Every day | SUBCUTANEOUS | Status: DC
  Administered 2014-08-18: 5 [IU] via SUBCUTANEOUS
  Filled 2014-08-17 (×2): qty 0.05

## 2014-08-17 MED ORDER — OXYCODONE HCL 5 MG PO TABS: 5.0000 mg | ORAL_TABLET | ORAL | Status: DC | PRN

## 2014-08-17 MED ORDER — LACTULOSE 10 GM/15ML PO SOLN
30.0000 g | Freq: Three times a day (TID) | ORAL | Status: DC
  Administered 2014-08-17 (×3): 30 g via ORAL
  Filled 2014-08-17 (×9): qty 45

## 2014-08-17 MED ORDER — DICYCLOMINE HCL 10 MG PO CAPS
10.0000 mg | ORAL_CAPSULE | Freq: Two times a day (BID) | ORAL | Status: DC
  Administered 2014-08-17 – 2014-08-18 (×3): 10 mg via ORAL
  Filled 2014-08-17 (×4): qty 1

## 2014-08-17 MED ORDER — TAMSULOSIN HCL 0.4 MG PO CAPS
0.4000 mg | ORAL_CAPSULE | Freq: Every day | ORAL | Status: DC
  Administered 2014-08-17 – 2014-08-18 (×2): 0.4 mg via ORAL
  Filled 2014-08-17 (×2): qty 1

## 2014-08-17 MED ORDER — IBUPROFEN 200 MG PO TABS: 600.0000 mg | ORAL_TABLET | Freq: Three times a day (TID) | ORAL | Status: DC | PRN

## 2014-08-17 MED ORDER — ZOLPIDEM TARTRATE 5 MG PO TABS
5.0000 mg | ORAL_TABLET | Freq: Every evening | ORAL | Status: DC | PRN
  Administered 2014-08-17: 5 mg via ORAL
  Filled 2014-08-17: qty 1

## 2014-08-17 MED ORDER — SODIUM CHLORIDE 0.9 % IV SOLN
INTRAVENOUS | Status: DC
  Administered 2014-08-17: 06:00:00 via INTRAVENOUS

## 2014-08-17 MED ORDER — SODIUM CHLORIDE 0.9 % IJ SOLN
3.0000 mL | Freq: Two times a day (BID) | INTRAMUSCULAR | Status: DC
  Administered 2014-08-17 – 2014-08-18 (×3): 3 mL via INTRAVENOUS

## 2014-08-17 MED ORDER — NADOLOL 20 MG PO TABS
20.0000 mg | ORAL_TABLET | Freq: Every day | ORAL | Status: DC
  Administered 2014-08-17 – 2014-08-18 (×2): 20 mg via ORAL
  Filled 2014-08-17 (×3): qty 1

## 2014-08-17 MED ORDER — ONDANSETRON HCL 4 MG PO TABS: 4.0000 mg | ORAL_TABLET | Freq: Four times a day (QID) | ORAL | Status: DC | PRN

## 2014-08-17 MED ORDER — ONDANSETRON HCL 4 MG/2ML IJ SOLN: 4.0000 mg | Freq: Four times a day (QID) | INTRAMUSCULAR | Status: DC | PRN

## 2014-08-17 NOTE — Progress Notes (Signed)
PT Cancellation Note  Patient Details Name: Bolden Hagerman MRN: [id number] DOB: 1950/08/05   Cancelled Treatment:    Reason Eval/Treat Not Completed: Patient not medically ready (Patient remains on bedrest this afternoon, will follow next day)   Duncan Dull 08/17/2014, 1:12 PM Alben Deeds, [place] DPT  [phone]

## 2014-08-17 NOTE — Plan of Care (Signed)
Problem: Phase I Progression Outcomes Goal: Initial discharge plan identified Outcome: Completed/Met Date Met:  08/17/14 Plan to return home with brother

## 2014-08-17 NOTE — Progress Notes (Signed)
Initial Nutrition Assessment  DOCUMENTATION CODES:  Severe malnutrition in context of chronic illness  INTERVENTION:  Ensure Enlive (each supplement provides 350kcal and 20 grams of protein) BID  NUTRITION DIAGNOSIS:  Malnutrition related to chronic illness as evidenced by severe depletion of body fat, severe depletion of muscle mass.   GOAL:  Patient will meet greater than or equal to 90% of their needs   MONITOR:  PO intake, Supplement acceptance, Labs, Weight trends  REASON FOR ASSESSMENT:  Malnutrition Screening Tool    ASSESSMENT:  Pt admitted with hepatic encephalopathy with hx of cirrhosis and liver cancer (dx 11/16/13). Per H&P pt has been noncompliant with his medications at home.   Labs reviewed:  Ammonia 159 (5/19) Medications reviewed and include: lasix, novolog, lantus, spironolactone, and lactulose.   Pt discussed during ICU rounds and with RN.  Pt unable to answer all RD questions reliably. Per pt he has lost weight and has been eating poorly at home but unable to provide specifics.  Diet just advanced and pt seems willing to try ensure.   Nutrition-Focused physical exam completed. Findings are severe fat depletion, severe muscle depletion, and no edema.     Height:  Ht Readings from Last 1 Encounters:  08/17/14 5\' 11"  (1.803 m)    Weight:  Wt Readings from Last 1 Encounters:  08/17/14 171 lb 15.3 oz (78 kg)    Ideal Body Weight:  78.1 kg  Wt Readings from Last 10 Encounters:  08/17/14 171 lb 15.3 oz (78 kg)  04/04/14 151 lb (68.493 kg)  11/12/13 160 lb 8 oz (72.802 kg)  10/06/13 189 lb (85.73 kg)  08/04/12 184 lb 9.6 oz (83.734 kg)  04/27/12 182 lb 3.2 oz (82.645 kg)  12/23/11 190 lb (86.183 kg)  12/20/11 195 lb (88.451 kg)  04/24/11 203 lb 6.4 oz (92.262 kg)  11/14/10 209 lb 6.4 oz (94.983 kg)    BMI:  Body mass index is 23.99 kg/(m^2).  Estimated Nutritional Needs:  Kcal:  2100-2300  Protein:  115-135 grams  Fluid:  2  L/day  Skin:   (contact dermatitis)  Diet Order:  Diet heart healthy/carb modified Room service appropriate?: Yes; Fluid consistency:: Thin  EDUCATION NEEDS:  No education needs identified at this time   Intake/Output Summary (Last 24 hours) at 08/17/14 1150 Last data filed at 08/17/14 0800  Gross per 24 hour  Intake    200 ml  Output    200 ml  Net      0 ml    Last BM:  PTA  Maylon Peppers RD, [place], [place] Pager [phone] After Hours Pager

## 2014-08-17 NOTE — Progress Notes (Addendum)
[facility] PROGRESS NOTE  Ethan Wade OXB:[id number] DOB: 02-Sep-1950 DOA: 08/16/2014 PCP: Gerrit Heck, MD  Assessment/Plan: Hepatic encephalopathy:  -improving, continue lactulose -Ammonia 159  -most likely due to medication noncompliance -he does not have any abdominal pain, less likely to have SBP -repeat ammonia tomorrow, start diet  Cirrhosis: no signs of SBP. -continue Nadolol, spironolactone  And lasix   Hypertension: -On Lasix and spironolactone  BPH: -Continue Flomax  Liver cancer:  -Diagnosed on 8/20 15. Patient has been followed up by Dr. Lynnette Caffey at [facility]. He is currently taking Nexavar, s/p radioembolization, continue nexavar -Follow-up with Dr. Lynnette Caffey  DM-II: Last A1c 6.5 on 04/02/14. Well controlled. Patient is taking Lantus at home -continue decreased Lantus dose of 5 units daily -SSI  Thrombocytopenia: Likely due to cirrhosis. Platelets 63. No bleeding tendency. -Follow-up of by CBC.  DVT ppx: SCD  Code Status: Full Code Family Communication: none at bedside Disposition Plan: transfer to floor    HPI/Subjective: Feels ok, admits to not taking lactulose  Objective: Filed Vitals:   08/17/14 1200  BP: 106/65  Pulse: 50  Temp:   Resp: 14    Intake/Output Summary (Last 24 hours) at 08/17/14 1305 Last data filed at 08/17/14 1200  Gross per 24 hour  Intake    640 ml  Output    350 ml  Net    290 ml   Filed Weights   08/17/14 0538  Weight: 78 kg (171 lb 15.3 oz)    Exam:   General:  AAOx3, no distress  Cardiovascular: S1S2/RRR  Respiratory: CTAB  Abdomen: soft, minimally distended, fluid thrill, BS present  Musculoskeletal: no edema    Neuro: minimal asterixes  Data Reviewed: Basic Metabolic Panel:  Recent Labs Lab 08/16/14 1919 08/17/14 0704  NA 138 137  K 4.4 4.0  CL 106 105  CO2 25 25  GLUCOSE 110* 97  BUN 7 8  CREATININE 0.64 0.65  CALCIUM 8.1* 8.0*   Liver Function  Tests:  Recent Labs Lab 08/16/14 1919 08/17/14 0704  AST 60* 49*  ALT 33 29  ALKPHOS 74 64  BILITOT 4.1* 4.1*  PROT 6.4* 5.4*  ALBUMIN 2.6* 2.1*   No results for input(s): LIPASE, AMYLASE in the last 168 hours.  Recent Labs Lab 08/16/14 1919  AMMONIA 159*   CBC:  Recent Labs Lab 08/16/14 1919 08/17/14 0704  WBC 5.1 4.2  HGB 16.0 15.0  HCT 44.3 41.9  MCV 100.9* 101.0*  PLT 63* 48*   Cardiac Enzymes: No results for input(s): CKTOTAL, CKMB, CKMBINDEX, TROPONINI in the last 168 hours. BNP (last 3 results) No results for input(s): BNP in the last 8760 hours.  ProBNP (last 3 results) No results for input(s): PROBNP in the last 8760 hours.  CBG:  Recent Labs Lab 08/16/14 1916 08/17/14 0830 08/17/14 1151  GLUCAP 88 90 86    Recent Results (from the past 240 hour(s))  MRSA PCR Screening     Status: None   Collection Time: 08/17/14  5:31 AM  Result Value Ref Range Status   MRSA by PCR NEGATIVE NEGATIVE Final    Comment:        The GeneXpert MRSA Assay (FDA approved for NASAL specimens only), is one component of a comprehensive MRSA colonization surveillance program. It is not intended to diagnose MRSA infection nor to guide or monitor treatment for MRSA infections.      Studies: No results found.  Scheduled Meds: . sodium chloride   Intravenous STAT  .  dicyclomine  10 mg Oral BID  . feeding supplement (ENSURE ENLIVE)  237 mL Oral BID BM  . furosemide  40 mg Oral Daily  . insulin aspart  0-9 Units Subcutaneous TID WC  . insulin glargine  5 Units Subcutaneous Daily  . lactulose  30 g Oral TID  . nadolol  20 mg Oral Daily  . sodium chloride  3 mL Intravenous Q12H  . SORAfenib  400 mg Oral Q12H  . spironolactone  25 mg Oral Daily  . tamsulosin  0.4 mg Oral Daily   Continuous Infusions:  Antibiotics Given (last 72 hours)    None      Principal Problem:   Hepatic encephalopathy Active Problems:   Thrombocytopenia   Chronic hepatitis C  with cirrhosis   Esophageal bleed, non-variceal   HTN (hypertension)   Mitral valve prolapse   Arthritis   Prostate enlargement   PVT (portal vein thrombosis)   Malnutrition of moderate degree   Liver cancer   Chronic liver disease and cirrhosis   Diabetes type 2, controlled    Time spent: 70min    Leighla Chestnutt  [facility] Pager [phone]. If 7PM-7AM, please contact night-coverage at [email or web address], password [facility] 08/17/2014, 1:05 PM  LOS: 1 day

## 2014-08-17 NOTE — Progress Notes (Signed)
Ethan Wade is a 64 y.o. male patient who transferred  from 34M awake, alert  & orientated  X 3, Full Code, VSS - Blood pressure 105/72, pulse 57, temperature 97.6 F (36.4 C), temperature source Oral, resp. rate 18, height 5\' 10"  (1.778 m), weight 81.1 kg (178 lb 12.7 oz), SpO2 98 %., no c/o shortness of breath, no c/o chest pain, no distress noted. Non Tele.   IV site WDL: forearm left, condition patent and no redness with a transparent dsg that's clean dry and intact.  Allergies:  No Known Allergies   Past Medical History  Diagnosis Date  . Macrocytosis   . Thrombocytopenia   . Esophageal bleed, non-variceal 2006  . HTN (hypertension)   . Psoriasis   . Mitral valve prolapse     OCCAS FLUTTER FEELING  . Vitamin D deficiency   . Umbilical hernia     OCCAS DISCOMFORT  . Prostate enlargement     PT TOLD "NORMAL FOR AGE" - TAKES FINASTERIDE AND FLOMAX  . Heart murmur   . Type II diabetes mellitus   . Rheumatic fever 1950's  . Hepatitis C 1980's  . Arthritis     "joint stiffness in the knees" (11/10/2013)  . Liver cancer     Pt orientation to unit, room and routine. SR up x 2, fall risk assessment complete with Patient and family verbalizing understanding of risks associated with falls. Pt verbalizes an understanding of how to use the call bell and to call for help before getting out of bed.  Skin, clean-dry- intact with evidence of bruising, & skin tear to right FA, Stage 1 to sacrum & perineum are    Will cont to monitor and assist as needed.  Cadon, Raczka, RN 08/17/2014 7:44 PM

## 2014-08-17 NOTE — Progress Notes (Signed)
PT Cancellation Note  Patient Details Name: Yamin Swingler MRN: [id number] DOB: [birth year]   Cancelled Treatment:    Reason Eval/Treat Not Completed: Patient not medically ready (Active bedrest orders at this time.)   Duncan Dull 08/17/2014, 7:42 AM Alben Deeds, PT DPT  [phone]

## 2014-08-17 NOTE — ED Notes (Signed)
Paged Dr. Donna Bernard to inquire about possibility of patient going to a telemetry bed instead of step-down.  Dr. Donna Bernard insists that patient is appropriate for step-down and cannot go to telemetry at this time.

## 2014-08-17 NOTE — Progress Notes (Signed)
UR completed.  Await medical stability for therapy evaluations to help determine next level of care needed.    Lionel December, RN, BSN Phone [phone]

## 2014-08-18 DIAGNOSIS — K7460 Unspecified cirrhosis of liver: Secondary | ICD-10-CM

## 2014-08-18 DIAGNOSIS — K769 Liver disease, unspecified: Secondary | ICD-10-CM

## 2014-08-18 LAB — COMPREHENSIVE METABOLIC PANEL
ALBUMIN: 2 g/dL — AB (ref 3.5–5.0)
ALT: 27 U/L (ref 17–63)
ANION GAP: 7 (ref 5–15)
AST: 43 U/L — AB (ref 15–41)
Alkaline Phosphatase: 61 U/L (ref 38–126)
BUN: 8 mg/dL (ref 6–20)
CHLORIDE: 103 mmol/L (ref 101–111)
CO2: 27 mmol/L (ref 22–32)
Calcium: 7.9 mg/dL — ABNORMAL LOW (ref 8.9–10.3)
Creatinine, Ser: 0.63 mg/dL (ref 0.61–1.24)
GFR calc Af Amer: >60 mL/min (ref >60)
GFR calc non Af Amer: >60 mL/min (ref >60)
GLUCOSE: 183 mg/dL — AB (ref 65–99)
Potassium: 3.7 mmol/L (ref 3.5–5.1)
Sodium: 137 mmol/L (ref 135–145)
TOTAL PROTEIN: 5.5 g/dL — AB (ref 6.5–8.1)
Total Bilirubin: 3.5 mg/dL — ABNORMAL HIGH (ref 0.3–1.2)

## 2014-08-18 LAB — AMMONIA: Ammonia: 38 umol/L — ABNORMAL HIGH (ref 9–35)

## 2014-08-18 LAB — CBC
HEMATOCRIT: 38.9 % — AB (ref 39.0–52.0)
Hemoglobin: 14 g/dL (ref 13.0–17.0)
MCH: 36.6 pg — ABNORMAL HIGH (ref 26.0–34.0)
MCHC: 36 g/dL (ref 30.0–36.0)
MCV: 101.6 fL — ABNORMAL HIGH (ref 78.0–100.0)
Platelets: 53 10*3/uL — ABNORMAL LOW (ref 150–400)
RBC: 3.83 MIL/uL — AB (ref 4.22–5.81)
RDW: 15 % (ref 11.5–15.5)
WBC: 4.2 10*3/uL (ref 4.0–10.5)

## 2014-08-18 LAB — GLUCOSE, CAPILLARY
GLUCOSE-CAPILLARY: 180 mg/dL — AB (ref 65–99)
GLUCOSE-CAPILLARY: 219 mg/dL — AB (ref 65–99)

## 2014-08-18 MED ORDER — LACTULOSE 10 GM/15ML PO SOLN: 20.0000 g | Freq: Two times a day (BID) | ORAL | Status: DC

## 2014-08-18 MED ORDER — LACTULOSE 10 GM/15ML PO SOLN
20.0000 g | Freq: Two times a day (BID) | ORAL | Status: DC
  Administered 2014-08-18: 20 g via ORAL
  Filled 2014-08-18 (×2): qty 30

## 2014-08-18 NOTE — Progress Notes (Signed)
Patient discharge teaching given, including activity, diet, follow-up appoints, and medications. Patient verbalized understanding of all discharge instructions. IV access was d/c'd. Vitals are stable. Skin is intact except as charted in most recent assessments. Pt to be escorted out by NT, to be driven home by family. 

## 2014-08-18 NOTE — Evaluation (Signed)
Physical Therapy Evaluation Patient Details Name: Ethan Wade MRN: [id number] DOB: [birth year] Today's Date: 08/18/2014   History of Present Illness  Ethan Wade is a 64 y.o. male with PMH of hypertension, diabetes mellitus, psoriasis, BPH, history of hepatitis C, liver cancer, history of hepatic encephalopathy, medication noncompliance, who presents with altered mental status.  Clinical Impression  Pt admitted with above diagnosis. Pt currently with functional limitations due to the deficits listed below (see PT Problem List). At the time of PT eval pt was able to perform transfers and ambulation with mod I to supervision for safety. Pt reports he has been having difficulty at home due to a hernia, however has been managing fairly well at home alone (with slip on shoes and sponge baths). Pt declines the need for continued therapy at d/c, however will continue to see inpatient. Pt will benefit from skilled PT to increase their independence and safety with mobility to allow discharge to the venue listed below.    Follow Up Recommendations No PT follow up    Equipment Recommendations  None recommended by PT    Recommendations for Other Services       Precautions / Restrictions Precautions Precautions: Fall Restrictions Weight Bearing Restrictions: No      Mobility  Bed Mobility Overal bed mobility: Modified Independent                Transfers Overall transfer level: Modified independent Equipment used: None                Ambulation/Gait Ambulation/Gait assistance: Supervision Ambulation Distance (Feet): 300 Feet Assistive device: Rolling walker (2 wheeled) Gait Pattern/deviations: WFL(Within Functional Limits) Gait velocity: Decreased Gait velocity interpretation: Below normal speed for age/gender General Gait Details: Pt typically uses a cane at home, however during gait training today used the RW simply because we did not have a cane readily available.    Stairs Stairs: Yes Stairs assistance: Supervision Stair Management: Two rails;Alternating pattern;Forwards Number of Stairs: 9 General stair comments: No physical assist required.   Wheelchair Mobility    Modified Rankin (Stroke Patients Only)       Balance Overall balance assessment: Needs assistance Sitting-balance support: Feet supported;No upper extremity supported Sitting balance-Leahy Scale: Fair     Standing balance support: Bilateral upper extremity supported;During functional activity Standing balance-Leahy Scale: Fair                               Pertinent Vitals/Pain Pain Assessment: No/denies pain    Home Living Family/patient expects to be discharged to:: Private residence Living Arrangements: Alone Available Help at Discharge: Family;Available PRN/intermittently Type of Home: House Home Access: Stairs to enter Entrance Stairs-Rails: Right;Left;Can reach both Entrance Stairs-Number of Steps: 3 Home Layout: One level Home Equipment: Walker - 2 wheels;Cane - single point Additional Comments: Patient has been sponge bathing recently due to not being able to get in shower.    Prior Function Level of Independence: Independent         Comments: pt notes using a SPC if he's not feeling well.       Hand Dominance   Dominant Hand: Right    Extremity/Trunk Assessment   Upper Extremity Assessment: Defer to OT evaluation;Overall WFL for tasks assessed           Lower Extremity Assessment: Overall WFL for tasks assessed      Cervical / Trunk Assessment: Other exceptions  Communication   Communication: No  difficulties  Cognition Arousal/Alertness: Awake/alert Behavior During Therapy: WFL for tasks assessed/performed Overall Cognitive Status: Within Functional Limits for tasks assessed                      General Comments General comments (skin integrity, edema, etc.): Patient reports he has a hernia and will have a  surgical appointment next month.    Exercises        Assessment/Plan    PT Assessment Patient needs continued PT services  PT Diagnosis Difficulty walking   PT Problem List Decreased strength;Decreased range of motion;Decreased activity tolerance;Decreased balance;Decreased mobility;Decreased knowledge of use of DME;Decreased safety awareness;Decreased knowledge of precautions  PT Treatment Interventions DME instruction;Gait training;Stair training;Functional mobility training;Therapeutic activities;Therapeutic exercise;Neuromuscular re-education;Patient/family education   PT Goals (Current goals can be found in the Care Plan section) Acute Rehab PT Goals Patient Stated Goal: to go home today PT Goal Formulation: With patient Time For Goal Achievement: 08/25/14 Potential to Achieve Goals: Good    Frequency Min 3X/week   Barriers to discharge Decreased caregiver support Pt lives alone    Co-evaluation               End of Session Equipment Utilized During Treatment: Gait belt Activity Tolerance: Patient tolerated treatment well Patient left: in chair;with call bell/phone within reach Nurse Communication: Mobility status         Time: 0300-9233 PT Time Calculation (min) (ACUTE ONLY): 16 min   Charges:   PT Evaluation $Initial PT Evaluation Tier I: 1 Procedure     PT G Codes:        Rolinda Roan 05-Sep-2014, 1:21 PM   Rolinda Roan, PT, DPT Acute Rehabilitation Services Pager: [phone]

## 2014-08-18 NOTE — Progress Notes (Signed)
Occupational Therapy Evaluation Patient Details Name: Ethan Wade MRN: [id number] DOB: 10/17/1950 Today's Date: 08/18/2014    History of Present Illness Curran Lenderman is a 64 y.o. male with PMH of hypertension, diabetes mellitus, psoriasis, BPH, history of hepatitis C, liver cancer, history of hepatic encephalopathy, medication noncompliance, who presents with altered mental status.   Clinical Impression   Patient presents to OT at baseline with ADLs. Recommend shower chair to facilitate showering at home. No further OT needs; will sign off.    Follow Up Recommendations  No OT follow up    Equipment Recommendations  Tub/shower seat    Recommendations for Other Services       Precautions / Restrictions Precautions Precautions: None Restrictions Weight Bearing Restrictions: No      Mobility Bed Mobility                  Transfers                      Balance                                            ADL Overall ADL's : Modified independent                                       General ADL Comments: PT informed this writer that patient was having difficulty donning socks/shoes at baseline. On OT evaluation, patient was able to cross each foot over opposite knee and don/doff socks without difficulty. Instructed patient on AE available for LB dressing should that become a problem, including reacher/sock aid/shoe horn/long sponge, and where to purchase (gift shop vs medical supply store). Also discussed need for shower chair. Will order for patient. Case manager called and informed. Nurse also informed. Patient has no other concerns regarding discharge home.      Vision     Perception     Praxis      Pertinent Vitals/Pain Pain Assessment: No/denies pain     Hand Dominance Right   Extremity/Trunk Assessment Upper Extremity Assessment Upper Extremity Assessment: Overall WFL for tasks assessed   Lower  Extremity Assessment Lower Extremity Assessment: Defer to PT evaluation       Communication Communication Communication: No difficulties   Cognition Arousal/Alertness: Awake/alert Behavior During Therapy: WFL for tasks assessed/performed Overall Cognitive Status: Within Functional Limits for tasks assessed                     General Comments       Exercises       Shoulder Instructions      Home Living Family/patient expects to be discharged to:: Private residence Living Arrangements: Alone Available Help at Discharge: Family;Available PRN/intermittently Type of Home: House Home Access: Stairs to enter [employer] of Steps: 3   Home Layout: One level     Bathroom Shower/Tub: Tub/shower unit Shower/tub characteristics: Curtain Biochemist, clinical: Standard     Home Equipment: Environmental consultant - 2 wheels;Cane - single point   Additional Comments: Patient has been sponge bathing recently due to not being able to get in shower.      Prior Functioning/Environment Level of Independence: Independent        Comments: pt notes using a SPC if he's not feeling well.  OT Diagnosis: Generalized weakness   OT Problem List:     OT Treatment/Interventions:      OT Goals(Current goals can be found in the care plan section) Acute Rehab OT Goals Patient Stated Goal: to go home today  OT Frequency:     Barriers to D/C:            Co-evaluation              End of Session Nurse Communication: Other (comment) (recommending shower chair/please place order)  Activity Tolerance: Patient tolerated treatment well Patient left: in chair;with call bell/phone within reach   Time: 7124-5809 OT Time Calculation (min): 14 min Charges:  OT General Charges $OT Visit: 1 Procedure OT Evaluation $Initial OT Evaluation Tier I: 1 Procedure G-Codes:    Ifeoluwa Bartz A 2014-09-09, 12:15 PM

## 2014-08-18 NOTE — Care Management Note (Signed)
Case Management Note  Patient Details  Name: Ethan Wade MRN: [id number] Date of Birth: [birth year]  Subjective/Objective:                   altered mental status Action/Plan: Discharge planning  Expected Discharge Date:   08/18/14              Expected Discharge Plan:  Home/Self Care  In-House Referral:     Discharge planning Services  CM Consult  Post Acute Care Choice:    Choice offered to:  NA  DME Arranged:  Shower stool DME Agency:  [place]:    [facility] Agency:     Status of Service:  Completed, signed off  Medicare Important Message Given:    Date Medicare IM Given:    Medicare IM give by:    Date Additional Medicare IM Given:    Additional Medicare Important Message give by:     If discussed at [place] of Stay Meetings, dates discussed:    Additional Comments: CM received call from RN and OT requesting shower stool. CM called [facility] DME rep, Jeneen Rinks to please deliver to room so pt can be discharged.  No other CM needs were communicated. Dellie Catholic, RN 08/18/2014, 1:14 PM

## 2014-08-19 NOTE — Progress Notes (Signed)
Physician Discharge Summary  Ethan Wade VCB:[id number] DOB: Sep 01, 1950 DOA: 08/16/2014  PCP: Gerrit Heck, MD  Admit date: 08/16/2014 Discharge date: 08/18/2014  Time spent: 45 minutes  Recommendations for Outpatient Follow-up:  1. Fu with Oncology Dr.Morris at [facility]  Discharge Diagnoses:  Principal Problem:   Hepatic encephalopathy Active Problems:   Thrombocytopenia   Chronic hepatitis C with cirrhosis   Esophageal bleed, non-variceal   HTN (hypertension)   Mitral valve prolapse   Arthritis   Prostate enlargement   PVT (portal vein thrombosis)   Malnutrition of moderate degree   Liver cancer   Chronic liver disease and cirrhosis   Diabetes type 2, controlled   Discharge Condition:stable  Diet recommendation: low sodium, heart healthy  Filed Weights   08/17/14 0538 08/17/14 1427  Weight: 78 kg (171 lb 15.3 oz) 81.1 kg (178 lb 12.7 oz)    History of present illness:  Chief Complaint: confused, sleepy HPI: Ethan Wade is a 64 y.o. male with PMH of hypertension, diabetes mellitus, psoriasis, BPH, history of hepatitis C, liver cancer, history of hepatic encephalopathy, medication noncompliance, presented to ER with altered mental status. History iwas obtained from his brother, stepdaughter and granddaughter. Per family, patient had not been taking his lactulose in the past 2 or 3 weeks. He has become more and more confused in the past several days. No symptoms of UTI. No abd pain. He has bilaterally leg edema.   Hospital Course:  Hepatic encephalopathy:  -improved with resuming lactulose -Ammonia 159 on admission and 38 with significantly improved mentation at discharge -most likely due to medication noncompliance -he does not have any abdominal pain, less likely to have SBP -advised to FU with PCP  Cirrhosis: no signs of SBP. -continue Nadolol, spironolactone And lasix   Hypertension: -On Lasix and spironolactone  BPH: -Continue Flomax  Liver  cancer:  -Diagnosed on 8/20 15. Patient has been followed up by Dr. Lynnette Caffey at [facility]. He is currently taking Nexavar, s/p radioembolization, continue nexavar -Follow-up with Dr. Lynnette Caffey  DM-II: Last A1c 6.5 on 04/02/14. Well controlled. 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 Platelets 63 -chronic, stable   Discharge Exam: Filed Vitals:   08/18/14 1321  BP: 108/69  Pulse: 69  Temp: 98.3 F (36.8 C)  Resp: 18    General: AAOx3 Cardiovascular: S1S2/RRR Respiratory: CTAB  Discharge Instructions   Discharge Instructions    Diet - low sodium heart healthy    Complete by:  As directed      Increase activity slowly    Complete by:  As directed           Discharge Medication List as of 08/18/2014 12:10 PM    CONTINUE these medications which have CHANGED   Details  lactulose (CHRONULAC) 10 GM/15ML solution Take 30 mLs (20 g total) by mouth 2 (two) times daily. Titrate for 2-3soft stools/day, Starting 08/18/2014, Until Discontinued, Print      CONTINUE these medications which have NOT CHANGED   Details  Carboxymethylcellul-Glycerin (CLEAR EYES FOR DRY EYES) 1-0.25 % SOLN Apply 1-2 drops to eye daily as needed. For dry eyes, Until Discontinued, Historical Med    dicyclomine (BENTYL) 10 MG capsule Take 1 capsule (10 mg total) by mouth 2 (two) times daily., Starting 11/13/2013, Until Discontinued, Print    furosemide (LASIX) 40 MG tablet Take 40 mg by mouth daily., Until Discontinued, Historical Med    insulin glargine (LANTUS) 100 UNIT/ML injection Inject 0.08 mLs (8 Units  total) into the skin daily., Starting 04/04/2014, Until Discontinued, No Print    nadolol (CORGARD) 20 MG tablet Take 20 mg by mouth daily., Until Discontinued, Historical Med    oxyCODONE (OXY IR/ROXICODONE) 5 MG immediate release tablet Take 1 tablet (5 mg total) by mouth every 12 (twelve) hours as needed for severe pain or breakthrough pain (only for  severe or breakthrough pain)., Starting 11/13/2013, Until Discontinued, Print    prochlorperazine (COMPAZINE) 10 MG tablet Take 10 mg by mouth every 6 (six) hours as needed for nausea or vomiting., Until Discontinued, Historical Med    SORAfenib (NEXAVAR) 200 MG tablet Take 400 mg by mouth every 12 (twelve) hours. Take on an empty stomach 1 hour before or 2 hours after meals., Until Discontinued, Historical Med    spironolactone (ALDACTONE) 25 MG tablet Take 1 tablet (25 mg total) by mouth daily., Starting 04/04/2014, Until Discontinued, Print    Tamsulosin HCl (FLOMAX) 0.4 MG CAPS Take 0.4 mg by mouth daily., Until Discontinued, Historical Med    Vitamin D, Ergocalciferol, (DRISDOL) 50000 UNITS CAPS Take 50,000 Units by mouth every Friday., Until Discontinued, Historical Med    zolpidem (AMBIEN) 10 MG tablet Take 10 mg by mouth at bedtime. , Until Discontinued, Historical Med    ondansetron (ZOFRAN ODT) 4 MG disintegrating tablet Take 1 tablet (4 mg total) by mouth every 8 (eight) hours as needed for nausea or vomiting., Starting 11/13/2013, Until Discontinued, Print      STOP taking these medications     ibuprofen (ADVIL,MOTRIN) 600 MG tablet      spironolactone-hydrochlorothiazide (ALDACTAZIDE) 50-50 MG per tablet        No Known Allergies Follow-up Information    Follow up with Gerrit Heck, MD. Schedule an appointment as soon as possible for a visit in 1 week.   Specialty:  Family Medicine   Contact information:   [place] [address] [phone]        The results of significant diagnostics from this hospitalization (including imaging, microbiology, ancillary and laboratory) are listed below for reference.    Significant Diagnostic Studies: No results found.  Microbiology: Recent Results (from the past 240 hour(s))  Culture, blood (routine x 2)     Status: None (Preliminary result)   Collection Time: 08/17/14 12:45 AM  Result Value Ref  Range Status   Specimen Description BLOOD LEFT ARM  Final   Special Requests BOTTLES DRAWN AEROBIC ONLY 10 CC  Final   Culture   Final           BLOOD CULTURE RECEIVED NO GROWTH TO DATE CULTURE WILL BE HELD FOR 5 DAYS BEFORE ISSUING A FINAL NEGATIVE REPORT Note: Culture results may be compromised due to an excessive volume of blood received in culture bottles. Performed at [facility]    Report Status PENDING  Incomplete  Culture, blood (routine x 2)     Status: None (Preliminary result)   Collection Time: 08/17/14 12:45 AM  Result Value Ref Range Status   Specimen Description BLOOD RIGHT ARM  Final   Special Requests BOTTLES DRAWN AEROBIC AND ANAEROBIC 10 CC  Final   Culture   Final           BLOOD CULTURE RECEIVED NO GROWTH TO DATE CULTURE WILL BE HELD FOR 5 DAYS BEFORE ISSUING A FINAL NEGATIVE REPORT Note: Culture results may be compromised due to an excessive volume of blood received in culture bottles. Performed at [facility]  Report Status PENDING  Incomplete  MRSA PCR Screening     Status: None   Collection Time: 08/17/14  5:31 AM  Result Value Ref Range Status   MRSA by PCR NEGATIVE NEGATIVE Final    Comment:        The GeneXpert MRSA Assay (FDA approved for NASAL specimens only), is one component of a comprehensive MRSA colonization surveillance program. It is not intended to diagnose MRSA infection nor to guide or monitor treatment for MRSA infections.      Labs: Basic Metabolic Panel:  Recent Labs Lab 08/16/14 1919 08/17/14 0704 08/18/14 0659  NA 138 137 137  K 4.4 4.0 3.7  CL 106 105 103  CO2 25 25 27   GLUCOSE 110* 97 183*  BUN 7 8 8   CREATININE 0.64 0.65 0.63  CALCIUM 8.1* 8.0* 7.9*   Liver Function Tests:  Recent Labs Lab 08/16/14 1919 08/17/14 0704 08/18/14 0659  AST 60* 49* 43*  ALT 33 29 27  ALKPHOS 74 64 61  BILITOT 4.1* 4.1* 3.5*  PROT 6.4* 5.4* 5.5*  ALBUMIN 2.6* 2.1* 2.0*   No results for input(s): LIPASE,  AMYLASE in the last 168 hours.  Recent Labs Lab 08/16/14 1919 08/18/14 0659  AMMONIA 159* 38*   CBC:  Recent Labs Lab 08/16/14 1919 08/17/14 0704 08/18/14 0659  WBC 5.1 4.2 4.2  HGB 16.0 15.0 14.0  HCT 44.3 41.9 38.9*  MCV 100.9* 101.0* 101.6*  PLT 63* 48* 53*   Cardiac Enzymes: No results for input(s): CKTOTAL, CKMB, CKMBINDEX, TROPONINI in the last 168 hours. BNP: BNP (last 3 results) No results for input(s): BNP in the last 8760 hours.  ProBNP (last 3 results) No results for input(s): PROBNP in the last 8760 hours.  CBG:  Recent Labs Lab 08/17/14 1151 08/17/14 1726 08/17/14 2058 08/18/14 0752 08/18/14 1155  GLUCAP 86 235* 251* 180* 219*       Signed:  Karyn Brull  [facility] 08/19/2014, 12:39 PM

## 2014-08-20 NOTE — Discharge Summary (Signed)
Expand All Collapse All   Physician Discharge Summary  Ethan Wade JSH:[id number] DOB: [birth year] DOA: 08/16/2014  PCP: Gerrit Heck, MD  Admit date: 08/16/2014 Discharge date: 08/18/2014  Time spent: 45 minutes  Recommendations for Outpatient Follow-up:  1. Fu with Oncology Dr.Morris at [facility]  Discharge Diagnoses:  Principal Problem:  Hepatic encephalopathy Active Problems:  Thrombocytopenia  Chronic hepatitis C with cirrhosis  Esophageal bleed, non-variceal  HTN (hypertension)  Mitral valve prolapse  Arthritis  Prostate enlargement  PVT (portal vein thrombosis)  Malnutrition of moderate degree  Liver cancer  Chronic liver disease and cirrhosis  Diabetes type 2, controlled   Discharge Condition:stable  Diet recommendation: low sodium, heart healthy  Filed Weights   08/17/14 0538 08/17/14 1427  Weight: 78 kg (171 lb 15.3 oz) 81.1 kg (178 lb 12.7 oz)    History of present illness:  Chief Complaint: confused, sleepy HPI: Ethan Wade is a 64 y.o. male with PMH of hypertension, diabetes mellitus, psoriasis, BPH, history of hepatitis C, liver cancer, history of hepatic encephalopathy, medication noncompliance, presented to ER with altered mental status. History iwas obtained from his brother, stepdaughter and granddaughter. Per family, patient had not been taking his lactulose in the past 2 or 3 weeks. He has become more and more confused in the past several days. No symptoms of UTI. No abd pain.   Hospital Course:  Hepatic encephalopathy:  -improved with resuming lactulose -Ammonia 159 on admission and 38 with significantly improved mentation at discharge -most likely due to medication noncompliance -he does not have any abdominal pain, less likely to have SBP -advised to FU with PCP  Cirrhosis: no signs of SBP. -continue Nadolol, spironolactone And lasix   Hypertension: -On Lasix and spironolactone  BPH: -Continue  Flomax  Liver cancer:  -Diagnosed on 8/20 15. Patient has been followed up by Dr. Lynnette Caffey at [facility]. He is currently taking Nexavar, s/p radioembolization, continue nexavar -Follow-up with Dr. Lynnette Caffey  DM-II: Last A1c 6.5 on 04/02/14. Well controlled. 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 Platelets 63 -chronic, stable   Discharge Exam: Filed Vitals:   08/18/14 1321  BP: 108/69  Pulse: 69  Temp: 98.3 F (36.8 C)  Resp: 18    General: AAOx3 Cardiovascular: S1S2/RRR Respiratory: CTAB  Discharge Instructions   Discharge Instructions    Diet - low sodium heart healthy  Complete by: As directed      Increase activity slowly  Complete by: As directed           Discharge Medication List as of 08/18/2014 12:10 PM    CONTINUE these medications which have CHANGED   Details  lactulose (CHRONULAC) 10 GM/15ML solution Take 30 mLs (20 g total) by mouth 2 (two) times daily. Titrate for 2-3soft stools/day, Starting 08/18/2014, Until Discontinued, Print      CONTINUE these medications which have NOT CHANGED   Details  Carboxymethylcellul-Glycerin (CLEAR EYES FOR DRY EYES) 1-0.25 % SOLN Apply 1-2 drops to eye daily as needed. For dry eyes, Until Discontinued, Historical Med    dicyclomine (BENTYL) 10 MG capsule Take 1 capsule (10 mg total) by mouth 2 (two) times daily., Starting 11/13/2013, Until Discontinued, Print    furosemide (LASIX) 40 MG tablet Take 40 mg by mouth daily., Until Discontinued, Historical Med    insulin glargine (LANTUS) 100 UNIT/ML injection Inject 0.08 mLs (8 Units total) into the skin daily., Starting 04/04/2014, Until Discontinued, No Print    nadolol (CORGARD) 20 MG  tablet Take 20 mg by mouth daily., Until Discontinued, Historical Med    oxyCODONE (OXY IR/ROXICODONE) 5 MG immediate release tablet Take 1 tablet (5 mg total) by mouth every 12  (twelve) hours as needed for severe pain or breakthrough pain (only for severe or breakthrough pain)., Starting 11/13/2013, Until Discontinued, Print    prochlorperazine (COMPAZINE) 10 MG tablet Take 10 mg by mouth every 6 (six) hours as needed for nausea or vomiting., Until Discontinued, Historical Med    SORAfenib (NEXAVAR) 200 MG tablet Take 400 mg by mouth every 12 (twelve) hours. Take on an empty stomach 1 hour before or 2 hours after meals., Until Discontinued, Historical Med    spironolactone (ALDACTONE) 25 MG tablet Take 1 tablet (25 mg total) by mouth daily., Starting 04/04/2014, Until Discontinued, Print    Tamsulosin HCl (FLOMAX) 0.4 MG CAPS Take 0.4 mg by mouth daily., Until Discontinued, Historical Med    Vitamin D, Ergocalciferol, (DRISDOL) 50000 UNITS CAPS Take 50,000 Units by mouth every Friday., Until Discontinued, Historical Med    zolpidem (AMBIEN) 10 MG tablet Take 10 mg by mouth at bedtime. , Until Discontinued, Historical Med    ondansetron (ZOFRAN ODT) 4 MG disintegrating tablet Take 1 tablet (4 mg total) by mouth every 8 (eight) hours as needed for nausea or vomiting., Starting 11/13/2013, Until Discontinued, Print      STOP taking these medications     ibuprofen (ADVIL,MOTRIN) 600 MG tablet      spironolactone-hydrochlorothiazide (ALDACTAZIDE) 50-50 MG per tablet        No Known Allergies Follow-up Information    Follow up with Gerrit Heck, MD. Schedule an appointment as soon as possible for a visit in 1 week.   Specialty: Family Medicine   Contact information:   [place] [address] [phone]         The results of significant diagnostics from this hospitalization (including imaging, microbiology, ancillary and laboratory) are listed below for reference.    Significant Diagnostic Studies:  Imaging Results    No results found.    Microbiology: Recent Results (from  the past 240 hour(s))  Culture, blood (routine x 2) Status: None (Preliminary result)   Collection Time: 08/17/14 12:45 AM  Result Value Ref Range Status   Specimen Description BLOOD LEFT ARM  Final   Special Requests BOTTLES DRAWN AEROBIC ONLY 10 CC  Final   Culture   Final     BLOOD CULTURE RECEIVED NO GROWTH TO DATE CULTURE WILL BE HELD FOR 5 DAYS BEFORE ISSUING A FINAL NEGATIVE REPORT Note: Culture results may be compromised due to an excessive volume of blood received in culture bottles. Performed at [facility]    Report Status PENDING  Incomplete  Culture, blood (routine x 2) Status: None (Preliminary result)   Collection Time: 08/17/14 12:45 AM  Result Value Ref Range Status   Specimen Description BLOOD RIGHT ARM  Final   Special Requests BOTTLES DRAWN AEROBIC AND ANAEROBIC 10 CC  Final   Culture   Final     BLOOD CULTURE RECEIVED NO GROWTH TO DATE CULTURE WILL BE HELD FOR 5 DAYS BEFORE ISSUING A FINAL NEGATIVE REPORT Note: Culture results may be compromised due to an excessive volume of blood received in culture bottles. Performed at [facility]    Report Status PENDING  Incomplete  MRSA PCR Screening Status: None   Collection Time: 08/17/14 5:31 AM  Result Value Ref Range Status   MRSA by PCR NEGATIVE  NEGATIVE Final    Comment:   The GeneXpert MRSA Assay (FDA approved for NASAL specimens only), is one component of a comprehensive MRSA colonization surveillance program. It is not intended to diagnose MRSA infection nor to guide or monitor treatment for MRSA infections.      Labs: Basic Metabolic Panel:  Last Labs      Recent Labs Lab 08/16/14 1919 08/17/14 0704 08/18/14 0659  NA 138 137 137  K 4.4 4.0 3.7  CL 106 105 103  CO2 25 25 27   GLUCOSE 110* 97 183*  BUN 7 8 8   CREATININE 0.64 0.65 0.63   CALCIUM 8.1* 8.0* 7.9*     Liver Function Tests:  Last Labs      Recent Labs Lab 08/16/14 1919 08/17/14 0704 08/18/14 0659  AST 60* 49* 43*  ALT 33 29 27  ALKPHOS 74 64 61  BILITOT 4.1* 4.1* 3.5*  PROT 6.4* 5.4* 5.5*  ALBUMIN 2.6* 2.1* 2.0*      Last Labs     No results for input(s): LIPASE, AMYLASE in the last 168 hours.    Last Labs      Recent Labs Lab 08/16/14 1919 08/18/14 0659  AMMONIA 159* 38*     CBC:  Last Labs      Recent Labs Lab 08/16/14 1919 08/17/14 0704 08/18/14 0659  WBC 5.1 4.2 4.2  HGB 16.0 15.0 14.0  HCT 44.3 41.9 38.9*  MCV 100.9* 101.0* 101.6*  PLT 63* 48* 53*     Cardiac Enzymes:  Last Labs     No results for input(s): CKTOTAL, CKMB, CKMBINDEX, TROPONINI in the last 168 hours.   BNP: BNP (last 3 results)  Recent Labs (within last 365 days)    No results for input(s): BNP in the last 8760 hours.    ProBNP (last 3 results)  Recent Labs (within last 365 days)    No results for input(s): PROBNP in the last 8760 hours.    CBG:  Last Labs      Recent Labs Lab 08/17/14 1151 08/17/14 1726 08/17/14 2058 08/18/14 0752 08/18/14 1155  GLUCAP 86 235* 251* 180* 219*         Signed:  Zoei Amison [facility] 08/19/2014, 12:39 PM

## 2014-08-23 LAB — CULTURE, BLOOD (ROUTINE X 2)
CULTURE: NO GROWTH
CULTURE: NO GROWTH

## 2014-10-02 ENCOUNTER — Other Ambulatory Visit (HOSPITAL_COMMUNITY): Payer: Self-pay | Admitting: Gastroenterology

## 2014-10-02 DIAGNOSIS — R140 Abdominal distension (gaseous): Secondary | ICD-10-CM

## 2014-10-05 ENCOUNTER — Encounter (HOSPITAL_COMMUNITY): Payer: Self-pay

## 2014-10-05 ENCOUNTER — Encounter (HOSPITAL_COMMUNITY)
Admission: RE | Admit: 2014-10-05 | Discharge: 2014-10-05 | Disposition: A | Discharge status: Home / Self Care | Payer: Medicare Other | Source: Ambulatory Visit | Attending: Gastroenterology | Admitting: Gastroenterology

## 2014-10-05 ENCOUNTER — Ambulatory Visit (HOSPITAL_COMMUNITY)
Admission: RE | Admit: 2014-10-05 | Discharge: 2014-10-05 | Disposition: A | Discharge status: Home / Self Care | Payer: Medicare Other | Source: Ambulatory Visit | Attending: Gastroenterology | Admitting: Gastroenterology

## 2014-10-05 DIAGNOSIS — R188 Other ascites: Secondary | ICD-10-CM | POA: Insufficient documentation

## 2014-10-05 DIAGNOSIS — R140 Abdominal distension (gaseous): Secondary | ICD-10-CM

## 2014-10-05 MED ORDER — SODIUM CHLORIDE 0.9 % IV SOLN
INTRAVENOUS | Status: DC
  Administered 2014-10-05: 14:00:00 via INTRAVENOUS

## 2014-10-05 MED ORDER — ALBUMIN HUMAN 25 % IV SOLN
50.0000 g | Freq: Once | INTRAVENOUS | Status: AC
  Administered 2014-10-05: 50 g via INTRAVENOUS
  Filled 2014-10-05: qty 200

## 2014-10-05 NOTE — Procedures (Signed)
Successful US guided paracentesis from LLQ.  Yielded 3 liters of serous fluid.  No immediate complications.  Pt tolerated well.   Specimen was not sent for labs.  Tsosie Billing D PA-C 10/05/2014 4:30 PM

## 2014-10-05 NOTE — Progress Notes (Signed)
50g of albumin given as ordered. Pt tolerated well. Pt taken via wheelchair to radiology for his paracentesis.

## 2014-10-18 ENCOUNTER — Other Ambulatory Visit (HOSPITAL_COMMUNITY): Payer: Self-pay | Admitting: Gastroenterology

## 2014-10-18 DIAGNOSIS — R188 Other ascites: Secondary | ICD-10-CM

## 2014-10-22 ENCOUNTER — Telehealth (HOSPITAL_COMMUNITY): Payer: Self-pay

## 2014-10-22 NOTE — Telephone Encounter (Signed)
Called pt to remind him of 2pm appt at [facility]... Gave pt appt info. AW

## 2014-10-23 ENCOUNTER — Ambulatory Visit (HOSPITAL_COMMUNITY)
Admission: RE | Admit: 2014-10-23 | Discharge: 2014-10-23 | Disposition: A | Discharge status: Home / Self Care | Payer: Medicare Other | Source: Ambulatory Visit | Attending: Gastroenterology | Admitting: Gastroenterology

## 2014-10-23 DIAGNOSIS — R188 Other ascites: Secondary | ICD-10-CM

## 2014-10-23 MED ORDER — ALBUMIN HUMAN 25 % IV SOLN
50.0000 g | Freq: Once | INTRAVENOUS | Status: AC
  Administered 2014-10-23: 50 g via INTRAVENOUS
  Filled 2014-10-23: qty 200

## 2014-10-23 MED ORDER — LIDOCAINE HCL (PF) 1 % IJ SOLN
INTRAMUSCULAR | Status: AC
  Filled 2014-10-23: qty 10

## 2014-10-23 NOTE — Procedures (Signed)
Successful US guided paracentesis from right lower quadrant.  Yielded 4 liters of clear yellow fluid.  No immediate complications.  Pt tolerated well.   Specimen was not sent for labs.  Abigail Butts S Chakira Jachim PA-C 10/23/2014 2:15 PM'

## 2014-11-05 ENCOUNTER — Other Ambulatory Visit (HOSPITAL_COMMUNITY): Payer: Self-pay | Admitting: Gastroenterology

## 2014-11-05 DIAGNOSIS — B1920 Unspecified viral hepatitis C without hepatic coma: Secondary | ICD-10-CM

## 2014-11-05 DIAGNOSIS — C220 Liver cell carcinoma: Secondary | ICD-10-CM

## 2014-11-12 ENCOUNTER — Encounter (HOSPITAL_COMMUNITY): Payer: Self-pay

## 2014-11-12 ENCOUNTER — Encounter (HOSPITAL_COMMUNITY)
Admission: RE | Admit: 2014-11-12 | Discharge: 2014-11-12 | Disposition: A | Discharge status: Home / Self Care | Payer: Medicare Other | Source: Ambulatory Visit | Attending: Gastroenterology | Admitting: Gastroenterology

## 2014-11-12 ENCOUNTER — Ambulatory Visit (HOSPITAL_COMMUNITY)
Admission: RE | Admit: 2014-11-12 | Discharge: 2014-11-12 | Disposition: A | Discharge status: Home / Self Care | Payer: Medicare Other | Source: Ambulatory Visit | Attending: Gastroenterology | Admitting: Gastroenterology

## 2014-11-12 DIAGNOSIS — R188 Other ascites: Secondary | ICD-10-CM | POA: Insufficient documentation

## 2014-11-12 DIAGNOSIS — B1920 Unspecified viral hepatitis C without hepatic coma: Secondary | ICD-10-CM

## 2014-11-12 DIAGNOSIS — C220 Liver cell carcinoma: Secondary | ICD-10-CM

## 2014-11-12 HISTORY — DX: Other ascites: R18.8

## 2014-11-12 MED ORDER — SODIUM CHLORIDE 0.9 % IV SOLN
250.0000 mL | Freq: Once | INTRAVENOUS | Status: AC
  Administered 2014-11-12: 250 mL via INTRAVENOUS

## 2014-11-12 MED ORDER — ALBUMIN HUMAN 25 % IV SOLN
50.0000 g | Freq: Once | INTRAVENOUS | Status: AC
  Administered 2014-11-12: 50 g via INTRAVENOUS
  Filled 2014-11-12: qty 200

## 2014-11-12 NOTE — Procedures (Signed)
Successful US guided paracentesis from RLQ.  Yielded 5 liters of serous fluid.  No immediate complications.  Pt tolerated well.   Specimen was not sent for labs.  Tsosie Billing D PA-C 11/12/2014 12:13 PM

## 2014-11-25 ENCOUNTER — Emergency Department (HOSPITAL_COMMUNITY)
Admission: EM | Admit: 2014-11-25 | Discharge: 2014-11-25 | Disposition: A | Discharge status: Home / Self Care | Payer: Medicare Other | Attending: Emergency Medicine | Admitting: Emergency Medicine

## 2014-11-25 ENCOUNTER — Encounter (HOSPITAL_COMMUNITY): Payer: Self-pay

## 2014-11-25 ENCOUNTER — Emergency Department (HOSPITAL_COMMUNITY): Payer: Medicare Other

## 2014-11-25 DIAGNOSIS — Z87891 Personal history of nicotine dependence: Secondary | ICD-10-CM | POA: Diagnosis not present

## 2014-11-25 DIAGNOSIS — Z872 Personal history of diseases of the skin and subcutaneous tissue: Secondary | ICD-10-CM | POA: Diagnosis not present

## 2014-11-25 DIAGNOSIS — E119 Type 2 diabetes mellitus without complications: Secondary | ICD-10-CM | POA: Insufficient documentation

## 2014-11-25 DIAGNOSIS — Z8719 Personal history of other diseases of the digestive system: Secondary | ICD-10-CM | POA: Insufficient documentation

## 2014-11-25 DIAGNOSIS — R011 Cardiac murmur, unspecified: Secondary | ICD-10-CM | POA: Insufficient documentation

## 2014-11-25 DIAGNOSIS — R410 Disorientation, unspecified: Secondary | ICD-10-CM | POA: Insufficient documentation

## 2014-11-25 DIAGNOSIS — Z794 Long term (current) use of insulin: Secondary | ICD-10-CM | POA: Insufficient documentation

## 2014-11-25 DIAGNOSIS — I10 Essential (primary) hypertension: Secondary | ICD-10-CM | POA: Insufficient documentation

## 2014-11-25 DIAGNOSIS — Z8619 Personal history of other infectious and parasitic diseases: Secondary | ICD-10-CM | POA: Insufficient documentation

## 2014-11-25 DIAGNOSIS — Z8505 Personal history of malignant neoplasm of liver: Secondary | ICD-10-CM | POA: Diagnosis not present

## 2014-11-25 DIAGNOSIS — Z87438 Personal history of other diseases of male genital organs: Secondary | ICD-10-CM | POA: Diagnosis not present

## 2014-11-25 DIAGNOSIS — Z862 Personal history of diseases of the blood and blood-forming organs and certain disorders involving the immune mechanism: Secondary | ICD-10-CM | POA: Insufficient documentation

## 2014-11-25 DIAGNOSIS — Z79899 Other long term (current) drug therapy: Secondary | ICD-10-CM | POA: Diagnosis not present

## 2014-11-25 DIAGNOSIS — R4182 Altered mental status, unspecified: Secondary | ICD-10-CM | POA: Diagnosis present

## 2014-11-25 LAB — CBC WITH DIFFERENTIAL/PLATELET
Basophils Absolute: 0 10*3/uL (ref 0.0–0.1)
Basophils Relative: 1 % (ref 0–1)
EOS PCT: 4 % (ref 0–5)
Eosinophils Absolute: 0.2 10*3/uL (ref 0.0–0.7)
HEMATOCRIT: 43.9 % (ref 39.0–52.0)
Hemoglobin: 15.8 g/dL (ref 13.0–17.0)
LYMPHS PCT: 18 % (ref 12–46)
Lymphs Abs: 0.8 10*3/uL (ref 0.7–4.0)
MCH: 37.2 pg — AB (ref 26.0–34.0)
MCHC: 36 g/dL (ref 30.0–36.0)
MCV: 103.3 fL — AB (ref 78.0–100.0)
MONO ABS: 0.5 10*3/uL (ref 0.1–1.0)
Monocytes Relative: 10 % (ref 3–12)
NEUTROS ABS: 3.1 10*3/uL (ref 1.7–7.7)
Neutrophils Relative %: 68 % (ref 43–77)
PLATELETS: 61 10*3/uL — AB (ref 150–400)
RBC: 4.25 MIL/uL (ref 4.22–5.81)
RDW: 14.3 % (ref 11.5–15.5)
WBC: 4.6 10*3/uL (ref 4.0–10.5)

## 2014-11-25 LAB — COMPREHENSIVE METABOLIC PANEL
ALBUMIN: 2.9 g/dL — AB (ref 3.5–5.0)
ALT: 27 U/L (ref 17–63)
ANION GAP: 6 (ref 5–15)
AST: 52 U/L — ABNORMAL HIGH (ref 15–41)
Alkaline Phosphatase: 74 U/L (ref 38–126)
BUN: 11 mg/dL (ref 6–20)
CHLORIDE: 107 mmol/L (ref 101–111)
CO2: 23 mmol/L (ref 22–32)
Calcium: 8.3 mg/dL — ABNORMAL LOW (ref 8.9–10.3)
Creatinine, Ser: 0.66 mg/dL (ref 0.61–1.24)
GFR calc Af Amer: >60 mL/min (ref >60)
GFR calc non Af Amer: >60 mL/min (ref >60)
GLUCOSE: 161 mg/dL — AB (ref 65–99)
POTASSIUM: 4 mmol/L (ref 3.5–5.1)
Sodium: 136 mmol/L (ref 135–145)
Total Bilirubin: 3 mg/dL — ABNORMAL HIGH (ref 0.3–1.2)
Total Protein: 6.9 g/dL (ref 6.5–8.1)

## 2014-11-25 LAB — AMMONIA: Ammonia: 98 umol/L — ABNORMAL HIGH (ref 9–35)

## 2014-11-25 LAB — ETHANOL: Alcohol, Ethyl (B): <5 mg/dL (ref <5)

## 2014-11-25 NOTE — ED Notes (Signed)
Pt stable, ambulatory, states understanding of discharge instructions 

## 2014-11-25 NOTE — ED Notes (Signed)
pts brother reports pt was acting confused today. A&Ox4 now, but was holding phone today when brother came to house and no one was on the phone.  Pt forgot to eat today.

## 2014-11-25 NOTE — ED Provider Notes (Signed)
CSN: [id number]     Arrival date & time 11/25/14  1926 History   First MD Initiated Contact with Patient 11/25/14 2229     Chief Complaint  Patient presents with  . Altered Mental Status     (Consider location/radiation/quality/duration/timing/severity/associated sxs/prior Treatment) HPI Comments: 64 year old male with extensive past medical history including cirrhosis secondary to hepatitis C, IDDM, hypertension, psoriasis who presents with confusion. History obtained with the assistance of the patient's brother. The patient's brother checks on him daily and today noticed that he was more confused at home. Brother spoke with him yesterday and had a normal conversation on the phone. However, today, the patient forgot to eat and seemed confused, similar to previous episodes when his ammonia level was high. Patient had admitted to not taking his lactulose because he has so much diarrhea. His brother gave him a dose of lactulose and then brought him to the emergency department. While in the waiting room, the patient has had several bowel movements and Brother states that his confusion has essentially resolved. Patient currently states that he feels fine. He denies any chest pain, shortness of breath, or abdominal pain. He occasionally has abdominal pain randomly but denies any escalation of pain recently. No fevers, chills, or sweats.  Patient is a 65 y.o. male presenting with altered mental status. The history is provided by the patient.  Altered Mental Status   Past Medical History  Diagnosis Date  . Macrocytosis   . Thrombocytopenia   . Esophageal bleed, non-variceal 2006  . HTN (hypertension)   . Psoriasis   . Mitral valve prolapse     OCCAS FLUTTER FEELING  . Vitamin D deficiency   . Umbilical hernia     OCCAS DISCOMFORT  . Prostate enlargement     PT TOLD "NORMAL FOR AGE" - TAKES FINASTERIDE AND FLOMAX  . Heart murmur   . Type II diabetes mellitus   . Rheumatic fever 1950's  .  Hepatitis C 1980's  . Arthritis     "joint stiffness in the knees" (11/10/2013)  . Liver cancer   . Ascites    Past Surgical History  Procedure Laterality Date  . Tonsilectomy, adenoidectomy, bilateral myringotomy and tubes  child  . Umbilical hernia repair  03/09/2012    Procedure: HERNIA REPAIR UMBILICAL ADULT;  Surgeon: Edward Jolly, MD;  Location: [facility];  Service: General;  Laterality: N/A;  Repair Umbilical Hernia with Mesh  . Insertion of mesh  03/09/2012    Procedure: INSERTION OF MESH;  Surgeon: Edward Jolly, MD;  Location: [facility];  Service: General;  Laterality: N/A;  . Inguinal hernia repair Left 1970's  . Hernia repair    . Tonsillectomy     Family History  Problem Relation Age of Onset  . Prostate cancer Brother   . COPD Mother   . Heart disease Father   . Diabetes Father   . Alcohol abuse Father   . Liver cancer Brother   . Colon cancer Brother   . COPD Maternal Grandfather    Social History  Substance Use Topics  . Smoking status: Former Smoker -- 20 years    Types: Cigars    Start date: 12/22/2005  . Smokeless tobacco: Former Systems developer    Types: Snuff     Comment: "quit smoking ~ 2009; quit snuff in ~ 2013"  . Alcohol Use: Yes     Comment: "quit in late April 2015"    Review of Systems  10 Systems reviewed and are  negative for acute change except as noted in the HPI.   Allergies  Review of patient's allergies indicates no known allergies.  Home Medications   Prior to Admission medications   Medication Sig Start Date End Date Taking? Authorizing Provider  Carboxymethylcellul-Glycerin (CLEAR EYES FOR DRY EYES) 1-0.25 % SOLN Apply 1-2 drops to eye daily as needed. For dry eyes    Historical Provider, MD  dicyclomine (BENTYL) 10 MG capsule Take 1 capsule (10 mg total) by mouth 2 (two) times daily. 11/13/13   Ripudeep Krystal Eaton, MD  furosemide (LASIX) 40 MG tablet Take 40 mg by mouth daily.    Historical Provider, MD  insulin glargine (LANTUS) 100  UNIT/ML injection Inject 0.08 mLs (8 Units total) into the skin daily. 04/04/14   Shanker Kristeen Mans, MD  lactulose (CHRONULAC) 10 GM/15ML solution Take 30 mLs (20 g total) by mouth 2 (two) times daily. Titrate for 2-3soft stools/day 08/18/14   Domenic Polite, MD  nadolol (CORGARD) 20 MG tablet Take 20 mg by mouth daily.    Historical Provider, MD  ondansetron (ZOFRAN ODT) 4 MG disintegrating tablet Take 1 tablet (4 mg total) by mouth every 8 (eight) hours as needed for nausea or vomiting. Patient not taking: Reported on 08/16/2014 11/13/13   Ripudeep Krystal Eaton, MD  oxyCODONE (OXY IR/ROXICODONE) 5 MG immediate release tablet Take 1 tablet (5 mg total) by mouth every 12 (twelve) hours as needed for severe pain or breakthrough pain (only for severe or breakthrough pain). Patient taking differently: Take 5 mg by mouth every 4 (four) hours as needed (pain).  11/13/13   Ripudeep Krystal Eaton, MD  prochlorperazine (COMPAZINE) 10 MG tablet Take 10 mg by mouth every 6 (six) hours as needed for nausea or vomiting.    Historical Provider, MD  SORAfenib (NEXAVAR) 200 MG tablet Take 400 mg by mouth every 12 (twelve) hours. Take on an empty stomach 1 hour before or 2 hours after meals.    Historical Provider, MD  spironolactone (ALDACTONE) 25 MG tablet Take 1 tablet (25 mg total) by mouth daily. 04/04/14   Shanker Kristeen Mans, MD  Tamsulosin HCl (FLOMAX) 0.4 MG CAPS Take 0.4 mg by mouth daily.    Historical Provider, MD  Vitamin D, Ergocalciferol, (DRISDOL) 50000 UNITS CAPS Take 50,000 Units by mouth every Friday.    Historical Provider, MD  zolpidem (AMBIEN) 10 MG tablet Take 10 mg by mouth at bedtime.     Historical Provider, MD   BP 147/91 mmHg  Pulse 78  Temp(Src) 98.2 F (36.8 C) (Oral)  Resp 16  Ht 5\' 10"  (1.778 m)  Wt 158 lb 6.4 oz (71.85 kg)  BMI 22.73 kg/m2  SpO2 95% Physical Exam  Constitutional: He is oriented to person, place, and time. No distress.  Thin, chronically ill-appearing man in no acute distress  HENT:   Head: Normocephalic and atraumatic.  Moist mucous membranes  Eyes: Pupils are equal, round, and reactive to light. Scleral icterus is present.  Neck: Neck supple.  Cardiovascular: Normal rate and regular rhythm.   4/6 systolic click murmur  Pulmonary/Chest: Effort normal and breath sounds normal. No respiratory distress.  Abdominal: Soft. Bowel sounds are normal. There is no tenderness.  Mild distention with ventral and umbilical hernias, easily reducible  Musculoskeletal: He exhibits no edema.  Neurological: He is alert and oriented to person, place, and time.  Fluent speech, answers questions appropriately  Skin: Skin is warm and dry.  Spider angiomata on chest, jaundice, diffuse psoriatic plaques  on the trunk, legs, feet  Psychiatric: He has a normal mood and affect. Judgment normal.  Nursing note and vitals reviewed.   ED Course  Procedures (including critical care time) Labs Review Labs Reviewed  COMPREHENSIVE METABOLIC PANEL - Abnormal; Notable for the following:    Glucose, Bld 161 (*)    Calcium 8.3 (*)    Albumin 2.9 (*)    AST 52 (*)    Total Bilirubin 3.0 (*)    All other components within normal limits  CBC WITH DIFFERENTIAL/PLATELET - Abnormal; Notable for the following:    MCV 103.3 (*)    MCH 37.2 (*)    Platelets 61 (*)    All other components within normal limits  AMMONIA - Abnormal; Notable for the following:    Ammonia 98 (*)    All other components within normal limits  ETHANOL  URINE RAPID DRUG SCREEN, HOSP PERFORMED  URINALYSIS, ROUTINE W REFLEX MICROSCOPIC (NOT AT [facility])    Imaging Review No results found. I have personally reviewed and evaluated these lab results as part of my medical decision-making.   EKG Interpretation None      MDM   Final diagnoses:  Confusion   64 year old male with history of cirrhosis secondary to hepatitis C who presents with confusion that brother noticed today. Patient admitted to noncompliance with lactulose  recently because he does not like having diarrhea all the time. At presentation, the patient was awake, alert, chronically ill-appearing but in no acute distress. Vital signs unremarkable. No abdominal tenderness on exam. The patient was oriented and conversant during interview. Brother stated that he was back to his neurologic baseline and he felt that the patient had significantly improved since he had taken the lactulose earlier today prior to arrival. He denies any recent falls and has no evidence of head injury. He is demonstrating no confusion here and is able to discuss at length his current medication regimen as well as his scheduled follow-up with his gastroenterologist this week. Labs show ammonia level of 98 but otherwise CMP and CBC are at the patient's baseline. Patient has no abdominal pain on exam and no fever to suggest SBP. The patient ate a sandwich and ginger ale in the emergency department. I suspect that his symptoms were due to hepatic encephalopathy secondary to medication noncompliance, which has now improved since he took a dose of lactulose this afternoon. I discussed with the patient the importance of taking lactulose and encouraged him to titrate to effect. The patient and his brother voiced understanding. Patient already has an appointment this week and I emphasized importance of follow-up. Patient and brother felt comfortable going home and voiced understanding of return precautions. Brother plans to continue to monitor patient closely. Patient discharged in satisfactory condition.  Sharlett Iles, MD 11/25/14 [phone]

## 2014-11-25 NOTE — ED Notes (Signed)
Pt here with his brother.  Pt reports not clearly thiinking today and wants his ammonia level checked.  Pt had BM this morning, brother reports if pt doesn't take his Lactulose and have 2 BM's a day, his ammonia level increases and pt gets confused.

## 2015-02-22 ENCOUNTER — Inpatient Hospital Stay (HOSPITAL_COMMUNITY)
Admission: EM | Admit: 2015-02-22 | Discharge: 2015-02-25 | DRG: 442 | Disposition: A | Discharge status: Home / Self Care | Payer: Medicare Other | Attending: Internal Medicine | Admitting: Internal Medicine

## 2015-02-22 ENCOUNTER — Encounter (HOSPITAL_COMMUNITY): Payer: Self-pay | Admitting: *Deleted

## 2015-02-22 DIAGNOSIS — Z9114 Patient's other noncompliance with medication regimen: Secondary | ICD-10-CM | POA: Diagnosis not present

## 2015-02-22 DIAGNOSIS — R188 Other ascites: Secondary | ICD-10-CM

## 2015-02-22 DIAGNOSIS — L8990 Pressure ulcer of unspecified site, unspecified stage: Secondary | ICD-10-CM | POA: Diagnosis present

## 2015-02-22 DIAGNOSIS — I10 Essential (primary) hypertension: Secondary | ICD-10-CM | POA: Diagnosis present

## 2015-02-22 DIAGNOSIS — E118 Type 2 diabetes mellitus with unspecified complications: Secondary | ICD-10-CM

## 2015-02-22 DIAGNOSIS — Z8505 Personal history of malignant neoplasm of liver: Secondary | ICD-10-CM

## 2015-02-22 DIAGNOSIS — E119 Type 2 diabetes mellitus without complications: Secondary | ICD-10-CM | POA: Diagnosis present

## 2015-02-22 DIAGNOSIS — C229 Malignant neoplasm of liver, not specified as primary or secondary: Secondary | ICD-10-CM | POA: Diagnosis present

## 2015-02-22 DIAGNOSIS — B182 Chronic viral hepatitis C: Secondary | ICD-10-CM | POA: Diagnosis present

## 2015-02-22 DIAGNOSIS — N400 Enlarged prostate without lower urinary tract symptoms: Secondary | ICD-10-CM | POA: Diagnosis present

## 2015-02-22 DIAGNOSIS — K7460 Unspecified cirrhosis of liver: Secondary | ICD-10-CM | POA: Diagnosis present

## 2015-02-22 DIAGNOSIS — C220 Liver cell carcinoma: Secondary | ICD-10-CM

## 2015-02-22 DIAGNOSIS — K7290 Hepatic failure, unspecified without coma: Principal | ICD-10-CM | POA: Diagnosis present

## 2015-02-22 DIAGNOSIS — R4182 Altered mental status, unspecified: Secondary | ICD-10-CM

## 2015-02-22 DIAGNOSIS — K7682 Hepatic encephalopathy: Secondary | ICD-10-CM

## 2015-02-22 DIAGNOSIS — D696 Thrombocytopenia, unspecified: Secondary | ICD-10-CM | POA: Diagnosis present

## 2015-02-22 LAB — CBC
HCT: 43.6 % (ref 39.0–52.0)
HEMOGLOBIN: 15.9 g/dL (ref 13.0–17.0)
MCH: 37.8 pg — ABNORMAL HIGH (ref 26.0–34.0)
MCHC: 36.5 g/dL — ABNORMAL HIGH (ref 30.0–36.0)
MCV: 103.6 fL — ABNORMAL HIGH (ref 78.0–100.0)
PLATELETS: 66 10*3/uL — AB (ref 150–400)
RBC: 4.21 MIL/uL — AB (ref 4.22–5.81)
RDW: 14.1 % (ref 11.5–15.5)
WBC: 5.1 10*3/uL (ref 4.0–10.5)

## 2015-02-22 LAB — COMPREHENSIVE METABOLIC PANEL
ALBUMIN: 3 g/dL — AB (ref 3.5–5.0)
ALT: 40 U/L (ref 17–63)
ANION GAP: 8 (ref 5–15)
AST: 59 U/L — AB (ref 15–41)
Alkaline Phosphatase: 80 U/L (ref 38–126)
BILIRUBIN TOTAL: 3.1 mg/dL — AB (ref 0.3–1.2)
BUN: 19 mg/dL (ref 6–20)
CHLORIDE: 102 mmol/L (ref 101–111)
CO2: 25 mmol/L (ref 22–32)
Calcium: 8.7 mg/dL — ABNORMAL LOW (ref 8.9–10.3)
Creatinine, Ser: 0.97 mg/dL (ref 0.61–1.24)
GFR calc Af Amer: >60 mL/min (ref >60)
GFR calc non Af Amer: >60 mL/min (ref >60)
GLUCOSE: 103 mg/dL — AB (ref 65–99)
Potassium: 5 mmol/L (ref 3.5–5.1)
Sodium: 135 mmol/L (ref 135–145)
Total Protein: 7 g/dL (ref 6.5–8.1)

## 2015-02-22 LAB — AMMONIA: AMMONIA: 142 umol/L — AB (ref 9–35)

## 2015-02-22 LAB — CBG MONITORING, ED: Glucose-Capillary: 92 mg/dL (ref 65–99)

## 2015-02-22 MED ORDER — INSULIN GLARGINE 100 UNIT/ML ~~LOC~~ SOLN
8.0000 [IU] | Freq: Every day | SUBCUTANEOUS | Status: DC
  Administered 2015-02-23 – 2015-02-25 (×3): 8 [IU] via SUBCUTANEOUS
  Filled 2015-02-22 (×4): qty 0.08

## 2015-02-22 MED ORDER — TAMSULOSIN HCL 0.4 MG PO CAPS
0.4000 mg | ORAL_CAPSULE | Freq: Every day | ORAL | Status: DC
Start: 2015-02-23 — End: 2015-02-25
  Administered 2015-02-23 – 2015-02-25 (×3): 0.4 mg via ORAL
  Filled 2015-02-22 (×3): qty 1

## 2015-02-22 MED ORDER — LACTULOSE 10 GM/15ML PO SOLN
20.0000 g | Freq: Once | ORAL | Status: AC
  Administered 2015-02-22: 20 g via ORAL
  Filled 2015-02-22: qty 30

## 2015-02-22 MED ORDER — SODIUM CHLORIDE 0.9 % IV SOLN
INTRAVENOUS | Status: AC
  Administered 2015-02-23: 07:00:00 via INTRAVENOUS

## 2015-02-22 MED ORDER — INSULIN ASPART 100 UNIT/ML ~~LOC~~ SOLN
0.0000 [IU] | Freq: Three times a day (TID) | SUBCUTANEOUS | Status: DC
  Administered 2015-02-23: 2 [IU] via SUBCUTANEOUS
  Administered 2015-02-23 – 2015-02-24 (×2): 1 [IU] via SUBCUTANEOUS
  Administered 2015-02-24: 3 [IU] via SUBCUTANEOUS
  Administered 2015-02-24: 2 [IU] via SUBCUTANEOUS
  Administered 2015-02-25: 1 [IU] via SUBCUTANEOUS
  Administered 2015-02-25: 3 [IU] via SUBCUTANEOUS

## 2015-02-22 MED ORDER — SPIRONOLACTONE 25 MG PO TABS
25.0000 mg | ORAL_TABLET | Freq: Every day | ORAL | Status: DC
  Administered 2015-02-23 – 2015-02-25 (×3): 25 mg via ORAL
  Filled 2015-02-22 (×3): qty 1

## 2015-02-22 MED ORDER — INSULIN ASPART 100 UNIT/ML ~~LOC~~ SOLN: 0.0000 [IU] | Freq: Every day | SUBCUTANEOUS | Status: DC

## 2015-02-22 MED ORDER — FUROSEMIDE 40 MG PO TABS
40.0000 mg | ORAL_TABLET | Freq: Every day | ORAL | Status: DC
  Administered 2015-02-23 – 2015-02-25 (×3): 40 mg via ORAL
  Filled 2015-02-22 (×3): qty 1

## 2015-02-22 MED ORDER — ALUM & MAG HYDROXIDE-SIMETH 200-200-20 MG/5ML PO SUSP: 30.0000 mL | Freq: Four times a day (QID) | ORAL | Status: DC | PRN

## 2015-02-22 MED ORDER — ACETAMINOPHEN 650 MG RE SUPP: 650.0000 mg | Freq: Four times a day (QID) | RECTAL | Status: DC | PRN

## 2015-02-22 MED ORDER — NADOLOL 20 MG PO TABS
20.0000 mg | ORAL_TABLET | Freq: Every day | ORAL | Status: DC
  Administered 2015-02-23 – 2015-02-25 (×3): 20 mg via ORAL
  Filled 2015-02-22 (×3): qty 1

## 2015-02-22 MED ORDER — SODIUM CHLORIDE 0.9 % IV SOLN
INTRAVENOUS | Status: AC
  Administered 2015-02-23: via INTRAVENOUS

## 2015-02-22 MED ORDER — SODIUM CHLORIDE 0.9 % IV BOLUS (SEPSIS)
500.0000 mL | Freq: Once | INTRAVENOUS | Status: AC
  Administered 2015-02-22: 500 mL via INTRAVENOUS

## 2015-02-22 MED ORDER — HYDROMORPHONE HCL 1 MG/ML IJ SOLN: 0.5000 mg | INTRAMUSCULAR | Status: DC | PRN

## 2015-02-22 MED ORDER — ACETAMINOPHEN 325 MG PO TABS: 650.0000 mg | ORAL_TABLET | Freq: Three times a day (TID) | ORAL | Status: DC | PRN

## 2015-02-22 MED ORDER — ONDANSETRON HCL 4 MG/2ML IJ SOLN: 4.0000 mg | Freq: Four times a day (QID) | INTRAMUSCULAR | Status: DC | PRN

## 2015-02-22 MED ORDER — ONDANSETRON HCL 4 MG PO TABS: 4.0000 mg | ORAL_TABLET | Freq: Four times a day (QID) | ORAL | Status: DC | PRN

## 2015-02-22 MED ORDER — LACTULOSE 10 GM/15ML PO SOLN
20.0000 g | Freq: Three times a day (TID) | ORAL | Status: DC
  Administered 2015-02-23 – 2015-02-24 (×4): 20 g via ORAL
  Filled 2015-02-22 (×4): qty 30

## 2015-02-22 MED ORDER — SODIUM CHLORIDE 0.9 % IV BOLUS (SEPSIS): 500.0000 mL | Freq: Once | INTRAVENOUS | Status: AC

## 2015-02-22 NOTE — H&P (Signed)
[facility] Admission History and Physical       Ethan Wade [id number] DOB: 1950-04-28 DOA: 02/22/2015  Referring physician: EDP PCP: Ethan Heck, MD  Specialists:   Chief Complaint: Confused  HPI: Ethan Wade is a 64 y.o. male with a history of Hepatic Cancer, Hep C, Cirrhosis who presents to the ED with complaints of increased confusion and Lethargy today.  His family reported that he was normal yesterday but when they called him today he was confused, and they found him sluggish.  His brother reports that he has not been taking his lactulose regularly.   In the ED, his Ammonia level was found at 142.  He was administered 1 dose of lactulose in the ED and referred for admission.      Review of Systems:  Constitutional: No Weight Loss, No Weight Gain, Night Sweats, Fevers, Chills, Dizziness, Light Headedness, Fatigue, +Generalized Weakness HEENT: No Headaches, Difficulty Swallowing,Tooth/Dental Problems,Sore Throat,  No Sneezing, Rhinitis, Ear Ache, Nasal Congestion, or Post Nasal Drip,  Cardio-vascular:  No Chest pain, Orthopnea, PND, Edema in Lower Extremities, Anasarca, Dizziness, Palpitations  Resp: No Dyspnea, No DOE, No Productive Cough, No Non-Productive Cough, No Hemoptysis, No Wheezing.    GI: No Heartburn, Indigestion, Abdominal Pain, Nausea, Vomiting, Diarrhea, Constipation, Hematemesis, Hematochezia, Melena, Change in Bowel Habits,  Loss of Appetite  GU: No Dysuria, No Change in Color of Urine, No Urgency or Urinary Frequency, No Flank pain.  Musculoskeletal: No Joint Pain or Swelling, No Decreased Range of Motion, No Back Pain.  Neurologic: No Syncope, No Seizures, Muscle Weakness, Paresthesia, Vision Disturbance or Loss, No Diplopia, No Vertigo, No Difficulty Walking,  Skin: No Rash or Lesions. Psych: No Change in Mood or Affect, No Depression or Anxiety, No Memory loss,  +Confusion, or Hallucinations   Past Medical History  Diagnosis  Date  . Macrocytosis   . Thrombocytopenia ([place])   . Esophageal bleed, non-variceal 2006  . HTN (hypertension)   . Psoriasis   . Mitral valve prolapse     OCCAS FLUTTER FEELING  . Vitamin D deficiency   . Umbilical hernia     OCCAS DISCOMFORT  . Prostate enlargement     PT TOLD "NORMAL FOR AGE" - TAKES FINASTERIDE AND FLOMAX  . Heart murmur   . Type II diabetes mellitus ([place])   . Rheumatic fever 1950's  . Hepatitis C 1980's  . Arthritis     "joint stiffness in the knees" (11/10/2013)  . Liver cancer ([place])   . Ascites      Past Surgical History  Procedure Laterality Date  . Tonsilectomy, adenoidectomy, bilateral myringotomy and tubes  child  . Umbilical hernia repair  03/09/2012    Procedure: HERNIA REPAIR UMBILICAL ADULT;  Surgeon: Ethan Jolly, MD;  Location: [facility];  Service: General;  Laterality: N/A;  Repair Umbilical Hernia with Mesh  . Insertion of mesh  03/09/2012    Procedure: INSERTION OF MESH;  Surgeon: Ethan Jolly, MD;  Location: [facility];  Service: General;  Laterality: N/A;  . Inguinal hernia repair Left 1970's  . Hernia repair    . Tonsillectomy        Prior to Admission medications   Medication Sig Start Date End Date Taking? Authorizing Provider  Carboxymethylcellul-Glycerin (CLEAR EYES FOR DRY EYES) 1-0.25 % SOLN Apply 1-2 drops to eye daily as needed. For dry eyes   Yes Historical Provider, MD  dicyclomine (BENTYL) 10 MG capsule Take 1 capsule (10 mg total) by mouth 2 (two) times  daily. 11/13/13  Yes Ripudeep Krystal Eaton, MD  furosemide (LASIX) 40 MG tablet Take 40 mg by mouth daily.   Yes Historical Provider, MD  insulin glargine (LANTUS) 100 UNIT/ML injection Inject 0.08 mLs (8 Units total) into the skin daily. 04/04/14  Yes Shanker Kristeen Mans, MD  lactulose (CHRONULAC) 10 GM/15ML solution Take 30 mLs (20 g total) by mouth 2 (two) times daily. Titrate for 2-3soft stools/day 08/18/14  Yes Domenic Polite, MD  nadolol (CORGARD) 20 MG tablet Take 20 mg by  mouth daily.   Yes Historical Provider, MD  oxyCODONE (OXY IR/ROXICODONE) 5 MG immediate release tablet Take 1 tablet (5 mg total) by mouth every 12 (twelve) hours as needed for severe pain or breakthrough pain (only for severe or breakthrough pain). Patient taking differently: Take 5 mg by mouth every 4 (four) hours as needed (pain).  11/13/13  Yes Ripudeep Krystal Eaton, MD  prochlorperazine (COMPAZINE) 10 MG tablet Take 10 mg by mouth every 6 (six) hours as needed for nausea or vomiting.   Yes Historical Provider, MD  SORAfenib (NEXAVAR) 200 MG tablet Take 400 mg by mouth every 12 (twelve) hours. Take on an empty stomach 1 hour before or 2 hours after meals.   Yes Historical Provider, MD  spironolactone (ALDACTONE) 25 MG tablet Take 1 tablet (25 mg total) by mouth daily. 04/04/14  Yes Shanker Kristeen Mans, MD  Tamsulosin HCl (FLOMAX) 0.4 MG CAPS Take 0.4 mg by mouth daily.   Yes Historical Provider, MD  Vitamin D, Ergocalciferol, (DRISDOL) 50000 UNITS CAPS Take 50,000 Units by mouth every Friday.   Yes Historical Provider, MD  zolpidem (AMBIEN) 10 MG tablet Take 10 mg by mouth at bedtime.    Yes Historical Provider, MD  ondansetron (ZOFRAN ODT) 4 MG disintegrating tablet Take 1 tablet (4 mg total) by mouth every 8 (eight) hours as needed for nausea or vomiting. Patient not taking: Reported on 08/16/2014 11/13/13   Ripudeep Krystal Eaton, MD     No Known Allergies  Social History:  reports that he has quit smoking. His smoking use included Cigars. He started smoking about 9 years ago. He has quit using smokeless tobacco. His smokeless tobacco use included Snuff. He reports that he drinks alcohol. He reports that he uses illicit drugs.    Family History  Problem Relation Age of Onset  . Prostate cancer Brother   . COPD Mother   . Heart disease Father   . Diabetes Father   . Alcohol abuse Father   . Liver cancer Brother   . Colon cancer Brother   . COPD Maternal Grandfather        Physical Exam:  GEN:   Pleasant Well Nourished and Well Developed 64 y.o. Caucasian male examined and in no acute distress; cooperative with exam Filed Vitals:   02/22/15 2215 02/22/15 2230 02/22/15 2245 02/22/15 2313  BP: 114/78 118/72 105/75 110/70  Pulse: 63 69 67 67  Temp:    98.3 F (36.8 C)  TempSrc:    Oral  Resp: 15 17 12 18   SpO2: 98% 99% 99% 100%   Blood pressure 110/70, pulse 67, temperature 98.3 F (36.8 C), temperature source Oral, resp. rate 18, SpO2 100 %. PSYCH: He is alert and oriented x 1; does not appear anxious does not appear depressed; affect is normal HEENT: Normocephalic and Atraumatic, Mucous membranes pink; PERRLA; EOM intact; Fundi:  Benign;  No scleral icterus, Nares: Patent, Oropharynx: Clear, Fair Dentition,    Neck:  FROM, No  Cervical Lymphadenopathy nor Thyromegaly or Carotid Bruit; No JVD; Breasts:: Not examined CHEST WALL: No tenderness CHEST: Normal respiration, clear to auscultation bilaterally HEART: Regular rate and rhythm; no murmurs rubs or gallops BACK: No kyphosis or scoliosis; No CVA tenderness ABDOMEN: Positive Bowel Sounds, Soft Non-Tender, No Rebound or Guarding; No Masses, No Organomegaly. Rectal Exam: Not done EXTREMITIES: No Cyanosis, Clubbing, or Edema; No Ulcerations. Genitalia: not examined PULSES: 2+ and symmetric SKIN: Normal hydration no rash or ulceration CNS:  Alert and Oriented x 1, No Focal Deficits Vascular: pulses palpable throughout    Labs on Admission:  Basic Metabolic Panel:  Recent Labs Lab 02/22/15 1736  NA 135  K 5.0  CL 102  CO2 25  GLUCOSE 103*  BUN 19  CREATININE 0.97  CALCIUM 8.7*   Liver Function Tests:  Recent Labs Lab 02/22/15 1736  AST 59*  ALT 40  ALKPHOS 80  BILITOT 3.1*  PROT 7.0  ALBUMIN 3.0*   No results for input(s): LIPASE, AMYLASE in the last 168 hours.  Recent Labs Lab 02/22/15 1758  AMMONIA 142*   CBC:  Recent Labs Lab 02/22/15 1736  WBC 5.1  HGB 15.9  HCT 43.6  MCV 103.6*  PLT 66*    Cardiac Enzymes: No results for input(s): CKTOTAL, CKMB, CKMBINDEX, TROPONINI in the last 168 hours.  BNP (last 3 results) No results for input(s): BNP in the last 8760 hours.  ProBNP (last 3 results) No results for input(s): PROBNP in the last 8760 hours.  CBG:  Recent Labs Lab 02/22/15 1723  GLUCAP 92    Radiological Exams on Admission: No results found.   EKG: Independently reviewed. Normal sinus Rhythm rate = 83     Assessment/Plan:      64 y.o. male with  Principal Problem:   1.    Hepatic encephalopathy (HCC)   Lactulose TID x 2 days   Monitor Ammonia Levels   Active Problems:   2.    Chronic hepatitis C with cirrhosis (HCC)   Chronic      3.    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    Liver cancer ([place])   On Nexavar Rx BID     5.    Diabetes type 2, controlled ([place])   SSI Coverage PRN   Check HbA1C     6.    DVT Prophylaxis   SCDs    Code Status:     FULL CODE       Family Communication:   No Family Present    Disposition Plan:    Inpatient  Status        Time spent:  [facility] Pager [phone]   If [place] Please Contact the Day Rounding Team MD for [facility]  If 7PM-7AM, Please Contact Night-Floor Coverage  [email or web address] Password [facility] 02/22/2015, 11:39 PM     ADDENDUM:   Patient was seen and examined on 02/22/2015

## 2015-02-22 NOTE — ED Notes (Signed)
Pt has liver cancer and takes lactulose to keep ammonia levels down, but has not had today.  Per family patient is a little more altered, responsive, slow with responses

## 2015-02-22 NOTE — ED Provider Notes (Signed)
CSN: [id number]     Arrival date & time 02/22/15  1603 History   First MD Initiated Contact with Patient 02/22/15 2115     Chief Complaint  Patient presents with  . Dizziness  . Weakness  . Altered Mental Status     (Consider location/radiation/quality/duration/timing/severity/associated sxs/prior Treatment) HPI Comments: Patient with a history of insulin dependent DM, HTN, chronic hepatitis C with cirrhosis, liver cancer presents with altered mental status noticed today by his brother. The patient lives alone. He denies falls, pain or injury, vomiting. He states he feels "loopy" and reports he has not taken any of him medications today.No recent illnesses or known fever. He was with family yesterday for Thanksgiving and brother reports he ate well and appeared to be in good health. Brother reports he is acting confused, and speech is slowed, similar to the last time his ammonia level was increased.  Patient is a 65 y.o. male presenting with weakness and altered mental status. The history is provided by the patient. No language interpreter was used.  Weakness Associated symptoms include weakness. Pertinent negatives include no chills, congestion, coughing, fever, myalgias, nausea, rash or sore throat.  Altered Mental Status Presenting symptoms: confusion   Associated symptoms: weakness   Associated symptoms: no fever, no nausea and no rash     Past Medical History  Diagnosis Date  . Macrocytosis   . Thrombocytopenia ([place])   . Esophageal bleed, non-variceal 2006  . HTN (hypertension)   . Psoriasis   . Mitral valve prolapse     OCCAS FLUTTER FEELING  . Vitamin D deficiency   . Umbilical hernia     OCCAS DISCOMFORT  . Prostate enlargement     PT TOLD "NORMAL FOR AGE" - TAKES FINASTERIDE AND FLOMAX  . Heart murmur   . Type II diabetes mellitus ([place])   . Rheumatic fever 1950's  . Hepatitis C 1980's  . Arthritis     "joint stiffness in the knees" (11/10/2013)  . Liver cancer  ([place])   . Ascites    Past Surgical History  Procedure Laterality Date  . Tonsilectomy, adenoidectomy, bilateral myringotomy and tubes  child  . Umbilical hernia repair  03/09/2012    Procedure: HERNIA REPAIR UMBILICAL ADULT;  Surgeon: Edward Jolly, MD;  Location: [facility];  Service: General;  Laterality: N/A;  Repair Umbilical Hernia with Mesh  . Insertion of mesh  03/09/2012    Procedure: INSERTION OF MESH;  Surgeon: Edward Jolly, MD;  Location: [facility];  Service: General;  Laterality: N/A;  . Inguinal hernia repair Left 1970's  . Hernia repair    . Tonsillectomy     Family History  Problem Relation Age of Onset  . Prostate cancer Brother   . COPD Mother   . Heart disease Father   . Diabetes Father   . Alcohol abuse Father   . Liver cancer Brother   . Colon cancer Brother   . COPD Maternal Grandfather    Social History  Substance Use Topics  . Smoking status: Former Smoker -- 20 years    Types: Cigars    Start date: 12/22/2005  . Smokeless tobacco: Former Systems developer    Types: Snuff     Comment: "quit smoking ~ 2009; quit snuff in ~ 2013"  . Alcohol Use: Yes     Comment: "quit in late April 2015"    Review of Systems  Constitutional: Negative for fever and chills.  HENT: Negative.  Negative for congestion and sore throat.  Respiratory: Negative.  Negative for cough and shortness of breath.   Cardiovascular: Negative.   Gastrointestinal: Positive for diarrhea. Negative for nausea.  Musculoskeletal: Negative.  Negative for myalgias and neck stiffness.  Skin: Negative.  Negative for rash.  Neurological: Positive for dizziness and weakness.  Psychiatric/Behavioral: Positive for confusion.      Allergies  Review of patient's allergies indicates no known allergies.  Home Medications   Prior to Admission medications   Medication Sig Start Date End Date Taking? Authorizing Provider  Carboxymethylcellul-Glycerin (CLEAR EYES FOR DRY EYES) 1-0.25 % SOLN Apply 1-2  drops to eye daily as needed. For dry eyes    Historical Provider, MD  dicyclomine (BENTYL) 10 MG capsule Take 1 capsule (10 mg total) by mouth 2 (two) times daily. 11/13/13   Ripudeep Krystal Eaton, MD  furosemide (LASIX) 40 MG tablet Take 40 mg by mouth daily.    Historical Provider, MD  insulin glargine (LANTUS) 100 UNIT/ML injection Inject 0.08 mLs (8 Units total) into the skin daily. 04/04/14   Shanker Kristeen Mans, MD  lactulose (CHRONULAC) 10 GM/15ML solution Take 30 mLs (20 g total) by mouth 2 (two) times daily. Titrate for 2-3soft stools/day 08/18/14   Domenic Polite, MD  nadolol (CORGARD) 20 MG tablet Take 20 mg by mouth daily.    Historical Provider, MD  ondansetron (ZOFRAN ODT) 4 MG disintegrating tablet Take 1 tablet (4 mg total) by mouth every 8 (eight) hours as needed for nausea or vomiting. Patient not taking: Reported on 08/16/2014 11/13/13   Ripudeep Krystal Eaton, MD  oxyCODONE (OXY IR/ROXICODONE) 5 MG immediate release tablet Take 1 tablet (5 mg total) by mouth every 12 (twelve) hours as needed for severe pain or breakthrough pain (only for severe or breakthrough pain). Patient taking differently: Take 5 mg by mouth every 4 (four) hours as needed (pain).  11/13/13   Ripudeep Krystal Eaton, MD  prochlorperazine (COMPAZINE) 10 MG tablet Take 10 mg by mouth every 6 (six) hours as needed for nausea or vomiting.    Historical Provider, MD  SORAfenib (NEXAVAR) 200 MG tablet Take 400 mg by mouth every 12 (twelve) hours. Take on an empty stomach 1 hour before or 2 hours after meals.    Historical Provider, MD  spironolactone (ALDACTONE) 25 MG tablet Take 1 tablet (25 mg total) by mouth daily. 04/04/14   Shanker Kristeen Mans, MD  Tamsulosin HCl (FLOMAX) 0.4 MG CAPS Take 0.4 mg by mouth daily.    Historical Provider, MD  Vitamin D, Ergocalciferol, (DRISDOL) 50000 UNITS CAPS Take 50,000 Units by mouth every Friday.    Historical Provider, MD  zolpidem (AMBIEN) 10 MG tablet Take 10 mg by mouth at bedtime.     Historical Provider,  MD   BP 98/69 mmHg  Pulse 82  Temp(Src) 98.5 F (36.9 C) (Oral)  Resp 18  SpO2 96% Physical Exam  Constitutional: He is oriented to person, place, and time. He appears well-developed.  Cachectic appearing male.  HENT:  Head: Normocephalic.  Mouth/Throat: Mucous membranes are dry.  Eyes: No scleral icterus.  Neck: Normal range of motion. Neck supple.  Cardiovascular: Normal rate and regular rhythm.   No murmur heard. Pulmonary/Chest: Effort normal and breath sounds normal. He has no wheezes. He has no rales.  Abdominal: Soft. Bowel sounds are normal. There is no tenderness. There is no rebound and no guarding.  Musculoskeletal: Normal range of motion. He exhibits no edema.  Neurological: He is alert and oriented to person, place, and time.  Skin: Skin is warm and dry. No rash noted.  Psychiatric: He has a normal mood and affect.    ED Course  Procedures (including critical care time) Labs Review Labs Reviewed  COMPREHENSIVE METABOLIC PANEL - Abnormal; Notable for the following:    Glucose, Bld 103 (*)    Calcium 8.7 (*)    Albumin 3.0 (*)    AST 59 (*)    Total Bilirubin 3.1 (*)    All other components within normal limits  CBC - Abnormal; Notable for the following:    RBC 4.21 (*)    MCV 103.6 (*)    MCH 37.8 (*)    MCHC 36.5 (*)    Platelets 66 (*)    All other components within normal limits  AMMONIA - Abnormal; Notable for the following:    Ammonia 142 (*)    All other components within normal limits  URINALYSIS, ROUTINE W REFLEX MICROSCOPIC (NOT AT [facility])  CBG MONITORING, ED  CBG MONITORING, ED   Results for orders placed or performed during the hospital encounter of 02/22/15  Comprehensive metabolic panel  Result Value Ref Range   Sodium 135 135 - 145 mmol/L   Potassium 5.0 3.5 - 5.1 mmol/L   Chloride 102 101 - 111 mmol/L   CO2 25 22 - 32 mmol/L   Glucose, Bld 103 (H) 65 - 99 mg/dL   BUN 19 6 - 20 mg/dL   Creatinine, Ser 0.97 0.61 - 1.24 mg/dL    Calcium 8.7 (L) 8.9 - 10.3 mg/dL   Total Protein 7.0 6.5 - 8.1 g/dL   Albumin 3.0 (L) 3.5 - 5.0 g/dL   AST 59 (H) 15 - 41 U/L   ALT 40 17 - 63 U/L   Alkaline Phosphatase 80 38 - 126 U/L   Total Bilirubin 3.1 (H) 0.3 - 1.2 mg/dL   GFR calc non Af Amer >60 >60 mL/min   GFR calc Af Amer >60 >60 mL/min   Anion gap 8 5 - 15  CBC  Result Value Ref Range   WBC 5.1 4.0 - 10.5 K/uL   RBC 4.21 (L) 4.22 - 5.81 MIL/uL   Hemoglobin 15.9 13.0 - 17.0 g/dL   HCT 43.6 39.0 - 52.0 %   MCV 103.6 (H) 78.0 - 100.0 fL   MCH 37.8 (H) 26.0 - 34.0 pg   MCHC 36.5 (H) 30.0 - 36.0 g/dL   RDW 14.1 11.5 - 15.5 %   Platelets 66 (L) 150 - 400 K/uL  Ammonia  Result Value Ref Range   Ammonia 142 (H) 9 - 35 umol/L  CBG monitoring, ED  Result Value Ref Range   Glucose-Capillary 92 65 - 99 mg/dL   Imaging Review No results found. I have personally reviewed and evaluated these images and lab results as part of my medical decision-making.   EKG Interpretation None      MDM   Final diagnoses:  None    1. Hepatic encephalopathy  The patient continues to be confused, ammonia elevated to 142. Blood pressure improving with IV fluids, PO hydration. UA pending, urine unavailable, bladder scan 84. Will continue to gently hydrate. Lactulose provided in ED.  Discussed admission with Dr. Arnoldo Morale, [facility], who accepts teh patient for admission.    Charlann Lange, PA-C 02/22/15 2257  Charlesetta Shanks, MD 02/25/15 1409

## 2015-02-23 LAB — GLUCOSE, CAPILLARY
GLUCOSE-CAPILLARY: 166 mg/dL — AB (ref 65–99)
GLUCOSE-CAPILLARY: 148 mg/dL — AB (ref 65–99)
Glucose-Capillary: 136 mg/dL — ABNORMAL HIGH (ref 65–99)
Glucose-Capillary: 111 mg/dL — ABNORMAL HIGH (ref 65–99)
Glucose-Capillary: 167 mg/dL — ABNORMAL HIGH (ref 65–99)

## 2015-02-23 LAB — URINE MICROSCOPIC-ADD ON: Bacteria, UA: NONE SEEN

## 2015-02-23 LAB — URINALYSIS, ROUTINE W REFLEX MICROSCOPIC
GLUCOSE, UA: NEGATIVE mg/dL
HGB URINE DIPSTICK: NEGATIVE
Ketones, ur: 15 mg/dL — AB
Nitrite: NEGATIVE
Protein, ur: NEGATIVE mg/dL
SPECIFIC GRAVITY, URINE: 1.03 (ref 1.005–1.030)
pH: 5.5 (ref 5.0–8.0)

## 2015-02-23 LAB — BASIC METABOLIC PANEL
Anion gap: 7 (ref 5–15)
BUN: 22 mg/dL — ABNORMAL HIGH (ref 6–20)
CALCIUM: 8 mg/dL — AB (ref 8.9–10.3)
CO2: 23 mmol/L (ref 22–32)
CREATININE: 1.12 mg/dL (ref 0.61–1.24)
Chloride: 108 mmol/L (ref 101–111)
GFR calc non Af Amer: >60 mL/min (ref >60)
GLUCOSE: 169 mg/dL — AB (ref 65–99)
Potassium: 4.2 mmol/L (ref 3.5–5.1)
Sodium: 138 mmol/L (ref 135–145)

## 2015-02-23 LAB — CBC
HCT: 37 % — ABNORMAL LOW (ref 39.0–52.0)
Hemoglobin: 13.2 g/dL (ref 13.0–17.0)
MCH: 37.7 pg — AB (ref 26.0–34.0)
MCHC: 35.7 g/dL (ref 30.0–36.0)
MCV: 105.7 fL — ABNORMAL HIGH (ref 78.0–100.0)
PLATELETS: 53 10*3/uL — AB (ref 150–400)
RBC: 3.5 MIL/uL — ABNORMAL LOW (ref 4.22–5.81)
RDW: 14.4 % (ref 11.5–15.5)
WBC: 4.3 10*3/uL (ref 4.0–10.5)

## 2015-02-23 LAB — AMMONIA: AMMONIA: 68 umol/L — AB (ref 9–35)

## 2015-02-23 NOTE — Progress Notes (Signed)
Pt is yet to void through the condom catheter. Bladder scan on him shows 28ml. MD notified.

## 2015-02-23 NOTE — Progress Notes (Signed)
[facility] PROGRESS NOTE  Ethan Wade [name] DOB: 16-Dec-1950 DOA: 02/22/2015 PCP: Gerrit Heck, MD  Length of stay: 1   Assessment/Plan: Principal Problem:   Hepatic encephalopathy ([place]) Active Problems:   Thrombocytopenia ([place])   Chronic hepatitis C with cirrhosis ([place])   Liver cancer ([place])   Diabetes type 2, controlled ([place])    HPI: Ethan Wade is a 64 y.o. male with a history of Hepatic Cancer, Hep C, Cirrhosis who presents to the ED with complaints of increased confusion and Lethargy today. His family reported that he was normal yesterday but when they called him today he was confused, and they found him sluggish. His brother reports that he has not been taking his lactulose regularly. In the ED, his Ammonia level was found at 142. He was administered 1 dose of lactulose in the ED and referred for admission.   Assessment and plan  Hepatic encephalopathy: still confused  Cont lactulose -Ammonia 142, now 68 , will monitor for 1 more day -most likely due to medication noncompliance -he does not have any abdominal pain, less likely to have SBP    Cirrhosis: no signs of SBP. Multiple previous paracentesis documented in the notes -continue Nadolol, spironolactone And lasix   Hypertension: Continue Lasix and spironolactone  BPH: -Continue Flomax  Liver cancer:  -Diagnosed on 8/20 15. Patient has been followed up by Dr. Lynnette Caffe[facility] at [facility]. He is currently taking Nexavar, s/p radioembolization, continue nexavar -Follow-up with Dr. Lynnette Caffey  DM-II: Last A1c 6.5 on 04/02/14. Well controlled. 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 Platelets  53, baseline in the 60s -chronic, stable     DVT prophylaxsis SCDs  Code Status:      Code Status Orders        Start     Ordered   02/22/15 2315  Full code   Continuous     02/22/15 2314     Family Communication: family  updated about patient's clinical progress Disposition Plan:  As above       Consultants:  None  Procedures:  None  Antibiotics: Anti-infectives    None         HPI/Subjective: Appears confused , keeps saying yes to everything   Objective: Filed Vitals:   02/22/15 2245 02/22/15 2313 02/23/15 0216 02/23/15 0530  BP: 105/75 110/70 102/77 94/65  Pulse: 67 67 79 74  Temp:  98.3 F (36.8 C) 99 F (37.2 C) 98.3 F (36.8 C)  TempSrc:  Oral Oral Oral  Resp: 12 18 18 18   Height:  5\' 3"  (1.6 m)    Weight:  83.9 kg (184 lb 15.5 oz)    SpO2: 99% 100% 99% 98%    Intake/Output Summary (Last 24 hours) at 02/23/15 0849 Last data filed at 02/23/15 0719  Gross per 24 hour  Intake 852.92 ml  Output      0 ml  Net 852.92 ml    Exam:  General: No acute respiratory distress Lungs: Clear to auscultation bilaterally without wheezes or crackles Cardiovascular: Regular rate and rhythm without murmur gallop or rub normal S1 and S2 Abdomen: Nontender, nondistended, soft, bowel sounds positive, no rebound, no ascites, no appreciable mass Extremities: No significant cyanosis, clubbing, or edema bilateral lower extremities     Data Review   Micro Results No results found for this or any previous visit (from the past 240 hour(s)).  Radiology Reports No results found.  CBC  Recent Labs Lab 02/22/15 1736 02/23/15 0541  WBC 5.1 4.3  HGB 15.9 13.2  HCT 43.6 37.0*  PLT 66* 53*  MCV 103.6* 105.7*  MCH 37.8* 37.7*  MCHC 36.5* 35.7  RDW 14.1 14.4    Chemistries   Recent Labs Lab 02/22/15 1736 02/23/15 0541  NA 135 138  K 5.0 4.2  CL 102 108  CO2 25 23  GLUCOSE 103* 169*  BUN 19 22*  CREATININE 0.97 1.12  CALCIUM 8.7* 8.0*  AST 59*  --   ALT 40  --   ALKPHOS 80  --   BILITOT 3.1*  --    ------------------------------------------------------------------------------------------------------------------ estimated creatinine clearance is 62.1 mL/min (by C-G  formula based on Cr of 1.12). ------------------------------------------------------------------------------------------------------------------ No results for input(s): HGBA1C in the last 72 hours. ------------------------------------------------------------------------------------------------------------------ No results for input(s): CHOL, HDL, LDLCALC, TRIG, CHOLHDL, LDLDIRECT in the last 72 hours. ------------------------------------------------------------------------------------------------------------------ No results for input(s): TSH, T4TOTAL, T3FREE, THYROIDAB in the last 72 hours.  Invalid input(s): FREET3 ------------------------------------------------------------------------------------------------------------------ No results for input(s): VITAMINB12, FOLATE, FERRITIN, TIBC, IRON, RETICCTPCT in the last 72 hours.  Coagulation profile No results for input(s): INR, PROTIME in the last 168 hours.  No results for input(s): DDIMER in the last 72 hours.  Cardiac Enzymes No results for input(s): CKMB, TROPONINI, MYOGLOBIN in the last 168 hours.  Invalid input(s): CK ------------------------------------------------------------------------------------------------------------------ Invalid input(s): POCBNP   CBG:  Recent Labs Lab 02/22/15 1723 02/23/15 0109 02/23/15 0757  GLUCAP 92 136* 111*       Studies: No results found.    Lab Results  Component Value Date   HGBA1C 6.5* 04/02/2014   HGBA1C 6.7* 11/11/2013   Lab Results  Component Value Date   CREATININE 1.12 02/23/2015       Scheduled Meds: . furosemide  40 mg Oral Daily  . insulin aspart  0-5 Units Subcutaneous QHS  . insulin aspart  0-9 Units Subcutaneous TID WC  . insulin glargine  8 Units Subcutaneous Daily  . lactulose  20 g Oral TID  . nadolol  20 mg Oral Daily  . spironolactone  25 mg Oral Daily  . tamsulosin  0.4 mg Oral Daily   Continuous Infusions: . sodium chloride 75 mL/hr at  02/23/15 [id number]    Principal Problem:   Hepatic encephalopathy ([facility]) Active Problems:   Thrombocytopenia ([facility])   Chronic hepatitis C with cirrhosis ([facility])   Liver cancer ([place])   Diabetes type 2, controlled ([place])    Time spent: 47 minutes   [facility] Pager [phone]. If 7PM-7AM, please contact night-coverage at [email or web address], password [facility] 02/23/2015, 8:49 AM  LOS: 1 day

## 2015-02-24 DIAGNOSIS — L8990 Pressure ulcer of unspecified site, unspecified stage: Secondary | ICD-10-CM | POA: Insufficient documentation

## 2015-02-24 LAB — COMPREHENSIVE METABOLIC PANEL
ALK PHOS: 68 U/L (ref 38–126)
ALT: 35 U/L (ref 17–63)
AST: 51 U/L — ABNORMAL HIGH (ref 15–41)
Albumin: 2.5 g/dL — ABNORMAL LOW (ref 3.5–5.0)
Anion gap: 9 (ref 5–15)
BUN: 20 mg/dL (ref 6–20)
CALCIUM: 8.2 mg/dL — AB (ref 8.9–10.3)
CO2: 21 mmol/L — AB (ref 22–32)
CREATININE: 0.85 mg/dL (ref 0.61–1.24)
Chloride: 106 mmol/L (ref 101–111)
Glucose, Bld: 116 mg/dL — ABNORMAL HIGH (ref 65–99)
Potassium: 3.9 mmol/L (ref 3.5–5.1)
Sodium: 136 mmol/L (ref 135–145)
Total Bilirubin: 1.9 mg/dL — ABNORMAL HIGH (ref 0.3–1.2)
Total Protein: 6 g/dL — ABNORMAL LOW (ref 6.5–8.1)

## 2015-02-24 LAB — CBC
HCT: 38.7 % — ABNORMAL LOW (ref 39.0–52.0)
HEMOGLOBIN: 13.9 g/dL (ref 13.0–17.0)
MCH: 37.6 pg — AB (ref 26.0–34.0)
MCHC: 35.9 g/dL (ref 30.0–36.0)
MCV: 104.6 fL — AB (ref 78.0–100.0)
Platelets: 55 10*3/uL — ABNORMAL LOW (ref 150–400)
RBC: 3.7 MIL/uL — AB (ref 4.22–5.81)
RDW: 14.2 % (ref 11.5–15.5)
WBC: 5.2 10*3/uL (ref 4.0–10.5)

## 2015-02-24 LAB — GLUCOSE, CAPILLARY
GLUCOSE-CAPILLARY: 135 mg/dL — AB (ref 65–99)
GLUCOSE-CAPILLARY: 185 mg/dL — AB (ref 65–99)
GLUCOSE-CAPILLARY: 222 mg/dL — AB (ref 65–99)
Glucose-Capillary: 189 mg/dL — ABNORMAL HIGH (ref 65–99)

## 2015-02-24 LAB — AMMONIA: Ammonia: 122 umol/L — ABNORMAL HIGH (ref 9–35)

## 2015-02-24 MED ORDER — LACTULOSE 10 GM/15ML PO SOLN
30.0000 g | Freq: Three times a day (TID) | ORAL | Status: AC
  Administered 2015-02-24: 30 g via ORAL
  Filled 2015-02-24: qty 45

## 2015-02-24 MED ORDER — DEXTROSE 5 % IV SOLN
2.0000 g | INTRAVENOUS | Status: DC
  Administered 2015-02-24: 2 g via INTRAVENOUS
  Filled 2015-02-24 (×2): qty 2

## 2015-02-24 MED ORDER — SORAFENIB TOSYLATE 200 MG PO TABS: 400.0000 mg | ORAL_TABLET | Freq: Two times a day (BID) | ORAL | Status: DC

## 2015-02-24 NOTE — Progress Notes (Signed)
Utilization review completed.  

## 2015-02-24 NOTE — Progress Notes (Signed)
Nursing note  Patient ambulated in hallway with spouse this AM, tolerated well will continue to monitor patient. Carmita Boom, Bettina Gavia RN

## 2015-02-24 NOTE — Evaluation (Signed)
Physical Therapy Evaluation Patient Details Name: Ethan Wade MRN: [id number] DOB: 1950/12/03 Today's Date: 02/24/2015   History of Present Illness  Patient is a 64 yo male admitted 02/22/15 with confusion.  Patient with hepatic encephalopathy.  PMH:  Hepatic CA, Hep C, cirrhosis, HTN, DM, arthritis, polysubstance abuse  Clinical Impression  Patient presents with problems below.  Will benefit from acute PT to maximize functional mobility prior to discharge.  Patient with decreased balance, safety awareness, and cognition.  Concerned about patient being at home alone.  Would recommend 24 hour assist/supervision at home.  Otherwise may need ST-SNF at discharge for continued therapy.    Follow Up Recommendations Supervision/Assistance - 24 hour (Unsure)    Equipment Recommendations  None recommended by PT    Recommendations for Other Services       Precautions / Restrictions Precautions Precautions: Fall Restrictions Weight Bearing Restrictions: No      Mobility  Bed Mobility Overal bed mobility: Needs Assistance Bed Mobility: Supine to Sit;Sit to Supine     Supine to sit: Min guard Sit to supine: Min guard   General bed mobility comments: Verbal cues for technique.  Assist for safety.  Transfers Overall transfer level: Needs assistance Equipment used: None Transfers: Sit to/from Stand Sit to Stand: Min guard         General transfer comment: Verbal cues for safety during transfers.  Assist for safety/balance.  Ambulation/Gait Ambulation/Gait assistance: Min guard Ambulation Distance (Feet): 175 Feet Assistive device: None Gait Pattern/deviations: Step-through pattern;Decreased stride length Gait velocity: decreased Gait velocity interpretation: Below normal speed for age/gender General Gait Details: Patient with slightly unsteady gait.  No loss of balance.  Patient reports painful hernia is making ambulation more difficult.  Assist for safety/balance  only.  Stairs            Wheelchair Mobility    Modified Rankin (Stroke Patients Only)       Balance                                             Pertinent Vitals/Pain Pain Assessment: Faces Faces Pain Scale: Hurts little more Pain Location: "Hernia" Pain Descriptors / Indicators: Discomfort;Sore Pain Intervention(s): Monitored during session;Repositioned    Home Living Family/patient expects to be discharged to:: Private residence Living Arrangements: Alone Available Help at Discharge: Family;Available PRN/intermittently Type of Home: House Home Access: Stairs to enter Entrance Stairs-Rails: Right;Left;Can reach both Entrance Stairs-Number of Steps: 3 Home Layout: One level Home Equipment: Cane - single point;Bedside commode      Prior Function Level of Independence: Independent         Comments: Reports he drives to get groceries/errands     Hand Dominance   Dominant Hand: Right    Extremity/Trunk Assessment   Upper Extremity Assessment: Overall WFL for tasks assessed           Lower Extremity Assessment: Generalized weakness         Communication   Communication: No difficulties  Cognition Arousal/Alertness: Awake/alert Behavior During Therapy: Impulsive Overall Cognitive Status: Impaired/Different from baseline Area of Impairment: Orientation;Memory;Safety/judgement Orientation Level: Disoriented to;Time   Memory: Decreased short-term memory   Safety/Judgement: Decreased awareness of deficits;Decreased awareness of safety          General Comments      Exercises        Assessment/Plan    PT Assessment  Patient needs continued PT services  PT Diagnosis Abnormality of gait;Generalized weakness;Acute pain;Altered mental status   PT Problem List Decreased strength;Decreased activity tolerance;Decreased balance;Decreased mobility;Decreased cognition;Decreased safety awareness;Pain  PT Treatment  Interventions DME instruction;Gait training;Stair training;Functional mobility training;Therapeutic activities;Balance training;Cognitive remediation;Patient/family education   PT Goals (Current goals can be found in the Care Plan section) Acute Rehab PT Goals Patient Stated Goal: To decrease pain/take care of hernia PT Goal Formulation: With patient Time For Goal Achievement: 03/03/15 Potential to Achieve Goals: Good    Frequency Min 3X/week   Barriers to discharge Decreased caregiver support Patient reports he lives alone.    Co-evaluation               End of Session Equipment Utilized During Treatment: Gait belt Activity Tolerance: Patient tolerated treatment well;Patient limited by pain Patient left: in bed;with call bell/phone within reach;with bed alarm set (Declined to sit in chair) Nurse Communication: Mobility status (Safety issues)         Time: [id number] PT Time Calculation (min) (ACUTE ONLY): 11 min   Charges:   PT Evaluation $Initial PT Evaluation Tier I: 1 Procedure     PT G CodesDespina Pole 03-12-2015, 3:00 PM Carita Pian. Sanjuana Kava, [place] Pager [phone]

## 2015-02-24 NOTE — Progress Notes (Signed)
[facility] PROGRESS NOTE  Ethan Wade [id number] DOB: 05-Dec-1950 DOA: 02/22/2015 PCP: Ethan Heck, MD  Length of stay: 2   Assessment/Plan: Principal Problem:   Hepatic encephalopathy ([place]) Active Problems:   Thrombocytopenia ([place])   Chronic hepatitis C with cirrhosis ([place])   Liver cancer ([place])   Diabetes type 2, controlled ([place])   Pressure ulcer    HPI: Ethan Wade is a 64 y.o. male with a history of Hepatic Cancer, Hep C, Cirrhosis who presents to the ED with complaints of increased confusion and Lethargy today. His family reported that he was normal yesterday but when they called him today he was confused, and they found him sluggish. His brother reports that he has not been taking his lactulose regularly. In the ED, his Ammonia level was found at 142. He was administered 1 dose of lactulose in the ED and referred for admission.   Assessment and plan  Hepatic encephalopathy: still confused  Increase  Lactulose,  -Ammonia 142, now 68 , up to 122  -he does not have any abdominal pain, less likely to have SBP  give slow improvement ,r/o sbp,  usg guided paracentesis today  Check TSH  Start empiric rocephin    Cirrhosis: may have SBP. Multiple previous paracentesis documented in the notes -continue Nadolol, spironolactone And lasix   Hypertension: Continue Lasix and spironolactone  BPH: -Continue Flomax  Liver cancer:  -Diagnosed on 8/20 15. Patient has been followed up by Dr. Lynnette Wade at [facility]. He is currently taking Nexavar, s/p radioembolization, continue nexavar -Follow-up with Dr. Lynnette Wade  DM-II: Last A1c 6.5 on 04/02/14. Well controlled. 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 Platelets  53, baseline in the 60s -chronic, stable     DVT prophylaxsis SCDs  Code Status:      Code Status Orders        Start     Ordered   02/22/15 2315  Full code    Continuous     02/22/15 2314     Family Communication: family updated about patient's clinical progress Disposition Plan:  As above       Consultants:  None  Procedures:  None  Antibiotics: Anti-infectives    None         HPI/Subjective: BP soft, confused   Objective: Filed Vitals:   02/23/15 2025 02/24/15 0009 02/24/15 0436 02/24/15 0805  BP: 102/69 112/74 104/66 92/54  Pulse: 73 75 82 77  Temp: 98.2 F (36.8 C) 98.3 F (36.8 C) 98.3 F (36.8 C)   TempSrc: Oral Oral Oral   Resp: 16 16 16 23   Height:      Weight:      SpO2: 100% 100% 98% 98%    Intake/Output Summary (Last 24 hours) at 02/24/15 1201 Last data filed at 02/24/15 0437  Gross per 24 hour  Intake    360 ml  Output    100 ml  Net    260 ml    Exam:  General: No acute respiratory distress Lungs: Clear to auscultation bilaterally without wheezes or crackles Cardiovascular: Regular rate and rhythm without murmur gallop or rub normal S1 and S2 Abdomen: Nontender, nondistended, soft, bowel sounds positive, no rebound, no ascites, no appreciable mass Extremities: No significant cyanosis, clubbing, or edema bilateral lower extremities     Data Review   Micro Results No results found for this or any previous visit (from the past 240 hour(s)).  Radiology  Reports No results found.   CBC  Recent Labs Lab 02/22/15 1736 02/23/15 0541 02/24/15 0500  WBC 5.1 4.3 5.2  HGB 15.9 13.2 13.9  HCT 43.6 37.0* 38.7*  PLT 66* 53* 55*  MCV 103.6* 105.7* 104.6*  MCH 37.8* 37.7* 37.6*  MCHC 36.5* 35.7 35.9  RDW 14.1 14.4 14.2    Chemistries   Recent Labs Lab 02/22/15 1736 02/23/15 0541 02/24/15 0500  NA 135 138 136  K 5.0 4.2 3.9  CL 102 108 106  CO2 25 23 21*  GLUCOSE 103* 169* 116*  BUN 19 22* 20  CREATININE 0.97 1.12 0.85  CALCIUM 8.7* 8.0* 8.2*  AST 59*  --  51*  ALT 40  --  35  ALKPHOS 80  --  68  BILITOT 3.1*  --  1.9*    ------------------------------------------------------------------------------------------------------------------ estimated creatinine clearance is 81.9 mL/min (by C-G formula based on Cr of 0.85). ------------------------------------------------------------------------------------------------------------------ No results for input(s): HGBA1C in the last 72 hours. ------------------------------------------------------------------------------------------------------------------ No results for input(s): CHOL, HDL, LDLCALC, TRIG, CHOLHDL, LDLDIRECT in the last 72 hours. ------------------------------------------------------------------------------------------------------------------ No results for input(s): TSH, T4TOTAL, T3FREE, THYROIDAB in the last 72 hours.  Invalid input(s): FREET3 ------------------------------------------------------------------------------------------------------------------ No results for input(s): VITAMINB12, FOLATE, FERRITIN, TIBC, IRON, RETICCTPCT in the last 72 hours.  Coagulation profile No results for input(s): INR, PROTIME in the last 168 hours.  No results for input(s): DDIMER in the last 72 hours.  Cardiac Enzymes No results for input(s): CKMB, TROPONINI, MYOGLOBIN in the last 168 hours.  Invalid input(s): CK ------------------------------------------------------------------------------------------------------------------ Invalid input(s): POCBNP   CBG:  Recent Labs Lab 02/23/15 0757 02/23/15 1147 02/23/15 1651 02/23/15 2024 02/24/15 0805  GLUCAP 111* 166* 148* 167* 135*       Studies: No results found.    Lab Results  Component Value Date   HGBA1C 6.5* 04/02/2014   HGBA1C 6.7* 11/11/2013   Lab Results  Component Value Date   CREATININE 0.85 02/24/2015       Scheduled Meds: . furosemide  40 mg Oral Daily  . insulin aspart  0-5 Units Subcutaneous QHS  . insulin aspart  0-9 Units Subcutaneous TID WC  . insulin glargine  8  Units Subcutaneous Daily  . lactulose  20 g Oral TID  . nadolol  20 mg Oral Daily  . spironolactone  25 mg Oral Daily  . tamsulosin  0.4 mg Oral Daily   Continuous Infusions:    Principal Problem:   Hepatic encephalopathy (H[facility]) Active Problems:   Thrombocytopenia (H[facility])   Chronic hepatitis C with cirrhosis (H[facility])   Liver cancer ([place])   Diabetes type 2, controlled ([place])   Pressure ulcer    Time spent: 45 minutes   [facility] Pager [phone]. If 7PM-7AM, please contact night-coverage at [email or web address], password [facility] 02/24/2015, 12:01 PM  LOS: 2 days

## 2015-02-25 ENCOUNTER — Inpatient Hospital Stay (HOSPITAL_COMMUNITY): Payer: Medicare Other

## 2015-02-25 LAB — COMPREHENSIVE METABOLIC PANEL
ALK PHOS: 63 U/L (ref 38–126)
ALT: 34 U/L (ref 17–63)
ANION GAP: 6 (ref 5–15)
AST: 50 U/L — ABNORMAL HIGH (ref 15–41)
Albumin: 2.5 g/dL — ABNORMAL LOW (ref 3.5–5.0)
BILIRUBIN TOTAL: 1.8 mg/dL — AB (ref 0.3–1.2)
BUN: 15 mg/dL (ref 6–20)
CALCIUM: 8.3 mg/dL — AB (ref 8.9–10.3)
CO2: 23 mmol/L (ref 22–32)
CREATININE: 0.77 mg/dL (ref 0.61–1.24)
Chloride: 109 mmol/L (ref 101–111)
Glucose, Bld: 125 mg/dL — ABNORMAL HIGH (ref 65–99)
Potassium: 3.9 mmol/L (ref 3.5–5.1)
SODIUM: 138 mmol/L (ref 135–145)
TOTAL PROTEIN: 6 g/dL — AB (ref 6.5–8.1)

## 2015-02-25 LAB — CBC
HCT: 39.5 % (ref 39.0–52.0)
HEMOGLOBIN: 14 g/dL (ref 13.0–17.0)
MCH: 37.1 pg — AB (ref 26.0–34.0)
MCHC: 35.4 g/dL (ref 30.0–36.0)
MCV: 104.8 fL — AB (ref 78.0–100.0)
Platelets: 52 10*3/uL — ABNORMAL LOW (ref 150–400)
RBC: 3.77 MIL/uL — AB (ref 4.22–5.81)
RDW: 14.1 % (ref 11.5–15.5)
WBC: 5.3 10*3/uL (ref 4.0–10.5)

## 2015-02-25 LAB — AMMONIA: Ammonia: 116 umol/L — ABNORMAL HIGH (ref 9–35)

## 2015-02-25 LAB — GLUCOSE, CAPILLARY
GLUCOSE-CAPILLARY: 132 mg/dL — AB (ref 65–99)
Glucose-Capillary: 204 mg/dL — ABNORMAL HIGH (ref 65–99)

## 2015-02-25 LAB — TSH: TSH: 4.011 u[IU]/mL (ref 0.350–4.500)

## 2015-02-25 LAB — HEMOGLOBIN A1C
HEMOGLOBIN A1C: 6.1 % — AB (ref 4.8–5.6)
MEAN PLASMA GLUCOSE: 128 mg/dL

## 2015-02-25 MED ORDER — LACTULOSE 10 GM/15ML PO SOLN: 30.0000 g | Freq: Three times a day (TID) | ORAL | Status: DC

## 2015-02-25 MED ORDER — ALBUMIN HUMAN 25 % IV SOLN: 25.0000 g | Freq: Once | INTRAVENOUS | Status: DC

## 2015-02-25 NOTE — Progress Notes (Signed)
Ethan Wade to be D/C'd Home per MD order.  Discussed with the patient and all questions fully answered.  VSS. IV catheter discontinued intact. Site without signs and symptoms of complications. Dressing and pressure applied.  An After Visit Summary was printed and given to the patient. Patient received prescription.  D/c education completed with patient/family including follow up instructions, medication list, d/c activities limitations if indicated, with other d/c instructions as indicated by MD - patient able to verbalize understanding, all questions fully answered.   Patient instructed to return to ED, call 911, or call MD for any changes in condition.   Patient escorted via [place], and D/C home via private auto.  L'ESPERANCE, [name] C 02/25/2015 12:06 PM

## 2015-02-25 NOTE — Progress Notes (Signed)
Inpatient Diabetes Program Recommendations  AACE/ADA: New Consensus Statement on Inpatient Glycemic Control (2015)  Target Ranges:  Prepandial:   less than 140 mg/dL      Peak postprandial:   less than 180 mg/dL (1-2 hours)      Critically ill patients:  140 - 180 mg/dL   Review of Glycemic Control: Results for CRANDALL, ARIANO (MRN [id number]) as of 02/25/2015 10:19  Ref. Range 02/24/2015 08:05 02/24/2015 12:27 02/24/2015 17:02 02/24/2015 21:21 02/25/2015 07:36  Glucose-Capillary Latest Ref Range: 65-99 mg/dL 135 (H) 185 (H) 222 (H) 189 (H) 132 (H)   Results for DEPAUL, JACKOVICH (MRN [id number]) as of 02/25/2015 10:19  Ref. Range 02/23/2015 05:41  Hemoglobin A1C Latest Ref Range: 4.8-5.6 % 6.1 (H)   Diabetes history:  Type 2 diabetes Outpatient Diabetes medications: Lantus 8 units daily Current orders for Inpatient glycemic control:  Lantus 8 units daily, Novolog sensitive tid with meals and HS  Inpatient Diabetes Program Recommendations:   Consider increasing Novolog correction to moderate tid with meals.  Thanks, Adah Perl, RN, BC-ADM Inpatient Diabetes Coordinator Pager [phone] (8a-5p)

## 2015-02-25 NOTE — Discharge Summary (Signed)
Physician Discharge Summary  Claudy Abdallah MRN: [id number] DOB/AGE: 1950-05-20 64 y.o.  PCP: Gerrit Heck, MD   Admit date: 02/22/2015 Discharge date: 02/25/2015  Discharge Diagnoses:     Principal Problem:   Hepatic encephalopathy [facility]) Active Problems:   Thrombocytopenia ([place])   Chronic hepatitis C with cirrhosis (HCC)   Liver cancer ([place])   Diabetes type 2, controlled ([place])   Pressure ulcer  medication noncompliance   Follow-up recommendations Follow-up with PCP in 3-5 days , including all  additional recommended appointments as below Follow-up CBC, CMP, magnesium, ammonia in 3-5 days      Medication List    STOP taking these medications        zolpidem 10 MG tablet  Commonly known as:  AMBIEN      TAKE these medications        CLEAR EYES FOR DRY EYES 1-0.25 % Soln  Generic drug:  Carboxymethylcellul-Glycerin  Apply 1-2 drops to eye daily as needed. For dry eyes     dicyclomine 10 MG capsule  Commonly known as:  BENTYL  Take 1 capsule (10 mg total) by mouth 2 (two) times daily.     furosemide 40 MG tablet  Commonly known as:  LASIX  Take 40 mg by mouth daily.     insulin glargine 100 UNIT/ML injection  Commonly known as:  LANTUS  Inject 0.08 mLs (8 Units total) into the skin daily.     lactulose 10 GM/15ML solution  Commonly known as:  CHRONULAC  Take 45 mLs (30 g total) by mouth 3 (three) times daily.     nadolol 20 MG tablet  Commonly known as:  CORGARD  Take 20 mg by mouth daily.     ondansetron 4 MG disintegrating tablet  Commonly known as:  ZOFRAN ODT  Take 1 tablet (4 mg total) by mouth every 8 (eight) hours as needed for nausea or vomiting.     oxyCODONE 5 MG immediate release tablet  Commonly known as:  Oxy IR/ROXICODONE  Take 1 tablet (5 mg total) by mouth every 12 (twelve) hours as needed for severe pain or breakthrough pain (only for severe or breakthrough pain).     prochlorperazine 10 MG tablet  Commonly known as:   COMPAZINE  Take 10 mg by mouth every 6 (six) hours as needed for nausea or vomiting.     SORAfenib 200 MG tablet  Commonly known as:  NEXAVAR  Take 400 mg by mouth every 12 (twelve) hours. Take on an empty stomach 1 hour before or 2 hours after meals.     spironolactone 25 MG tablet  Commonly known as:  ALDACTONE  Take 1 tablet (25 mg total) by mouth daily.     tamsulosin 0.4 MG Caps capsule  Commonly known as:  FLOMAX  Take 0.4 mg by mouth daily.     Vitamin D (Ergocalciferol) 50000 UNITS Caps capsule  Commonly known as:  DRISDOL  Take 50,000 Units by mouth every Friday.         Discharge Condition: Stable   Discharge Instructions       Discharge Instructions    Diet - low sodium heart healthy    Complete by:  As directed      Increase activity slowly    Complete by:  As directed            No Known Allergies    Disposition: 01-Home or Self Care   Consults:  None  Significant Diagnostic Studies:  US Abdomen  Limited  02/25/2015  CLINICAL DATA:  64 year old male with cirrhosis and recurrent ascites. Being considered for paracentesis. Subsequent encounter. EXAM: LIMITED ABDOMEN ULTRASOUND FOR ASCITES TECHNIQUE: Limited ultrasound survey for ascites was performed in all four abdominal quadrants. COMPARISON:  11/12/2014. FINDINGS: Negative for free fluid in all 4 quadrants today. IMPRESSION: No ascites at this time. Electronically Signed   By: Genevie Ann M.D.   On: 02/25/2015 10:44       Filed Weights   02/22/15 2313  Weight: 83.9 kg (184 lb 15.5 oz)     Microbiology: No results found for this or any previous visit (from the past 240 hour(s)).     Blood Culture    Component Value Date/Time   SDES BLOOD LEFT ARM 08/17/2014 0045   SDES BLOOD RIGHT ARM 08/17/2014 0045   SPECREQUEST BOTTLES DRAWN AEROBIC ONLY 10 CC 08/17/2014 0045   SPECREQUEST BOTTLES DRAWN AEROBIC AND ANAEROBIC 10 CC 08/17/2014 0045   CULT  08/17/2014 0045    NO GROWTH 5  DAYS Note: Culture results may be compromised due to an excessive volume of blood received in culture bottles. Performed at [facility]  08/17/2014 0045    NO GROWTH 5 DAYS Note: Culture results may be compromised due to an excessive volume of blood received in culture bottles. Performed at [place] 08/23/2014 FINAL 08/17/2014 0045   REPTSTATUS 08/23/2014 FINAL 08/17/2014 0045      Labs: Results for orders placed or performed during the hospital encounter of 02/22/15 (from the past 48 hour(s))  Glucose, capillary     Status: Abnormal   Collection Time: 02/23/15 11:47 AM  Result Value Ref Range   Glucose-Capillary 166 (H) 65 - 99 mg/dL  Glucose, capillary     Status: Abnormal   Collection Time: 02/23/15  4:51 PM  Result Value Ref Range   Glucose-Capillary 148 (H) 65 - 99 mg/dL  Glucose, capillary     Status: Abnormal   Collection Time: 02/23/15  8:24 PM  Result Value Ref Range   Glucose-Capillary 167 (H) 65 - 99 mg/dL   Comment 1 Notify RN    Comment 2 Document in Chart   Ammonia     Status: Abnormal   Collection Time: 02/24/15  5:00 AM  Result Value Ref Range   Ammonia 122 (H) 9 - 35 umol/L  CBC     Status: Abnormal   Collection Time: 02/24/15  5:00 AM  Result Value Ref Range   WBC 5.2 4.0 - 10.5 K/uL   RBC 3.70 (L) 4.22 - 5.81 MIL/uL   Hemoglobin 13.9 13.0 - 17.0 g/dL   HCT 38.7 (L) 39.0 - 52.0 %   MCV 104.6 (H) 78.0 - 100.0 fL   MCH 37.6 (H) 26.0 - 34.0 pg   MCHC 35.9 30.0 - 36.0 g/dL   RDW 14.2 11.5 - 15.5 %   Platelets 55 (L) 150 - 400 K/uL    Comment: CONSISTENT WITH PREVIOUS RESULT  Comprehensive metabolic panel     Status: Abnormal   Collection Time: 02/24/15  5:00 AM  Result Value Ref Range   Sodium 136 135 - 145 mmol/L   Potassium 3.9 3.5 - 5.1 mmol/L   Chloride 106 101 - 111 mmol/L   CO2 21 (L) 22 - 32 mmol/L   Glucose, Bld 116 (H) 65 - 99 mg/dL   BUN 20 6 - 20 mg/dL   Creatinine, Ser 0.85 0.61 - 1.24 mg/dL  Calcium 8.2 (L) 8.9 - 10.3 mg/dL   Total Protein 6.0 (L) 6.5 - 8.1 g/dL   Albumin 2.5 (L) 3.5 - 5.0 g/dL   AST 51 (H) 15 - 41 U/L   ALT 35 17 - 63 U/L   Alkaline Phosphatase 68 38 - 126 U/L   Total Bilirubin 1.9 (H) 0.3 - 1.2 mg/dL   GFR calc non Af Amer >60 >60 mL/min   GFR calc Af Amer >60 >60 mL/min    Comment: (NOTE) The eGFR has been calculated using the CKD EPI equation. This calculation has not been validated in all clinical situations. eGFR's persistently <60 mL/min signify possible Chronic Kidney Disease.    Anion gap 9 5 - 15  Glucose, capillary     Status: Abnormal   Collection Time: 02/24/15  8:05 AM  Result Value Ref Range   Glucose-Capillary 135 (H) 65 - 99 mg/dL  Glucose, capillary     Status: Abnormal   Collection Time: 02/24/15 12:27 PM  Result Value Ref Range   Glucose-Capillary 185 (H) 65 - 99 mg/dL  Glucose, capillary     Status: Abnormal   Collection Time: 02/24/15  5:02 PM  Result Value Ref Range   Glucose-Capillary 222 (H) 65 - 99 mg/dL  Glucose, capillary     Status: Abnormal   Collection Time: 02/24/15  9:21 PM  Result Value Ref Range   Glucose-Capillary 189 (H) 65 - 99 mg/dL  Ammonia     Status: Abnormal   Collection Time: 02/25/15  7:30 AM  Result Value Ref Range   Ammonia 116 (H) 9 - 35 umol/L  CBC     Status: Abnormal   Collection Time: 02/25/15  7:30 AM  Result Value Ref Range   WBC 5.3 4.0 - 10.5 K/uL   RBC 3.77 (L) 4.22 - 5.81 MIL/uL   Hemoglobin 14.0 13.0 - 17.0 g/dL   HCT 39.5 39.0 - 52.0 %   MCV 104.8 (H) 78.0 - 100.0 fL   MCH 37.1 (H) 26.0 - 34.0 pg   MCHC 35.4 30.0 - 36.0 g/dL   RDW 14.1 11.5 - 15.5 %   Platelets 52 (L) 150 - 400 K/uL    Comment: CONSISTENT WITH PREVIOUS RESULT  Comprehensive metabolic panel     Status: Abnormal   Collection Time: 02/25/15  7:30 AM  Result Value Ref Range   Sodium 138 135 - 145 mmol/L   Potassium 3.9 3.5 - 5.1 mmol/L   Chloride 109 101 - 111 mmol/L   CO2 23 22 - 32 mmol/L   Glucose, Bld  125 (H) 65 - 99 mg/dL   BUN 15 6 - 20 mg/dL   Creatinine, Ser 0.77 0.61 - 1.24 mg/dL   Calcium 8.3 (L) 8.9 - 10.3 mg/dL   Total Protein 6.0 (L) 6.5 - 8.1 g/dL   Albumin 2.5 (L) 3.5 - 5.0 g/dL   AST 50 (H) 15 - 41 U/L   ALT 34 17 - 63 U/L   Alkaline Phosphatase 63 38 - 126 U/L   Total Bilirubin 1.8 (H) 0.3 - 1.2 mg/dL   GFR calc non Af Amer >60 >60 mL/min   GFR calc Af Amer >60 >60 mL/min    Comment: (NOTE) The eGFR has been calculated using the CKD EPI equation. This calculation has not been validated in all clinical situations. eGFR's persistently <60 mL/min signify possible Chronic Kidney Disease.    Anion gap 6 5 - 15  TSH     Status: None  Collection Time: 02/25/15  7:30 AM  Result Value Ref Range   TSH 4.011 0.350 - 4.500 uIU/mL  Glucose, capillary     Status: Abnormal   Collection Time: 02/25/15  7:36 AM  Result Value Ref Range   Glucose-Capillary 132 (H) 65 - 99 mg/dL     Lipid Panel  No results found for: CHOL, TRIG, HDL, CHOLHDL, VLDL, LDLCALC, LDLDIRECT   Lab Results  Component Value Date   HGBA1C 6.1* 02/23/2015   HGBA1C 6.5* 04/02/2014   HGBA1C 6.7* 11/11/2013     Lab Results  Component Value Date   CREATININE 0.77 02/25/2015    HPI: Ethan Wade is a 64 y.o. male with a history of Hepatic Cancer, Hep C, Cirrhosis who presents to the ED with complaints of increased confusion and Lethargy today. His family reported that he was normal yesterday but when they called him today he was confused, and they found him sluggish. His brother reports that he has not been taking his lactulose regularly. In the ED, his Ammonia level was found at 142. He was administered 1 dose of lactulose in the ED and referred for admission.   Assessment and plan Hepatic encephalopathy: still confused  Increase Lactulose, to TID  -Ammonia 142, now 68 , now 116 -he does not have any abdominal pain, less likely to have SBP Ultrasound guided paracentesis did not show  fluid ,  TSH within normal limits DC empiric rocephin  Patient is followed by Dr. Amedeo Plenty  Cirrhosis: may have SBP. Multiple previous paracentesis documented in the notes -continue Nadolol, spironolactone And lasix   Hypertension: Continue Lasix and spironolactone  BPH: -Continue Flomax  Liver cancer:  -Diagnosed on 8/20 15. Patient has been followed up by Dr. Lynnette Caffey at [facility]. He is currently taking Nexavar, s/p radioembolization, continue nexavar -Follow-up with Dr. Lynnette Caffey  DM-II: Last A1c 6.5 on 04/02/14. Well controlled. 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 Platelets 53, baseline in the 60s -chronic, stable     Discharge Exam: *   Blood pressure 99/71, pulse 86, temperature 98 F (36.7 C), temperature source Oral, resp. rate 20, height 5' 3"  (1.6 m), weight 83.9 kg (184 lb 15.5 oz), SpO2 100 %.   General: No acute respiratory distress Lungs: Clear to auscultation bilaterally without wheezes or crackles Cardiovascular: Regular rate and rhythm without murmur gallop or rub normal S1 and S2 Abdomen: Nontender, nondistended, soft, bowel sounds positive, no rebound, no ascites, no appreciable mass Extremities: No significant cyanosis, clubbing, or edema bilateral lower extremities   Follow-up Information    Follow up with [facility].   Why:  HHRN for med management, HHPT   Contact information:   [address] [place] [address] [phone]       Follow up with Gerrit Heck, MD. Schedule an appointment as soon as possible for a visit in 3 days.   Specialty:  Family Medicine   Contact information:   [place] [address] [phone]       Signed: Reyne Dumas 02/25/2015, 11:34 AM        Time spent >45 mins

## 2015-02-25 NOTE — Evaluation (Signed)
Occupational Therapy Evaluation Patient Details Name: Ethan Wade MRN: [id number] DOB: 1951-02-27 Today's Date: 02/25/2015    History of Present Illness Patient is a 64 y.o. male admitted 02/22/15 with confusion.  Patient with hepatic encephalopathy.  PMH:  Hepatic CA, Hep C, cirrhosis, HTN, DM, arthritis, polysubstance abuse   Clinical Impression   Pt admitted with above. Education provided to pt and his brother. Recommending HHOT upon d/c and notified case manager. Pt may be discharging home today.    Follow Up Recommendations  Home health OT;Supervision/Assistance - 24 hour    Equipment Recommendations  None recommended by OT    Recommendations for Other Services       Precautions / Restrictions Precautions Precautions: Fall Restrictions Weight Bearing Restrictions: No      Mobility Bed Mobility Overal bed mobility: Modified Independent                Transfers Overall transfer level: Needs assistance Equipment used: None Transfers: Sit to/from Stand Sit to Stand: Min guard              Balance  Pt able to stand and simulate functional activity (reaching LB) with one hand supported-Min guard.                                           ADL Overall ADL's : Needs assistance/impaired             Lower Body Bathing: Set up;Supervison/ safety;Sit to/from stand Lower Body Bathing Details (indicate cue type and reason): pt simulated standing but unsure he could reach bottom of feet standing     Lower Body Dressing: Set up;Supervision/safety;Sit to/from stand   Toilet Transfer: Min guard;Ambulation (sit to stand from bed)           Functional mobility during ADLs: Min guard General ADL Comments: Recommended sitting for LB ADLs. Educated on how 3 in 1 can be positioned in tub. Explained that OT recommends 24/7 supervision. Suggested pt elevating scrotum to help with edema. Recommended pt not get in tub alone.     Vision      Perception     Praxis      Pertinent Vitals/Pain Pain Assessment: 0-10 Pain Score:  (6-7) Pain Location: scrotum Pain Descriptors / Indicators: Constant Pain Intervention(s): Monitored during session     Hand Dominance Right   Extremity/Trunk Assessment Upper Extremity Assessment Upper Extremity Assessment: Generalized weakness   Lower Extremity Assessment Lower Extremity Assessment: Defer to PT evaluation       Communication Communication Communication: No difficulties   Cognition Arousal/Alertness: Awake/alert Behavior During Therapy: WFL for tasks assessed/performed Overall Cognitive Status:  (unsure of baseline) Area of Impairment: Problem solving             Problem Solving: Slow processing General Comments: pt able to say he would call fire department with OT asking further questions   General Comments       Exercises       Shoulder Instructions      Home Living Family/patient expects to be discharged to:: Private residence Living Arrangements: Alone Available Help at Discharge: Family;Available 24 hours/day Type of Home: House Home Access: Stairs to enter [employer] of Steps: 3 Entrance Stairs-Rails: Right;Left;Can reach both Home Layout: One level     Bathroom Shower/Tub: Teacher, early years/pre: Standard     Home Equipment: [employer] -  single point;Bedside commode          Prior Functioning/Environment Level of Independence: Independent        Comments: Reports he drives to get groceries/errands    OT Diagnosis: Generalized weakness;Acute pain   OT Problem List: Increased edema;Decreased strength;Decreased knowledge of use of DME or AE;Decreased knowledge of precautions;Decreased cognition   OT Treatment/Interventions: Self-care/ADL training;DME and/or AE instruction;Balance training;Patient/family education;Cognitive remediation/compensation;Therapeutic activities;Therapeutic exercise    OT Goals(Current  goals can be found in the care plan section) Acute Rehab OT Goals Patient Stated Goal: not stated OT Goal Formulation: With patient Time For Goal Achievement: 03/04/15 Potential to Achieve Goals: Good  OT Frequency: Min 2X/week   Barriers to D/C:            Co-evaluation              End of Session Equipment Utilized During Treatment: Gait belt  Activity Tolerance: Patient tolerated treatment well Patient left: in bed;with bed alarm set;with family/visitor present   Time: [id number] OT Time Calculation (min): 13 min Charges:  OT General Charges $OT Visit: 1 Procedure OT Evaluation $Initial OT Evaluation Tier I: 1 Procedure G-CodesBenito Wade OTR/L [id number] 02/25/2015, 12:15 PM

## 2015-02-25 NOTE — Progress Notes (Signed)
CSW spoke with pt and pt brother at bedside.  They are comfortable with current plan for pt to return home with home health services.  Pt is not agreeable to facility placement unless he has no other choice.  Brother lives one mile away and checks on pt daily through phone calls and in person visits.  Plan confirmed with MD for return home and CSW confirmed plan with RNCM as well  CSW signing off  Domenica Reamer, [place] Worker [phone]

## 2015-02-25 NOTE — Progress Notes (Addendum)
[facility] PROGRESS NOTE  Ethan Wade [id number] DOB: 11/07/1950 DOA: 02/22/2015 PCP: Gerrit Heck, MD  Length of stay: 3   Assessment/Plan: Principal Problem:   Hepatic encephalopathy ([place]) Active Problems:   Thrombocytopenia ([place])   Chronic hepatitis C with cirrhosis ([place])   Liver cancer ([place])   Diabetes type 2, controlled ([place])   Pressure ulcer    HPI: Ethan Wade is a 64 y.o. male with a history of Hepatic Cancer, Hep C, Cirrhosis who presents to the ED with complaints of increased confusion and Lethargy today. His family reported that he was normal yesterday but when they called him today he was confused, and they found him sluggish. His brother reports that he has not been taking his lactulose regularly. In the ED, his Ammonia level was found at 142. He was administered 1 dose of lactulose in the ED and referred for admission.   Assessment and plan Hepatic encephalopathy: still confused  Increase  Lactulose, to TID  -Ammonia 142, now 68 , now 116 -he does not have any abdominal pain, less likely to have SBP  give slow improvement ,r/o sbp,  usg guided paracentesis did not show fluid ,   TSH within normal limits DC  empiric rocephin    Cirrhosis: may have SBP. Multiple previous paracentesis documented in the notes -continue Nadolol, spironolactone And lasix   Hypertension: Continue Lasix and spironolactone  BPH: -Continue Flomax  Liver cancer:  -Diagnosed on 8/20 15. Patient has been followed up by Dr. Lynnette Caffey at [facility]. He is currently taking Nexavar, s/p radioembolization, continue nexavar -Follow-up with Dr. Lynnette Caffey  DM-II: Last A1c 6.5 on 04/02/14. Well controlled. 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 Platelets  53, baseline in the 60s -chronic, stable     DVT prophylaxsis SCDs  Code Status:      Code Status Orders        Start     Ordered    02/22/15 2315  Full code   Continuous     02/22/15 2314     Family Communication: family updated about patient's clinical progress Disposition Plan:  To be determined, may need SNF       Consultants:  None  Procedures:  None  Antibiotics: Anti-infectives    Start     Dose/Rate Route Frequency Ordered Stop   02/24/15 1215  cefTRIAXone (ROCEPHIN) 2 g in dextrose 5 % 50 mL IVPB     2 g 100 mL/hr over 30 Minutes Intravenous Every 24 hours 02/24/15 1206           HPI/Subjective: BP soft, confused   Objective: Filed Vitals:   02/24/15 2041 02/25/15 0013 02/25/15 0412 02/25/15 1006  BP: 102/71 106/73 104/75 99/71  Pulse: 74 75 76 86  Temp: 98 F (36.7 C) 98.2 F (36.8 C) 98.5 F (36.9 C) 98 F (36.7 C)  TempSrc: Oral Oral Oral Oral  Resp: 17 18 18 20   Height:      Weight:      SpO2: 99% 98%  100%    Intake/Output Summary (Last 24 hours) at 02/25/15 1036 Last data filed at 02/25/15 0900  Gross per 24 hour  Intake    410 ml  Output    400 ml  Net     10 ml    Exam:  General: No acute respiratory distress Lungs: Clear to auscultation bilaterally without wheezes or crackles Cardiovascular: Regular rate and rhythm without  murmur gallop or rub normal S1 and S2 Abdomen: Nontender, nondistended, soft, bowel sounds positive, no rebound, no ascites, no appreciable mass Extremities: No significant cyanosis, clubbing, or edema bilateral lower extremities     Data Review   Micro Results No results found for this or any previous visit (from the past 240 hour(s)).  Radiology Reports No results found.   CBC  Recent Labs Lab 02/22/15 1736 02/23/15 0541 02/24/15 0500 02/25/15 0730  WBC 5.1 4.3 5.2 5.3  HGB 15.9 13.2 13.9 14.0  HCT 43.6 37.0* 38.7* 39.5  PLT 66* 53* 55* 52*  MCV 103.6* 105.7* 104.6* 104.8*  MCH 37.8* 37.7* 37.6* 37.1*  MCHC 36.5* 35.7 35.9 35.4  RDW 14.1 14.4 14.2 14.1    Chemistries   Recent Labs Lab 02/22/15 1736  02/23/15 0541 02/24/15 0500 02/25/15 0730  NA 135 138 136 138  K 5.0 4.2 3.9 3.9  CL 102 108 106 109  CO2 25 23 21* 23  GLUCOSE 103* 169* 116* 125*  BUN 19 22* 20 15  CREATININE 0.97 1.12 0.85 0.77  CALCIUM 8.7* 8.0* 8.2* 8.3*  AST 59*  --  51* 50*  ALT 40  --  35 34  ALKPHOS 80  --  68 63  BILITOT 3.1*  --  1.9* 1.8*   ------------------------------------------------------------------------------------------------------------------ estimated creatinine clearance is 87 mL/min (by C-G formula based on Cr of 0.77). ------------------------------------------------------------------------------------------------------------------  Recent Labs  02/23/15 0541  HGBA1C 6.1*   ------------------------------------------------------------------------------------------------------------------ No results for input(s): CHOL, HDL, LDLCALC, TRIG, CHOLHDL, LDLDIRECT in the last 72 hours. ------------------------------------------------------------------------------------------------------------------  Recent Labs  02/25/15 0730  TSH 4.011   ------------------------------------------------------------------------------------------------------------------ No results for input(s): VITAMINB12, FOLATE, FERRITIN, TIBC, IRON, RETICCTPCT in the last 72 hours.  Coagulation profile No results for input(s): INR, PROTIME in the last 168 hours.  No results for input(s): DDIMER in the last 72 hours.  Cardiac Enzymes No results for input(s): CKMB, TROPONINI, MYOGLOBIN in the last 168 hours.  Invalid input(s): CK ------------------------------------------------------------------------------------------------------------------ Invalid input(s): POCBNP   CBG:  Recent Labs Lab 02/24/15 0805 02/24/15 1227 02/24/15 1702 02/24/15 2121 02/25/15 0736  GLUCAP 135* 185* 222* 189* 132*       Studies: No results found.    Lab Results  Component Value Date   HGBA1C 6.1* 02/23/2015   HGBA1C  6.5* 04/02/2014   HGBA1C 6.7* 11/11/2013   Lab Results  Component Value Date   CREATININE 0.77 02/25/2015       Scheduled Meds: . cefTRIAXone (ROCEPHIN)  IV  2 g Intravenous Q24H  . furosemide  40 mg Oral Daily  . insulin aspart  0-5 Units Subcutaneous QHS  . insulin aspart  0-9 Units Subcutaneous TID WC  . insulin glargine  8 Units Subcutaneous Daily  . nadolol  20 mg Oral Daily  . SORAfenib  400 mg Oral Q12H  . spironolactone  25 mg Oral Daily  . tamsulosin  0.4 mg Oral Daily   Continuous Infusions:    Principal Problem:   Hepatic encephalopathy (HCC) Active Problems:   Thrombocytopenia (HCC)   Chronic hepatitis C with cirrhosis (HCC)   Liver cancer ([place])   Diabetes type 2, controlled ([place])   Pressure ulcer    Time spent: 45 minutes   [facility] Pager [phone]. If 7PM-7AM, please contact night-coverage at [email or web address], password [facility] 02/25/2015, 10:36 AM  LOS: 3 days

## 2015-02-25 NOTE — Care Management Note (Signed)
Case Management Note  Patient Details  Name: Ethan Wade MRN: [id number] Date of Birth: Jun 23, 1950  Subjective/Objective:    Patient is for dc today, he chose [facility] for [facility], [place], [place], referral made to [facility] , [place] notified.  Soc will begin 24-48 hrs post dc. Patient is thinking about going to stay with his brothers after dc.                  Action/Plan:   Expected Discharge Date:                  Expected Discharge Plan:  [place]  In-House Referral:     Discharge planning Services  CM Consult  Post Acute Care Choice:  Home Health Choice offered to:  Patient  DME Arranged:    DME Agency:     HH Arranged:  RN, PT, OT HH Agency:     Status of Service:  Completed, signed off  Medicare Important Message Given:    Date Medicare IM Given:    Medicare IM give by:    Date Additional Medicare IM Given:    Additional Medicare Important Message give by:     If discussed at [place] of Stay Meetings, dates discussed:    Additional Comments:  Zenon Mayo, RN 02/25/2015, 2:35 PM

## 2015-02-25 NOTE — Progress Notes (Signed)
Patient presented today for US guided paracentesis, upon reviewing images no ascites is seen, procedure was cancelled.   Tsosie Billing PA-C Interventional Radiology  02/25/15  10:43 AM

## 2015-04-12 ENCOUNTER — Other Ambulatory Visit (HOSPITAL_COMMUNITY): Payer: Self-pay | Admitting: Gastroenterology

## 2015-04-12 DIAGNOSIS — R140 Abdominal distension (gaseous): Secondary | ICD-10-CM

## 2015-04-16 ENCOUNTER — Ambulatory Visit (HOSPITAL_COMMUNITY)
Admission: RE | Admit: 2015-04-16 | Discharge: 2015-04-16 | Disposition: A | Discharge status: Home / Self Care | Payer: Medicare Other | Source: Ambulatory Visit | Attending: Gastroenterology | Admitting: Gastroenterology

## 2015-04-16 ENCOUNTER — Other Ambulatory Visit (HOSPITAL_COMMUNITY): Payer: Self-pay | Admitting: Gastroenterology

## 2015-04-16 DIAGNOSIS — R140 Abdominal distension (gaseous): Secondary | ICD-10-CM | POA: Diagnosis not present

## 2015-04-16 MED ORDER — LIDOCAINE HCL (PF) 1 % IJ SOLN
INTRAMUSCULAR | Status: AC
  Filled 2015-04-16: qty 10

## 2015-04-16 NOTE — Progress Notes (Signed)
Patient ID: Ethan Wade, male   DOB: [birth year], 65 y.o.   MRN: [id number]   Limited Abd US performed All 4 quadrants  No ascites noted No procedure performed

## 2015-05-01 DIAGNOSIS — K7682 Hepatic encephalopathy: Secondary | ICD-10-CM

## 2015-05-01 DIAGNOSIS — K7290 Hepatic failure, unspecified without coma: Secondary | ICD-10-CM

## 2015-05-01 HISTORY — DX: Hepatic failure, unspecified without coma: K72.90

## 2015-05-01 HISTORY — DX: Hepatic encephalopathy: K76.82

## 2015-05-08 ENCOUNTER — Encounter (HOSPITAL_COMMUNITY): Payer: Self-pay | Admitting: *Deleted

## 2015-05-08 ENCOUNTER — Observation Stay (HOSPITAL_COMMUNITY)
Admission: EM | Admit: 2015-05-08 | Discharge: 2015-05-10 | Disposition: A | Discharge status: Home / Self Care | Payer: Medicare Other | Attending: Internal Medicine | Admitting: Internal Medicine

## 2015-05-08 ENCOUNTER — Emergency Department (HOSPITAL_COMMUNITY): Payer: Medicare Other

## 2015-05-08 DIAGNOSIS — N179 Acute kidney failure, unspecified: Secondary | ICD-10-CM | POA: Insufficient documentation

## 2015-05-08 DIAGNOSIS — D696 Thrombocytopenia, unspecified: Secondary | ICD-10-CM | POA: Insufficient documentation

## 2015-05-08 DIAGNOSIS — M25662 Stiffness of left knee, not elsewhere classified: Secondary | ICD-10-CM | POA: Diagnosis not present

## 2015-05-08 DIAGNOSIS — C229 Malignant neoplasm of liver, not specified as primary or secondary: Secondary | ICD-10-CM | POA: Insufficient documentation

## 2015-05-08 DIAGNOSIS — K7290 Hepatic failure, unspecified without coma: Secondary | ICD-10-CM

## 2015-05-08 DIAGNOSIS — M25661 Stiffness of right knee, not elsewhere classified: Secondary | ICD-10-CM | POA: Insufficient documentation

## 2015-05-08 DIAGNOSIS — Z794 Long term (current) use of insulin: Secondary | ICD-10-CM | POA: Diagnosis not present

## 2015-05-08 DIAGNOSIS — K7460 Unspecified cirrhosis of liver: Secondary | ICD-10-CM | POA: Diagnosis not present

## 2015-05-08 DIAGNOSIS — E875 Hyperkalemia: Secondary | ICD-10-CM | POA: Insufficient documentation

## 2015-05-08 DIAGNOSIS — I10 Essential (primary) hypertension: Secondary | ICD-10-CM | POA: Insufficient documentation

## 2015-05-08 DIAGNOSIS — K7200 Acute and subacute hepatic failure without coma: Principal | ICD-10-CM | POA: Insufficient documentation

## 2015-05-08 DIAGNOSIS — E119 Type 2 diabetes mellitus without complications: Secondary | ICD-10-CM | POA: Diagnosis not present

## 2015-05-08 DIAGNOSIS — I341 Nonrheumatic mitral (valve) prolapse: Secondary | ICD-10-CM | POA: Insufficient documentation

## 2015-05-08 DIAGNOSIS — R7989 Other specified abnormal findings of blood chemistry: Secondary | ICD-10-CM

## 2015-05-08 DIAGNOSIS — C220 Liver cell carcinoma: Secondary | ICD-10-CM

## 2015-05-08 DIAGNOSIS — E118 Type 2 diabetes mellitus with unspecified complications: Secondary | ICD-10-CM

## 2015-05-08 DIAGNOSIS — K7682 Hepatic encephalopathy: Secondary | ICD-10-CM

## 2015-05-08 DIAGNOSIS — B182 Chronic viral hepatitis C: Secondary | ICD-10-CM | POA: Insufficient documentation

## 2015-05-08 DIAGNOSIS — N400 Enlarged prostate without lower urinary tract symptoms: Secondary | ICD-10-CM | POA: Insufficient documentation

## 2015-05-08 DIAGNOSIS — Z87891 Personal history of nicotine dependence: Secondary | ICD-10-CM | POA: Diagnosis not present

## 2015-05-08 DIAGNOSIS — E559 Vitamin D deficiency, unspecified: Secondary | ICD-10-CM | POA: Diagnosis not present

## 2015-05-08 DIAGNOSIS — G9340 Encephalopathy, unspecified: Secondary | ICD-10-CM | POA: Insufficient documentation

## 2015-05-08 HISTORY — DX: Hepatic failure, unspecified without coma: K72.90

## 2015-05-08 LAB — CBC
HCT: 44 % (ref 39.0–52.0)
HCT: 42.5 % (ref 39.0–52.0)
HEMOGLOBIN: 15.5 g/dL (ref 13.0–17.0)
Hemoglobin: 16 g/dL (ref 13.0–17.0)
MCH: 36.7 pg — ABNORMAL HIGH (ref 26.0–34.0)
MCH: 37.1 pg — ABNORMAL HIGH (ref 26.0–34.0)
MCHC: 36.4 g/dL — AB (ref 30.0–36.0)
MCHC: 36.5 g/dL — AB (ref 30.0–36.0)
MCV: 100.9 fL — AB (ref 78.0–100.0)
MCV: 101.7 fL — ABNORMAL HIGH (ref 78.0–100.0)
PLATELETS: 78 10*3/uL — AB (ref 150–400)
Platelets: 73 10*3/uL — ABNORMAL LOW (ref 150–400)
RBC: 4.36 MIL/uL (ref 4.22–5.81)
RBC: 4.18 MIL/uL — AB (ref 4.22–5.81)
RDW: 14.1 % (ref 11.5–15.5)
RDW: 14.4 % (ref 11.5–15.5)
WBC: 7.9 10*3/uL (ref 4.0–10.5)
WBC: 6.7 10*3/uL (ref 4.0–10.5)

## 2015-05-08 LAB — COMPREHENSIVE METABOLIC PANEL
ALBUMIN: 2.9 g/dL — AB (ref 3.5–5.0)
ALBUMIN: 2.7 g/dL — AB (ref 3.5–5.0)
ALK PHOS: 89 U/L (ref 38–126)
ALK PHOS: 82 U/L (ref 38–126)
ALT: 51 U/L (ref 17–63)
ALT: 45 U/L (ref 17–63)
ANION GAP: 11 (ref 5–15)
ANION GAP: 13 (ref 5–15)
AST: 70 U/L — ABNORMAL HIGH (ref 15–41)
AST: 63 U/L — ABNORMAL HIGH (ref 15–41)
BUN: 21 mg/dL — AB (ref 6–20)
BUN: 23 mg/dL — ABNORMAL HIGH (ref 6–20)
CALCIUM: 9.2 mg/dL (ref 8.9–10.3)
CO2: 23 mmol/L (ref 22–32)
CO2: 25 mmol/L (ref 22–32)
CREATININE: 1.3 mg/dL — AB (ref 0.61–1.24)
Calcium: 9.3 mg/dL (ref 8.9–10.3)
Chloride: 97 mmol/L — ABNORMAL LOW (ref 101–111)
Chloride: 97 mmol/L — ABNORMAL LOW (ref 101–111)
Creatinine, Ser: 1.33 mg/dL — ABNORMAL HIGH (ref 0.61–1.24)
GFR calc Af Amer: >60 mL/min (ref >60)
GFR calc non Af Amer: 56 mL/min — ABNORMAL LOW (ref >60)
GFR calc non Af Amer: 54 mL/min — ABNORMAL LOW (ref >60)
GLUCOSE: 103 mg/dL — AB (ref 65–99)
Glucose, Bld: 145 mg/dL — ABNORMAL HIGH (ref 65–99)
POTASSIUM: 6 mmol/L — AB (ref 3.5–5.1)
Potassium: 5.4 mmol/L — ABNORMAL HIGH (ref 3.5–5.1)
SODIUM: 131 mmol/L — AB (ref 135–145)
SODIUM: 135 mmol/L (ref 135–145)
Total Bilirubin: 3.6 mg/dL — ABNORMAL HIGH (ref 0.3–1.2)
Total Bilirubin: 3.6 mg/dL — ABNORMAL HIGH (ref 0.3–1.2)
Total Protein: 7.1 g/dL (ref 6.5–8.1)
Total Protein: 6.6 g/dL (ref 6.5–8.1)

## 2015-05-08 LAB — AMMONIA: Ammonia: 152 umol/L — ABNORMAL HIGH (ref 9–35)

## 2015-05-08 LAB — GLUCOSE, CAPILLARY: Glucose-Capillary: 107 mg/dL — ABNORMAL HIGH (ref 65–99)

## 2015-05-08 MED ORDER — INSULIN GLARGINE 100 UNIT/ML ~~LOC~~ SOLN
8.0000 [IU] | Freq: Every day | SUBCUTANEOUS | Status: DC
  Administered 2015-05-08 – 2015-05-09 (×2): 8 [IU] via SUBCUTANEOUS
  Filled 2015-05-08 (×4): qty 0.08

## 2015-05-08 MED ORDER — PROCHLORPERAZINE MALEATE 10 MG PO TABS: 10.0000 mg | ORAL_TABLET | Freq: Four times a day (QID) | ORAL | Status: DC | PRN

## 2015-05-08 MED ORDER — LACTULOSE 10 GM/15ML PO SOLN
30.0000 g | Freq: Three times a day (TID) | ORAL | Status: DC
  Administered 2015-05-08 – 2015-05-09 (×4): 30 g via ORAL
  Filled 2015-05-08 (×5): qty 45

## 2015-05-08 MED ORDER — MORPHINE SULFATE (PF) 2 MG/ML IV SOLN: 2.0000 mg | INTRAVENOUS | Status: DC | PRN

## 2015-05-08 MED ORDER — ONDANSETRON HCL 4 MG/2ML IJ SOLN: 4.0000 mg | Freq: Three times a day (TID) | INTRAMUSCULAR | Status: AC | PRN

## 2015-05-08 MED ORDER — SODIUM CHLORIDE 0.9 % IV SOLN: 250.0000 mL | INTRAVENOUS | Status: DC | PRN

## 2015-05-08 MED ORDER — SODIUM POLYSTYRENE SULFONATE 15 GM/60ML PO SUSP
15.0000 g | Freq: Once | ORAL | Status: AC
  Administered 2015-05-08: 15 g via ORAL
  Filled 2015-05-08: qty 60

## 2015-05-08 MED ORDER — TAMSULOSIN HCL 0.4 MG PO CAPS
0.4000 mg | ORAL_CAPSULE | Freq: Every day | ORAL | Status: DC
  Administered 2015-05-08 – 2015-05-09 (×2): 0.4 mg via ORAL
  Filled 2015-05-08 (×2): qty 1

## 2015-05-08 MED ORDER — DICYCLOMINE HCL 10 MG PO CAPS
10.0000 mg | ORAL_CAPSULE | Freq: Two times a day (BID) | ORAL | Status: DC
  Administered 2015-05-08 – 2015-05-09 (×3): 10 mg via ORAL
  Filled 2015-05-08 (×5): qty 1

## 2015-05-08 MED ORDER — SODIUM CHLORIDE 0.9% FLUSH
3.0000 mL | Freq: Two times a day (BID) | INTRAVENOUS | Status: DC
  Administered 2015-05-08: 3 mL via INTRAVENOUS

## 2015-05-08 MED ORDER — SODIUM CHLORIDE 0.9 % IV SOLN
INTRAVENOUS | Status: DC
  Administered 2015-05-08 – 2015-05-09 (×3): via INTRAVENOUS

## 2015-05-08 MED ORDER — NADOLOL 20 MG PO TABS
20.0000 mg | ORAL_TABLET | Freq: Every day | ORAL | Status: DC
  Administered 2015-05-08 – 2015-05-09 (×2): 20 mg via ORAL
  Filled 2015-05-08 (×3): qty 1

## 2015-05-08 MED ORDER — SODIUM CHLORIDE 0.9% FLUSH: 3.0000 mL | Freq: Two times a day (BID) | INTRAVENOUS | Status: DC

## 2015-05-08 MED ORDER — SORAFENIB TOSYLATE 200 MG PO TABS
400.0000 mg | ORAL_TABLET | Freq: Two times a day (BID) | ORAL | Status: DC
  Filled 2015-05-08 (×2): qty 2

## 2015-05-08 MED ORDER — INSULIN ASPART 100 UNIT/ML ~~LOC~~ SOLN
0.0000 [IU] | Freq: Three times a day (TID) | SUBCUTANEOUS | Status: DC
  Administered 2015-05-09: 2 [IU] via SUBCUTANEOUS
  Administered 2015-05-09: 3 [IU] via SUBCUTANEOUS
  Administered 2015-05-10: 1 [IU] via SUBCUTANEOUS

## 2015-05-08 MED ORDER — SODIUM CHLORIDE 0.9% FLUSH: 3.0000 mL | INTRAVENOUS | Status: DC | PRN

## 2015-05-08 NOTE — H&P (Signed)
[facility] History and Physical  Donley Biermann [id number] DOB: Jan 23, 1951 DOA: 05/08/2015  Referring physician: Isaias Sakai, EDP PCP: Gerrit Heck, MD  Specialists:   Chief Complaint: Confusion  HPI: Ethan Wade is a 65 y.o. male  With a history of diabetes, hepatitis C, liver cirrhosis, liver cancer that presented to the emergency department with confusion. Patient's brother is at bedside and provided history.  Patient was last seen normal yesterday evening when he and his further when out for dinner. Upon speaking to him this morning, patient was nonsensical on the telephone. Per brother, patient becomes confused with his ammonia level starts to rise. He states that normally the patient does take his lactulose consistently. Patient has not had fever or any type of illness recently. In the emergency department, patient was found to have hyperkalemia, potassium of 6, ammonia 152. [facility] called for admission.  Review of Systems:  Unable to obtain secondary patient's current state  Past Medical History  Diagnosis Date  . Macrocytosis   . Thrombocytopenia ([place])   . Esophageal bleed, non-variceal 2006  . HTN (hypertension)   . Psoriasis   . Mitral valve prolapse     OCCAS FLUTTER FEELING  . Vitamin D deficiency   . Umbilical hernia     OCCAS DISCOMFORT  . Prostate enlargement     PT TOLD "NORMAL FOR AGE" - TAKES FINASTERIDE AND FLOMAX  . Heart murmur   . Type II diabetes mellitus ([place])   . Rheumatic fever 1950's  . Hepatitis C 1980's  . Arthritis     "joint stiffness in the knees" (11/10/2013)  . Liver cancer ([place])   . Ascites    Past Surgical History  Procedure Laterality Date  . Tonsilectomy, adenoidectomy, bilateral myringotomy and tubes  child  . Umbilical hernia repair  03/09/2012    Procedure: HERNIA REPAIR UMBILICAL ADULT;  Surgeon: Edward Jolly, MD;  Location: [facility];  Service: General;  Laterality: N/A;  Repair Umbilical Hernia with  Mesh  . Insertion of mesh  03/09/2012    Procedure: INSERTION OF MESH;  Surgeon: Edward Jolly, MD;  Location: [facility];  Service: General;  Laterality: N/A;  . Inguinal hernia repair Left 1970's  . Hernia repair    . Tonsillectomy     Social History:  reports that he has quit smoking. His smoking use included Cigars. He started smoking about 9 years ago. He has quit using smokeless tobacco. His smokeless tobacco use included Snuff. He reports that he drinks alcohol. He reports that he uses illicit drugs. Patient lives alone.   No Known Allergies  Family History  Problem Relation Age of Onset  . Prostate cancer Brother   . COPD Mother   . Heart disease Father   . Diabetes Father   . Alcohol abuse Father   . Liver cancer Brother   . Colon cancer Brother   . COPD Maternal Grandfather     Prior to Admission medications   Medication Sig Start Date End Date Taking? Authorizing Provider  Carboxymethylcellul-Glycerin (CLEAR EYES FOR DRY EYES) 1-0.25 % SOLN Apply 1-2 drops to eye daily as needed. For dry eyes   Yes Historical Provider, MD  dicyclomine (BENTYL) 10 MG capsule Take 1 capsule (10 mg total) by mouth 2 (two) times daily. 11/13/13  Yes Ripudeep Krystal Eaton, MD  furosemide (LASIX) 40 MG tablet Take 40 mg by mouth daily.   Yes Historical Provider, MD  insulin glargine (LANTUS) 100 UNIT/ML injection Inject 0.08 mLs (8 Units  total) into the skin daily. 04/04/14  Yes Shanker Kristeen Mans, MD  lactulose (CHRONULAC) 10 GM/15ML solution Take 45 mLs (30 g total) by mouth 3 (three) times daily. 02/25/15  Yes Reyne Dumas, MD  nadolol (CORGARD) 20 MG tablet Take 20 mg by mouth daily.   Yes Historical Provider, MD  oxyCODONE (OXY IR/ROXICODONE) 5 MG immediate release tablet Take 1 tablet (5 mg total) by mouth every 12 (twelve) hours as needed for severe pain or breakthrough pain (only for severe or breakthrough pain). Patient taking differently: Take 5 mg by mouth every 4 (four) hours as needed  (pain).  11/13/13  Yes Ripudeep Krystal Eaton, MD  prochlorperazine (COMPAZINE) 10 MG tablet Take 10 mg by mouth every 6 (six) hours as needed for nausea or vomiting.   Yes Historical Provider, MD  SORAfenib (NEXAVAR) 200 MG tablet Take 400 mg by mouth every 12 (twelve) hours. Take on an empty stomach 1 hour before or 2 hours after meals.   Yes Historical Provider, MD  spironolactone (ALDACTONE) 25 MG tablet Take 1 tablet (25 mg total) by mouth daily. 04/04/14  Yes Shanker Kristeen Mans, MD  Tamsulosin HCl (FLOMAX) 0.4 MG CAPS Take 0.4 mg by mouth daily.   Yes Historical Provider, MD  Vitamin D, Ergocalciferol, (DRISDOL) 50000 UNITS CAPS Take 50,000 Units by mouth every Friday.   Yes Historical Provider, MD  ondansetron (ZOFRAN ODT) 4 MG disintegrating tablet Take 1 tablet (4 mg total) by mouth every 8 (eight) hours as needed for nausea or vomiting. Patient not taking: Reported on 08/16/2014 11/13/13   Ripudeep Krystal Eaton, MD   Physical Exam: Filed Vitals:   05/08/15 1403 05/08/15 1725  BP: 101/57 110/79  Pulse: 87 77  Temp: 98.3 F (36.8 C)   Resp: 18 14     General: Well developed, well nourished, NAD, appears stated age  HEENT: NCAT, PERRLA, EOMI, Anicteic Sclera, mucous membranes Dry.   Neck: Supple, no JVD, no masses  Cardiovascular: S1 S2 auscultated 2/6 SEM. Regular rate and rhythm.  Respiratory: Clear to auscultation bilaterally with equal chest rise  Abdomen: Soft, nontender, nondistended, + bowel sounds  Extremities: warm dry without cyanosis clubbing or edema  Neuro: Pleasantly confused.  Follows commands, however cannot answer questions appropriately.  Cranial nerves intact as tested  Skin: Without rashes exudates or nodules, dry  Psych: Unable to assess secondary to patient's state  Labs on Admission:  Basic Metabolic Panel:  Recent Labs Lab 05/08/15 1433  NA 131*  K 6.0*  CL 97*  CO2 23  GLUCOSE 145*  BUN 21*  CREATININE 1.30*  CALCIUM 9.3   Liver Function  Tests:  Recent Labs Lab 05/08/15 1433  AST 70*  ALT 51  ALKPHOS 89  BILITOT 3.6*  PROT 7.1  ALBUMIN 2.9*   No results for input(s): LIPASE, AMYLASE in the last 168 hours.  Recent Labs Lab 05/08/15 1433  AMMONIA 152*   CBC:  Recent Labs Lab 05/08/15 1433  WBC 7.9  HGB 16.0  HCT 44.0  MCV 100.9*  PLT 78*   Cardiac Enzymes: No results for input(s): CKTOTAL, CKMB, CKMBINDEX, TROPONINI in the last 168 hours.  BNP (last 3 results) No results for input(s): BNP in the last 8760 hours.  ProBNP (last 3 results) No results for input(s): PROBNP in the last 8760 hours.  CBG: No results for input(s): GLUCAP in the last 168 hours.  Radiological Exams on Admission: Dg Chest Portable 1 View  05/08/2015  CLINICAL DATA:  Altered  mental status, no chest pain or shortness of breath EXAM: PORTABLE CHEST 1 VIEW COMPARISON:  04/01/2014 FINDINGS: The heart size and mediastinal contours are within normal limits. Both lungs are clear. The visualized skeletal structures are unremarkable. IMPRESSION: No active disease. Electronically Signed   By: Kathreen Devoid   On: 05/08/2015 17:56    EKG: Independently reviewed. Sinus rhythm, rate 89, peaked T waves  Assessment/Plan  Acute hepatic encephalopathy -Patient will be admitted for observation to telemetry unit -Ammonia level 152 -Will continue lactulose -Will continue to monitor ammonia level  Hyperkalemia -Ordered hyperkalemia orders that, will continue to monitor potassium -Will give calcium gluconate -Possibly secondary to her spironolactone -Will order gentle IV hydration  Acute kidney injury -Creatinine 1.30 upon admission, baseline 0.6 -Will continue to monitor BMP closely, creatinine may worsen this patient will be given lactulose -Hold spironolactone and lasix  Essential hypertension -Continue atenolol with holding parameters, hold Lasix and spironolactone due to AKI  Chronic hepatitis C with cirrhosis/liver  cancer -Patient [facility] with physicians at [facility] and is supposed to start [place] in the near future -Continue Nexavar  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 Admission, patients condition and plan of care including tests being ordered have been discussed with the patient and brother, who indicate understanding and agree with the plan and Code Status.  Disposition Plan: Admitted for observation  Time spent: 60 minutes  Mattison Stuckey D.O. [facility] Pager [phone]  If 7PM-7AM, please contact night-coverage [email or web address] Password [email or web address] 05/08/2015, 6:10 PM

## 2015-05-08 NOTE — ED Notes (Signed)
Per pt and family, was last seen normal last night. Onset today of being altered and "feeling loopy." pt has hx of liver cancer and high ammonia levels.

## 2015-05-08 NOTE — ED Provider Notes (Signed)
CSN: [id number]     Arrival date & time 05/08/15  1332 History   First MD Initiated Contact with Patient 05/08/15 1711     Chief Complaint  Patient presents with  . Altered Mental Status      HPI Per pt and family, was last seen normal last night. Onset today of being altered and "feeling loopy." pt has hx of liver cancer and high ammonia levels.  Past Medical History  Diagnosis Date  . Macrocytosis   . Thrombocytopenia ([place])   . Esophageal bleed, non-variceal 2006  . HTN (hypertension)   . Psoriasis   . Mitral valve prolapse     OCCAS FLUTTER FEELING  . Vitamin D deficiency   . Umbilical hernia     OCCAS DISCOMFORT  . Prostate enlargement     PT TOLD "NORMAL FOR AGE" - TAKES FINASTERIDE AND FLOMAX  . Heart murmur   . Type II diabetes mellitus ([place])   . Rheumatic fever 1950's  . Hepatitis C 1980's  . Arthritis     "joint stiffness in the knees" (11/10/2013)  . Liver cancer ([place])   . Ascites    Past Surgical History  Procedure Laterality Date  . Tonsilectomy, adenoidectomy, bilateral myringotomy and tubes  child  . Umbilical hernia repair  03/09/2012    Procedure: HERNIA REPAIR UMBILICAL ADULT;  Surgeon: Edward Jolly, MD;  Location: [facility];  Service: General;  Laterality: N/A;  Repair Umbilical Hernia with Mesh  . Insertion of mesh  03/09/2012    Procedure: INSERTION OF MESH;  Surgeon: Edward Jolly, MD;  Location: [facility];  Service: General;  Laterality: N/A;  . Inguinal hernia repair Left 1970's  . Hernia repair    . Tonsillectomy     Family History  Problem Relation Age of Onset  . Prostate cancer Brother   . COPD Mother   . Heart disease Father   . Diabetes Father   . Alcohol abuse Father   . Liver cancer Brother   . Colon cancer Brother   . COPD Maternal Grandfather    Social History  Substance Use Topics  . Smoking status: Former Smoker -- 20 years    Types: Cigars    Start date: 12/22/2005  . Smokeless tobacco: Former Systems developer    Types: Snuff      Comment: "quit smoking ~ 2009; quit snuff in ~ 2013"  . Alcohol Use: Yes     Comment: "quit in late April 2015"    Review of Systems  Unable to perform ROS: Mental status change      Allergies  Review of patient's allergies indicates no known allergies.  Home Medications   Prior to Admission medications   Medication Sig Start Date End Date Taking? Authorizing Provider  Carboxymethylcellul-Glycerin (CLEAR EYES FOR DRY EYES) 1-0.25 % SOLN Apply 1-2 drops to eye daily as needed. For dry eyes   Yes Historical Provider, MD  furosemide (LASIX) 40 MG tablet Take 40 mg by mouth daily.   Yes Historical Provider, MD  insulin glargine (LANTUS) 100 UNIT/ML injection Inject 0.08 mLs (8 Units total) into the skin daily. 04/04/14  Yes Shanker Kristeen Mans, MD  lactulose (CHRONULAC) 10 GM/15ML solution Take 45 mLs (30 g total) by mouth 3 (three) times daily. 02/25/15  Yes Reyne Dumas, MD  nadolol (CORGARD) 20 MG tablet Take 20 mg by mouth daily.   Yes Historical Provider, MD  oxyCODONE (OXY IR/ROXICODONE) 5 MG immediate release tablet Take 1 tablet (5 mg total)  by mouth every 12 (twelve) hours as needed for severe pain or breakthrough pain (only for severe or breakthrough pain). Patient taking differently: Take 5 mg by mouth every 4 (four) hours as needed (pain).  11/13/13  Yes Ripudeep Krystal Eaton, MD  prochlorperazine (COMPAZINE) 10 MG tablet Take 10 mg by mouth every 6 (six) hours as needed for nausea or vomiting.   Yes Historical Provider, MD  SORAfenib (NEXAVAR) 200 MG tablet Take 400 mg by mouth every 12 (twelve) hours. Take on an empty stomach 1 hour before or 2 hours after meals.   Yes Historical Provider, MD  spironolactone (ALDACTONE) 25 MG tablet Take 1 tablet (25 mg total) by mouth daily. 04/04/14  Yes Shanker Kristeen Mans, MD  Tamsulosin HCl (FLOMAX) 0.4 MG CAPS Take 0.4 mg by mouth daily.   Yes Historical Provider, MD  Vitamin D, Ergocalciferol, (DRISDOL) 50000 UNITS CAPS Take 50,000 Units by mouth  every Friday.   Yes Historical Provider, MD  dicyclomine (BENTYL) 10 MG capsule Take 1 capsule (10 mg total) by mouth 2 (two) times daily. 11/13/13   Ripudeep Krystal Eaton, MD  ondansetron (ZOFRAN ODT) 4 MG disintegrating tablet Take 1 tablet (4 mg total) by mouth every 8 (eight) hours as needed for nausea or vomiting. Patient not taking: Reported on 08/16/2014 11/13/13   Ripudeep K Rai, MD   BP 110/79 mmHg  Pulse 77  Temp(Src) 98.3 F (36.8 C) (Oral)  Resp 14  SpO2 99% Physical Exam  Constitutional: He appears well-developed and well-nourished. No distress.  HENT:  Head: Normocephalic and atraumatic.  Eyes: Pupils are equal, round, and reactive to light.  Neck: Normal range of motion.  Cardiovascular: Normal rate and intact distal pulses.   Pulmonary/Chest: No respiratory distress.  Abdominal: Normal appearance. He exhibits no distension. There is no tenderness.  Musculoskeletal: Normal range of motion.  Neurological: He is alert. No cranial nerve deficit. GCS eye subscore is 4. GCS verbal subscore is 4. GCS motor subscore is 6.  Skin: Skin is warm and dry. No rash noted.  Psychiatric: He has a normal mood and affect. His behavior is normal.  Nursing note and vitals reviewed.   ED Course  Procedures (including critical care time) Labs Review Labs Reviewed  COMPREHENSIVE METABOLIC PANEL - Abnormal; Notable for the following:    Sodium 131 (*)    Potassium 6.0 (*)    Chloride 97 (*)    Glucose, Bld 145 (*)    BUN 21 (*)    Creatinine, Ser 1.30 (*)    Albumin 2.9 (*)    AST 70 (*)    Total Bilirubin 3.6 (*)    GFR calc non Af Amer 56 (*)    All other components within normal limits  CBC - Abnormal; Notable for the following:    MCV 100.9 (*)    MCH 36.7 (*)    MCHC 36.4 (*)    Platelets 78 (*)    All other components within normal limits  AMMONIA - Abnormal; Notable for the following:    Ammonia 152 (*)    All other components within normal limits    Imaging Review No  results found. I have personally reviewed and evaluated these images and lab results as part of my medical decision-making.   EKG Interpretation   Date/Time:  Wednesday May 08 2015 14:14:38 EST Ventricular Rate:  89 PR Interval:  164 QRS Duration: 84 QT Interval:  386 QTC Calculation: 469 R Axis:   18 Text Interpretation:  Normal sinus rhythm Normal ECG Confirmed by Hubert Derstine   MD, Yesmin Mutch ([id number]) on 05/08/2015 5:17:22 PM      MDM   Final diagnoses:  Hepatic encephalopathy (HCC)        Leonard Schwartz, MD 05/08/15 1754

## 2015-05-09 ENCOUNTER — Encounter (HOSPITAL_COMMUNITY): Payer: Self-pay | Admitting: General Practice

## 2015-05-09 DIAGNOSIS — K7290 Hepatic failure, unspecified without coma: Secondary | ICD-10-CM | POA: Diagnosis not present

## 2015-05-09 DIAGNOSIS — E118 Type 2 diabetes mellitus with unspecified complications: Secondary | ICD-10-CM | POA: Diagnosis not present

## 2015-05-09 DIAGNOSIS — B182 Chronic viral hepatitis C: Secondary | ICD-10-CM | POA: Diagnosis not present

## 2015-05-09 DIAGNOSIS — I10 Essential (primary) hypertension: Secondary | ICD-10-CM | POA: Diagnosis not present

## 2015-05-09 DIAGNOSIS — K7200 Acute and subacute hepatic failure without coma: Secondary | ICD-10-CM | POA: Diagnosis not present

## 2015-05-09 LAB — BASIC METABOLIC PANEL
ANION GAP: 13 (ref 5–15)
BUN: 27 mg/dL — AB (ref 6–20)
CO2: 23 mmol/L (ref 22–32)
Calcium: 8.7 mg/dL — ABNORMAL LOW (ref 8.9–10.3)
Chloride: 98 mmol/L — ABNORMAL LOW (ref 101–111)
Creatinine, Ser: 1.34 mg/dL — ABNORMAL HIGH (ref 0.61–1.24)
GFR calc Af Amer: >60 mL/min (ref >60)
GFR, EST NON AFRICAN AMERICAN: 54 mL/min — AB (ref >60)
GLUCOSE: 120 mg/dL — AB (ref 65–99)
POTASSIUM: 4.6 mmol/L (ref 3.5–5.1)
Sodium: 134 mmol/L — ABNORMAL LOW (ref 135–145)

## 2015-05-09 LAB — GLUCOSE, CAPILLARY
GLUCOSE-CAPILLARY: 195 mg/dL — AB (ref 65–99)
Glucose-Capillary: 105 mg/dL — ABNORMAL HIGH (ref 65–99)
Glucose-Capillary: 231 mg/dL — ABNORMAL HIGH (ref 65–99)
Glucose-Capillary: 181 mg/dL — ABNORMAL HIGH (ref 65–99)

## 2015-05-09 LAB — AMMONIA: Ammonia: 75 umol/L — ABNORMAL HIGH (ref 9–35)

## 2015-05-09 LAB — POTASSIUM: POTASSIUM: 4.7 mmol/L (ref 3.5–5.1)

## 2015-05-09 MED ORDER — SORAFENIB TOSYLATE 200 MG PO TABS
200.0000 mg | ORAL_TABLET | Freq: Two times a day (BID) | ORAL | Status: DC
  Administered 2015-05-09: 200 mg via ORAL
  Filled 2015-05-09: qty 1

## 2015-05-09 MED ORDER — LACTULOSE 10 GM/15ML PO SOLN
30.0000 g | Freq: Once | ORAL | Status: AC
  Administered 2015-05-09: 30 g via ORAL
  Filled 2015-05-09: qty 45

## 2015-05-09 MED ORDER — SODIUM CHLORIDE 0.9 % IV SOLN: INTRAVENOUS | Status: DC

## 2015-05-09 MED ORDER — WHITE PETROLATUM GEL
Status: AC
  Filled 2015-05-09: qty 1

## 2015-05-09 NOTE — Progress Notes (Signed)
[facility]                                                                              Patient Demographics  Ethan Wade, is a 65 y.o. male, DOB - 07-11-1950, [id number]  Admit date - 05/08/2015   Admitting Physician Cristal Ford, DO  Outpatient Primary MD for the patient is Gerrit Heck, MD  LOS -    Chief Complaint  Patient presents with  . Altered Mental Status      HPI on 05/08/15  Ethan Wade is a 65 y.o. male with a history of diabetes, hepatitis C, liver cirrhosis, liver cancer that presented to the emergency department with confusion. Patient's brother is at bedside and provided history. Patient was last seen normal yesterday evening when he and his further when out for dinner. Upon speaking to him this morning, patient was nonsensical on the telephone. Per brother, patient becomes confused with his ammonia level starts to rise. He states that normally the patient does take his lactulose consistently. Patient has not had fever or any type of illness recently. In the emergency department, patient was found to have hyperkalemia, potassium of 6, ammonia 152. [facility] called for admission.  Assessment & Plan   Acute hepatic encephalopathy -Ammonia level 152 upon admission -Continue lactulose  -Ammonia level 75 today  Hyperkalemia -Ordered hyperkalemia set upon admission -Possibly secondary to spironolactone -Resolved, continue to monitor BMP  Acute kidney injury -Creatinine 1.34 upon admission, baseline 0.6 -Will continue to monitor BMP closely, creatinine may worsen this patient will be given lactulose -Hold spironolactone and lasix  Essential hypertension -Continue atenolol with holding parameters, hold Lasix and spironolactone due to AKI  Chronic hepatitis C with cirrhosis/liver cancer -Patient follows up with physicians at [facility] and is supposed to start Harvoni in the near future -Continue Nexavar  Thrombocytopenia -Secondary to  cirrhosis -Platelets currently 73, baseline appears to be 50s to 46s -Continue to monitor CBC  Diabetes mellitus, type II -Continue Lantus, and complaints CBG Monitoring  Code Status: Full  Family Communication: None at bedside  Disposition Plan: Admitted for observation. Expect discharge when patient's mental status has improved.   Time Spent in minutes   30 minutes  Procedures  None  Consults   None  DVT Prophylaxis  SCDs  Lab Results  Component Value Date   PLT 73* 05/08/2015    Medications  Scheduled Meds: . dicyclomine  10 mg Oral BID  . insulin aspart  0-9 Units Subcutaneous TID WC  . insulin glargine  8 Units Subcutaneous Daily  . lactulose  30 g Oral TID  . nadolol  20 mg Oral Daily  . sodium chloride flush  3 mL Intravenous Q12H  . sodium chloride flush  3 mL Intravenous Q12H  . SORAfenib  400 mg Oral Q12H  . tamsulosin  0.4 mg Oral Daily   Continuous Infusions: . sodium chloride 75 mL/hr at 05/09/15 1008   PRN Meds:.sodium chloride, morphine injection, prochlorperazine, sodium chloride flush  Antibiotics    Anti-infectives    None      Subjective:   [name] seen and examined today.  Patient still confused.  Able to answer questions related to self.  Has no complaints.     Objective:   Filed Vitals:   05/08/15 1800 05/08/15 1933 05/08/15 2110 05/09/15 0514  BP: 108/84 106/78 97/73 94/75   Pulse: 73 78 89 72  Temp:  97.8 F (36.6 C) 98.3 F (36.8 C) 98.4 F (36.9 C)  TempSrc:  Oral Oral Oral  Resp: 19 17 18 18   SpO2: 99% 96% 97% 97%    Wt Readings from Last 3 Encounters:  02/22/15 83.9 kg (184 lb 15.5 oz)  11/25/14 71.85 kg (158 lb 6.4 oz)  11/12/14 83.462 kg (184 lb)     Intake/Output Summary (Last 24 hours) at 05/09/15 1245 Last data filed at 05/09/15 0950  Gross per 24 hour  Intake    360 ml  Output      2 ml  Net    358 ml    Exam  General: Well developed, well nourished, NAD, appears stated age  HEENT: NCAT,  mucous membranes moist.   Cardiovascular: S1 S2 auscultated, 2/6SEM, RRR  Respiratory: Clear to auscultation bilaterally with equal chest rise  Abdomen: Soft, nontender, nondistended, + bowel sounds  Extremities: warm dry without cyanosis clubbing or edema  Neuro: AAO to self, not place or time.  Follows commands.   Psych: Pleasantly confused  Data Review   Micro Results No results found for this or any previous visit (from the past 240 hour(s)).  Radiology Reports US Abdomen Limited  04/16/2015  CLINICAL DATA:  Evaluate for ascites. EXAM: LIMITED ABDOMEN ULTRASOUND FOR ASCITES TECHNIQUE: Limited ultrasound survey for ascites was performed in all four abdominal quadrants. COMPARISON:  Ultrasound abdomen 02/25/2015 FINDINGS: No significant free fluid identified within all 4 quadrants. IMPRESSION: No significant fluid within the abdomen at this time. Electronically Signed   By: Lovey Newcomer M.D.   On: 04/16/2015 10:25   Dg Chest Portable 1 View  05/08/2015  CLINICAL DATA:  Altered mental status, no chest pain or shortness of breath EXAM: PORTABLE CHEST 1 VIEW COMPARISON:  04/01/2014 FINDINGS: The heart size and mediastinal contours are within normal limits. Both lungs are clear. The visualized skeletal structures are unremarkable. IMPRESSION: No active disease. Electronically Signed   By: Kathreen Devoid   On: 05/08/2015 17:56    CBC  Recent Labs Lab 05/08/15 1433 05/08/15 2120  WBC 7.9 6.7  HGB 16.0 15.5  HCT 44.0 42.5  PLT 78* 73*  MCV 100.9* 101.7*  MCH 36.7* 37.1*  MCHC 36.4* 36.5*  RDW 14.1 14.4    Chemistries   Recent Labs Lab 05/08/15 1433 05/08/15 2120 05/09/15 0726  NA 131* 135 134*  K 6.0* 5.4* 4.6  CL 97* 97* 98*  CO2 23 25 23   GLUCOSE 145* 103* 120*  BUN 21* 23* 27*  CREATININE 1.30* 1.33* 1.34*  CALCIUM 9.3 9.2 8.7*  AST 70* 63*  --   ALT 51 45  --   ALKPHOS 89 82  --   BILITOT 3.6* 3.6*  --     ------------------------------------------------------------------------------------------------------------------ CrCl cannot be calculated (Unknown ideal weight.). ------------------------------------------------------------------------------------------------------------------ No results for input(s): HGBA1C in the last 72 hours. ------------------------------------------------------------------------------------------------------------------ No results for input(s): CHOL, HDL, LDLCALC, TRIG, CHOLHDL, LDLDIRECT in the last 72 hours. ------------------------------------------------------------------------------------------------------------------ No results for input(s): TSH, T4TOTAL, T3FREE, THYROIDAB in the last 72 hours.  Invalid input(s): FREET3 ------------------------------------------------------------------------------------------------------------------ No results for input(s): VITAMINB12, FOLATE, FERRITIN, TIBC, IRON, RETICCTPCT in the last 72 hours.  Coagulation profile No results for input(s): INR, PROTIME in the last 168 hours.  No results for input(s): DDIMER in  the last 72 hours.  Cardiac Enzymes No results for input(s): CKMB, TROPONINI, MYOGLOBIN in the last 168 hours.  Invalid input(s): CK ------------------------------------------------------------------------------------------------------------------ Invalid input(s): POCBNP    Yamilette Garretson D.O. on 05/09/2015 at 12:45 PM  Between 7am to 7pm - Pager - [phone]  After 7pm go to [email or web address] - password [email or web address]  And look for the night coverage person covering for me after hours  [facility] Office  [phone]

## 2015-05-09 NOTE — Care Management Obs Status (Signed)
[facility] NOTIFICATION   Patient Details  Name: Ethan Wade MRN: [id number] Date of Birth: 05-13-1950   Medicare Observation Status Notification Given:  Yes    Carles Collet, RN 05/09/2015, 3:17 PM

## 2015-05-10 DIAGNOSIS — B182 Chronic viral hepatitis C: Secondary | ICD-10-CM | POA: Diagnosis not present

## 2015-05-10 DIAGNOSIS — K7200 Acute and subacute hepatic failure without coma: Secondary | ICD-10-CM | POA: Diagnosis not present

## 2015-05-10 DIAGNOSIS — K7290 Hepatic failure, unspecified without coma: Secondary | ICD-10-CM | POA: Diagnosis not present

## 2015-05-10 DIAGNOSIS — I10 Essential (primary) hypertension: Secondary | ICD-10-CM | POA: Diagnosis not present

## 2015-05-10 DIAGNOSIS — E118 Type 2 diabetes mellitus with unspecified complications: Secondary | ICD-10-CM | POA: Diagnosis not present

## 2015-05-10 LAB — COMPREHENSIVE METABOLIC PANEL
ALK PHOS: 72 U/L (ref 38–126)
ALT: 40 U/L (ref 17–63)
ANION GAP: 10 (ref 5–15)
AST: 52 U/L — ABNORMAL HIGH (ref 15–41)
Albumin: 2.3 g/dL — ABNORMAL LOW (ref 3.5–5.0)
BUN: 22 mg/dL — ABNORMAL HIGH (ref 6–20)
CHLORIDE: 103 mmol/L (ref 101–111)
CO2: 23 mmol/L (ref 22–32)
Calcium: 8.4 mg/dL — ABNORMAL LOW (ref 8.9–10.3)
Creatinine, Ser: 0.91 mg/dL (ref 0.61–1.24)
GFR calc non Af Amer: >60 mL/min (ref >60)
Glucose, Bld: 143 mg/dL — ABNORMAL HIGH (ref 65–99)
Potassium: 3.6 mmol/L (ref 3.5–5.1)
SODIUM: 136 mmol/L (ref 135–145)
Total Bilirubin: 2.3 mg/dL — ABNORMAL HIGH (ref 0.3–1.2)
Total Protein: 6 g/dL — ABNORMAL LOW (ref 6.5–8.1)

## 2015-05-10 LAB — GLUCOSE, CAPILLARY
GLUCOSE-CAPILLARY: 134 mg/dL — AB (ref 65–99)
GLUCOSE-CAPILLARY: 137 mg/dL — AB (ref 65–99)

## 2015-05-10 LAB — CBC
HCT: 39.1 % (ref 39.0–52.0)
Hemoglobin: 14.1 g/dL (ref 13.0–17.0)
MCH: 37.3 pg — AB (ref 26.0–34.0)
MCHC: 36.1 g/dL — AB (ref 30.0–36.0)
MCV: 103.4 fL — ABNORMAL HIGH (ref 78.0–100.0)
PLATELETS: 60 10*3/uL — AB (ref 150–400)
RBC: 3.78 MIL/uL — ABNORMAL LOW (ref 4.22–5.81)
RDW: 14.6 % (ref 11.5–15.5)
WBC: 6.1 10*3/uL (ref 4.0–10.5)

## 2015-05-10 LAB — AMMONIA: Ammonia: 49 umol/L — ABNORMAL HIGH (ref 9–35)

## 2015-05-10 NOTE — Progress Notes (Signed)
Pt refused to wear the grip socks given by the hospital, refused for her SCDS to be turned on and refused to be weigh this morning, keep on saying everything cost money and therefore dont want to take anything for [place] to charge his insurance, will continue to monitor

## 2015-05-10 NOTE — Progress Notes (Signed)
Pt given discharge instructions, prescriptions, and care notes. Pt verbalized understanding AEB no further questions or concerns at this time. IV was discontinued, no redness, pain, or swelling noted at this time. Telemetry discontinued and Centralized Telemetry was notified. Pt left the floor via wheelchair with staff in stable condition. 

## 2015-05-10 NOTE — Discharge Summary (Signed)
Physician Discharge Summary  Ethan Wade [name] DOB: [birth year] DOA: 05/08/2015  PCP: Gerrit Heck, MD  Admit date: 05/08/2015 Discharge date: 05/10/2015  Time spent: 45 minutes  Recommendations for Outpatient Follow-up:  Patient will be discharged to home.  Patient will need to follow up with primary care provider within one week of discharge.  Patient should continue medications as prescribed.  Patient should follow a heart healthy/ carb modified diet.   Discharge Diagnoses:  Acute hepatic encephalopathy Hyperkalemia Acute kidney injury Essential hypertension Chronic hepatitis C with cirrhosis/liver cancer Deborah cytopenic Diabetes mellitus, type II  Discharge Condition: Stable  Diet recommendation: heart healthy/ carb modified  Filed Weights   05/09/15 1300  Weight: 63.504 kg (140 lb)    History of present illness:  on 05/08/15  Ethan Wade is a 65 y.o. male with a history of diabetes, hepatitis C, liver cirrhosis, liver cancer that presented to the emergency department with confusion. Patient's brother is at bedside and provided history. Patient was last seen normal yesterday evening when he and his further when out for dinner. Upon speaking to him this morning, patient was nonsensical on the telephone. Per brother, patient becomes confused with his ammonia level starts to rise. He states that normally the patient does take his lactulose consistently. Patient has not had fever or any type of illness recently. In the emergency department, patient was found to have hyperkalemia, potassium of 6, ammonia 152. [facility] called for admission.  Hospital Course:  Acute hepatic encephalopathy -Resolved -Ammonia level 152 upon admission -Continue lactulose  -Ammonia level 49 today -Long conversation with patient, admitted to only taking lactulose once per day due to the amount of diarrhea and lack of ability to perform his daily activities. -Patient may want to  follow up with his gastroenterologist or hepatologist regarding different medication regimen.  Hyperkalemia -Ordered hyperkalemia set upon admission -Possibly secondary to spironolactone -Resolved, continue to monitor BMP  Acute kidney injury -Resolved -Creatinine 0.91, upon admission, baseline 0.6 -Will continue to monitor BMP closely, creatinine may worsen this patient will be given lactulose -Hold spironolactone and lasix, resume at discharge  Essential hypertension -Continue atenolol with holding parameters, hold Lasix and spironolactone due to AKI  Chronic hepatitis C with cirrhosis/liver cancer -Patient follows up with physicians at [facility] and is supposed to start Harvoni in the near future -Continue Nexavar  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   Cardiovascular: S1 S2 auscultated, 2/6SEM, RRR  Respiratory: Clear to auscultation bilaterally   Abdomen: Soft, nontender, nondistended, + bowel sounds   GU: enlarged scrotal due to hernia  Extremities: warm dry without cyanosis clubbing or edema  Neuro: AAOx3, nonfocal  Psych: Appropriate mood and affect, intact insight and judgement.  Discharge Instructions      Discharge Instructions    Discharge instructions    Complete by:  As directed   Patient will be discharged to home.  Patient will need to follow up with primary care provider within one week of discharge.  Patient should continue  medications as prescribed.   Patient should follow a heart healthy/ carb modified diet.            Medication List    TAKE these medications        CLEAR EYES FOR DRY EYES 1-0.25 % Soln  Generic drug:  Carboxymethylcellul-Glycerin  Apply 1-2 drops to eye daily as needed. For dry eyes     dicyclomine 10 MG capsule  Commonly known as:  BENTYL  Take 1 capsule (10 mg total) by mouth 2 (two) times daily.     furosemide 40 MG tablet  Commonly known as:  LASIX  Take 40 mg by mouth daily.     insulin glargine 100 UNIT/ML injection  Commonly known as:  LANTUS  Inject 0.08 mLs (8 Units total) into the skin daily.     lactulose 10 GM/15ML solution  Commonly known as:  CHRONULAC  Take 45 mLs (30 g total) by mouth 3 (three) times daily.     nadolol 20 MG tablet  Commonly known as:  CORGARD  Take 20 mg by mouth daily.     ondansetron 4 MG disintegrating tablet  Commonly known as:  ZOFRAN ODT  Take 1 tablet (4 mg total) by mouth every 8 (eight) hours as needed for nausea or vomiting.     oxyCODONE 5 MG immediate release tablet  Commonly known as:  Oxy IR/ROXICODONE  Take 1 tablet (5 mg total) by mouth every 12 (twelve) hours as needed for severe pain or breakthrough pain (only for severe or breakthrough pain).     prochlorperazine 10 MG tablet  Commonly known as:  COMPAZINE  Take 10 mg by mouth every 6 (six) hours as needed for nausea or vomiting.     SORAfenib 200 MG tablet  Commonly known as:  NEXAVAR  Take 400 mg by mouth every 12 (twelve) hours. Take on an empty stomach 1 hour before or 2 hours after meals.     spironolactone 25 MG tablet  Commonly known as:  ALDACTONE  Take 1 tablet (25 mg total) by mouth daily.     tamsulosin 0.4 MG Caps capsule  Commonly known as:  FLOMAX  Take 0.4 mg by mouth daily.     Vitamin D (Ergocalciferol) 50000 units Caps capsule  Commonly known as:  DRISDOL  Take 50,000 Units by mouth every Friday.       No Known Allergies Follow-up Information    Follow up  with Gerrit Heck, MD. Schedule an appointment as soon as possible for a visit in 1 week.   Specialty:  Family Medicine   Why:  Hospital Follow-up   Contact information:   [place] [address] [phone]        The results of significant diagnostics from this hospitalization (including imaging, microbiology, ancillary and laboratory) are listed below for reference.    Significant Diagnostic Studies: US Abdomen Limited  04/16/2015  CLINICAL DATA:  Evaluate for ascites. EXAM: LIMITED ABDOMEN ULTRASOUND FOR ASCITES TECHNIQUE: Limited ultrasound survey for ascites was performed in all four abdominal quadrants. COMPARISON:  Ultrasound abdomen 02/25/2015 FINDINGS: No significant free fluid identified within all 4 quadrants. IMPRESSION: No significant fluid within the abdomen at this time. Electronically Signed   By: Lovey Newcomer M.D.   On: 04/16/2015 10:25   Dg Chest Portable 1 View  05/08/2015  CLINICAL DATA:  Altered mental status, no chest pain or shortness of breath EXAM: PORTABLE CHEST 1 VIEW COMPARISON:  04/01/2014 FINDINGS: The heart  size and mediastinal contours are within normal limits. Both lungs are clear. The visualized skeletal structures are unremarkable. IMPRESSION: No active disease. Electronically Signed   By: Kathreen Devoid   On: 05/08/2015 17:56    Microbiology: No results found for this or any previous visit (from the past 240 hour(s)).   Labs: Basic Metabolic Panel:  Recent Labs Lab 05/08/15 1433 05/08/15 2120 05/09/15 0726 05/09/15 1205 05/10/15 0608  NA 131* 135 134*  --  136  K 6.0* 5.4* 4.6 4.7 3.6  CL 97* 97* 98*  --  103  CO2 23 25 23   --  23  GLUCOSE 145* 103* 120*  --  143*  BUN 21* 23* 27*  --  22*  CREATININE 1.30* 1.33* 1.34*  --  0.91  CALCIUM 9.3 9.2 8.7*  --  8.4*   Liver Function Tests:  Recent Labs Lab 05/08/15 1433 05/08/15 2120 05/10/15 0608  AST 70* 63* 52*  ALT 51 45 40  ALKPHOS 89 82 72  BILITOT  3.6* 3.6* 2.3*  PROT 7.1 6.6 6.0*  ALBUMIN 2.9* 2.7* 2.3*   No results for input(s): LIPASE, AMYLASE in the last 168 hours.  Recent Labs Lab 05/08/15 1433 05/09/15 0726 05/10/15 0648  AMMONIA 152* 75* 49*   CBC:  Recent Labs Lab 05/08/15 1433 05/08/15 2120 05/10/15 0608  WBC 7.9 6.7 6.1  HGB 16.0 15.5 14.1  HCT 44.0 42.5 39.1  MCV 100.9* 101.7* 103.4*  PLT 78* 73* 60*   Cardiac Enzymes: No results for input(s): CKTOTAL, CKMB, CKMBINDEX, TROPONINI in the last 168 hours. BNP: BNP (last 3 results) No results for input(s): BNP in the last 8760 hours.  ProBNP (last 3 results) No results for input(s): PROBNP in the last 8760 hours.  CBG:  Recent Labs Lab 05/09/15 0812 05/09/15 1220 05/09/15 1643 05/09/15 2205 05/10/15 0750  GLUCAP 105* 195* 231* 181* 134*       Signed:  Viviane Semidey  [facility] 05/10/2015, 11:39 AM

## 2015-05-10 NOTE — Discharge Instructions (Signed)
Hepatic Encephalopathy Hepatic encephalopathy is a loss of brain function from advanced liver disease. The effects of the condition depend on the type of liver damage and how severe it is. In some cases, hepatic encephalopathy can be reversed. CAUSES The exact cause of hepatic encephalopathy is not known. RISK FACTORS You have a higher risk of getting this condition if your liver is damaged. When the liver is damaged harmful substances called toxins can build up in the body. Certain toxins, such as ammonia, can harm your brain. Conditions that can cause liver damage include:  An infection.  Dehydration.  Intestinal bleeding.  Drinking too much alcohol.  Taking certain medicines, including tranquilizers, water pills (diuretics), antidepressants, or sleeping pills. SIGNS AND SYMPTOMS Signs and symptoms may develop suddenly. Or, they may develop slowly and get worse gradually. Symptoms can range from mild to severe. Mild Hepatic Encephalopathy  Mild confusion.  Personality and mood changes.  Anxiety and agitation.  Drowsiness.  Loss of mental abilities.  Musty or sweet-smelling breath. Worsening or Severe Hepatic Encephalopathy  Slowed movement.  Slurred speech.  Extreme personality changes.  Disorientation.  Abnormal shaking or flapping of the hands.  Coma. DIAGNOSIS To make a diagnosis, your health care provider will do a physical exam. To rule out other causes of your signs and symptoms, he or she may order tests. You may have:  Blood tests. These may be done to check your ammonia level, measure how long it takes your blood to clot, and check for infection.  Liver function tests. These may be done to check how well your liver is working.  MRI and CT scans. These may be done to check for a brain disorder.  Electroencephalogram (EEG). This may be done to measure the electrical activity in your brain. TREATMENT The first step in treatment is identifying and  treating possible triggers. The next step is involves taking medicine to lower the level of toxins in the body and to prevent ammonia from building up. You may need to take:  Antibiotics to reduce the ammonia-producing bacteria in your gut.  Lactulose to help flush ammonia from the gut. HOME CARE INSTRUCTIONS Eating and Drinking  Follow a low-protein diet that includes plenty of fruits, vegetables, and whole grains, as directed by your health care provider. Ammonia is produced when you digest high-protein foods.  Work with a dietitian or with your health care provider to make sure you are getting the right balance of protein and minerals.  Drink enough fluids to keep your urine clear or pale yellow. Drinking plenty of water helps prevent constipation.  Do not drink alcohol or use illegal drugs. Medicines  Only take medicine as directed by your health care provider.  If you were prescribed an antibiotic medicine, finish it all even if you start to feel better.  Do not start any new medicines, including over-the-counter medicines, without first checking with your health care provider. SEEK MEDICAL CARE IF:  You have new symptoms.  Your symptoms change.  Your symptoms get worse.  You have a fever.  You are constipated.  You have persistent nausea, vomiting, or diarrhea. SEEK IMMEDIATE MEDICAL CARE IF:  You become very confused or drowsy.  You vomit blood or material that looks like coffee grounds.  Your stool is bloody or black or looks like tar.   This information is not intended to replace advice given to you by your health care provider. Make sure you discuss any questions you have with your health care provider.     Document Released: 05/26/2006 Document Revised: 04/06/2014 Document Reviewed: 11/01/2013 Elsevier Interactive Patient Education 2016 [employer]  

## 2015-05-10 NOTE — Care Management Note (Signed)
Case Management Note  Patient Details  Name: Baldwin Kristiansen MRN: [id number] Date of Birth: July 21, 1950  Subjective/Objective:                 Independent patient from home who lives with his brother, admitted with increased ammonia level.   Action/Plan:  No CM needs identified at this time, will DC to home.  Expected Discharge Date:                  Expected Discharge Plan:  Home/Self Care  In-House Referral:     Discharge planning Services  CM Consult  Post Acute Care Choice:    Choice offered to:     DME Arranged:    DME Agency:     HH Arranged:    [place] Agency:     Status of Service:  Completed, signed off  Medicare Important Message Given:    Date Medicare IM Given:    Medicare IM give by:    Date Additional Medicare IM Given:    Additional Medicare Important Message give by:     If discussed at [place] of Stay Meetings, dates discussed:    Additional Comments:  Carles Collet, RN 05/10/2015, 12:32 PM

## 2015-05-20 ENCOUNTER — Inpatient Hospital Stay (HOSPITAL_COMMUNITY)
Admission: EM | Admit: 2015-05-20 | Discharge: 2015-05-22 | DRG: 436 | Disposition: A | Discharge status: Home / Self Care | Payer: Medicare Other | Attending: Internal Medicine | Admitting: Internal Medicine

## 2015-05-20 ENCOUNTER — Encounter (HOSPITAL_COMMUNITY): Payer: Self-pay

## 2015-05-20 DIAGNOSIS — Z8249 Family history of ischemic heart disease and other diseases of the circulatory system: Secondary | ICD-10-CM

## 2015-05-20 DIAGNOSIS — C220 Liver cell carcinoma: Principal | ICD-10-CM | POA: Diagnosis present

## 2015-05-20 DIAGNOSIS — E440 Moderate protein-calorie malnutrition: Secondary | ICD-10-CM | POA: Diagnosis present

## 2015-05-20 DIAGNOSIS — I341 Nonrheumatic mitral (valve) prolapse: Secondary | ICD-10-CM | POA: Diagnosis present

## 2015-05-20 DIAGNOSIS — Z681 Body mass index (BMI) 19 or less, adult: Secondary | ICD-10-CM | POA: Diagnosis not present

## 2015-05-20 DIAGNOSIS — Z833 Family history of diabetes mellitus: Secondary | ICD-10-CM

## 2015-05-20 DIAGNOSIS — N400 Enlarged prostate without lower urinary tract symptoms: Secondary | ICD-10-CM | POA: Diagnosis present

## 2015-05-20 DIAGNOSIS — Z825 Family history of asthma and other chronic lower respiratory diseases: Secondary | ICD-10-CM

## 2015-05-20 DIAGNOSIS — K7460 Unspecified cirrhosis of liver: Secondary | ICD-10-CM | POA: Diagnosis present

## 2015-05-20 DIAGNOSIS — B1920 Unspecified viral hepatitis C without hepatic coma: Secondary | ICD-10-CM | POA: Diagnosis present

## 2015-05-20 DIAGNOSIS — E860 Dehydration: Secondary | ICD-10-CM | POA: Insufficient documentation

## 2015-05-20 DIAGNOSIS — Z794 Long term (current) use of insulin: Secondary | ICD-10-CM | POA: Diagnosis not present

## 2015-05-20 DIAGNOSIS — E559 Vitamin D deficiency, unspecified: Secondary | ICD-10-CM | POA: Diagnosis present

## 2015-05-20 DIAGNOSIS — E1165 Type 2 diabetes mellitus with hyperglycemia: Secondary | ICD-10-CM | POA: Diagnosis present

## 2015-05-20 DIAGNOSIS — Z8042 Family history of malignant neoplasm of prostate: Secondary | ICD-10-CM

## 2015-05-20 DIAGNOSIS — Z79899 Other long term (current) drug therapy: Secondary | ICD-10-CM

## 2015-05-20 DIAGNOSIS — E1151 Type 2 diabetes mellitus with diabetic peripheral angiopathy without gangrene: Secondary | ICD-10-CM | POA: Diagnosis not present

## 2015-05-20 DIAGNOSIS — M1990 Unspecified osteoarthritis, unspecified site: Secondary | ICD-10-CM | POA: Diagnosis present

## 2015-05-20 DIAGNOSIS — R011 Cardiac murmur, unspecified: Secondary | ICD-10-CM | POA: Diagnosis present

## 2015-05-20 DIAGNOSIS — I10 Essential (primary) hypertension: Secondary | ICD-10-CM | POA: Diagnosis present

## 2015-05-20 DIAGNOSIS — D696 Thrombocytopenia, unspecified: Secondary | ICD-10-CM | POA: Diagnosis present

## 2015-05-20 DIAGNOSIS — I739 Peripheral vascular disease, unspecified: Secondary | ICD-10-CM | POA: Diagnosis present

## 2015-05-20 DIAGNOSIS — K7290 Hepatic failure, unspecified without coma: Secondary | ICD-10-CM | POA: Diagnosis present

## 2015-05-20 DIAGNOSIS — R4182 Altered mental status, unspecified: Secondary | ICD-10-CM | POA: Diagnosis not present

## 2015-05-20 DIAGNOSIS — E119 Type 2 diabetes mellitus without complications: Secondary | ICD-10-CM

## 2015-05-20 DIAGNOSIS — G9340 Encephalopathy, unspecified: Secondary | ICD-10-CM

## 2015-05-20 DIAGNOSIS — Z800 Family history of malignant neoplasm of digestive organs: Secondary | ICD-10-CM | POA: Diagnosis not present

## 2015-05-20 DIAGNOSIS — K7682 Hepatic encephalopathy: Secondary | ICD-10-CM | POA: Diagnosis present

## 2015-05-20 LAB — CBC WITH DIFFERENTIAL/PLATELET
Basophils Absolute: 0 10*3/uL (ref 0.0–0.1)
Basophils Relative: 1 %
EOS PCT: 2 %
Eosinophils Absolute: 0.1 10*3/uL (ref 0.0–0.7)
HCT: 44.9 % (ref 39.0–52.0)
Hemoglobin: 15.4 g/dL (ref 13.0–17.0)
LYMPHS ABS: 1.1 10*3/uL (ref 0.7–4.0)
LYMPHS PCT: 20 %
MCH: 36.5 pg — AB (ref 26.0–34.0)
MCHC: 34.3 g/dL (ref 30.0–36.0)
MCV: 106.4 fL — AB (ref 78.0–100.0)
MONO ABS: 0.4 10*3/uL (ref 0.1–1.0)
Monocytes Relative: 8 %
Neutro Abs: 3.7 10*3/uL (ref 1.7–7.7)
Neutrophils Relative %: 69 %
PLATELETS: 77 10*3/uL — AB (ref 150–400)
RBC: 4.22 MIL/uL (ref 4.22–5.81)
RDW: 14.8 % (ref 11.5–15.5)
WBC: 5.4 10*3/uL (ref 4.0–10.5)

## 2015-05-20 LAB — COMPREHENSIVE METABOLIC PANEL
ALK PHOS: 78 U/L (ref 38–126)
ALT: 46 U/L (ref 17–63)
AST: 70 U/L — ABNORMAL HIGH (ref 15–41)
Albumin: 3 g/dL — ABNORMAL LOW (ref 3.5–5.0)
Anion gap: 13 (ref 5–15)
BILIRUBIN TOTAL: 3.5 mg/dL — AB (ref 0.3–1.2)
BUN: 33 mg/dL — ABNORMAL HIGH (ref 6–20)
CALCIUM: 8.8 mg/dL — AB (ref 8.9–10.3)
CHLORIDE: 99 mmol/L — AB (ref 101–111)
CO2: 23 mmol/L (ref 22–32)
CREATININE: 1.11 mg/dL (ref 0.61–1.24)
Glucose, Bld: 91 mg/dL (ref 65–99)
Potassium: 5.1 mmol/L (ref 3.5–5.1)
Sodium: 135 mmol/L (ref 135–145)
TOTAL PROTEIN: 6.7 g/dL (ref 6.5–8.1)

## 2015-05-20 LAB — ETHANOL

## 2015-05-20 LAB — AMMONIA: AMMONIA: 104 umol/L — AB (ref 9–35)

## 2015-05-20 LAB — CBG MONITORING, ED
GLUCOSE-CAPILLARY: 103 mg/dL — AB (ref 65–99)
Glucose-Capillary: 79 mg/dL (ref 65–99)

## 2015-05-20 LAB — GLUCOSE, CAPILLARY: GLUCOSE-CAPILLARY: 99 mg/dL (ref 65–99)

## 2015-05-20 MED ORDER — SODIUM CHLORIDE 0.9 % IV BOLUS (SEPSIS)
500.0000 mL | Freq: Once | INTRAVENOUS | Status: AC
  Administered 2015-05-20: 500 mL via INTRAVENOUS

## 2015-05-20 MED ORDER — CARBOXYMETHYLCELLUL-GLYCERIN 1-0.25 % OP SOLN: 1.0000 [drp] | Freq: Every day | OPHTHALMIC | Status: DC | PRN

## 2015-05-20 MED ORDER — TAMSULOSIN HCL 0.4 MG PO CAPS
0.4000 mg | ORAL_CAPSULE | Freq: Every day | ORAL | Status: DC
  Administered 2015-05-20 – 2015-05-22 (×3): 0.4 mg via ORAL
  Filled 2015-05-20 (×3): qty 1

## 2015-05-20 MED ORDER — ONDANSETRON 4 MG PO TBDP: 4.0000 mg | ORAL_TABLET | Freq: Three times a day (TID) | ORAL | Status: DC | PRN

## 2015-05-20 MED ORDER — POLYVINYL ALCOHOL 1.4 % OP SOLN
1.0000 [drp] | Freq: Every day | OPHTHALMIC | Status: DC | PRN
  Filled 2015-05-20: qty 15

## 2015-05-20 MED ORDER — LACTULOSE 10 GM/15ML PO SOLN
30.0000 g | Freq: Three times a day (TID) | ORAL | Status: DC
  Administered 2015-05-20 – 2015-05-21 (×2): 30 g via ORAL
  Administered 2015-05-21: 20 g via ORAL
  Administered 2015-05-21 – 2015-05-22 (×2): 30 g via ORAL
  Filled 2015-05-20 (×7): qty 45

## 2015-05-20 MED ORDER — NADOLOL 20 MG PO TABS
20.0000 mg | ORAL_TABLET | Freq: Every day | ORAL | Status: DC
  Administered 2015-05-20 – 2015-05-22 (×2): 20 mg via ORAL
  Filled 2015-05-20 (×4): qty 1

## 2015-05-20 MED ORDER — SODIUM CHLORIDE 0.9 % IV SOLN
INTRAVENOUS | Status: DC
  Administered 2015-05-20: 50 mL/h via INTRAVENOUS
  Administered 2015-05-21: 1000 mL via INTRAVENOUS

## 2015-05-20 MED ORDER — SORAFENIB TOSYLATE 200 MG PO TABS
400.0000 mg | ORAL_TABLET | Freq: Two times a day (BID) | ORAL | Status: DC
  Administered 2015-05-21 – 2015-05-22 (×2): 400 mg via ORAL
  Filled 2015-05-20: qty 2

## 2015-05-20 MED ORDER — VITAMIN D (ERGOCALCIFEROL) 1.25 MG (50000 UNIT) PO CAPS: 50000.0000 [IU] | ORAL_CAPSULE | ORAL | Status: DC

## 2015-05-20 MED ORDER — INSULIN ASPART 100 UNIT/ML ~~LOC~~ SOLN
0.0000 [IU] | Freq: Three times a day (TID) | SUBCUTANEOUS | Status: DC
  Administered 2015-05-21 – 2015-05-22 (×3): 2 [IU] via SUBCUTANEOUS

## 2015-05-20 MED ORDER — ENOXAPARIN SODIUM 40 MG/0.4ML ~~LOC~~ SOLN
40.0000 mg | SUBCUTANEOUS | Status: DC
  Administered 2015-05-20 – 2015-05-21 (×2): 40 mg via SUBCUTANEOUS
  Filled 2015-05-20 (×3): qty 0.4

## 2015-05-20 MED ORDER — PROCHLORPERAZINE MALEATE 10 MG PO TABS
10.0000 mg | ORAL_TABLET | Freq: Four times a day (QID) | ORAL | Status: DC | PRN
  Filled 2015-05-20: qty 1

## 2015-05-20 MED ORDER — DICYCLOMINE HCL 10 MG PO CAPS
10.0000 mg | ORAL_CAPSULE | Freq: Two times a day (BID) | ORAL | Status: DC
  Administered 2015-05-20 – 2015-05-22 (×4): 10 mg via ORAL
  Filled 2015-05-20 (×6): qty 1

## 2015-05-20 MED ORDER — LACTULOSE 10 GM/15ML PO SOLN
30.0000 g | Freq: Once | ORAL | Status: AC
  Administered 2015-05-20: 30 g via ORAL
  Filled 2015-05-20: qty 45

## 2015-05-20 NOTE — ED Notes (Signed)
No bed available in admission room 1527. Receiving unit to call when bed available.

## 2015-05-20 NOTE — ED Provider Notes (Signed)
CSN: [id number]     Arrival date & time 05/20/15  [id number] History   First MD Initiated Contact with Patient 05/20/15 [phone]     Chief Complaint  Patient presents with  . Altered Mental Status     (Consider location/radiation/quality/duration/timing/severity/associated sxs/prior Treatment) Patient is a 65 y.o. male presenting with altered mental status. The history is provided by a relative. The history is limited by the condition of the patient.  Altered Mental Status Presenting symptoms: confusion   Severity:  Severe Most recent episode: began mild last night, more severe today. Episode history:  Continuous Timing:  Constant Progression:  Worsening Chronicity:  Recurrent Context: not head injury, not a recent illness and not a recent infection   Context comment:  Hx of liver cancer, hepatic encephalopathy Associated symptoms: no abdominal pain, no fever, no headaches, no nausea, no rash, no vomiting and no weakness     Past Medical History  Diagnosis Date  . Macrocytosis   . Thrombocytopenia ([place])   . Esophageal bleed, non-variceal 2006  . HTN (hypertension)   . Psoriasis   . Mitral valve prolapse     OCCAS FLUTTER FEELING  . Vitamin D deficiency   . Umbilical hernia     OCCAS DISCOMFORT  . Prostate enlargement     PT TOLD "NORMAL FOR AGE" - TAKES FINASTERIDE AND FLOMAX  . Heart murmur   . Type II diabetes mellitus ([place])   . Rheumatic fever 1950's  . Hepatitis C 1980's  . Arthritis     "joint stiffness in the knees" (11/10/2013)  . Liver cancer ([place])   . Ascites   . Hepatic encephalopathy ([place]) 05/2015   Past Surgical History  Procedure Laterality Date  . Tonsilectomy, adenoidectomy, bilateral myringotomy and tubes  child  . Umbilical hernia repair  03/09/2012    Procedure: HERNIA REPAIR UMBILICAL ADULT;  Surgeon: Edward Jolly, MD;  Location: [facility];  Service: General;  Laterality: N/A;  Repair Umbilical Hernia with Mesh  . Insertion of mesh  03/09/2012   Procedure: INSERTION OF MESH;  Surgeon: Edward Jolly, MD;  Location: [facility];  Service: General;  Laterality: N/A;  . Inguinal hernia repair Left 1970's  . Hernia repair    . Tonsillectomy     Family History  Problem Relation Age of Onset  . Prostate cancer Brother   . COPD Mother   . Heart disease Father   . Diabetes Father   . Alcohol abuse Father   . Liver cancer Brother   . Colon cancer Brother   . COPD Maternal Grandfather    Social History  Substance Use Topics  . Smoking status: Former Smoker -- 20 years    Types: Cigars    Start date: 12/22/2005  . Smokeless tobacco: Former Systems developer    Types: Snuff     Comment: "quit smoking ~ 2009; quit snuff in ~ 2013"  . Alcohol Use: Yes     Comment: "quit in late April 2015"    Review of Systems  Unable to perform ROS: Mental status change  Constitutional: Negative for fever.  Respiratory: Negative for cough and shortness of breath.   Gastrointestinal: Negative for nausea, vomiting, abdominal pain and blood in stool (pt with BM on arrival, no blood per nursing staff).  Genitourinary: Negative for dysuria.  Skin: Negative for rash.  Neurological: Negative for weakness and headaches.  Psychiatric/Behavioral: Positive for confusion.      Allergies  Review of patient's allergies indicates no known allergies.  Home Medications   Prior to Admission medications   Medication Sig Start Date End Date Taking? Authorizing Provider  Carboxymethylcellul-Glycerin (CLEAR EYES FOR DRY EYES) 1-0.25 % SOLN Apply 1-2 drops to eye daily as needed (for dry eyes). Reported on 05/20/2015   Yes Historical Provider, MD  dicyclomine (BENTYL) 10 MG capsule Take 1 capsule (10 mg total) by mouth 2 (two) times daily. 11/13/13  Yes Ripudeep Krystal Eaton, MD  furosemide (LASIX) 40 MG tablet Take 40 mg by mouth daily.   Yes Historical Provider, MD  insulin glargine (LANTUS) 100 UNIT/ML injection Inject 0.08 mLs (8 Units total) into the skin daily. 04/04/14  Yes  Shanker Kristeen Mans, MD  lactulose (CHRONULAC) 10 GM/15ML solution Take 45 mLs (30 g total) by mouth 3 (three) times daily. 02/25/15  Yes Reyne Dumas, MD  nadolol (CORGARD) 20 MG tablet Take 20 mg by mouth daily.   Yes Historical Provider, MD  ondansetron (ZOFRAN ODT) 4 MG disintegrating tablet Take 1 tablet (4 mg total) by mouth every 8 (eight) hours as needed for nausea or vomiting. 11/13/13  Yes Ripudeep Krystal Eaton, MD  oxyCODONE (OXY IR/ROXICODONE) 5 MG immediate release tablet Take 1 tablet (5 mg total) by mouth every 12 (twelve) hours as needed for severe pain or breakthrough pain (only for severe or breakthrough pain). Patient taking differently: Take 5 mg by mouth every 4 (four) hours as needed (pain).  11/13/13  Yes Ripudeep Krystal Eaton, MD  prochlorperazine (COMPAZINE) 10 MG tablet Take 10 mg by mouth every 6 (six) hours as needed for nausea or vomiting.   Yes Historical Provider, MD  SORAfenib (NEXAVAR) 200 MG tablet Take 400 mg by mouth every 12 (twelve) hours. Take on an empty stomach 1 hour before or 2 hours after meals.   Yes Historical Provider, MD  spironolactone (ALDACTONE) 100 MG tablet Take 100 mg by mouth daily.   Yes Historical Provider, MD  Tamsulosin HCl (FLOMAX) 0.4 MG CAPS Take 0.4 mg by mouth daily.   Yes Historical Provider, MD  Vitamin D, Ergocalciferol, (DRISDOL) 50000 UNITS CAPS Take 50,000 Units by mouth every Friday.   Yes Historical Provider, MD  XIFAXAN 550 MG TABS tablet Take 550 mg by mouth 2 (two) times daily. 05/17/15  Yes Historical Provider, MD   BP 117/73 mmHg  Pulse 74  Temp(Src) 98.5 F (36.9 C) (Oral)  Resp 17  SpO2 100% Physical Exam  Constitutional: He is oriented to person, place, and time. He appears well-developed. He appears cachectic. He appears ill. No distress.  HENT:  Head: Normocephalic and atraumatic.  atraumatic  Eyes: Conjunctivae and EOM are normal.  Neck: Normal range of motion.  Cardiovascular: Normal rate, regular rhythm, normal heart sounds  and intact distal pulses.  Exam reveals no gallop and no friction rub.   No murmur heard. Pulmonary/Chest: Effort normal and breath sounds normal. No respiratory distress. He has no wheezes. He has no rales.  Abdominal: Soft. He exhibits no distension. There is no tenderness. There is no guarding.  Musculoskeletal: He exhibits no edema.  Neurological: He is alert and oriented to person, place, and time. GCS eye subscore is 4. GCS verbal subscore is 3. GCS motor subscore is 5.  Will move all 4 extremities spontaneously and to pain Symmetric facies Will respond to nearly all questions with "yes" Oriented to self  Skin: Skin is warm and dry. He is not diaphoretic.  Nursing note and vitals reviewed.   ED Course  Procedures (including critical care time)  Labs Review Labs Reviewed  CBC WITH DIFFERENTIAL/PLATELET - Abnormal; Notable for the following:    MCV 106.4 (*)    MCH 36.5 (*)    Platelets 77 (*)    All other components within normal limits  COMPREHENSIVE METABOLIC PANEL - Abnormal; Notable for the following:    Chloride 99 (*)    BUN 33 (*)    Calcium 8.8 (*)    Albumin 3.0 (*)    AST 70 (*)    Total Bilirubin 3.5 (*)    All other components within normal limits  AMMONIA - Abnormal; Notable for the following:    Ammonia 104 (*)    All other components within normal limits  CBG MONITORING, ED - Abnormal; Notable for the following:    Glucose-Capillary 103 (*)    All other components within normal limits  URINE CULTURE  ETHANOL  URINALYSIS, ROUTINE W REFLEX MICROSCOPIC (NOT AT [facility])  AMMONIA  CBG MONITORING, ED    Imaging Review No results found. I have personally reviewed and evaluated these images and lab results as part of my medical decision-making.   EKG Interpretation None      MDM   Final diagnoses:  Hepatocellular carcinoma ([place])  Acute encephalopathy  Altered mental status, unspecified altered mental status type   65yo male with history of  hepatocellular carcinoma, hepatic encephalopathy, hypertension, DM, hepatitis C, mitral valve prolapse, presents with concern for altered mental status starting last night and worsening today.  No history or physical findings of trauma, doubt ICH.  No history or signs of infection. Ammonia 104. Patient likely with hepatic encephalopathy.  Gave lactulose. Will admit to medicine for further care.     Gareth Morgan, MD 05/20/15 [phone]

## 2015-05-20 NOTE — Progress Notes (Signed)
CM reviewed in details medicare guidelines, home health [facility]) (length of stay in home, types of [facility] staff available, coverage, primary caregiver, up to 24 hrs before services may be started) and Private duty nursing (PDN-coverage, length of stay in the home types of staff available). CM reviewed availability of [facility] to assist pcp to get pt to snf (if desired disposition) from the community level. CM provided pt/brother -charles at bedside with a list of [place] home health agencies and PDN.  Juanda Crumble wants pt input - awaiting ammonia level to decrease so that pt is more alert to participate in his choice of home health Pt noted to state "yep" to all questions asks during CM assessment   Discussed pt to be further evaluated by unit therapists (PT/OT) for recommendation of level of care and share this with attending MD and unit CM Pt has rw, shower chair at home Lives alone and Juanda Crumble lives near him Juanda Crumble states lately pt has not been able to use any of his DME

## 2015-05-20 NOTE — ED Notes (Signed)
Per EMS- Patient has a history of liver cancer. Call came for altered mental status. Patient unable to follow commands or answer questions appropiately.

## 2015-05-20 NOTE — ED Notes (Addendum)
MD Schlossman made aware PT unable to give urine at this time.

## 2015-05-20 NOTE — Progress Notes (Addendum)
Pt noted with a B/P of 79/50 at 22:03. Call out to provider to advise. Noted orders placed for a NS bolus of 500 ml to give.  Will follow up and monitor blood pressure per doctors orders and unit protocol. Bed exit alarm placed and phone with in reach.   0240am Noted after initial NS 500 cc bolus B/P at 0000 was 94/58. Recheck manually at 0226 B/P @ 80/50. New orders given for NS 250 cc Bolus and to recheck manually for follow up.

## 2015-05-20 NOTE — H&P (Addendum)
History and Physical:    Ethan Wade   [name] DOB: Jul 06, 1950 DOA: 05/20/2015  Referring MD/provider: Gareth Morgan, MD PCP: Gerrit Heck, MD   Chief Complaint: Progressive confusion 2 days  History of Present Illness:  Historian is patient's brother.  Ethan Wade is an 65 y.o. male with a history of cirrhosis, hepatitis C (currently being treated), hepatocellular carcinoma (currently being treated at [facility]). According to the patient's brother he was in his usual state of health up until 2 days ago. The brother noted that yesterday he started becoming somewhat confused this got progressively worse, however the patient was refusing to be seen at the hospital. His brother went to see him this morning and he wasn't recognized to Brother thus he called EMS and had him brought to the emergency room. The patient is unable to contribute to the history due to his state of confusion. However according to the brother he had no complaints in the last several days. The patient brother reports that he has not had any alcohol in about 2 years. In the emergency room laboratory studies revealed an ammonia level of 104. His BUN and creatinine were marginally elevated otherwise laboratory studies are at baseline.  Currently the patient is on Nexavar 400 mg twice a day for treatment of hepatocellular carcinoma. A review of his records from [facility] shows that his baseline total bilirubin is around 2.8. BUN 15 creatinine 0.8 on 04/22/2015.    ROS:   ROS: Patient unable to contribute to review of systems secondary to encephalopathy.   Past Medical History:   Past Medical History  Diagnosis Date  . Macrocytosis   . Thrombocytopenia ([place])   . Esophageal bleed, non-variceal 2006  . HTN (hypertension)   . Psoriasis   . Mitral valve prolapse     OCCAS FLUTTER FEELING  . Vitamin D deficiency   . Umbilical hernia     OCCAS DISCOMFORT  . Prostate enlargement     PT  TOLD "NORMAL FOR AGE" - TAKES FINASTERIDE AND FLOMAX  . Heart murmur   . Type II diabetes mellitus ([place])   . Rheumatic fever 1950's  . Hepatitis C 1980's  . Arthritis     "joint stiffness in the knees" (11/10/2013)  . Liver cancer ([place])   . Ascites   . Hepatic encephalopathy ([place]) 05/2015    Past Surgical History:   Past Surgical History  Procedure Laterality Date  . Tonsilectomy, adenoidectomy, bilateral myringotomy and tubes  child  . Umbilical hernia repair  03/09/2012    Procedure: HERNIA REPAIR UMBILICAL ADULT;  Surgeon: Edward Jolly, MD;  Location: [facility];  Service: General;  Laterality: N/A;  Repair Umbilical Hernia with Mesh  . Insertion of mesh  03/09/2012    Procedure: INSERTION OF MESH;  Surgeon: Edward Jolly, MD;  Location: [facility];  Service: General;  Laterality: N/A;  . Inguinal hernia repair Left 1970's  . Hernia repair    . Tonsillectomy      Social History:   Social History   Social History  . Marital Status: Single    Spouse Name: N/A  . Number of Children: N/A  . Years of Education: N/A   Occupational History  . Not on file.   Social History Main Topics  . Smoking status: Former Smoker -- 20 years    Types: Cigars    Start date: 12/22/2005  . Smokeless tobacco: Former Systems developer    Types: Snuff     Comment: "  quit smoking ~ 2009; quit snuff in ~ 2013"  . Alcohol Use: Yes     Comment: "quit in late April 2015"  . 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 Sexual Activity: No   Other Topics Concern  . Not on file   Social History Narrative    Family history:   Family History  Problem Relation Age of Onset  . Prostate cancer Brother   . COPD Mother   . Heart disease Father   . Diabetes Father   . Alcohol abuse Father   . Liver cancer Brother   . Colon cancer Brother   . COPD Maternal Grandfather     Allergies   Review of patient's allergies indicates no known  allergies.  Current Medications:   Prior to Admission medications   Medication Sig Start Date End Date Taking? Authorizing Provider  Carboxymethylcellul-Glycerin (CLEAR EYES FOR DRY EYES) 1-0.25 % SOLN Apply 1-2 drops to eye daily as needed (for dry eyes). Reported on 05/20/2015   Yes Historical Provider, MD  dicyclomine (BENTYL) 10 MG capsule Take 1 capsule (10 mg total) by mouth 2 (two) times daily. 11/13/13  Yes Ripudeep Krystal Eaton, MD  furosemide (LASIX) 40 MG tablet Take 40 mg by mouth daily.   Yes Historical Provider, MD  insulin glargine (LANTUS) 100 UNIT/ML injection Inject 0.08 mLs (8 Units total) into the skin daily. 04/04/14  Yes Shanker Kristeen Mans, MD  lactulose (CHRONULAC) 10 GM/15ML solution Take 45 mLs (30 g total) by mouth 3 (three) times daily. 02/25/15  Yes Reyne Dumas, MD  nadolol (CORGARD) 20 MG tablet Take 20 mg by mouth daily.   Yes Historical Provider, MD  ondansetron (ZOFRAN ODT) 4 MG disintegrating tablet Take 1 tablet (4 mg total) by mouth every 8 (eight) hours as needed for nausea or vomiting. 11/13/13  Yes Ripudeep Krystal Eaton, MD  oxyCODONE (OXY IR/ROXICODONE) 5 MG immediate release tablet Take 1 tablet (5 mg total) by mouth every 12 (twelve) hours as needed for severe pain or breakthrough pain (only for severe or breakthrough pain). Patient taking differently: Take 5 mg by mouth every 4 (four) hours as needed (pain).  11/13/13  Yes Ripudeep Krystal Eaton, MD  prochlorperazine (COMPAZINE) 10 MG tablet Take 10 mg by mouth every 6 (six) hours as needed for nausea or vomiting.   Yes Historical Provider, MD  SORAfenib (NEXAVAR) 200 MG tablet Take 400 mg by mouth every 12 (twelve) hours. Take on an empty stomach 1 hour before or 2 hours after meals.   Yes Historical Provider, MD  spironolactone (ALDACTONE) 25 MG tablet Take 1 tablet (25 mg total) by mouth daily. 04/04/14  Yes Shanker Kristeen Mans, MD  Tamsulosin HCl (FLOMAX) 0.4 MG CAPS Take 0.4 mg by mouth daily.   Yes Historical Provider, MD  Vitamin  D, Ergocalciferol, (DRISDOL) 50000 UNITS CAPS Take 50,000 Units by mouth every Friday.   Yes Historical Provider, MD    Physical Exam:   Filed Vitals:   05/20/15 1225 05/20/15 1230 05/20/15 1300 05/20/15 1330  BP: 113/80 124/79 101/84 110/87  Pulse: 66 72 69 69  Temp: 98.4 F (36.9 C)     TempSrc: Oral     Resp: 14 19 15 15   SpO2: 95% 95% 92% 98%     Physical Exam: Blood pressure 110/87, pulse 69, temperature 98.4 F (36.9 C), temperature source Oral, resp. rate 15, SpO2 98 %.  Gen: No acute distress. However encephalopathic and inconsistently oriented to name only. Head: Normocephalic, atraumatic. Eyes: PERRL, EOMI, sclerae nonicteric. Mouth: Oropharynx moist, no exudates/ erythema noted Neck: Supple, no thyromegaly, no lymphadenopathy, no jugular venous distention. Chest: Lungs CTA. No wheezing, rales or ronchi noted. CV: Heart sounds S1, S2 normal. No M/R/G noted. Abdomen: Soft, mild epigastric tenderness noted, nondistended with normal active bowel sounds. Extremities: Extremities show no C/C/E however both LE's are cold to touch and pulses non-palpable but posterior Tibialis pulse obtained by doppler. Skin: Warm and dry except distal LE's. Neuro: Alert and oriented only to name; grossly nonfocal.    Data Review:    Labs: Basic Metabolic Panel:  Recent Labs Lab 05/20/15 1037  NA 135  K 5.1  CL 99*  CO2 23  GLUCOSE 91  BUN 33*  CREATININE 1.11  CALCIUM 8.8*   Liver Function Tests:  Recent Labs Lab 05/20/15 1037  AST 70*  ALT 46  ALKPHOS 78  BILITOT 3.5*  PROT 6.7  ALBUMIN 3.0*   No results for input(s): LIPASE, AMYLASE in the last 168 hours.  Recent Labs Lab 05/20/15 1038  AMMONIA 104*   CBC:  Recent Labs Lab 05/20/15 1037  WBC 5.4  NEUTROABS 3.7  HGB 15.4  HCT 44.9  MCV 106.4*  PLT 77*   Cardiac Enzymes: No results for input(s): CKTOTAL, CKMB, CKMBINDEX, TROPONINI in the last 168 hours.  BNP (last 3 results) No results for  input(s): PROBNP in the last 8760 hours. CBG:  Recent Labs Lab 05/20/15 1031  GLUCAP 79    Radiographic Studies: No results found.    Assessment/Plan:   Active Problems: 1.   Hepatic encephalopathy ([place]): I will hold the diuretics for now. Initiate IV fluids and start patient on lactulose 3 times a day. Reevaluate his state of encephalopathy in about 12 hours. The patient still has continuing encephalopathy consider Xifaxin added to his regimen. 2. Hepatocellular carcinoma: Continue Nexavar 400 mg twice a day. 3. Mild dehydration: Hold diuretics 4. Diabetes type 2: Patient currently takes Lantus for management of blood sugars. According to his brother he uses no pre-meal dosing of any medication. Due to his nothing by mouth state I will hold his Lantus for now and we will apply sliding scale insulin for elevated blood sugars. As the patient is able to resume the diet that should be reinstated. 5. Fluids/electrolytes/nutrition: Due to his encephalopathic state, the patient is made nothing by mouth at this time. As the encephalopathy resolves diet will be advanced to a carb modified diet. 6. Decreased Pedal Pulses: In light of DM II, will obtain ABI's to evaluate for Peripheral Arterial Disease.  DVT prophylaxis - Lovenox ordered.  Code Status / Family Communication / Disposition Plan:   Code Status: Full. Family Communication: Brother at bedside and updated Disposition Plan: Home when stable.  Attestation regarding necessity of inpatient status:   The appropriate admission status for this patient is INPATIENT. Inpatient status is judged to be reasonable and necessary in order to provide the required intensity of service to ensure the patient's safety. The patient's presenting symptoms, physical exam findings, and initial radiographic and laboratory data in the context of their chronic comorbidities is felt to place them at high risk for further clinical deterioration. Furthermore,  it is not anticipated that the patient will be medically stable for discharge from the hospital within 2 midnights of admission. The following factors support the admission status of inpatient.   -The patient's presenting symptoms include  encephalopathy. - The worrisome physical exam findings include elevated ammonia levels. - The chronic co-morbidities include diabetes type 2, hepatocellular carcinoma. - Patient requires inpatient status due to high intensity of service, high risk for further deterioration and high frequency of surveillance required. - I certify that at the point of admission it is my clinical judgment that the patient will require inpatient hospital care spanning beyond 2 midnights from the point of admission.   Time spent: One hour  MATTHEWS,MICHELLE A. Pager [phone] Cell: [phone]   If 7PM-7AM, please contact night-coverage [email or web address] Password [email or web address] 05/20/2015, 2:01 PM

## 2015-05-20 NOTE — ED Notes (Signed)
Bed: [id number] Expected date:  Expected time:  Means of arrival:  Comments: EMS- Liver CA, AMS

## 2015-05-21 ENCOUNTER — Encounter (HOSPITAL_COMMUNITY): Payer: PRIVATE HEALTH INSURANCE

## 2015-05-21 ENCOUNTER — Inpatient Hospital Stay (HOSPITAL_COMMUNITY): Payer: Medicare Other

## 2015-05-21 DIAGNOSIS — K769 Liver disease, unspecified: Secondary | ICD-10-CM

## 2015-05-21 DIAGNOSIS — E440 Moderate protein-calorie malnutrition: Secondary | ICD-10-CM

## 2015-05-21 DIAGNOSIS — K7460 Unspecified cirrhosis of liver: Secondary | ICD-10-CM

## 2015-05-21 DIAGNOSIS — E119 Type 2 diabetes mellitus without complications: Secondary | ICD-10-CM

## 2015-05-21 LAB — COMPREHENSIVE METABOLIC PANEL
ALT: 40 U/L (ref 17–63)
AST: 61 U/L — ABNORMAL HIGH (ref 15–41)
Albumin: 2.4 g/dL — ABNORMAL LOW (ref 3.5–5.0)
Alkaline Phosphatase: 59 U/L (ref 38–126)
Anion gap: 10 (ref 5–15)
BUN: 34 mg/dL — ABNORMAL HIGH (ref 6–20)
CHLORIDE: 106 mmol/L (ref 101–111)
CO2: 19 mmol/L — AB (ref 22–32)
CREATININE: 0.97 mg/dL (ref 0.61–1.24)
Calcium: 8.3 mg/dL — ABNORMAL LOW (ref 8.9–10.3)
GFR calc non Af Amer: >60 mL/min (ref >60)
Glucose, Bld: 101 mg/dL — ABNORMAL HIGH (ref 65–99)
Potassium: 4.7 mmol/L (ref 3.5–5.1)
SODIUM: 135 mmol/L (ref 135–145)
Total Bilirubin: 2.9 mg/dL — ABNORMAL HIGH (ref 0.3–1.2)
Total Protein: 5.5 g/dL — ABNORMAL LOW (ref 6.5–8.1)

## 2015-05-21 LAB — URINALYSIS, ROUTINE W REFLEX MICROSCOPIC
GLUCOSE, UA: NEGATIVE mg/dL
Hgb urine dipstick: NEGATIVE
Ketones, ur: NEGATIVE mg/dL
NITRITE: NEGATIVE
PH: 5 (ref 5.0–8.0)
Protein, ur: NEGATIVE mg/dL
Specific Gravity, Urine: 1.03 (ref 1.005–1.030)

## 2015-05-21 LAB — GLUCOSE, CAPILLARY
GLUCOSE-CAPILLARY: 150 mg/dL — AB (ref 65–99)
Glucose-Capillary: 145 mg/dL — ABNORMAL HIGH (ref 65–99)
Glucose-Capillary: 162 mg/dL — ABNORMAL HIGH (ref 65–99)

## 2015-05-21 LAB — URINE MICROSCOPIC-ADD ON

## 2015-05-21 LAB — AMMONIA: AMMONIA: 111 umol/L — AB (ref 9–35)

## 2015-05-21 MED ORDER — SODIUM CHLORIDE 0.9 % IV BOLUS (SEPSIS)
250.0000 mL | Freq: Once | INTRAVENOUS | Status: AC
  Administered 2015-05-21: 250 mL via INTRAVENOUS

## 2015-05-21 MED ORDER — RIFAXIMIN 550 MG PO TABS
550.0000 mg | ORAL_TABLET | Freq: Two times a day (BID) | ORAL | Status: DC
  Administered 2015-05-21 – 2015-05-22 (×2): 550 mg via ORAL
  Filled 2015-05-21 (×3): qty 1

## 2015-05-21 NOTE — Discharge Summary (Signed)
Discharge Summary  Ethan Wade [name] DOB: 02/22/1951  PCP: Gerrit Heck, MD  Admit date: 05/20/2015 Anticipated Discharge date: 05/22/2015  Time spent: 25 minutes   Recommendations for Outpatient Follow-up:  1. Medication clarification: Advised patient that he needs to take his lactulose 3 times a day, according to his brother and patient, he takes it twice a day or less   Discharge Diagnoses:  Active Hospital Problems   Diagnosis Date Noted  . Hepatic encephalopathy ([place]) 04/01/2014  . Hepatocellular carcinoma ([place])   . Diabetes type 2, controlled ([place])   . Chronic liver disease and cirrhosis ([place])   . Malnutrition of moderate degree ([place]) 11/13/2013  . Thrombocytopenia [facility])     Resolved Hospital Problems   Diagnosis Date Noted Date Resolved  No resolved problems to display.    Discharge Condition: Improved, being discharged home in the care of his brother   Diet recommendation: Low-sodium   Filed Weights   05/20/15 1913  Weight: 64.048 kg (141 lb 3.2 oz)    History of present illness:  65 year old male with past medical history of liver cirrhosis, hepatocellular carcinoma and secondary hepatic encephalopathy who for the most part takes his medications, but according to his brother sometimes does not take enough. Patient admitted on 2/20 with confusion and at that time, ammonia level found be elevated at 111.   Hospital Course:   Active Problems:    Malnutrition of moderate degree ([place]): Now that he is by mouth, resume supplementation    Chronic liver disease and cirrhosis ([place]) with hepatocellular carcinoma and secondary hepatic encephalopathy and thrombocytopenia: Much improved after restarting lactulose.  Restarting patient's by mouth medications and ambulating and he will be discharged on morning of 2/22    Diabetes type 2, controlled ([place]): CBG stable  Peripheral vascular disease: Difficult palpating pulses, so ABIs ordered. Triphasic  waveform noted, but no significant signs of stenoses  Procedures:  ABIs done 2/21: Right at 1.19, left at 1.09   Consultations:  None   Discharge Exam: BP 92/58 mmHg  Pulse 72  Temp(Src) 97.9 F (36.6 C) (Oral)  Resp 16  Ht 5\' 11"  (1.803 m)  Wt 64.048 kg (141 lb 3.2 oz)  BMI 19.70 kg/m2  SpO2 100%  General: Alert and oriented 3  Cardiovascular: Regular rate and rhythm, Q000111Q, 2/6 systolic ejection murmur  Respiratory: Clear to auscultation bilaterally   Discharge Instructions You were cared for by a hospitalist during your hospital stay. If you have any questions about your discharge medications or the care you received while you were in the hospital after you are discharged, you can call the unit and asked to speak with the hospitalist on call if the hospitalist that took care of you is not available. Once you are discharged, your primary care physician will handle any further medical issues. Please note that NO REFILLS for any discharge medications will be authorized once you are discharged, as it is imperative that you return to your primary care physician (or establish a relationship with a primary care physician if you do not have one) for your aftercare needs so that they can reassess your need for medications and monitor your lab values.     Medication List    TAKE these medications        CLEAR EYES FOR DRY EYES 1-0.25 % Soln  Generic drug:  Carboxymethylcellul-Glycerin  Apply 1-2 drops to eye daily as needed (for dry eyes). Reported on 05/20/2015     dicyclomine 10 MG capsule  Commonly known as:  BENTYL  Take 1 capsule (10 mg total) by mouth 2 (two) times daily.     furosemide 40 MG tablet  Commonly known as:  LASIX  Take 40 mg by mouth daily.     insulin glargine 100 UNIT/ML injection  Commonly known as:  LANTUS  Inject 0.08 mLs (8 Units total) into the skin daily.     lactulose 10 GM/15ML solution  Commonly known as:  CHRONULAC  Take 45 mLs (30 g total)  by mouth 3 (three) times daily.     nadolol 20 MG tablet  Commonly known as:  CORGARD  Take 20 mg by mouth daily.     ondansetron 4 MG disintegrating tablet  Commonly known as:  ZOFRAN ODT  Take 1 tablet (4 mg total) by mouth every 8 (eight) hours as needed for nausea or vomiting.     oxyCODONE 5 MG immediate release tablet  Commonly known as:  Oxy IR/ROXICODONE  Take 1 tablet (5 mg total) by mouth every 12 (twelve) hours as needed for severe pain or breakthrough pain (only for severe or breakthrough pain).     prochlorperazine 10 MG tablet  Commonly known as:  COMPAZINE  Take 10 mg by mouth every 6 (six) hours as needed for nausea or vomiting.     SORAfenib 200 MG tablet  Commonly known as:  NEXAVAR  Take 400 mg by mouth every 12 (twelve) hours. Take on an empty stomach 1 hour before or 2 hours after meals.     spironolactone 100 MG tablet  Commonly known as:  ALDACTONE  Take 100 mg by mouth daily.     tamsulosin 0.4 MG Caps capsule  Commonly known as:  FLOMAX  Take 0.4 mg by mouth daily.     Vitamin D (Ergocalciferol) 50000 units Caps capsule  Commonly known as:  DRISDOL  Take 50,000 Units by mouth every Friday.     XIFAXAN 550 MG Tabs tablet  Generic drug:  rifaximin  Take 550 mg by mouth 2 (two) times daily.       No Known Allergies    The results of significant diagnostics from this hospitalization (including imaging, microbiology, ancillary and laboratory) are listed below for reference.    Significant Diagnostic Studies: Dg Chest Portable 1 View  05/08/2015  CLINICAL DATA:  Altered mental status, no chest pain or shortness of breath EXAM: PORTABLE CHEST 1 VIEW COMPARISON:  04/01/2014 FINDINGS: The heart size and mediastinal contours are within normal limits. Both lungs are clear. The visualized skeletal structures are unremarkable. IMPRESSION: No active disease. Electronically Signed   By: Kathreen Devoid   On: 05/08/2015 17:56    Microbiology: No results found  for this or any previous visit (from the past 240 hour(s)).   Labs: Basic Metabolic Panel:  Recent Labs Lab 05/20/15 1037 05/21/15 0441  NA 135 135  K 5.1 4.7  CL 99* 106  CO2 23 19*  GLUCOSE 91 101*  BUN 33* 34*  CREATININE 1.11 0.97  CALCIUM 8.8* 8.3*   Liver Function Tests:  Recent Labs Lab 05/20/15 1037 05/21/15 0441  AST 70* 61*  ALT 46 40  ALKPHOS 78 59  BILITOT 3.5* 2.9*  PROT 6.7 5.5*  ALBUMIN 3.0* 2.4*   No results for input(s): LIPASE, AMYLASE in the last 168 hours.  Recent Labs Lab 05/20/15 1038 05/21/15 0441  AMMONIA 104* 111*   CBC:  Recent Labs Lab 05/20/15 1037  WBC 5.4  NEUTROABS 3.7  HGB  15.4  HCT 44.9  MCV 106.4*  PLT 77*   Cardiac Enzymes: No results for input(s): CKTOTAL, CKMB, CKMBINDEX, TROPONINI in the last 168 hours. BNP: BNP (last 3 results) No results for input(s): BNP in the last 8760 hours.  ProBNP (last 3 results) No results for input(s): PROBNP in the last 8760 hours.  CBG:  Recent Labs Lab 05/20/15 1031 05/20/15 1708 05/20/15 2159 05/21/15 1225  GLUCAP 79 103* 99 150*       Signed:  Bubba Vanbenschoten K  [facility] 05/21/2015, 4:28 PM

## 2015-05-21 NOTE — Progress Notes (Signed)
VASCULAR LAB PRELIMINARY  ARTERIAL  ABI completed:    RIGHT    LEFT    PRESSURE WAVEFORM  PRESSURE WAVEFORM  BRACHIAL 103 Triphasic  BRACHIAL 96 Triphasic   DP 114 Triphasic  DP 112 Triphasic   AT   AT    PT 123 Triphasic  PT 108 Triphasic   PER   PER    GREAT TOE  NA GREAT TOE  NA    RIGHT LEFT  ABI 1.19 1.09     Tiara Bartoli, RVT 05/21/2015, 3:22 PM

## 2015-05-22 DIAGNOSIS — G9340 Encephalopathy, unspecified: Secondary | ICD-10-CM

## 2015-05-22 DIAGNOSIS — R4182 Altered mental status, unspecified: Secondary | ICD-10-CM | POA: Insufficient documentation

## 2015-05-22 LAB — GLUCOSE, CAPILLARY: Glucose-Capillary: 132 mg/dL — ABNORMAL HIGH (ref 65–99)

## 2015-05-22 NOTE — Discharge Summary (Signed)
Discharge Summary  Ethan Wade [name] DOB: 12/04/1950  PCP: Gerrit Heck, MD  Admit date: 05/20/2015 Anticipated Discharge date: 05/22/2015  Time spent: 25 minutes   Recommendations for Outpatient Follow-up:  1. Medication clarification: Advised patient that he needs to take his lactulose 3 times a day, according to his brother and patient, he takes it twice a day or less due to fear of having diarrhea 2.   Discharge Diagnoses:  Active Hospital Problems   Diagnosis Date Noted  . 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 Altered mental status   . Hepatocellular carcinoma ([place])   . Diabetes type 2, controlled ([place])   . Chronic liver disease and cirrhosis ([place])   . Malnutrition of moderate degree ([place]) 11/13/2013  . Thrombocytopenia [facility])     Resolved Hospital Problems   Diagnosis Date Noted Date Resolved  No resolved problems to display.    Discharge Condition: Improved, being discharged home in the care of his brother   Diet recommendation: Low-sodium   Filed Weights   05/20/15 1913  Weight: 64.048 kg (141 lb 3.2 oz)    History of present illness:  65 year old male with past medical history of liver cirrhosis, hepatocellular carcinoma and secondary hepatic encephalopathy who for the most part takes his medications, but according to his brother sometimes does not take enough. Patient admitted on 2/20 with confusion and at that time, ammonia level found be elevated at 111.   Hospital Course:   Active Problems:    Malnutrition of moderate degree ([place]): Now that he is by mouth, resume supplementation    Chronic liver disease and cirrhosis ([place]) with hepatocellular carcinoma and secondary hepatic encephalopathy and thrombocytopenia: Much improved after restarting lactulose.  Restarting patient's by mouth medications and ambulating and he will be discharged on morning of 2/22    Diabetes type 2, controlled ([place]): CBG stable  Peripheral vascular  disease: Difficult palpating pulses, so ABIs ordered. Triphasic waveform noted, but no significant signs of stenoses  RIGHT LEFT  ABI 1.19 1.09         Procedures:  ABIs done 2/21: Right at 1.19, left at 1.09   Consultations:  None   Discharge Exam: BP 102/64 mmHg  Pulse 70  Temp(Src) 98.1 F (36.7 C) (Oral)  Resp 15  Ht 5\' 11"  (1.803 m)  Wt 64.048 kg (141 lb 3.2 oz)  BMI 19.70 kg/m2  SpO2 100%  General: Alert and oriented 3  Cardiovascular: Regular rate and rhythm, Q000111Q, 2/6 systolic ejection murmur  Respiratory: Clear to auscultation bilaterally   Discharge Instructions You were cared for by a hospitalist during your hospital stay. If you have any questions about your discharge medications or the care you received while you were in the hospital after you are discharged, you can call the unit and asked to speak with the hospitalist on call if the hospitalist that took care of you is not available. Once you are discharged, your primary care physician will handle any further medical issues. Please note that NO REFILLS for any discharge medications will be authorized once you are discharged, as it is imperative that you return to your primary care physician (or establish a relationship with a primary care physician if you do not have one) for your aftercare needs so that they can reassess your need for medications and monitor your lab values.     Medication List    TAKE these medications        CLEAR EYES FOR DRY EYES 1-0.25 % Soln  Generic drug:  Carboxymethylcellul-Glycerin  Apply 1-2 drops to eye daily as needed (for dry eyes). Reported on 05/20/2015     dicyclomine 10 MG capsule  Commonly known as:  BENTYL  Take 1 capsule (10 mg total) by mouth 2 (two) times daily.     furosemide 40 MG tablet  Commonly known as:  LASIX  Take 40 mg by mouth daily.     insulin glargine 100 UNIT/ML injection  Commonly known as:  LANTUS  Inject 0.08 mLs (8 Units total) into  the skin daily.     lactulose 10 GM/15ML solution  Commonly known as:  CHRONULAC  Take 45 mLs (30 g total) by mouth 3 (three) times daily.     nadolol 20 MG tablet  Commonly known as:  CORGARD  Take 20 mg by mouth daily.     ondansetron 4 MG disintegrating tablet  Commonly known as:  ZOFRAN ODT  Take 1 tablet (4 mg total) by mouth every 8 (eight) hours as needed for nausea or vomiting.     oxyCODONE 5 MG immediate release tablet  Commonly known as:  Oxy IR/ROXICODONE  Take 1 tablet (5 mg total) by mouth every 12 (twelve) hours as needed for severe pain or breakthrough pain (only for severe or breakthrough pain).     prochlorperazine 10 MG tablet  Commonly known as:  COMPAZINE  Take 10 mg by mouth every 6 (six) hours as needed for nausea or vomiting.     SORAfenib 200 MG tablet  Commonly known as:  NEXAVAR  Take 400 mg by mouth every 12 (twelve) hours. Take on an empty stomach 1 hour before or 2 hours after meals.     spironolactone 100 MG tablet  Commonly known as:  ALDACTONE  Take 100 mg by mouth daily.     tamsulosin 0.4 MG Caps capsule  Commonly known as:  FLOMAX  Take 0.4 mg by mouth daily.     Vitamin D (Ergocalciferol) 50000 units Caps capsule  Commonly known as:  DRISDOL  Take 50,000 Units by mouth every Friday.     XIFAXAN 550 MG Tabs tablet  Generic drug:  rifaximin  Take 550 mg by mouth 2 (two) times daily.       No Known Allergies    The results of significant diagnostics from this hospitalization (including imaging, microbiology, ancillary and laboratory) are listed below for reference.    Significant Diagnostic Studies: Dg Chest Portable 1 View  05/08/2015  CLINICAL DATA:  Altered mental status, no chest pain or shortness of breath EXAM: PORTABLE CHEST 1 VIEW COMPARISON:  04/01/2014 FINDINGS: The heart size and mediastinal contours are within normal limits. Both lungs are clear. The visualized skeletal structures are unremarkable. IMPRESSION: No active  disease. Electronically Signed   By: Kathreen Devoid   On: 05/08/2015 17:56    Microbiology: No results found for this or any previous visit (from the past 240 hour(s)).   Labs: Basic Metabolic Panel:  Recent Labs Lab 05/20/15 1037 05/21/15 0441  NA 135 135  K 5.1 4.7  CL 99* 106  CO2 23 19*  GLUCOSE 91 101*  BUN 33* 34*  CREATININE 1.11 0.97  CALCIUM 8.8* 8.3*   Liver Function Tests:  Recent Labs Lab 05/20/15 1037 05/21/15 0441  AST 70* 61*  ALT 46 40  ALKPHOS 78 59  BILITOT 3.5* 2.9*  PROT 6.7 5.5*  ALBUMIN 3.0* 2.4*   No results for input(s): LIPASE, AMYLASE in the last 168 hours.  Recent Labs Lab 05/20/15 1038 05/21/15 0441  AMMONIA 104* 111*   CBC:  Recent Labs Lab 05/20/15 1037  WBC 5.4  NEUTROABS 3.7  HGB 15.4  HCT 44.9  MCV 106.4*  PLT 77*   Cardiac Enzymes: No results for input(s): CKTOTAL, CKMB, CKMBINDEX, TROPONINI in the last 168 hours. BNP: BNP (last 3 results) No results for input(s): BNP in the last 8760 hours.  ProBNP (last 3 results) No results for input(s): PROBNP in the last 8760 hours.  CBG:  Recent Labs Lab 05/20/15 2159 05/21/15 1225 05/21/15 1718 05/21/15 2226 05/22/15 0751  GLUCAP 99 150* 145* 162* 132*       Signed:  Kadance Mccuistion  [facility] 05/22/2015, 10:03 AM

## 2015-05-22 NOTE — Evaluation (Signed)
Occupational Therapy Evaluation Patient Details Name: Ethan Wade MRN: [id number] DOB: 02-10-1951 Today's Date: 05/22/2015    History of Present Illness [name] is an 65 y.o. male with a history of cirrhosis, hepatitis C (currently being treated), hepatocellular carcinoma (currently being treated at [facility]). According to the patient's brother he was in his usual state of health up until 2 days ago. The brother noted that yesterday he started becoming somewhat confused this got progressively worse, however the patient was refusing to be seen at the hospital. His brother went to see him this morning and he wasn't recognized to Brother thus he called EMS and had him brought to the emergency room. The patient is unable to contribute to the history due to his state of confusion. However according to the brother he had no complaints in the last several days. The patient brother reports that he has not had any alcohol in about 2 years. In the emergency room laboratory studies revealed an ammonia level of 104. His BUN and creatinine were marginally elevated otherwise laboratory studies are at baseline.   Clinical Impression   Pt at baseline with ADL activity    Follow Up Recommendations  No OT follow up    Equipment Recommendations  None recommended by OT    Recommendations for Other Services       Precautions / Restrictions Precautions Precautions: None      Mobility Bed Mobility Overal bed mobility: Independent                Transfers Overall transfer level: Independent                         ADL Overall ADL's : At baseline                                                       Pertinent Vitals/Pain Pain Assessment: No/denies pain     Hand Dominance     Extremity/Trunk Assessment Upper Extremity Assessment Upper Extremity Assessment: Generalized weakness           Communication  Communication Communication: No difficulties   Cognition Arousal/Alertness: Awake/alert Behavior During Therapy: WFL for tasks assessed/performed Overall Cognitive Status: Within Functional Limits for tasks assessed                                Home Living Family/patient expects to be discharged to:: Private residence Living Arrangements: Other relatives Available Help at Discharge: Family Type of Home: House Home Access: Stairs to enter Technical brewer of Steps: 3   Home Layout: One level     Bathroom Shower/Tub: Teacher, early years/pre: Standard     Home Equipment: Cane - single point;Bedside commode          Prior Functioning/Environment      I                                    End of Session    Activity Tolerance: Patient tolerated treatment well Patient left:  with brother and nursing   Time: 1140-1150 OT Time Calculation (min): 10 min Charges:  OT General Charges $OT Visit: 1  Procedure OT Evaluation $OT Eval Low Complexity: 1 Procedure G-Codes:    Betsy Pries May 26, 2015, 11:54 AM

## 2015-05-23 LAB — URINE CULTURE

## 2015-06-08 ENCOUNTER — Encounter (HOSPITAL_COMMUNITY): Payer: Self-pay

## 2015-06-08 ENCOUNTER — Observation Stay (HOSPITAL_COMMUNITY)
Admission: EM | Admit: 2015-06-08 | Discharge: 2015-06-10 | Disposition: A | Discharge status: Home / Self Care | Payer: Medicare Other | Attending: Family Medicine | Admitting: Family Medicine

## 2015-06-08 DIAGNOSIS — Z800 Family history of malignant neoplasm of digestive organs: Secondary | ICD-10-CM | POA: Diagnosis not present

## 2015-06-08 DIAGNOSIS — K7460 Unspecified cirrhosis of liver: Secondary | ICD-10-CM | POA: Insufficient documentation

## 2015-06-08 DIAGNOSIS — Z87891 Personal history of nicotine dependence: Secondary | ICD-10-CM | POA: Diagnosis not present

## 2015-06-08 DIAGNOSIS — I81 Portal vein thrombosis: Secondary | ICD-10-CM | POA: Insufficient documentation

## 2015-06-08 DIAGNOSIS — E1151 Type 2 diabetes mellitus with diabetic peripheral angiopathy without gangrene: Secondary | ICD-10-CM | POA: Diagnosis present

## 2015-06-08 DIAGNOSIS — C220 Liver cell carcinoma: Secondary | ICD-10-CM | POA: Insufficient documentation

## 2015-06-08 DIAGNOSIS — I10 Essential (primary) hypertension: Secondary | ICD-10-CM | POA: Diagnosis not present

## 2015-06-08 DIAGNOSIS — R4182 Altered mental status, unspecified: Secondary | ICD-10-CM | POA: Diagnosis present

## 2015-06-08 DIAGNOSIS — D696 Thrombocytopenia, unspecified: Secondary | ICD-10-CM | POA: Diagnosis not present

## 2015-06-08 DIAGNOSIS — Z825 Family history of asthma and other chronic lower respiratory diseases: Secondary | ICD-10-CM | POA: Insufficient documentation

## 2015-06-08 DIAGNOSIS — Z8042 Family history of malignant neoplasm of prostate: Secondary | ICD-10-CM | POA: Diagnosis not present

## 2015-06-08 DIAGNOSIS — Z794 Long term (current) use of insulin: Secondary | ICD-10-CM | POA: Insufficient documentation

## 2015-06-08 DIAGNOSIS — N400 Enlarged prostate without lower urinary tract symptoms: Secondary | ICD-10-CM | POA: Diagnosis not present

## 2015-06-08 DIAGNOSIS — Z9114 Patient's other noncompliance with medication regimen: Secondary | ICD-10-CM | POA: Diagnosis not present

## 2015-06-08 DIAGNOSIS — Z79899 Other long term (current) drug therapy: Secondary | ICD-10-CM | POA: Diagnosis not present

## 2015-06-08 DIAGNOSIS — C229 Malignant neoplasm of liver, not specified as primary or secondary: Secondary | ICD-10-CM | POA: Diagnosis present

## 2015-06-08 DIAGNOSIS — G9340 Encephalopathy, unspecified: Secondary | ICD-10-CM | POA: Diagnosis not present

## 2015-06-08 DIAGNOSIS — B182 Chronic viral hepatitis C: Secondary | ICD-10-CM | POA: Diagnosis not present

## 2015-06-08 DIAGNOSIS — K7290 Hepatic failure, unspecified without coma: Secondary | ICD-10-CM | POA: Diagnosis present

## 2015-06-08 DIAGNOSIS — E860 Dehydration: Secondary | ICD-10-CM | POA: Insufficient documentation

## 2015-06-08 DIAGNOSIS — E440 Moderate protein-calorie malnutrition: Secondary | ICD-10-CM | POA: Diagnosis not present

## 2015-06-08 DIAGNOSIS — K7682 Hepatic encephalopathy: Secondary | ICD-10-CM | POA: Diagnosis present

## 2015-06-08 LAB — COMPREHENSIVE METABOLIC PANEL
ALBUMIN: 2.9 g/dL — AB (ref 3.5–5.0)
ALK PHOS: 84 U/L (ref 38–126)
ALT: 44 U/L (ref 17–63)
AST: 63 U/L — AB (ref 15–41)
Anion gap: 14 (ref 5–15)
BUN: 20 mg/dL (ref 6–20)
CHLORIDE: 101 mmol/L (ref 101–111)
CO2: 23 mmol/L (ref 22–32)
Calcium: 9.2 mg/dL (ref 8.9–10.3)
Creatinine, Ser: 1.09 mg/dL (ref 0.61–1.24)
GFR calc Af Amer: >60 mL/min (ref >60)
GFR calc non Af Amer: >60 mL/min (ref >60)
GLUCOSE: 145 mg/dL — AB (ref 65–99)
POTASSIUM: 4.6 mmol/L (ref 3.5–5.1)
SODIUM: 138 mmol/L (ref 135–145)
Total Bilirubin: 2.7 mg/dL — ABNORMAL HIGH (ref 0.3–1.2)
Total Protein: 6.9 g/dL (ref 6.5–8.1)

## 2015-06-08 LAB — CBC
HEMATOCRIT: 45.4 % (ref 39.0–52.0)
HEMOGLOBIN: 15.8 g/dL (ref 13.0–17.0)
MCH: 36.8 pg — AB (ref 26.0–34.0)
MCHC: 34.8 g/dL (ref 30.0–36.0)
MCV: 105.8 fL — AB (ref 78.0–100.0)
Platelets: 84 10*3/uL — ABNORMAL LOW (ref 150–400)
RBC: 4.29 MIL/uL (ref 4.22–5.81)
RDW: 14.8 % (ref 11.5–15.5)
WBC: 5.4 10*3/uL (ref 4.0–10.5)

## 2015-06-08 LAB — PROTIME-INR
INR: 1.38 (ref 0.00–1.49)
Prothrombin Time: 17.1 seconds — ABNORMAL HIGH (ref 11.6–15.2)

## 2015-06-08 LAB — GLUCOSE, CAPILLARY
GLUCOSE-CAPILLARY: 144 mg/dL — AB (ref 65–99)
Glucose-Capillary: 201 mg/dL — ABNORMAL HIGH (ref 65–99)

## 2015-06-08 LAB — AMMONIA: Ammonia: 95 umol/L — ABNORMAL HIGH (ref 9–35)

## 2015-06-08 MED ORDER — SORAFENIB TOSYLATE 200 MG PO TABS
200.0000 mg | ORAL_TABLET | Freq: Two times a day (BID) | ORAL | Status: DC
  Administered 2015-06-09 – 2015-06-10 (×4): 200 mg via ORAL
  Filled 2015-06-08 (×3): qty 1

## 2015-06-08 MED ORDER — SODIUM CHLORIDE 0.9 % IV SOLN
INTRAVENOUS | Status: DC
  Administered 2015-06-08: 15:00:00 via INTRAVENOUS

## 2015-06-08 MED ORDER — LACTULOSE 10 GM/15ML PO SOLN
30.0000 g | Freq: Once | ORAL | Status: AC
  Administered 2015-06-08: 30 g via ORAL
  Filled 2015-06-08: qty 45

## 2015-06-08 MED ORDER — LACTULOSE 10 GM/15ML PO SOLN: 30.0000 g | Freq: Three times a day (TID) | ORAL | Status: DC

## 2015-06-08 MED ORDER — INSULIN ASPART 100 UNIT/ML ~~LOC~~ SOLN
0.0000 [IU] | Freq: Three times a day (TID) | SUBCUTANEOUS | Status: DC
  Administered 2015-06-08: 1 [IU] via SUBCUTANEOUS
  Administered 2015-06-09: 3 [IU] via SUBCUTANEOUS
  Administered 2015-06-09 (×2): 1 [IU] via SUBCUTANEOUS
  Administered 2015-06-10: 3 [IU] via SUBCUTANEOUS

## 2015-06-08 MED ORDER — ENOXAPARIN SODIUM 40 MG/0.4ML ~~LOC~~ SOLN
40.0000 mg | SUBCUTANEOUS | Status: DC
  Administered 2015-06-08 – 2015-06-10 (×3): 40 mg via SUBCUTANEOUS
  Filled 2015-06-08 (×3): qty 0.4

## 2015-06-08 MED ORDER — SPIRONOLACTONE 100 MG PO TABS
100.0000 mg | ORAL_TABLET | Freq: Every day | ORAL | Status: DC
  Administered 2015-06-08 – 2015-06-10 (×3): 100 mg via ORAL
  Filled 2015-06-08 (×3): qty 1
  Filled 2015-06-08 (×2): qty 4
  Filled 2015-06-08 (×2): qty 1

## 2015-06-08 MED ORDER — RIFAXIMIN 550 MG PO TABS: 250.0000 mg | ORAL_TABLET | Freq: Two times a day (BID) | ORAL | Status: DC

## 2015-06-08 MED ORDER — RIFAXIMIN 550 MG PO TABS
550.0000 mg | ORAL_TABLET | Freq: Two times a day (BID) | ORAL | Status: DC
  Administered 2015-06-08 – 2015-06-10 (×4): 550 mg via ORAL
  Filled 2015-06-08 (×6): qty 1

## 2015-06-08 MED ORDER — LACTULOSE 10 GM/15ML PO SOLN
30.0000 g | Freq: Four times a day (QID) | ORAL | Status: DC
  Administered 2015-06-08 – 2015-06-10 (×6): 30 g via ORAL
  Filled 2015-06-08 (×8): qty 45

## 2015-06-08 MED ORDER — NADOLOL 20 MG PO TABS
20.0000 mg | ORAL_TABLET | Freq: Every day | ORAL | Status: DC
  Administered 2015-06-09 – 2015-06-10 (×2): 20 mg via ORAL
  Filled 2015-06-08 (×5): qty 1

## 2015-06-08 MED ORDER — OXYCODONE HCL 5 MG PO TABS: 5.0000 mg | ORAL_TABLET | Freq: Two times a day (BID) | ORAL | Status: DC | PRN

## 2015-06-08 MED ORDER — SORAFENIB TOSYLATE 200 MG PO TABS
400.0000 mg | ORAL_TABLET | Freq: Two times a day (BID) | ORAL | Status: DC
Start: 2015-06-08 — End: 2015-06-08
  Filled 2015-06-08: qty 2

## 2015-06-08 MED ORDER — TAMSULOSIN HCL 0.4 MG PO CAPS
0.4000 mg | ORAL_CAPSULE | Freq: Every day | ORAL | Status: DC
  Administered 2015-06-08 – 2015-06-10 (×3): 0.4 mg via ORAL
  Filled 2015-06-08 (×3): qty 1

## 2015-06-08 NOTE — H&P (Signed)
[facility] History and Physical  Ethan Wade [name] DOB: 01/09/1951 DOA: 06/08/2015  Referring physician:  PCP: Gerrit Heck, MD   Chief Complaint: [place] encephalopathy.   HPI: Ethan Wade is a 65 y.o. male with a history of liver cirrhosis, hepatocellular carcinoma  on Nexavar, and secondary hepatic encephalopathy, Hep C currently being treated at [facility], DM2,insulin dependent, and a chronic history of esophageal bleed, recently discharged on 05/22/2015 due to confusion and elevated ammonia levels, presenting today with altered mental status. He had 3 admissions over the last 4 months with the same complaints. Unable to obtain further history due to confusion.  At the ED, He received Lactulose x1, IVF. CMET remarkable for TBil only 2.7 and AST 63, stable when compared to prior labs. CBC is from above for low platelet counts at 84,000, which is basically stable from prior admission as well. He is without bleeding issues. Of note, last CT head on 03/2015 was negative.  Review of Systems:  Unable to obtain due to acute encepahalopathy  Past Medical History  Diagnosis Date  . Macrocytosis   . Thrombocytopenia ([place])   . Esophageal bleed, non-variceal 2006  . HTN (hypertension)   . Psoriasis   . Mitral valve prolapse     OCCAS FLUTTER FEELING  . Vitamin D deficiency   . Umbilical hernia     OCCAS DISCOMFORT  . Prostate enlargement     PT TOLD "NORMAL FOR AGE" - TAKES FINASTERIDE AND FLOMAX  . Heart murmur   . Type II diabetes mellitus ([place])   . Rheumatic fever 1950's  . Hepatitis C 1980's  . Arthritis     "joint stiffness in the knees" (11/10/2013)  . Liver cancer ([place])   . Ascites   . Hepatic encephalopathy ([place]) 05/2015   Past Surgical History  Procedure Laterality Date  . Tonsilectomy, adenoidectomy, bilateral myringotomy and tubes  child  . Umbilical hernia repair  03/09/2012    Procedure: HERNIA REPAIR UMBILICAL ADULT;  Surgeon: Edward Jolly, MD;  Location: [facility];  Service: General;  Laterality: N/A;  Repair Umbilical Hernia with Mesh  . Insertion of mesh  03/09/2012    Procedure: INSERTION OF MESH;  Surgeon: Edward Jolly, MD;  Location: [facility];  Service: General;  Laterality: N/A;  . Inguinal hernia repair Left 1970's  . Hernia repair    . Tonsillectomy     Social History:  reports that he has quit smoking. His smoking use included Cigars. He started smoking about 9 years ago. He has quit using smokeless tobacco. His smokeless tobacco use included Snuff. He reports that he drinks alcohol. He reports that he uses illicit drugs.  No Known Allergies  Family History  Problem Relation Age of Onset  . Prostate cancer Brother   . COPD Mother   . Heart disease Father   . Diabetes Father   . Alcohol abuse Father   . Liver cancer Brother   . Colon cancer Brother   . COPD Maternal Grandfather      Prior to Admission medications   Medication Sig Start Date End Date Taking? Authorizing Provider  Carboxymethylcellul-Glycerin (CLEAR EYES FOR DRY EYES) 1-0.25 % SOLN Apply 1-2 drops to eye daily as needed (for dry eyes). Reported on 05/20/2015   Yes Historical Provider, MD  dicyclomine (BENTYL) 10 MG capsule Take 1 capsule (10 mg total) by mouth 2 (two) times daily. 11/13/13  Yes Ripudeep Krystal Eaton, MD  furosemide (LASIX) 40 MG tablet Take 40 mg by  mouth daily.   Yes Historical Provider, MD  insulin glargine (LANTUS) 100 UNIT/ML injection Inject 0.08 mLs (8 Units total) into the skin daily. 04/04/14  Yes Shanker Kristeen Mans, MD  lactulose (CHRONULAC) 10 GM/15ML solution Take 45 mLs (30 g total) by mouth 3 (three) times daily. 02/25/15  Yes Reyne Dumas, MD  nadolol (CORGARD) 20 MG tablet Take 20 mg by mouth daily.   Yes Historical Provider, MD  oxyCODONE (OXY IR/ROXICODONE) 5 MG immediate release tablet Take 1 tablet (5 mg total) by mouth every 12 (twelve) hours as needed for severe pain or breakthrough pain (only for severe or  breakthrough pain). 11/13/13  Yes Ripudeep Krystal Eaton, MD  SORAfenib (NEXAVAR) 200 MG tablet Take 400 mg by mouth every 12 (twelve) hours. Take on an empty stomach 1 hour before or 2 hours after meals.   Yes Historical Provider, MD  XIFAXAN 550 MG TABS tablet Take 550 mg by mouth 2 (two) times daily. 05/17/15  Yes Historical Provider, MD  zolpidem (AMBIEN) 5 MG tablet Take 5 mg by mouth at bedtime. 05/30/15  Yes Historical Provider, MD  ondansetron (ZOFRAN ODT) 4 MG disintegrating tablet Take 1 tablet (4 mg total) by mouth every 8 (eight) hours as needed for nausea or vomiting. Patient not taking: Reported on 06/08/2015 11/13/13   Ripudeep Krystal Eaton, MD  spironolactone (ALDACTONE) 100 MG tablet Take 100 mg by mouth daily.    Historical Provider, MD  Tamsulosin HCl (FLOMAX) 0.4 MG CAPS Take 0.4 mg by mouth daily.    Historical Provider, MD  Vitamin D, Ergocalciferol, (DRISDOL) 50000 UNITS CAPS Take 50,000 Units by mouth every Friday. Reported on 06/08/2015    Historical Provider, MD   Physical Exam: Filed Vitals:   06/08/15 1245 06/08/15 1315 06/08/15 1330 06/08/15 1345  BP: 112/86 119/86 118/89 113/92  Pulse: 69 68 75 76  Temp:      TempSrc:      Resp: 17 20 16 17   Height:      Weight:      SpO2: 96% 95% 97% 94%    Wt Readings from Last 3 Encounters:  06/08/15 66.225 kg (146 lb)  05/20/15 64.048 kg (141 lb 3.2 oz)  05/09/15 63.504 kg (140 lb)    General: Appears calm and comfortable, confuse, unable to engage in conversation Eyes:  PERRL, EOMI, normal lids, iris. Mildly icteric sclera  ENT: grossly normal hearing, lips & tongue Neck: no lymphadenopathy, masses or thyromegaly Cardiovascular: regular rate and rythm, no murmurs, rubs or gallops. No lower extremity edema   Respiratory: clear to auscultation bilaterally, no wheezing, rhonhci or rales. Normal respiratory effort. AbdomenPsoriatic rash in lower extremties without petechiae.  on limited exam. No open lesions. Musculoskeletal:  grossly  normal tone in both upper and lower extremities Psychiatric: grossly normal mood and affect, speech fluent and appropriate Neurologic: CN 2-12 grossly intact, moves all extremities in coordinated fashion. Cannot fololw commands at this time due to confusion          Labs on Admission:  Basic Metabolic Panel:  Recent Labs Lab 06/08/15 1053  NA 138  K 4.6  CL 101  CO2 23  GLUCOSE 145*  BUN 20  CREATININE 1.09  CALCIUM 9.2    Liver Function Tests:  Recent Labs Lab 06/08/15 1053  AST 63*  ALT 44  ALKPHOS 84  BILITOT 2.7*  PROT 6.9  ALBUMIN 2.9*   No results for input(s): LIPASE, AMYLASE in the last 168 hours.  Recent Labs Lab  06/08/15 1028  AMMONIA 95*    CBC:  Recent Labs Lab 06/08/15 1053  WBC 5.4  HGB 15.8  HCT 45.4  MCV 105.8*  PLT 84*     Radiological Exams on Admission: No results found.     Assessment/Plan Active Problems:   Thrombocytopenia ([place])   Chronic hepatitis C with cirrhosis (HCC)   HTN (hypertension)   Malnutrition of moderate degree (HCC)   Hepatic encephalopathy (HCC)   Liver cancer ([place])   Acute encephalopathy   Controlled type 2 DM with peripheral circulatory disorder (HCC)   Altered mental status   Acute Confusional State, level 5 Caveat. in the setting of abnormal ammonia levels and dehydration. He is afebrile, and good O2 sats in room air. Ammonia is 95, blood sugar 145, Bili 2.7  CT head is negative for acute intracranial abnormalities. No further seizures noted Hold diuresis for now, initiate IV fluids, continue lactulose 3 times a day. Revaluate encephalopathy  in 12 hours. - O2 PRN - BCX - BMET and CBG monitoring  Start Lactulose 30 g qid  Continue Xifaxan 550 mg bid   Due to the encephalopathic state, he will be nothing by mouth for now, once the encephalopathy resolved, we will advance to Carb modified diet  Type II Diabetes Home regimen includes Last A1C on was Current blood sugar level is 145  Lab Results    Component Value Date   HGBA1C 6.1* 02/23/2015  Hold home oral diabetic medications.  CBGs  SSI   Hepatocellular carcinoma: Continue  Nexavar 400 mg twice a day  Thrombocytopenia Likely due to malignancy, dilution, infection. No acute bleeding issues noted. Current platelet ct at 84k No transfusion is indicated at this time Monitor counts closely Transfuse 1 unit of platelets if count is less or equal than 10,000 or 20,000 if the patient is acutely bleeding Hold Lovenox if  platelets drop to less than 50,000   Abnormal LFTs, hepatocellular He has known liver lesions.  Bilirubin 2.7 No interventions at this time. Continue to hydrate and supportive therapy Repeat CMET in am.   Hypertension BP 113/92 mmHg  Pulse 76  Temp(Src) 97.7 F (36.5 C) (Oral)  Resp 17  Ht 5\' 11"  (1.803 m)  Wt 66.225 kg (146 lb)  BMI 20.37 kg/m2  SpO2 94% Stable. Continue home anti-hypertensive medications.  Add Hydralazine Q6 hours as needed for SBP >160 and /or DBP >110.     Code Status: Full Code  DVT Prophylaxis: Lovenox Family Communication: at bedside Disposition Plan: Pending Improvement. Admitted for observation in tele bed. Expected LOS 24-48 hrs    [facility] E,PA-C [facility] [email or web address] Password [email or web address]

## 2015-06-08 NOTE — ED Notes (Signed)
Pt. Is very confused, family member reports that his ammonia levels are up due to his demeanor and confusion.  Pt. Has liver cancer and is being treated.  P[t. Is alert and is disoriented to place and time.  Skin is p/w/d .  Pt. Took lactulose last night and one dose this am.  No distress noted.  Pt. Has chronic pain at the present time  He  Has scrotum pain and swelling due to the cancer.

## 2015-06-08 NOTE — ED Notes (Signed)
Attempted report 

## 2015-06-08 NOTE — ED Provider Notes (Signed)
CSN: [id number]     Arrival date & time 06/08/15  0931 History   First MD Initiated Contact with Patient 06/08/15 1147     Chief Complaint  Patient presents with  . Altered Mental Status     (Consider location/radiation/quality/duration/timing/severity/associated sxs/prior Treatment) HPI   Patient is a 65 year old male with hepatocellular carcinoma, history of hepatic encephalopathy, hypertension, esophageal bleed, chronic, thrombocytopenia presenting today with altered mental status. Patient lives alone in a specific take his lactulose 3 times a day. Patient had 3 admissions in the last month for not taking his lactulose as prescribed. Since Brother by him here for increased confusion at home.   Patient's brother reports he is not safe to be living at home right now getting his increased confusion and inability to follow directions.  Brother unsure but thinks that he probably has not been taking his lactulose as prescribed. \ Level 5 caveat AMS  Past Medical History  Diagnosis Date  . Macrocytosis   . Thrombocytopenia ([place])   . Esophageal bleed, non-variceal 2006  . HTN (hypertension)   . Psoriasis   . Mitral valve prolapse     OCCAS FLUTTER FEELING  . Vitamin D deficiency   . Umbilical hernia     OCCAS DISCOMFORT  . Prostate enlargement     PT TOLD "NORMAL FOR AGE" - TAKES FINASTERIDE AND FLOMAX  . Heart murmur   . Type II diabetes mellitus ([place])   . Rheumatic fever 1950's  . Hepatitis C 1980's  . Arthritis     "joint stiffness in the knees" (11/10/2013)  . Liver cancer ([place])   . Ascites   . Hepatic encephalopathy ([place]) 05/2015   Past Surgical History  Procedure Laterality Date  . Tonsilectomy, adenoidectomy, bilateral myringotomy and tubes  child  . Umbilical hernia repair  03/09/2012    Procedure: HERNIA REPAIR UMBILICAL ADULT;  Surgeon: Edward Jolly, MD;  Location: [facility];  Service: General;  Laterality: N/A;  Repair Umbilical Hernia with Mesh  . Insertion  of mesh  03/09/2012    Procedure: INSERTION OF MESH;  Surgeon: Edward Jolly, MD;  Location: [facility];  Service: General;  Laterality: N/A;  . Inguinal hernia repair Left 1970's  . Hernia repair    . Tonsillectomy     Family History  Problem Relation Age of Onset  . Prostate cancer Brother   . COPD Mother   . Heart disease Father   . Diabetes Father   . Alcohol abuse Father   . Liver cancer Brother   . Colon cancer Brother   . COPD Maternal Grandfather    Social History  Substance Use Topics  . Smoking status: Former Smoker -- 20 years    Types: Cigars    Start date: 12/22/2005  . Smokeless tobacco: Former Systems developer    Types: Snuff     Comment: "quit smoking ~ 2009; quit snuff in ~ 2013"  . Alcohol Use: Yes     Comment: "quit in late April 2015"    Review of Systems  Unable to perform ROS: Mental status change      Allergies  Review of patient's allergies indicates no known allergies.  Home Medications   Prior to Admission medications   Medication Sig Start Date End Date Taking? Authorizing Provider  Carboxymethylcellul-Glycerin (CLEAR EYES FOR DRY EYES) 1-0.25 % SOLN Apply 1-2 drops to eye daily as needed (for dry eyes). Reported on 05/20/2015   Yes Historical Provider, MD  dicyclomine (BENTYL) 10 MG capsule  Take 1 capsule (10 mg total) by mouth 2 (two) times daily. 11/13/13  Yes Ripudeep Krystal Eaton, MD  furosemide (LASIX) 40 MG tablet Take 40 mg by mouth daily.   Yes Historical Provider, MD  insulin glargine (LANTUS) 100 UNIT/ML injection Inject 0.08 mLs (8 Units total) into the skin daily. 04/04/14  Yes Shanker Kristeen Mans, MD  lactulose (CHRONULAC) 10 GM/15ML solution Take 45 mLs (30 g total) by mouth 3 (three) times daily. 02/25/15  Yes Reyne Dumas, MD  nadolol (CORGARD) 20 MG tablet Take 20 mg by mouth daily.   Yes Historical Provider, MD  oxyCODONE (OXY IR/ROXICODONE) 5 MG immediate release tablet Take 1 tablet (5 mg total) by mouth every 12 (twelve) hours as needed for  severe pain or breakthrough pain (only for severe or breakthrough pain). 11/13/13  Yes Ripudeep Krystal Eaton, MD  SORAfenib (NEXAVAR) 200 MG tablet Take 400 mg by mouth every 12 (twelve) hours. Take on an empty stomach 1 hour before or 2 hours after meals.   Yes Historical Provider, MD  XIFAXAN 550 MG TABS tablet Take 550 mg by mouth 2 (two) times daily. 05/17/15  Yes Historical Provider, MD  zolpidem (AMBIEN) 5 MG tablet Take 5 mg by mouth at bedtime. 05/30/15  Yes Historical Provider, MD  ondansetron (ZOFRAN ODT) 4 MG disintegrating tablet Take 1 tablet (4 mg total) by mouth every 8 (eight) hours as needed for nausea or vomiting. Patient not taking: Reported on 06/08/2015 11/13/13   Ripudeep Krystal Eaton, MD  spironolactone (ALDACTONE) 100 MG tablet Take 100 mg by mouth daily.    Historical Provider, MD  Tamsulosin HCl (FLOMAX) 0.4 MG CAPS Take 0.4 mg by mouth daily.    Historical Provider, MD  Vitamin D, Ergocalciferol, (DRISDOL) 50000 UNITS CAPS Take 50,000 Units by mouth every Friday. Reported on 06/08/2015    Historical Provider, MD   BP 109/86 mmHg  Pulse 62  Temp(Src) 97.7 F (36.5 C) (Oral)  Resp 15  Ht 5\' 11"  (1.803 m)  Wt 146 lb (66.225 kg)  BMI 20.37 kg/m2  SpO2 97% Physical Exam  Constitutional: He appears well-nourished.  Patient lying still with eyes closed.  HENT:  Head: Normocephalic.  Mild icterus  Eyes: Conjunctivae are normal.  Cardiovascular: Normal rate.   Pulmonary/Chest: Effort normal.  Neurological: No cranial nerve deficit.  Patient oriented to self. Confused.  Skin: Skin is warm and dry. He is not diaphoretic.    ED Course  Procedures (including critical care time) Labs Review Labs Reviewed  COMPREHENSIVE METABOLIC PANEL - Abnormal; Notable for the following:    Glucose, Bld 145 (*)    Albumin 2.9 (*)    AST 63 (*)    Total Bilirubin 2.7 (*)    All other components within normal limits  CBC - Abnormal; Notable for the following:    MCV 105.8 (*)    MCH 36.8 (*)     Platelets 84 (*)    All other components within normal limits  PROTIME-INR - Abnormal; Notable for the following:    Prothrombin Time 17.1 (*)    All other components within normal limits  AMMONIA - Abnormal; Notable for the following:    Ammonia 95 (*)    All other components within normal limits  CBG MONITORING, ED    Imaging Review No results found. I have personally reviewed and evaluated these images and lab results as part of my medical decision-making.   EKG Interpretation None      MDM  Final diagnoses:  None    Patient is a 65 year old male with history of hepatic encephalopathy secondary to hepatocellular carcinoma. Patient had 3 admissions in the last month for not taking his lactulose as prescribed. He is back with the same thing. Partially he is not safe to discharge home given his confusion. Patient was given lactose prior to arrival. [facility] give another dose here. However patient is not safe to discharge home. We will admit for  lactulose and likely be able to dispo home once cleared.  Do not see any other reason to work up AMS currently given strong history of hepatic encephalopathy and chronic non compliance.      Zola Runion Julio Alm, MD 06/08/15 1241

## 2015-06-09 DIAGNOSIS — K7290 Hepatic failure, unspecified without coma: Secondary | ICD-10-CM | POA: Diagnosis not present

## 2015-06-09 DIAGNOSIS — G9340 Encephalopathy, unspecified: Secondary | ICD-10-CM | POA: Diagnosis not present

## 2015-06-09 LAB — GLUCOSE, CAPILLARY
GLUCOSE-CAPILLARY: 193 mg/dL — AB (ref 65–99)
GLUCOSE-CAPILLARY: 164 mg/dL — AB (ref 65–99)
Glucose-Capillary: 141 mg/dL — ABNORMAL HIGH (ref 65–99)
Glucose-Capillary: 247 mg/dL — ABNORMAL HIGH (ref 65–99)

## 2015-06-09 LAB — COMPREHENSIVE METABOLIC PANEL
ALT: 37 U/L (ref 17–63)
AST: 51 U/L — AB (ref 15–41)
Albumin: 2.5 g/dL — ABNORMAL LOW (ref 3.5–5.0)
Alkaline Phosphatase: 71 U/L (ref 38–126)
Anion gap: 11 (ref 5–15)
BUN: 20 mg/dL (ref 6–20)
CHLORIDE: 105 mmol/L (ref 101–111)
CO2: 23 mmol/L (ref 22–32)
CREATININE: 0.93 mg/dL (ref 0.61–1.24)
Calcium: 8.5 mg/dL — ABNORMAL LOW (ref 8.9–10.3)
GFR calc non Af Amer: >60 mL/min (ref >60)
Glucose, Bld: 151 mg/dL — ABNORMAL HIGH (ref 65–99)
Potassium: 4 mmol/L (ref 3.5–5.1)
SODIUM: 139 mmol/L (ref 135–145)
Total Bilirubin: 2.9 mg/dL — ABNORMAL HIGH (ref 0.3–1.2)
Total Protein: 5.7 g/dL — ABNORMAL LOW (ref 6.5–8.1)

## 2015-06-09 NOTE — Progress Notes (Signed)
Ethan Wade [name] DOB: Dec 28, 1950 DOA: 06/08/2015 PCP: Gerrit Heck, MD  Brief narrative:  65 y/o ? H/o Hepatocellular Liver Ca diiag 10/2013 under care Dr. 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 [foll [facility]] and coagulopathy DM ty II BPH Chronic Portal vein thrombosis   Past medical history-As per Problem list Chart reviewed as below- Reviewed  Consultants:  None  Procedures:  None  Antibiotics:  None   Subjective  Awake alert, slightly better than what appears to be baseline on admission States compliance on medications No chest pain No nausea No vomiting currently Having breakfast at bedside No blurred vision or double vision no dysuria No abdominal swelling or discomfort   Objective    Interim History:   Telemetry:   Objective: Filed Vitals:   06/08/15 1400 06/08/15 1431 06/08/15 2103 06/09/15 0411  BP: 112/84 110/88 105/77 113/77  Pulse: 73 67 83 84  Temp:  98.7 F (37.1 C) 98.2 F (36.8 C) 98.1 F (36.7 C)  TempSrc:  Oral Oral Oral  Resp: 16 17 18 18   Height:      Weight:      SpO2: 96% 100% 100% 100%    Intake/Output Summary (Last 24 hours) at 06/09/15 0855 Last data filed at 06/09/15 0411  Gross per 24 hour  Intake    240 ml  Output      0 ml  Net    240 ml    Exam:  General: Alert pleasant oriented slightly emaciated Cardiovascular: S1-S2 no murmur rub or gallop Respiratory: Clinically clear no added sound Abdomen: Abdomen soft nontender no rebound, no hepatojugular reflux Skin slight lower extremity edema  Neuro intact  Data Reviewed: Basic Metabolic Panel:  Recent Labs Lab 06/08/15 1053 06/09/15 0522  NA 138 139  K 4.6 4.0  CL 101 105  CO2 23 23  GLUCOSE 145* 151*  BUN 20 20  CREATININE 1.09 0.93  CALCIUM 9.2 8.5*   Liver Function Tests:  Recent Labs Lab 06/08/15 1053 06/09/15 0522  AST 63* 51*  ALT 44 37  ALKPHOS 84 71    BILITOT 2.7* 2.9*  PROT 6.9 5.7*  ALBUMIN 2.9* 2.5*   No results for input(s): LIPASE, AMYLASE in the last 168 hours.  Recent Labs Lab 06/08/15 1028  AMMONIA 95*   CBC:  Recent Labs Lab 06/08/15 1053  WBC 5.4  HGB 15.8  HCT 45.4  MCV 105.8*  PLT 84*   Cardiac Enzymes: No results for input(s): CKTOTAL, CKMB, CKMBINDEX, TROPONINI in the last 168 hours. BNP: Invalid input(s): POCBNP CBG:  Recent Labs Lab 06/08/15 1738 06/08/15 2220 06/09/15 0617  GLUCAP 144* 201* 141*    No results found for this or any previous visit (from the past 240 hour(s)).   Studies:              All Imaging reviewed and is as per above notation   Scheduled Meds: . enoxaparin (LOVENOX) injection  40 mg Subcutaneous Q24H  . insulin aspart  0-9 Units Subcutaneous TID WC  . lactulose  30 g Oral QID  . nadolol  20 mg Oral Daily  . rifaximin  550 mg Oral BID  . SORAfenib  200 mg Oral BID  . spironolactone  100 mg Oral Daily  . tamsulosin  0.4 mg Oral Daily   Continuous Infusions: . sodium chloride 75 mL/hr at 06/08/15 1437     Assessment/Plan:  1. Hepatic encephalopathy likely secondary  to noncompliance-continue lactulose 30 mg 4 times a day-continue rifaximin 550 twice a day as well. Seems better compared to baseline but will need to continue medications-0-seems like not taking meds correctly? 2. History hepatitis C with cirrhosis status post-continue Aldactone 100 daily with no Lasix. Consider re-addition of Lasix on discharge-continue Corgard 20 daily 3. Liver cancer under care of [facility] sorafenib 200 twice a day-outpatient labs needed 4. BPH continue Flomax 0.4 daily    Called and discussed with brother.  Patient lives alone OBS overnight-? Possible can d/c in am Eval PT    Ethan Griffes, MD  [facility] Pager [phone] 06/09/2015, 8:55 AM

## 2015-06-10 DIAGNOSIS — K7290 Hepatic failure, unspecified without coma: Secondary | ICD-10-CM | POA: Diagnosis not present

## 2015-06-10 LAB — GLUCOSE, CAPILLARY
Glucose-Capillary: 110 mg/dL — ABNORMAL HIGH (ref 65–99)
Glucose-Capillary: 210 mg/dL — ABNORMAL HIGH (ref 65–99)

## 2015-06-10 LAB — CBC
HCT: 39.6 % (ref 39.0–52.0)
HEMOGLOBIN: 13.6 g/dL (ref 13.0–17.0)
MCH: 35.8 pg — AB (ref 26.0–34.0)
MCHC: 34.3 g/dL (ref 30.0–36.0)
MCV: 104.2 fL — AB (ref 78.0–100.0)
Platelets: 64 10*3/uL — ABNORMAL LOW (ref 150–400)
RBC: 3.8 MIL/uL — AB (ref 4.22–5.81)
RDW: 14.6 % (ref 11.5–15.5)
WBC: 4.4 10*3/uL (ref 4.0–10.5)

## 2015-06-10 LAB — HEMOGLOBIN A1C
Hgb A1c MFr Bld: 6.7 % — ABNORMAL HIGH (ref 4.8–5.6)
Mean Plasma Glucose: 146 mg/dL

## 2015-06-10 NOTE — Care Management Obs Status (Signed)
[facility] NOTIFICATION   Patient Details  Name: Wallie Guy MRN: [id number] Date of Birth: 16-Dec-1950   Medicare Observation Status Notification Given:  Yes    Nila Nephew, RN 06/10/2015, 10:36 AM

## 2015-06-10 NOTE — Evaluation (Signed)
Physical Therapy Evaluation Patient Details Name: Ethan Wade MRN: [id number] DOB: 31-Jan-1951 Today's Date: 06/10/2015   History of Present Illness  65 yo admitted with AMS, hepatic encephalopathy. PMHx: cirrhosis, hep C, DM, HTN  Clinical Impression  Pt is very pleasant, moving well, reports no falls in the last year. Brother checks on pt multiple times a day and helps him with his medication management. Pt without strength or mobility deficits at this time able to ambulate and perform stairs without difficulty. Pt and brother both report pt is back to baseline and agreeable to no further needs. Encouraged pt to look into long handled sponge to ease bathing as he reports his hernia pain limits him. No further therapy needs at this time with pt and brother aware and agreeable.     Follow Up Recommendations No PT follow up    Equipment Recommendations  None recommended by PT    Recommendations for Other Services       Precautions / Restrictions Precautions Precautions: None Restrictions Weight Bearing Restrictions: No      Mobility  Bed Mobility Overal bed mobility: Independent                Transfers Overall transfer level: Independent                  Ambulation/Gait Ambulation/Gait assistance: Independent Ambulation Distance (Feet): 500 Feet Assistive device: None Gait Pattern/deviations: WFL(Within Functional Limits)   Gait velocity interpretation: at or above normal speed for age/gender    Stairs Stairs: Yes Stairs assistance: Modified independent (Device/Increase time) Stair Management: One rail Left;Alternating pattern;Forwards Number of Stairs: 5    Wheelchair Mobility    Modified Rankin (Stroke Patients Only)       Balance Overall balance assessment: No apparent balance deficits (not formally assessed)                                           Pertinent Vitals/Pain Pain Assessment: No/denies pain    Home  Living Family/patient expects to be discharged to:: Private residence Living Arrangements: Alone Available Help at Discharge: Family;Available PRN/intermittently Type of Home: House Home Access: Stairs to enter Entrance Stairs-Rails: Psychiatric nurse of Steps: 2 Home Layout: One level Home Equipment: Cane - single point;Bedside commode      Prior Function Level of Independence: Independent         Comments: pt states he drives sometimes but brother in room states he doesn't allow him to drive much and takes him for food and appointments     Hand Dominance        Extremity/Trunk Assessment   Upper Extremity Assessment: Overall WFL for tasks assessed           Lower Extremity Assessment: Overall WFL for tasks assessed      Cervical / Trunk Assessment: Normal  Communication   Communication: No difficulties  Cognition Arousal/Alertness: Awake/alert Behavior During Therapy: WFL for tasks assessed/performed Overall Cognitive Status: Within Functional Limits for tasks assessed                      General Comments      Exercises        Assessment/Plan    PT Assessment Patent does not need any further PT services  PT Diagnosis Other (comment) (asked to evaluate due to encephalopathy)   PT Problem List  PT Treatment Interventions     PT Goals (Current goals can be found in the Care Plan section) Acute Rehab PT Goals PT Goal Formulation: All assessment and education complete, DC therapy    Frequency     Barriers to discharge        Co-evaluation               End of Session Equipment Utilized During Treatment: Gait belt Activity Tolerance: Patient tolerated treatment well Patient left: in chair;with call bell/phone within reach;with family/visitor present Nurse Communication: Mobility status    Functional Assessment Tool Used: clinical judgement Functional Limitation: Mobility: Walking and moving around Mobility:  Walking and Moving Around Current Status [phone]): 0 percent impaired, limited or restricted Mobility: Walking and Moving Around Goal Status [phone]): 0 percent impaired, limited or restricted Mobility: Walking and Moving Around Discharge Status [phone]): 0 percent impaired, limited or restricted    Time: 1211-1225 PT Time Calculation (min) (ACUTE ONLY): 14 min   Charges:   PT Evaluation $PT Eval Low Complexity: 1 Procedure     PT G Codes:   PT G-Codes **NOT FOR INPATIENT CLASS** Functional Assessment Tool Used: clinical judgement Functional Limitation: Mobility: Walking and moving around Mobility: Walking and Moving Around Current Status [phone]): 0 percent impaired, limited or restricted Mobility: Walking and Moving Around Goal Status [phone]): 0 percent impaired, limited or restricted Mobility: Walking and Moving Around Discharge Status [phone]): 0 percent impaired, limited or restricted    Melford Aase 06/10/2015, 12:32 PM Elwyn Reach, [place]

## 2015-06-10 NOTE — Discharge Summary (Signed)
Physician Discharge Summary  Ethan Wade [id number] DOB: Sep 11, 1950 DOA: 06/08/2015  PCP: Gerrit Heck, MD  Admit date: 06/08/2015 Discharge date: 06/10/2015  Time spent: > 35 minutes  Recommendations for Outpatient Follow-up:  1. Please ensure patient follows up with his GI specialist 2. Monitor platelet counts   Discharge Diagnoses:  Active Problems:   Thrombocytopenia ([place])   Chronic hepatitis C with cirrhosis (HCC)   HTN (hypertension)   Malnutrition of moderate degree (HCC)   Hepatic encephalopathy (HCC)   Liver cancer ([place])   Acute encephalopathy   Controlled type 2 DM with peripheral circulatory disorder (HCC)   Altered mental status   Discharge Condition: stable  Diet recommendation: heart healthy  Filed Weights   06/08/15 0955  Weight: 66.225 kg (146 lb)    History of present illness:  65 y/o with history of hepatic encephalopathy on Lactulose and rifaximin who presented to the hospital with AMS.  Hospital Course:  AMS/Hepatic encephalopathy - Secondary to noncompliance with medications. - Improved on lactulose and rifaximin in hospital. Patient to continue home medication regimen discussed with patient and brother at bedside. - Patient to follow-up with gastroenterologist on discharge for further evaluation recommendations.  Otherwise her known comorbidities above continue home medication regimen listed below  Procedures:  None  Consultations:  None  Discharge Exam: Filed Vitals:   06/10/15 1131 06/10/15 1237  BP: 101/73 103/73  Pulse: 93 76  Temp:  97.9 F (36.6 C)  Resp:  18    General: Pt in nad, alert and awake Cardiovascular: rrr, no mrg Respiratory: cta bl, no wheezes  Discharge Instructions   Discharge Instructions    Call MD for:  difficulty breathing, headache or visual disturbances    Complete by:  As directed      Call MD for:  difficulty breathing, headache or visual disturbances    Complete by:  As  directed      Call MD for:  severe uncontrolled pain    Complete by:  As directed      Call MD for:  temperature >100.4    Complete by:  As directed      Call MD for:  temperature >100.4    Complete by:  As directed      Diet - low sodium heart healthy    Complete by:  As directed      Diet - low sodium heart healthy    Complete by:  As directed      Discharge instructions    Complete by:  As directed   Please take your medication as recommended and follow up with GI specialist to discuss further recommendations for management of hepatic encephalopathy     Increase activity slowly    Complete by:  As directed      Increase activity slowly    Complete by:  As directed           Current Discharge Medication List    CONTINUE these medications which have NOT CHANGED   Details  Carboxymethylcellul-Glycerin (CLEAR EYES FOR DRY EYES) 1-0.25 % SOLN Apply 1-2 drops to eye daily as needed (for dry eyes). Reported on 05/20/2015    dicyclomine (BENTYL) 10 MG capsule Take 1 capsule (10 mg total) by mouth 2 (two) times daily. Qty: 60 capsule, Refills: 3    furosemide (LASIX) 40 MG tablet Take 40 mg by mouth daily.    insulin glargine (LANTUS) 100 UNIT/ML injection Inject 0.08 mLs (8 Units total) into the skin daily. Qty: 10  mL, Refills: 11    lactulose (CHRONULAC) 10 GM/15ML solution Take 45 mLs (30 g total) by mouth 3 (three) times daily. Qty: 240 mL, Refills: 0    nadolol (CORGARD) 20 MG tablet Take 20 mg by mouth daily.    oxyCODONE (OXY IR/ROXICODONE) 5 MG immediate release tablet Take 1 tablet (5 mg total) by mouth every 12 (twelve) hours as needed for severe pain or breakthrough pain (only for severe or breakthrough pain). Qty: 20 tablet, Refills: 0    SORAfenib (NEXAVAR) 200 MG tablet Take 400 mg by mouth every 12 (twelve) hours. Take on an empty stomach 1 hour before or 2 hours after meals.    spironolactone (ALDACTONE) 100 MG tablet Take 100 mg by mouth daily.    Tamsulosin  HCl (FLOMAX) 0.4 MG CAPS Take 0.4 mg by mouth daily.    XIFAXAN 550 MG TABS tablet Take 550 mg by mouth 2 (two) times daily.    zolpidem (AMBIEN) 5 MG tablet Take 5 mg by mouth at bedtime.      STOP taking these medications     ondansetron (ZOFRAN ODT) 4 MG disintegrating tablet        No Known Allergies    The results of significant diagnostics from this hospitalization (including imaging, microbiology, ancillary and laboratory) are listed below for reference.    Significant Diagnostic Studies: No results found.  Microbiology: No results found for this or any previous visit (from the past 240 hour(s)).   Labs: Basic Metabolic Panel:  Recent Labs Lab 06/08/15 1053 06/09/15 0522  NA 138 139  K 4.6 4.0  CL 101 105  CO2 23 23  GLUCOSE 145* 151*  BUN 20 20  CREATININE 1.09 0.93  CALCIUM 9.2 8.5*   Liver Function Tests:  Recent Labs Lab 06/08/15 1053 06/09/15 0522  AST 63* 51*  ALT 44 37  ALKPHOS 84 71  BILITOT 2.7* 2.9*  PROT 6.9 5.7*  ALBUMIN 2.9* 2.5*   No results for input(s): LIPASE, AMYLASE in the last 168 hours.  Recent Labs Lab 06/08/15 1028  AMMONIA 95*   CBC:  Recent Labs Lab 06/08/15 1053 06/10/15 1042  WBC 5.4 4.4  HGB 15.8 13.6  HCT 45.4 39.6  MCV 105.8* 104.2*  PLT 84* 64*   Cardiac Enzymes: No results for input(s): CKTOTAL, CKMB, CKMBINDEX, TROPONINI in the last 168 hours. BNP: BNP (last 3 results) No results for input(s): BNP in the last 8760 hours.  ProBNP (last 3 results) No results for input(s): PROBNP in the last 8760 hours.  CBG:  Recent Labs Lab 06/09/15 1201 06/09/15 1708 06/09/15 2128 06/10/15 0618 06/10/15 1134  GLUCAP 193* 247* 164* 110* 210*    Signed:  Velvet Bathe MD.  [facility] 06/10/2015, 1:27 PM

## 2015-06-10 NOTE — Care Management Note (Signed)
Case Management Note  Patient Details  Name: Ethan Wade MRN: [id number] Date of Birth: Jun 21, 1950  Subjective/Objective:         Admitted with hepatic encephalopathy           Action/Plan: Spoke with patient about discharge plan, he is able to answer all questions appropriately. Patient states that he lives alone but has a brother who lives 1/2 mile away and helps him with transportation and as needed.  He stated that he ambulates without assistance although he has a cane if needed. He stated that he does not need any home health therapy, has had home health in past and did not find it beneficial. Gave [name] general information about the [facility] for possible resources.          Expected Discharge Date:                  Expected Discharge Plan:  Home/Self Care  In-House Referral:  NA  Discharge planning Services  CM Consult  Post Acute Care Choice:  NA Choice offered to:     DME Arranged:    DME Agency:     HH Arranged:    HH Agency:     Status of Service:  In process, will continue to follow  Medicare Important Message Given:    Date Medicare IM Given:    Medicare IM give by:    Date Additional Medicare IM Given:    Additional Medicare Important Message give by:     If discussed at [place] of Stay Meetings, dates discussed:    Additional Comments:  Nila Nephew, RN 06/10/2015, 10:37 AM

## 2015-09-21 ENCOUNTER — Encounter (HOSPITAL_COMMUNITY): Payer: Self-pay | Admitting: Emergency Medicine

## 2015-09-21 ENCOUNTER — Observation Stay (HOSPITAL_COMMUNITY): Payer: Medicare Other

## 2015-09-21 ENCOUNTER — Observation Stay (HOSPITAL_COMMUNITY)
Admission: EM | Admit: 2015-09-21 | Discharge: 2015-09-23 | Disposition: A | Discharge status: Home / Self Care | Payer: Medicare Other | Attending: Internal Medicine | Admitting: Internal Medicine

## 2015-09-21 DIAGNOSIS — K7460 Unspecified cirrhosis of liver: Secondary | ICD-10-CM | POA: Insufficient documentation

## 2015-09-21 DIAGNOSIS — R7989 Other specified abnormal findings of blood chemistry: Secondary | ICD-10-CM | POA: Diagnosis present

## 2015-09-21 DIAGNOSIS — M13861 Other specified arthritis, right knee: Secondary | ICD-10-CM | POA: Diagnosis not present

## 2015-09-21 DIAGNOSIS — Z87891 Personal history of nicotine dependence: Secondary | ICD-10-CM | POA: Insufficient documentation

## 2015-09-21 DIAGNOSIS — D696 Thrombocytopenia, unspecified: Secondary | ICD-10-CM | POA: Diagnosis not present

## 2015-09-21 DIAGNOSIS — K429 Umbilical hernia without obstruction or gangrene: Secondary | ICD-10-CM | POA: Insufficient documentation

## 2015-09-21 DIAGNOSIS — Z794 Long term (current) use of insulin: Secondary | ICD-10-CM | POA: Diagnosis not present

## 2015-09-21 DIAGNOSIS — C220 Liver cell carcinoma: Secondary | ICD-10-CM | POA: Diagnosis present

## 2015-09-21 DIAGNOSIS — G9340 Encephalopathy, unspecified: Secondary | ICD-10-CM

## 2015-09-21 DIAGNOSIS — N400 Enlarged prostate without lower urinary tract symptoms: Secondary | ICD-10-CM | POA: Diagnosis not present

## 2015-09-21 DIAGNOSIS — I10 Essential (primary) hypertension: Secondary | ICD-10-CM | POA: Diagnosis not present

## 2015-09-21 DIAGNOSIS — K7682 Hepatic encephalopathy: Secondary | ICD-10-CM | POA: Diagnosis present

## 2015-09-21 DIAGNOSIS — E559 Vitamin D deficiency, unspecified: Secondary | ICD-10-CM | POA: Diagnosis not present

## 2015-09-21 DIAGNOSIS — L409 Psoriasis, unspecified: Secondary | ICD-10-CM | POA: Diagnosis not present

## 2015-09-21 DIAGNOSIS — I341 Nonrheumatic mitral (valve) prolapse: Secondary | ICD-10-CM | POA: Diagnosis not present

## 2015-09-21 DIAGNOSIS — R64 Cachexia: Secondary | ICD-10-CM | POA: Insufficient documentation

## 2015-09-21 DIAGNOSIS — R011 Cardiac murmur, unspecified: Secondary | ICD-10-CM | POA: Diagnosis not present

## 2015-09-21 DIAGNOSIS — B182 Chronic viral hepatitis C: Secondary | ICD-10-CM | POA: Diagnosis not present

## 2015-09-21 DIAGNOSIS — K7200 Acute and subacute hepatic failure without coma: Secondary | ICD-10-CM | POA: Diagnosis not present

## 2015-09-21 DIAGNOSIS — M13862 Other specified arthritis, left knee: Secondary | ICD-10-CM | POA: Diagnosis not present

## 2015-09-21 DIAGNOSIS — E1151 Type 2 diabetes mellitus with diabetic peripheral angiopathy without gangrene: Secondary | ICD-10-CM | POA: Diagnosis not present

## 2015-09-21 DIAGNOSIS — Z9114 Patient's other noncompliance with medication regimen: Secondary | ICD-10-CM | POA: Insufficient documentation

## 2015-09-21 LAB — CBC
HEMATOCRIT: 38.3 % — AB (ref 39.0–52.0)
HEMOGLOBIN: 12.9 g/dL — AB (ref 13.0–17.0)
MCH: 37.6 pg — AB (ref 26.0–34.0)
MCHC: 33.7 g/dL (ref 30.0–36.0)
MCV: 111.7 fL — ABNORMAL HIGH (ref 78.0–100.0)
Platelets: 95 10*3/uL — ABNORMAL LOW (ref 150–400)
RBC: 3.43 MIL/uL — ABNORMAL LOW (ref 4.22–5.81)
RDW: 15.1 % (ref 11.5–15.5)
WBC: 4.1 10*3/uL (ref 4.0–10.5)

## 2015-09-21 LAB — COMPREHENSIVE METABOLIC PANEL
ALT: 27 U/L (ref 17–63)
AST: 52 U/L — AB (ref 15–41)
Albumin: 2.3 g/dL — ABNORMAL LOW (ref 3.5–5.0)
Alkaline Phosphatase: 77 U/L (ref 38–126)
Anion gap: 12 (ref 5–15)
BUN: 20 mg/dL (ref 6–20)
CO2: 23 mmol/L (ref 22–32)
Calcium: 8.4 mg/dL — ABNORMAL LOW (ref 8.9–10.3)
Chloride: 96 mmol/L — ABNORMAL LOW (ref 101–111)
Creatinine, Ser: 1.2 mg/dL (ref 0.61–1.24)
GFR calc Af Amer: >60 mL/min (ref >60)
GLUCOSE: 157 mg/dL — AB (ref 65–99)
POTASSIUM: 4.3 mmol/L (ref 3.5–5.1)
Sodium: 131 mmol/L — ABNORMAL LOW (ref 135–145)
TOTAL PROTEIN: 6.1 g/dL — AB (ref 6.5–8.1)
Total Bilirubin: 5.6 mg/dL — ABNORMAL HIGH (ref 0.3–1.2)

## 2015-09-21 LAB — GLUCOSE, CAPILLARY
GLUCOSE-CAPILLARY: 149 mg/dL — AB (ref 65–99)
Glucose-Capillary: 128 mg/dL — ABNORMAL HIGH (ref 65–99)

## 2015-09-21 LAB — AMMONIA: Ammonia: 154 umol/L — ABNORMAL HIGH (ref 9–35)

## 2015-09-21 MED ORDER — RIFAXIMIN 550 MG PO TABS
550.0000 mg | ORAL_TABLET | Freq: Two times a day (BID) | ORAL | Status: DC
  Administered 2015-09-21 – 2015-09-23 (×4): 550 mg via ORAL
  Filled 2015-09-21 (×4): qty 1

## 2015-09-21 MED ORDER — LEDIPASVIR-SOFOSBUVIR 90-400 MG PO TABS
1.0000 | ORAL_TABLET | Freq: Every day | ORAL | Status: DC
  Administered 2015-09-22 – 2015-09-23 (×2): 1 via ORAL
  Filled 2015-09-21 (×2): qty 1

## 2015-09-21 MED ORDER — POLYVINYL ALCOHOL 1.4 % OP SOLN
1.0000 [drp] | OPHTHALMIC | Status: DC | PRN
  Filled 2015-09-21: qty 15

## 2015-09-21 MED ORDER — SORAFENIB TOSYLATE 200 MG PO TABS
400.0000 mg | ORAL_TABLET | Freq: Two times a day (BID) | ORAL | Status: DC
  Administered 2015-09-22 – 2015-09-23 (×3): 400 mg via ORAL
  Filled 2015-09-21 (×3): qty 2

## 2015-09-21 MED ORDER — ONDANSETRON HCL 4 MG/2ML IJ SOLN
4.0000 mg | Freq: Four times a day (QID) | INTRAMUSCULAR | Status: DC | PRN
Start: 2015-09-21 — End: 2015-09-23

## 2015-09-21 MED ORDER — SODIUM CHLORIDE 0.9 % IV BOLUS (SEPSIS)
250.0000 mL | Freq: Once | INTRAVENOUS | Status: AC
  Administered 2015-09-21: 250 mL via INTRAVENOUS

## 2015-09-21 MED ORDER — CARBOXYMETHYLCELLUL-GLYCERIN 1-0.25 % OP SOLN: 1.0000 [drp] | Freq: Every day | OPHTHALMIC | Status: DC | PRN

## 2015-09-21 MED ORDER — ONDANSETRON HCL 4 MG PO TABS: 4.0000 mg | ORAL_TABLET | Freq: Four times a day (QID) | ORAL | Status: DC | PRN

## 2015-09-21 MED ORDER — SPIRONOLACTONE 100 MG PO TABS
100.0000 mg | ORAL_TABLET | Freq: Every day | ORAL | Status: DC
Start: 2015-09-21 — End: 2015-09-21

## 2015-09-21 MED ORDER — RIBAVIRIN 200 MG PO TABS
200.0000 mg | ORAL_TABLET | Freq: Every day | ORAL | Status: DC
  Administered 2015-09-22: 200 mg via ORAL
  Filled 2015-09-21 (×2): qty 1

## 2015-09-21 MED ORDER — LACTULOSE 10 GM/15ML PO SOLN
30.0000 g | Freq: Three times a day (TID) | ORAL | Status: DC
Start: 2015-09-21 — End: 2015-09-23
  Administered 2015-09-21 – 2015-09-23 (×5): 30 g via ORAL
  Filled 2015-09-21 (×5): qty 45

## 2015-09-21 MED ORDER — DICYCLOMINE HCL 10 MG PO CAPS
10.0000 mg | ORAL_CAPSULE | Freq: Two times a day (BID) | ORAL | Status: DC
  Administered 2015-09-21 – 2015-09-23 (×4): 10 mg via ORAL
  Filled 2015-09-21 (×4): qty 1

## 2015-09-21 MED ORDER — INSULIN ASPART 100 UNIT/ML ~~LOC~~ SOLN
0.0000 [IU] | Freq: Every day | SUBCUTANEOUS | Status: DC
  Administered 2015-09-22: 3 [IU] via SUBCUTANEOUS

## 2015-09-21 MED ORDER — TAMSULOSIN HCL 0.4 MG PO CAPS: 0.4000 mg | ORAL_CAPSULE | Freq: Every day | ORAL | Status: DC

## 2015-09-21 MED ORDER — INSULIN ASPART 100 UNIT/ML ~~LOC~~ SOLN
0.0000 [IU] | Freq: Three times a day (TID) | SUBCUTANEOUS | Status: DC
  Administered 2015-09-22 (×2): 3 [IU] via SUBCUTANEOUS
  Administered 2015-09-22: 2 [IU] via SUBCUTANEOUS
  Administered 2015-09-23: 3 [IU] via SUBCUTANEOUS
  Administered 2015-09-23: 7 [IU] via SUBCUTANEOUS

## 2015-09-21 MED ORDER — LACTULOSE 10 GM/15ML PO SOLN
30.0000 g | Freq: Once | ORAL | Status: AC
  Administered 2015-09-21: 30 g via ORAL
  Filled 2015-09-21: qty 45

## 2015-09-21 MED ORDER — TAMSULOSIN HCL 0.4 MG PO CAPS
0.4000 mg | ORAL_CAPSULE | Freq: Every day | ORAL | Status: DC
  Administered 2015-09-21 – 2015-09-23 (×3): 0.4 mg via ORAL
  Filled 2015-09-21 (×4): qty 1

## 2015-09-21 MED ORDER — SODIUM CHLORIDE 0.9 % IV SOLN
INTRAVENOUS | Status: AC
  Administered 2015-09-21: 16:00:00 via INTRAVENOUS

## 2015-09-21 MED ORDER — INSULIN GLARGINE 100 UNIT/ML ~~LOC~~ SOLN
8.0000 [IU] | Freq: Every day | SUBCUTANEOUS | Status: DC
  Administered 2015-09-22: 8 [IU] via SUBCUTANEOUS
  Filled 2015-09-21 (×2): qty 0.08

## 2015-09-21 MED ORDER — NADOLOL 20 MG PO TABS
20.0000 mg | ORAL_TABLET | Freq: Every day | ORAL | Status: DC
  Administered 2015-09-22 – 2015-09-23 (×2): 20 mg via ORAL
  Filled 2015-09-21 (×2): qty 1

## 2015-09-21 MED ORDER — DICYCLOMINE HCL 10 MG PO CAPS: 10.0000 mg | ORAL_CAPSULE | Freq: Two times a day (BID) | ORAL | Status: DC

## 2015-09-21 NOTE — ED Provider Notes (Signed)
CSN: [id number]     Arrival date & time 09/21/15  1141 History   First MD Initiated Contact with Patient 09/21/15 1211     Chief Complaint  Patient presents with  . Altered Mental Status     (Consider location/radiation/quality/duration/timing/severity/associated sxs/prior Treatment) HPI Comments: Ethan Wade is a 65 y.o. male with history of liver cancer actively undergoing chemotherapy and HCV tx with [facility] presents to ED with altered mental status. His brother and daughter are at the bedside. His brother states patient was at his baseline on Thursday; however, he has since become progressively more confused and lethargic. Patient is currently on lactulose and "fluid pills" for management of ammonia levels and swelling. Brother states pt is not always compliant with his lactulose and when non-compliant his ammonia level becomes elevated, subsequently pt becomes confused, combative, and lethargic. Review of records suggest medication non-compliance with prior hospitalizations due to encephalopathy secondary to high ammonia. Patient's only response to questions is "ya."   Level V caveat - AMS.   Patient is a 65 y.o. male presenting with altered mental status. The history is provided by a relative and medical records.  Altered Mental Status   Past Medical History  Diagnosis Date  . Macrocytosis   . Thrombocytopenia ([place])   . Esophageal bleed, non-variceal 2006  . HTN (hypertension)   . Psoriasis   . Mitral valve prolapse     OCCAS FLUTTER FEELING  . Vitamin D deficiency   . Umbilical hernia     OCCAS DISCOMFORT  . Prostate enlargement     PT TOLD "NORMAL FOR AGE" - TAKES FINASTERIDE AND FLOMAX  . Heart murmur   . Type II diabetes mellitus ([name])   . Rheumatic fever 1950's  . Hepatitis C 1980's  . Arthritis     "joint stiffness in the knees" (11/10/2013)  . Liver cancer ([place])   . Ascites   . Hepatic encephalopathy ([name]) 05/2015   Past Surgical History  Procedure Laterality  Date  . Tonsilectomy, adenoidectomy, bilateral myringotomy and tubes  child  . Umbilical hernia repair  03/09/2012    Procedure: HERNIA REPAIR UMBILICAL ADULT;  Surgeon: Edward Jolly, MD;  Location: [facility];  Service: General;  Laterality: N/A;  Repair Umbilical Hernia with Mesh  . Insertion of mesh  03/09/2012    Procedure: INSERTION OF MESH;  Surgeon: Edward Jolly, MD;  Location: [facility];  Service: General;  Laterality: N/A;  . Tonsillectomy    . Hernia repair     Family History  Problem Relation Age of Onset  . Prostate cancer Brother   . COPD Mother   . Heart disease Father   . Diabetes Father   . Alcohol abuse Father   . Liver cancer Brother   . Colon cancer Brother   . COPD Maternal Grandfather    Social History  Substance Use Topics  . Smoking status: Former Smoker -- 20 years    Types: Cigars    Start date: 12/22/2005  . Smokeless tobacco: Former Systems developer    Types: Snuff     Comment: "quit smoking ~ 2009; quit snuff in ~ 2013"  . Alcohol Use: Yes     Comment: "quit in late April 2015"    Review of Systems  Unable to perform ROS: Mental status change      Allergies  Review of patient's allergies indicates no known allergies.  Home Medications   Prior to Admission medications   Medication Sig Start Date End Date Taking?  Authorizing Provider  Carboxymethylcellul-Glycerin (CLEAR EYES FOR DRY EYES) 1-0.25 % SOLN Apply 1-2 drops to eye daily as needed (for dry eyes). Reported on 05/20/2015   Yes Historical Provider, MD  dicyclomine (BENTYL) 10 MG capsule Take 1 capsule (10 mg total) by mouth 2 (two) times daily. 11/13/13  Yes Ripudeep Krystal Eaton, MD  furosemide (LASIX) 40 MG tablet Take 40 mg by mouth daily.   Yes Historical Provider, MD  HARVONI 90-400 MG TABS Take 1 tablet by mouth daily. 08/19/15  Yes Historical Provider, MD  ibuprofen (ADVIL,MOTRIN) 600 MG tablet Take 600 mg by mouth every 6 (six) hours as needed. pain   Yes Historical Provider, MD  insulin  glargine (LANTUS) 100 UNIT/ML injection Inject 0.08 mLs (8 Units total) into the skin daily. 04/04/14  Yes Shanker Kristeen Mans, MD  lactulose (CHRONULAC) 10 GM/15ML solution Take 45 mLs (30 g total) by mouth 3 (three) times daily. 02/25/15  Yes Reyne Dumas, MD  nadolol (CORGARD) 20 MG tablet Take 20 mg by mouth daily.   Yes Historical Provider, MD  oxyCODONE (OXY IR/ROXICODONE) 5 MG immediate release tablet Take 1 tablet (5 mg total) by mouth every 12 (twelve) hours as needed for severe pain or breakthrough pain (only for severe or breakthrough pain). 11/13/13  Yes Ripudeep K Rai, MD  RIBASPHERE 200 MG tablet Take 200 mg by mouth daily. 08/19/15  Yes Historical Provider, MD  SORAfenib (NEXAVAR) 200 MG tablet Take 400 mg by mouth every 12 (twelve) hours. Take on an empty stomach 1 hour before or 2 hours after meals.   Yes Historical Provider, MD  spironolactone (ALDACTONE) 100 MG tablet Take 100 mg by mouth daily.   Yes Historical Provider, MD  Tamsulosin HCl (FLOMAX) 0.4 MG CAPS Take 0.4 mg by mouth daily.   Yes Historical Provider, MD  XIFAXAN 550 MG TABS tablet Take 550 mg by mouth 2 (two) times daily. 05/17/15  Yes Historical Provider, MD  zolpidem (AMBIEN) 5 MG tablet Take 5 mg by mouth at bedtime. 05/30/15  Yes Historical Provider, MD   BP 103/65 mmHg  Pulse 63  Temp(Src) 98.9 F (37.2 C) (Oral)  Resp 20  Ht 5\' 11"  (1.803 m)  Wt 62.143 kg  BMI 19.12 kg/m2  SpO2 100% Physical Exam  Constitutional: He appears cachectic. He is easily aroused. He appears ill. No distress.  HENT:  Head: Normocephalic and atraumatic.  Mouth/Throat: Oropharynx is clear and moist.  Eyes: Conjunctivae and EOM are normal. Pupils are equal, round, and reactive to light. Right eye exhibits no discharge. Left eye exhibits no discharge. No scleral icterus.  Neck: Normal range of motion. Neck supple.  Cardiovascular: Normal rate, regular rhythm, normal heart sounds and intact distal pulses.   No murmur  heard. Pulmonary/Chest: Effort normal and breath sounds normal. No respiratory distress.  Abdominal: Soft. Bowel sounds are normal. He exhibits no distension and no ascites. There is no tenderness. There is no rebound and no guarding.  Musculoskeletal: He exhibits edema ( 2+ pitting edema in lower extremities b/l).  Lymphadenopathy:    He has no cervical adenopathy.  Neurological: He is easily aroused. Coordination normal.  Unable to perform secondary to mental status change. Asterixis noted b/l  Skin: Skin is warm and dry. He is not diaphoretic.  Mild jaundice noted to skin. Palmar erythema noted.     ED Course  Procedures (including critical care time) Labs Review Labs Reviewed  COMPREHENSIVE METABOLIC PANEL - Abnormal; Notable for the following:  Sodium 131 (*)    Chloride 96 (*)    Glucose, Bld 157 (*)    Calcium 8.4 (*)    Total Protein 6.1 (*)    Albumin 2.3 (*)    AST 52 (*)    Total Bilirubin 5.6 (*)    All other components within normal limits  CBC - Abnormal; Notable for the following:    RBC 3.43 (*)    Hemoglobin 12.9 (*)    HCT 38.3 (*)    MCV 111.7 (*)    MCH 37.6 (*)    Platelets 95 (*)    All other components within normal limits  AMMONIA - Abnormal; Notable for the following:    Ammonia 154 (*)    All other components within normal limits  GLUCOSE, CAPILLARY - Abnormal; Notable for the following:    Glucose-Capillary 149 (*)    All other components within normal limits  URINALYSIS, ROUTINE W REFLEX MICROSCOPIC (NOT AT [facility])  HEMOGLOBIN A1C  COMPREHENSIVE METABOLIC PANEL  CBC    Imaging Review Dg Chest Port 1 View  09/21/2015  CLINICAL DATA:  Encephalopathy EXAM: PORTABLE CHEST 1 VIEW COMPARISON:  05/08/2015 FINDINGS: Normal mediastinum and cardiac silhouette. Normal pulmonary vasculature. No evidence of effusion, infiltrate, or pneumothorax. No acute bony abnormality. IMPRESSION: No acute cardiopulmonary process. Electronically Signed   By: Suzy Bouchard M.D.   On: 09/21/2015 15:59   I have personally reviewed and evaluated these images and lab results as part of my medical decision-making.   EKG Interpretation None      MDM   Final diagnoses:  Encephalopathy  Increased ammonia level    Patient is afebrile and cachectic. Blood pressures are soft. He is altered, responding to every question with "ya." Physical exam is remarkable for mild jaundice and asterixis. CXR negative. CBC remarkable for mildly lower hemoglobin; however, stable compared to previous. CMP remarkable for elevated total bilirubin, moreso than in the past. Ammonia significantly elevated. Given medical history of medication non-compliance suspect hepatic encephalopathy. Lactulose initiated. Will consult hospitalist for admission for AMS and elevated ammonia.     2:56 PM: Dyanne Carrel NP of [facility] consulted. Appreciated her time and input. Will admit to observation med-surg.   Roxanna Mew, PA-C 09/21/15 [place], MD 09/22/15 [phone]

## 2015-09-21 NOTE — ED Notes (Signed)
Patient brought back to room, undressed, in gown, on monitor, continuous pulse oximetry and blood pressure cuff; visitors at bedside

## 2015-09-21 NOTE — ED Notes (Signed)
Brother stated, My brother confused. He has Liver cancer, and hepatis C.  I think his ammonia levels are high. Every question I ask him he answers by saying "Yes"

## 2015-09-21 NOTE — H&P (Signed)
History and Physical    Ethan Wade [name] DOB: [birth year] DOA: 09/21/2015  PCP: Gerrit Heck, MD Patient coming from: home  Chief Complaint: ams  HPI: Ethan Wade is a 65 y.o. male with medical history significant for cirrhosis hepatocellular carcinoma on Nexavar, hepatitis C being treated at [facility], diabetes presents to the emergency department with the chief complaint of altered mental status. Initial evaluation reveals elevated ammonia level.   Information is obtained from the brother who is at the bedside and his primary care giver. He stated that he spoke to his [place] telephone yesterday afternoon and he seemed to be "okay". This morning he got to the house and patient was clearly lethargic and confused. Patient is in charge of his own medications and typically does a good job with compliance according to his brother. However he is on lactulose 3 times a day which "tears him up". Other does have some concern that patient intermittently skips doses of lactulose. Brother reports patient has been in his usual state of health which is frail up until this morning. There is been no complaints of abdominal pain nausea vomiting headache. No complaints of dysuria hematuria frequency or urgency. No reports of any diarrhea. No fever chills cough.  Of note home medications include Nexavar for hepatocellular carcinoma.     ED Course: Patient is given 30g lactulose.  Review of Systems: As per HPI otherwise 10 point review of systems negative via his brother who is primary caregiver  Ambulatory Status: Ambulates independently with no recent falls  Past Medical History  Diagnosis Date  . Macrocytosis   . Thrombocytopenia ([place])   . Esophageal bleed, non-variceal 2006  . HTN (hypertension)   . Psoriasis   . Mitral valve prolapse     OCCAS FLUTTER FEELING  . Vitamin D deficiency   . Umbilical hernia     OCCAS DISCOMFORT  . Prostate enlargement     PT TOLD "NORMAL FOR  AGE" - TAKES FINASTERIDE AND FLOMAX  . Heart murmur   . Type II diabetes mellitus ([place])   . Rheumatic fever 1950's  . Hepatitis C 1980's  . Arthritis     "joint stiffness in the knees" (11/10/2013)  . Liver cancer ([place])   . Ascites   . Hepatic encephalopathy ([place]) 05/2015    Past Surgical History  Procedure Laterality Date  . Tonsilectomy, adenoidectomy, bilateral myringotomy and tubes  child  . Umbilical hernia repair  03/09/2012    Procedure: HERNIA REPAIR UMBILICAL ADULT;  Surgeon: Edward Jolly, MD;  Location: [facility];  Service: General;  Laterality: N/A;  Repair Umbilical Hernia with Mesh  . Insertion of mesh  03/09/2012    Procedure: INSERTION OF MESH;  Surgeon: Edward Jolly, MD;  Location: [facility];  Service: General;  Laterality: N/A;  . Inguinal hernia repair Left 1970's  . Hernia repair    . Tonsillectomy      Social History   Social History  . Marital Status: Single    Spouse Name: N/A  . Number of Children: N/A  . Years of Education: N/A   Occupational History  . Not on file.   Social History Main Topics  . Smoking status: Former Smoker -- 20 years    Types: Cigars    Start date: 12/22/2005  . Smokeless tobacco: Former Systems developer    Types: Snuff     Comment: "quit smoking ~ 2009; quit snuff in ~ 2013"  . Alcohol Use: Yes     Comment: "quit  in late April 2015"  . 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 Sexual Activity: No   Other Topics Concern  . Not on file   Social History Narrative    No Known Allergies  Family History  Problem Relation Age of Onset  . Prostate cancer Brother   . COPD Mother   . Heart disease Father   . Diabetes Father   . Alcohol abuse Father   . Liver cancer Brother   . Colon cancer Brother   . COPD Maternal Grandfather     Prior to Admission medications   Medication Sig Start Date End Date Taking? Authorizing Provider  Carboxymethylcellul-Glycerin  (CLEAR EYES FOR DRY EYES) 1-0.25 % SOLN Apply 1-2 drops to eye daily as needed (for dry eyes). Reported on 05/20/2015   Yes Historical Provider, MD  dicyclomine (BENTYL) 10 MG capsule Take 1 capsule (10 mg total) by mouth 2 (two) times daily. 11/13/13  Yes Ripudeep Krystal Eaton, MD  furosemide (LASIX) 40 MG tablet Take 40 mg by mouth daily.   Yes Historical Provider, MD  HARVONI 90-400 MG TABS Take 1 tablet by mouth daily. 08/19/15  Yes Historical Provider, MD  ibuprofen (ADVIL,MOTRIN) 600 MG tablet Take 600 mg by mouth every 6 (six) hours as needed. pain   Yes Historical Provider, MD  insulin glargine (LANTUS) 100 UNIT/ML injection Inject 0.08 mLs (8 Units total) into the skin daily. 04/04/14  Yes Shanker Kristeen Mans, MD  lactulose (CHRONULAC) 10 GM/15ML solution Take 45 mLs (30 g total) by mouth 3 (three) times daily. 02/25/15  Yes Reyne Dumas, MD  nadolol (CORGARD) 20 MG tablet Take 20 mg by mouth daily.   Yes Historical Provider, MD  oxyCODONE (OXY IR/ROXICODONE) 5 MG immediate release tablet Take 1 tablet (5 mg total) by mouth every 12 (twelve) hours as needed for severe pain or breakthrough pain (only for severe or breakthrough pain). 11/13/13  Yes Ripudeep K Rai, MD  RIBASPHERE 200 MG tablet Take 200 mg by mouth daily. 08/19/15  Yes Historical Provider, MD  SORAfenib (NEXAVAR) 200 MG tablet Take 400 mg by mouth every 12 (twelve) hours. Take on an empty stomach 1 hour before or 2 hours after meals.   Yes Historical Provider, MD  spironolactone (ALDACTONE) 100 MG tablet Take 100 mg by mouth daily.   Yes Historical Provider, MD  Tamsulosin HCl (FLOMAX) 0.4 MG CAPS Take 0.4 mg by mouth daily.   Yes Historical Provider, MD  XIFAXAN 550 MG TABS tablet Take 550 mg by mouth 2 (two) times daily. 05/17/15  Yes Historical Provider, MD  zolpidem (AMBIEN) 5 MG tablet Take 5 mg by mouth at bedtime. 05/30/15  Yes Historical Provider, MD    Physical Exam: Filed Vitals:   09/21/15 1148 09/21/15 1201  BP: 96/66 97/77    Pulse: 73 65  Temp: 97.9 F (36.6 C)   Resp: 17 18  Height: 5\' 11"  (1.803 m)   Weight: 67.586 kg (149 lb)   SpO2: 99% 98%     General:  Appears Quite lethargic but responds to verbal stimuli, quite jaundice Eyes:  PERRL, EOMI, normal lids, iris +scleral icterus ENT:  grossly normal hearing, lips & tongue, mucous membranes of his mouth are pink slightly dry Neck:  no LAD, masses or thyromegaly Cardiovascular:  RRR, no m/r/g. No LE edema.  Respiratory:  CTA bilaterally, no w/r/r. Normal respiratory effort. Abdomen:  soft, ntnd,  positive bowel sounds but sluggish no guarding or rebounding Skin:  Mild scattered erythema bilateral ankles shins consistent with psoriasis Musculoskeletal:  grossly normal tone BUE/BLE, good ROM, no bony abnormality Psychiatric:  grossly normal mood and affect, speech fluent and appropriate, AOx3 Neurologic:  Quite lethargic but arouses to verbal stimuli, unable to follow commands, answers "yes" to everything  Labs on Admission: I have personally reviewed following labs and imaging studies  CBC:  Recent Labs Lab 09/21/15 1210  WBC 4.1  HGB 12.9*  HCT 38.3*  MCV 111.7*  PLT 95*   Basic Metabolic Panel:  Recent Labs Lab 09/21/15 1210  NA 131*  K 4.3  CL 96*  CO2 23  GLUCOSE 157*  BUN 20  CREATININE 1.20  CALCIUM 8.4*   GFR: Estimated Creatinine Clearance: 57.9 mL/min (by C-G formula based on Cr of 1.2). Liver Function Tests:  Recent Labs Lab 09/21/15 1210  AST 52*  ALT 27  ALKPHOS 77  BILITOT 5.6*  PROT 6.1*  ALBUMIN 2.3*   No results for input(s): LIPASE, AMYLASE in the last 168 hours.  Recent Labs Lab 09/21/15 1212  AMMONIA 154*   Coagulation Profile: No results for input(s): INR, PROTIME in the last 168 hours. Cardiac Enzymes: No results for input(s): CKTOTAL, CKMB, CKMBINDEX, TROPONINI in the last 168 hours. BNP (last 3 results) No results for input(s): PROBNP in the last 8760 hours. HbA1C: No results for  input(s): HGBA1C in the last 72 hours. CBG: No results for input(s): GLUCAP in the last 168 hours. Lipid Profile: No results for input(s): CHOL, HDL, LDLCALC, TRIG, CHOLHDL, LDLDIRECT in the last 72 hours. Thyroid Function Tests: No results for input(s): TSH, T4TOTAL, FREET4, T3FREE, THYROIDAB in the last 72 hours. Anemia Panel: No results for input(s): VITAMINB12, FOLATE, FERRITIN, TIBC, IRON, RETICCTPCT in the last 72 hours. Urine analysis:    Component Value Date/Time   COLORURINE ORANGE* 05/21/2015 0058   APPEARANCEUR CLEAR 05/21/2015 0058   LABSPEC 1.030 05/21/2015 0058   PHURINE 5.0 05/21/2015 0058   GLUCOSEU NEGATIVE 05/21/2015 0058   HGBUR NEGATIVE 05/21/2015 0058   BILIRUBINUR SMALL* 05/21/2015 0058   KETONESUR NEGATIVE 05/21/2015 0058   PROTEINUR NEGATIVE 05/21/2015 0058   UROBILINOGEN 2.0* 08/17/2014 0421   NITRITE NEGATIVE 05/21/2015 0058   LEUKOCYTESUR TRACE* 05/21/2015 0058    Creatinine Clearance: Estimated Creatinine Clearance: 57.9 mL/min (by C-G formula based on Cr of 1.2).  Sepsis Labs: @LABRCNTIP (procalcitonin:4,lacticidven:4) )No results found for this or any previous visit (from the past 240 hour(s)).   Radiological Exams on Admission: No results found.  EKG:   Assessment/Plan Principal Problem:   Acute hepatic encephalopathy (HCC) Active Problems:   Thrombocytopenia ([place])   Chronic hepatitis C with cirrhosis (HCC)   HTN (hypertension)   Psoriasis   Chronic liver disease and cirrhosis (HCC)   Increased ammonia level   Hepatocellular carcinoma ([place])   Controlled type 2 DM with peripheral circulatory disorder ([place])   #1. Acute hepatic encephalopathy. Likely related to intermittent compliance with lactulose at home. Ammonia level 154 on admission. Total bilirubin 5.6 which appears to be significantly higher. -Admit -Continue lactulose 3 times a day -We'll hold his diuretics for now -Very gentle IV fluids -Continue xifaxan -recheck ammonia  level in am  #2. Hepatocellular carcinoma. He sees physician at [facility]. Home medications include Nexavar 400mg  BID. AST slightly elevated ALT and alkaline phosphatase within the limits of normal total bilirubin 5.6 -continue home meds  #3. Diabetes type 2. Serum glucose 157 on admission. Home  medications include Lantus 8 units daily -Obtain a hemoglobin A1c -Use sliding scale insulin for optimal control -Continue home Lantus  #4. HTN. Blood pressure somewhat soft on admission -We'll monitor closely -Hold home Lasix -Hold home corgard for now -gentle iv fluids  #5. Chronic liver and cirrhosis. See above. -Continue home medications. -Follow-up with physicians at [facility] and [place]  #6. Thrombocytopenia. Platelets at 95 on admission. This is close to baseline according to chart review secondary to cirrhosis -No signs symptoms obvious bleeding -Continue to monitor   DVT prophylaxis: scd's  Code Status: full  Family Communication: brother at bedside  Disposition Plan: home  Consults called: none  Admission status: obs    Radene Gunning MD [facility]  If 7PM-7AM, please contact night-coverage [email or web address] Password [facility]  09/21/2015, 3:26 PM

## 2015-09-22 DIAGNOSIS — K769 Liver disease, unspecified: Secondary | ICD-10-CM

## 2015-09-22 DIAGNOSIS — K7200 Acute and subacute hepatic failure without coma: Secondary | ICD-10-CM

## 2015-09-22 DIAGNOSIS — E1151 Type 2 diabetes mellitus with diabetic peripheral angiopathy without gangrene: Secondary | ICD-10-CM | POA: Diagnosis not present

## 2015-09-22 DIAGNOSIS — K7460 Unspecified cirrhosis of liver: Secondary | ICD-10-CM | POA: Diagnosis not present

## 2015-09-22 DIAGNOSIS — B182 Chronic viral hepatitis C: Secondary | ICD-10-CM | POA: Diagnosis not present

## 2015-09-22 LAB — CBC
HCT: 38.2 % — ABNORMAL LOW (ref 39.0–52.0)
Hemoglobin: 12.2 g/dL — ABNORMAL LOW (ref 13.0–17.0)
MCH: 36.5 pg — ABNORMAL HIGH (ref 26.0–34.0)
MCHC: 31.9 g/dL (ref 30.0–36.0)
MCV: 114.4 fL — AB (ref 78.0–100.0)
PLATELETS: 83 10*3/uL — AB (ref 150–400)
RBC: 3.34 MIL/uL — ABNORMAL LOW (ref 4.22–5.81)
RDW: 15 % (ref 11.5–15.5)
WBC: 4.7 10*3/uL (ref 4.0–10.5)

## 2015-09-22 LAB — GLUCOSE, CAPILLARY
GLUCOSE-CAPILLARY: 214 mg/dL — AB (ref 65–99)
GLUCOSE-CAPILLARY: 188 mg/dL — AB (ref 65–99)
Glucose-Capillary: 247 mg/dL — ABNORMAL HIGH (ref 65–99)
Glucose-Capillary: 261 mg/dL — ABNORMAL HIGH (ref 65–99)

## 2015-09-22 LAB — COMPREHENSIVE METABOLIC PANEL
ALK PHOS: 69 U/L (ref 38–126)
ALT: 25 U/L (ref 17–63)
AST: 44 U/L — AB (ref 15–41)
Albumin: 2.1 g/dL — ABNORMAL LOW (ref 3.5–5.0)
Anion gap: 9 (ref 5–15)
BILIRUBIN TOTAL: 5.2 mg/dL — AB (ref 0.3–1.2)
BUN: 21 mg/dL — AB (ref 6–20)
CALCIUM: 8.3 mg/dL — AB (ref 8.9–10.3)
CHLORIDE: 100 mmol/L — AB (ref 101–111)
CO2: 25 mmol/L (ref 22–32)
CREATININE: 1.32 mg/dL — AB (ref 0.61–1.24)
GFR calc Af Amer: >60 mL/min (ref >60)
GFR, EST NON AFRICAN AMERICAN: 55 mL/min — AB (ref >60)
Glucose, Bld: 245 mg/dL — ABNORMAL HIGH (ref 65–99)
Potassium: 3.9 mmol/L (ref 3.5–5.1)
Sodium: 134 mmol/L — ABNORMAL LOW (ref 135–145)
TOTAL PROTEIN: 5.8 g/dL — AB (ref 6.5–8.1)

## 2015-09-22 LAB — AMMONIA: AMMONIA: 135 umol/L — AB (ref 9–35)

## 2015-09-22 MED ORDER — INSULIN GLARGINE 100 UNIT/ML ~~LOC~~ SOLN
10.0000 [IU] | Freq: Every day | SUBCUTANEOUS | Status: DC
  Administered 2015-09-23: 10 [IU] via SUBCUTANEOUS
  Filled 2015-09-22: qty 0.1

## 2015-09-22 MED ORDER — OXYCODONE HCL 5 MG PO TABS
5.0000 mg | ORAL_TABLET | Freq: Two times a day (BID) | ORAL | Status: DC | PRN
Start: 2015-09-22 — End: 2015-09-23
  Administered 2015-09-22: 5 mg via ORAL
  Filled 2015-09-22: qty 1

## 2015-09-22 MED ORDER — RIBAVIRIN 200 MG PO TABS
200.0000 mg | ORAL_TABLET | Freq: Two times a day (BID) | ORAL | Status: DC
  Administered 2015-09-23: 200 mg via ORAL
  Filled 2015-09-22: qty 1

## 2015-09-22 NOTE — Progress Notes (Signed)
PROGRESS NOTE    Ethan Wade  [id number] DOB: Jan 31, 1951 DOA: 09/21/2015 PCP: Gerrit Heck, MD    Brief Narrative: Ethan Wade is a 65 y.o. male with a Past Medical History of hep c being treated at [facility], hepatocellular ca on Nexavar, who presents with worsening acute encephalopathy.    Assessment & Plan:   Principal Problem:   Acute hepatic encephalopathy (HCC) Active Problems:   Thrombocytopenia ([place])   Chronic hepatitis C with cirrhosis (HCC)   HTN (hypertension)   Psoriasis   Chronic liver disease and cirrhosis (HCC)   Increased ammonia level   Hepatocellular carcinoma (HCC)   Controlled type 2 DM with peripheral circulatory disorder ([place])   Acute hepatic encephalopathy: Admit for lactulose three times a day.  Much improved after lactulose.  Resume rifaximin.    Hepatitis C:  Resume the treatment.    Diabetes Mellitus: CBG (last 3)   Recent Labs  09/22/15 0811 09/22/15 1236 09/22/15 1711  GLUCAP 214* 188* 247*    Resume SSI.  ON 8 units of lantus, increase lantus to 10 units.    Liver cirrhosis: Resume nadolol.     DVT prophylaxis: SCD's Code Status: (Full/) Family Communication: brother at bedside.  Disposition Plan: pending resolution of the encephalopathy   Consultants:   none   Procedures: none   Antimicrobials: none   Subjective: No nausea, vomiting. abd pain .   Objective: Filed Vitals:   09/21/15 2136 09/22/15 0600 09/22/15 0641 09/22/15 1234  BP: 103/70  103/75 105/69  Pulse: 66  76 81  Temp: 99.2 F (37.3 C)  98.3 F (36.8 C) 98 F (36.7 C)  TempSrc: Oral  Oral Oral  Resp: 16  16 18   Height:      Weight:  62.143 kg (137 lb)    SpO2: 99%  99% 100%    Intake/Output Summary (Last 24 hours) at 09/22/15 1725 Last data filed at 09/22/15 1634  Gross per 24 hour  Intake    660 ml  Output      0 ml  Net    660 ml   Filed Weights   09/21/15 1148 09/21/15 1619 09/22/15 0600  Weight: 67.586 kg  (149 lb) 62.143 kg (137 lb) 62.143 kg (137 lb)    Examination:  General exam: Appears calm and comfortable  Respiratory system: Clear to auscultation. Respiratory effort normal. Cardiovascular system: S1 & S2 heard, RRR. No JVD, murmurs, rubs, gallops or clicks. No pedal edema. Gastrointestinal system: Abdomen is nondistended, soft and nontender. No organomegaly or masses felt. Normal bowel sounds heard. Central nervous system: Alert but slightly confused.  No focal neurological deficits. Extremities: Symmetric 5 x 5 power. Skin: No rashes, lesions or ulcers     Data Reviewed: I have personally reviewed following labs and imaging studies  CBC:  Recent Labs Lab 09/21/15 1210 09/22/15 0448  WBC 4.1 4.7  HGB 12.9* 12.2*  HCT 38.3* 38.2*  MCV 111.7* 114.4*  PLT 95* 83*   Basic Metabolic Panel:  Recent Labs Lab 09/21/15 1210 09/22/15 0448  NA 131* 134*  K 4.3 3.9  CL 96* 100*  CO2 23 25  GLUCOSE 157* 245*  BUN 20 21*  CREATININE 1.20 1.32*  CALCIUM 8.4* 8.3*   GFR: Estimated Creatinine Clearance: 48.4 mL/min (by C-G formula based on Cr of 1.32). Liver Function Tests:  Recent Labs Lab 09/21/15 1210 09/22/15 0448  AST 52* 44*  ALT 27 25  ALKPHOS 77 69  BILITOT 5.6* 5.2*  PROT 6.1*  5.8*  ALBUMIN 2.3* 2.1*   No results for input(s): LIPASE, AMYLASE in the last 168 hours.  Recent Labs Lab 09/21/15 1212 09/22/15 0653  AMMONIA 154* 135*   Coagulation Profile: No results for input(s): INR, PROTIME in the last 168 hours. Cardiac Enzymes: No results for input(s): CKTOTAL, CKMB, CKMBINDEX, TROPONINI in the last 168 hours. BNP (last 3 results) No results for input(s): PROBNP in the last 8760 hours. HbA1C: No results for input(s): HGBA1C in the last 72 hours. CBG:  Recent Labs Lab 09/21/15 1714 09/21/15 2135 09/22/15 0811 09/22/15 1236  GLUCAP 149* 128* 214* 188*   Lipid Profile: No results for input(s): CHOL, HDL, LDLCALC, TRIG, CHOLHDL, LDLDIRECT  in the last 72 hours. Thyroid Function Tests: No results for input(s): TSH, T4TOTAL, FREET4, T3FREE, THYROIDAB in the last 72 hours. Anemia Panel: No results for input(s): VITAMINB12, FOLATE, FERRITIN, TIBC, IRON, RETICCTPCT in the last 72 hours. Sepsis Labs: No results for input(s): PROCALCITON, LATICACIDVEN in the last 168 hours.  No results found for this or any previous visit (from the past 240 hour(s)).       Radiology Studies: Dg Chest Port 1 View  09/21/2015  CLINICAL DATA:  Encephalopathy EXAM: PORTABLE CHEST 1 VIEW COMPARISON:  05/08/2015 FINDINGS: Normal mediastinum and cardiac silhouette. Normal pulmonary vasculature. No evidence of effusion, infiltrate, or pneumothorax. No acute bony abnormality. IMPRESSION: No acute cardiopulmonary process. Electronically Signed   By: Suzy Bouchard M.D.   On: 09/21/2015 15:59        Scheduled Meds: . dicyclomine  10 mg Oral BID  . insulin aspart  0-5 Units Subcutaneous QHS  . insulin aspart  0-9 Units Subcutaneous TID WC  . insulin glargine  8 Units Subcutaneous Daily  . lactulose  30 g Oral TID  . Ledipasvir-Sofosbuvir  1 tablet Oral Daily  . nadolol  20 mg Oral Daily  . [START ON 09/23/2015] ribavirin  200 mg Oral BID  . rifaximin  550 mg Oral BID  . SORAfenib  400 mg Oral Q12H  . tamsulosin  0.4 mg Oral Daily   Continuous Infusions:       Time spent: 30 minutes.     Hosie Poisson, MD [facility] Pager [phone]  If 7PM-7AM, please contact night-coverage [email or web address] Password [email or web address] 09/22/2015, 5:25 PM

## 2015-09-22 NOTE — Progress Notes (Signed)
Unable to insert straight catheter for in and out. Patient was able to void in toilet. Will continue to monitor. Bartholomew Crews, RN

## 2015-09-22 NOTE — Plan of Care (Signed)
Problem: Fluid Volume: Goal: Ability to maintain a balanced intake and output will improve Outcome: Progressing Monitoring intake and output. No urine output during the night. Bladder scan 425 ml. MD notified. In and out cath x 1. Will continue to monitor. Bartholomew Crews, RN

## 2015-09-23 DIAGNOSIS — K7200 Acute and subacute hepatic failure without coma: Secondary | ICD-10-CM | POA: Diagnosis not present

## 2015-09-23 LAB — AMMONIA: AMMONIA: 47 umol/L — AB (ref 9–35)

## 2015-09-23 LAB — URINE MICROSCOPIC-ADD ON

## 2015-09-23 LAB — URINALYSIS, ROUTINE W REFLEX MICROSCOPIC
GLUCOSE, UA: 100 mg/dL — AB
Ketones, ur: 15 mg/dL — AB
NITRITE: POSITIVE — AB
PROTEIN: NEGATIVE mg/dL
Specific Gravity, Urine: 1.033 — ABNORMAL HIGH (ref 1.005–1.030)
pH: 5 (ref 5.0–8.0)

## 2015-09-23 LAB — HEMOGLOBIN A1C
HEMOGLOBIN A1C: 5.5 % (ref 4.8–5.6)
MEAN PLASMA GLUCOSE: 111 mg/dL

## 2015-09-23 LAB — GLUCOSE, CAPILLARY
GLUCOSE-CAPILLARY: 242 mg/dL — AB (ref 65–99)
GLUCOSE-CAPILLARY: 311 mg/dL — AB (ref 65–99)

## 2015-09-23 NOTE — Care Management Note (Addendum)
Case Management Note  Patient Details  Name: Ethan Wade MRN: [id number] Date of Birth: 1950/05/11  Subjective/Objective:                 Patient from home, lives alone, in [place] with HE. Ammonia still elevated but trending down. Awaiting PT consult. Patient wants to go home.   Action/Plan:  DC to home with [facility] PT RN HHA and RW through [facility], referral made   Expected Discharge Date:                  Expected Discharge Plan:  [place]  In-House Referral:  Clinical Social Work  Discharge planning Services  CM Consult  Post Acute Care Choice:    Choice offered to:     DME Arranged:    DME Agency:     HH Arranged:    [place] Agency:     Status of Service:  In process, will continue to follow  If discussed at Long Length of Stay Meetings, dates discussed:    Additional Comments:  Ethan Collet, RN 09/23/2015, 11:32 AM

## 2015-09-23 NOTE — Evaluation (Signed)
Physical Therapy Evaluation Patient Details Name: Ethan Wade MRN: [id number] DOB: 1951-01-13 Today's Date: 09/23/2015   History of Present Illness  pt is a 65 y/o male with h/o cirrhosis, hepatocellular CA, DM, Hep C, admitted with AMS due to acute hepatic encephalopathy.  Clinical Impression  Pt admitted with/for AMS.  Pt currently limited functionally due to the problems listed below.  (see problems list.)  Pt will benefit from PT to maximize function and safety to be able to get home safely with available assist.     Follow Up Recommendations Home health PT;Supervision - Intermittent;Supervision for mobility/OOB    Equipment Recommendations  Rolling walker with 5" wheels    Recommendations for Other Services       Precautions / Restrictions Precautions Precautions: Fall      Mobility  Bed Mobility Overal bed mobility: Needs Assistance Bed Mobility: Supine to Sit     Supine to sit: Supervision        Transfers Overall transfer level: Needs assistance Equipment used: Rolling walker (2 wheeled) Transfers: Sit to/from [employer] Sit to Stand: Supervision   Squat pivot transfers: Min guard     General transfer comment: cues for hand placement and safety  Ambulation/Gait Ambulation/Gait assistance: Min guard Ambulation Distance (Feet): 450 Feet Assistive device: Rolling walker (2 wheeled) Gait Pattern/deviations: Step-through pattern Gait velocity: sometimes too fast for pt to control well Gait velocity interpretation: at or above normal speed for age/gender General Gait Details: RW used adequately if cued otherwise  pt stayed too far behind the RW  Stairs Stairs: Yes Stairs assistance: Supervision Stair Management: One rail Right;Two rails;Alternating pattern;Forwards;Step to pattern Number of Stairs: 10 General stair comments: steady with rail only  Wheelchair Mobility    Modified Rankin (Stroke Patients Only)       Balance  Overall balance assessment: Needs assistance Sitting-balance support: Feet supported Sitting balance-Leahy Scale: Fair     Standing balance support: No upper extremity supported;Bilateral upper extremity supported Standing balance-Leahy Scale: Fair                               Pertinent Vitals/Pain Pain Assessment: Faces Faces Pain Scale: No hurt    Home Living Family/patient expects to be discharged to:: Private residence Living Arrangements: Alone Available Help at Discharge: Family;Available PRN/intermittently Type of Home: House Home Access: Stairs to enter Entrance Stairs-Rails: Psychiatric nurse of Steps: 2 Home Layout: One level Home Equipment: Cane - single point;Bedside commode      Prior Function Level of Independence: Independent         Comments: pt states he drives sometimes but brother in room states he doesn't allow him to drive much and takes him for food and appointments     Hand Dominance   Dominant Hand: Right    Extremity/Trunk Assessment   Upper Extremity Assessment: Generalized weakness           Lower Extremity Assessment: Overall WFL for tasks assessed;Generalized weakness         Communication   Communication: No difficulties  Cognition Arousal/Alertness: Awake/alert Behavior During Therapy: Restless Overall Cognitive Status: Impaired/Different from baseline Area of Impairment: Following commands;Safety/judgement;Awareness;Problem solving;Memory     Memory: Decreased short-term memory Following Commands: Follows one step commands with increased time;Follows one step commands consistently Safety/Judgement: Decreased awareness of safety;Decreased awareness of deficits Awareness: Intellectual Problem Solving: Slow processing      General Comments General comments (skin integrity, edema,  etc.): Asking for a RW.  Needed frequent cues to teach pt to use RW safetly    Exercises         Assessment/Plan    PT Assessment Patient needs continued PT services  PT Diagnosis Abnormality of gait;Generalized weakness   PT Problem List Decreased strength;Decreased activity tolerance;Decreased knowledge of use of DME;Decreased safety awareness  PT Treatment Interventions DME instruction;Gait training;Functional mobility training;Therapeutic activities;Patient/family education   PT Goals (Current goals can be found in the Care Plan section) Acute Rehab PT Goals Patient Stated Goal: get back home, get me a RW PT Goal Formulation: With patient Time For Goal Achievement: 09/30/15 Potential to Achieve Goals: Good    Frequency Min 3X/week   Barriers to discharge        Co-evaluation               End of Session   Activity Tolerance: Patient tolerated treatment well Patient left: Other (comment) (sitting EOB)      Functional Assessment Tool Used: clinical judgement Functional Limitation: Mobility: Walking and moving around Mobility: Walking and Moving Around Current Status [phone]): At least 1 percent but less than 20 percent impaired, limited or restricted Mobility: Walking and Moving Around Goal Status [phone]): 0 percent impaired, limited or restricted    Time: 1231-1255 PT Time Calculation (min) (ACUTE ONLY): 24 min   Charges:   PT Evaluation $PT Eval Moderate Complexity: 1 Procedure PT Treatments $Gait Training: 8-22 mins   PT G Codes:   PT G-Codes **NOT FOR INPATIENT CLASS** Functional Assessment Tool Used: clinical judgement Functional Limitation: Mobility: Walking and moving around Mobility: Walking and Moving Around Current Status [id number]): At least 1 percent but less than 20 percent impaired, limited or restricted Mobility: Walking and Moving Around Goal Status [phone]): 0 percent impaired, limited or restricted    Ethan Wade, Ethan Wade 09/23/2015, 1:06 PM 09/23/2015  Ethan Wade, PT [phone]  (pager)

## 2015-09-23 NOTE — Progress Notes (Signed)
Discharge instructions, RX's and follow up appts explained and provided to patient. Patient left floor via wheelchair accompanied by staff no c/o pain or shortness of breath at d/c. Home medications returned from pharmacy.  Britany Callicott, Tivis Ringer, RN

## 2015-09-23 NOTE — Care Management Obs Status (Signed)
[place] NOTIFICATION   Patient Details  Name: Ethan Wade MRN: [id number] Date of Birth: 09/30/1950   Medicare Observation Status Notification Given:  Yes    Carles Collet, RN 09/23/2015, 11:31 AM

## 2015-09-25 NOTE — Discharge Summary (Signed)
Physician Discharge Summary  Ethan Wade [id number] DOB: 05/19/1950 DOA: 09/21/2015  PCP: Ethan Heck, MD  Admit date: 09/21/2015 Discharge date: 09/23/2015  Admitted From: home. Disposition: home   Recommendations for Outpatient Follow-up:  1. Follow up with PCP in 1-2 weeks 2. Please obtain BMP/CBC in one week 3. Please follow up on the following pending results:  Home Health:pt refusing.   Discharge Conditionguarded.  CODE STATUS:full. Diet recommendation: Heart Healthy / Carb Modified  Brief/Interim Summary: Ethan Wade is a 65 y.o. male with a Past Medical History of hep c being treated at [facility], hepatocellular ca on Nexavar, who presents with worsening acute encephalopathy.  Discharge Diagnoses:  Principal Problem:   Acute hepatic encephalopathy (HCC) Active Problems:   Thrombocytopenia ([place])   Chronic hepatitis C with cirrhosis (HCC)   HTN (hypertension)   Psoriasis   Chronic liver disease and cirrhosis (HCC)   Increased ammonia level   Hepatocellular carcinoma (HCC)   Controlled type 2 DM with peripheral circulatory disorder ([place])  Acute hepatic encephalopathy: Admit for lactulose three times a day.  Much improved after lactulose.  Resume rifaximin.  Advised to be compliant to medications.    Hepatitis C:  Resume the treatment.    Diabetes Mellitus: CBG (last 3)   Recent Labs (last 2 labs)      Recent Labs  09/22/15 0811 09/22/15 1236 09/22/15 1711  GLUCAP 214* 188* 247*      Resume home insulin on discharge.    Liver cirrhosis: Resume nadolol.        Discharge Instructions  Discharge Instructions    Diet - low sodium heart healthy    Complete by:  As directed      Discharge instructions    Complete by:  As directed   Please follow up with Primary Care Physician in one week.  You have to be compliant to your medications.  Please allow home health PT and RN and follow up with them.             Medication List    STOP taking these medications        ibuprofen 600 MG tablet  Commonly known as:  ADVIL,MOTRIN      TAKE these medications        CLEAR EYES FOR DRY EYES 1-0.25 % Soln  Generic drug:  Carboxymethylcellul-Glycerin  Apply 1-2 drops to eye daily as needed (for dry eyes). Reported on 05/20/2015     dicyclomine 10 MG capsule  Commonly known as:  BENTYL  Take 1 capsule (10 mg total) by mouth 2 (two) times daily.     furosemide 40 MG tablet  Commonly known as:  LASIX  Take 40 mg by mouth daily.     HARVONI 90-400 MG Tabs  Generic drug:  Ledipasvir-Sofosbuvir  Take 1 tablet by mouth daily.     insulin glargine 100 UNIT/ML injection  Commonly known as:  LANTUS  Inject 0.08 mLs (8 Units total) into the skin daily.     lactulose 10 GM/15ML solution  Commonly known as:  CHRONULAC  Take 45 mLs (30 g total) by mouth 3 (three) times daily.     nadolol 20 MG tablet  Commonly known as:  CORGARD  Take 20 mg by mouth daily.     oxyCODONE 5 MG immediate release tablet  Commonly known as:  Oxy IR/ROXICODONE  Take 1 tablet (5 mg total) by mouth every 12 (twelve) hours as needed for severe pain or breakthrough pain (only for severe  or breakthrough pain).     RIBASPHERE 200 MG tablet  Generic drug:  ribavirin  Take 200 mg by mouth 2 (two) times daily.     SORAfenib 200 MG tablet  Commonly known as:  NEXAVAR  Take 400 mg by mouth every 12 (twelve) hours. Take on an empty stomach 1 hour before or 2 hours after meals.     spironolactone 100 MG tablet  Commonly known as:  ALDACTONE  Take 100 mg by mouth daily.     tamsulosin 0.4 MG Caps capsule  Commonly known as:  FLOMAX  Take 0.4 mg by mouth daily.     XIFAXAN 550 MG Tabs tablet  Generic drug:  rifaximin  Take 550 mg by mouth 2 (two) times daily.     zolpidem 5 MG tablet  Commonly known as:  AMBIEN  Take 5 mg by mouth at bedtime.           Follow-up Information    Follow up with Ethan Heck, MD. Schedule an appointment as soon as possible for a visit in 1 week.   Specialty:  Family Medicine   Contact information:   [place] [address] [phone]       Follow up with [place].   Why:  DC to home with [facility] RN PT HHA. they will call in next 1-2 days to set up first home visit   Contact information:   [address] [place] [address] [phone]       Follow up with [place].   Why:  Rolling walker to be delivered to room prior to [employer] information:   [address] [place] [address] [phone]      No Known Allergies  Consultations:  none   Procedures/Studies: Dg Chest Port 1 View  09/21/2015  CLINICAL DATA:  Encephalopathy EXAM: PORTABLE CHEST 1 VIEW COMPARISON:  05/08/2015 FINDINGS: Normal mediastinum and cardiac silhouette. Normal pulmonary vasculature. No evidence of effusion, infiltrate, or pneumothorax. No acute bony abnormality. IMPRESSION: No acute cardiopulmonary process. Electronically Signed   By: Ethan Wade M.D.   On: 09/21/2015 15:59      Subjective: Ethan Wade about going home, refusing home health, brother at bedside, reports he is helpless.   Discharge Exam: Filed Vitals:   09/23/15 0601 09/23/15 1324  BP: 98/74 102/71  Pulse: 78 67  Temp: 98 F (36.7 C) 97.5 F (36.4 C)  Resp: 16 18   Filed Vitals:   09/22/15 1234 09/22/15 2118 09/23/15 0601 09/23/15 1324  BP: 105/69 116/76 98/74 102/71  Pulse: 81 73 78 67  Temp: 98 F (36.7 C) 98.2 F (36.8 C) 98 F (36.7 C) 97.5 F (36.4 C)  TempSrc: Oral Oral Oral Oral  Resp: 18 16 16 18   Height:      Weight:   59.829 kg (131 lb 14.4 oz)   SpO2: 100% 99% 99% 100%    General: Pt is alert, awake, not in acute distress Cardiovascular: RRR, S1/S2 +, no rubs, no gallops Respiratory: CTA bilaterally, no wheezing, no rhonchi Abdominal: Soft, NT, ND, bowel sounds + Extremities: no edema,  no cyanosis    The results of significant diagnostics from this hospitalization (including imaging, microbiology, ancillary and laboratory) are listed below for reference.     Microbiology: No results found for this or any previous visit (from the past 240 hour(s)).   Labs: BNP (last 3 results) No results for  input(s): BNP in the last 8760 hours. Basic Metabolic Panel:  Recent Labs Lab 09/21/15 1210 09/22/15 0448  NA 131* 134*  K 4.3 3.9  CL 96* 100*  CO2 23 25  GLUCOSE 157* 245*  BUN 20 21*  CREATININE 1.20 1.32*  CALCIUM 8.4* 8.3*   Liver Function Tests:  Recent Labs Lab 09/21/15 1210 09/22/15 0448  AST 52* 44*  ALT 27 25  ALKPHOS 77 69  BILITOT 5.6* 5.2*  PROT 6.1* 5.8*  ALBUMIN 2.3* 2.1*   No results for input(s): LIPASE, AMYLASE in the last 168 hours.  Recent Labs Lab 09/21/15 1212 09/22/15 0653 09/23/15 1327  AMMONIA 154* 135* 47*   CBC:  Recent Labs Lab 09/21/15 1210 09/22/15 0448  WBC 4.1 4.7  HGB 12.9* 12.2*  HCT 38.3* 38.2*  MCV 111.7* 114.4*  PLT 95* 83*   Cardiac Enzymes: No results for input(s): CKTOTAL, CKMB, CKMBINDEX, TROPONINI in the last 168 hours. BNP: Invalid input(s): POCBNP CBG:  Recent Labs Lab 09/22/15 1236 09/22/15 1711 09/22/15 2117 09/23/15 0807 09/23/15 1230  GLUCAP 188* 247* 261* 242* 311*   D-Dimer No results for input(s): DDIMER in the last 72 hours. Hgb A1c No results for input(s): HGBA1C in the last 72 hours. Lipid Profile No results for input(s): CHOL, HDL, LDLCALC, TRIG, CHOLHDL, LDLDIRECT in the last 72 hours. Thyroid function studies No results for input(s): TSH, T4TOTAL, T3FREE, THYROIDAB in the last 72 hours.  Invalid input(s): FREET3 Anemia work up No results for input(s): VITAMINB12, FOLATE, FERRITIN, TIBC, IRON, RETICCTPCT in the last 72 hours. Urinalysis    Component Value Date/Time   COLORURINE ORANGE* 09/23/2015 0926   APPEARANCEUR CLEAR 09/23/2015 0926   LABSPEC 1.033*  09/23/2015 0926   PHURINE 5.0 09/23/2015 0926   GLUCOSEU 100* 09/23/2015 0926   HGBUR MODERATE* 09/23/2015 0926   BILIRUBINUR SMALL* 09/23/2015 0926   KETONESUR 15* 09/23/2015 0926   PROTEINUR NEGATIVE 09/23/2015 0926   UROBILINOGEN 2.0* 08/17/2014 0421   NITRITE POSITIVE* 09/23/2015 0926   LEUKOCYTESUR SMALL* 09/23/2015 0926   Sepsis Labs Invalid input(s): PROCALCITONIN,  WBC,  LACTICIDVEN Microbiology No results found for this or any previous visit (from the past 240 hour(s)).   Time coordinating discharge: Over 30 minutes  SIGNED:   Hosie Poisson, MD  [facility] 09/25/2015, 8:37 AM Pager [phone]  If 7PM-7AM, please contact night-coverage [email or web address] Password [email or web address]

## 2015-09-27 ENCOUNTER — Emergency Department (HOSPITAL_COMMUNITY): Payer: Medicare Other

## 2015-09-27 ENCOUNTER — Encounter (HOSPITAL_COMMUNITY): Payer: Self-pay

## 2015-09-27 ENCOUNTER — Inpatient Hospital Stay (HOSPITAL_COMMUNITY)
Admission: EM | Admit: 2015-09-27 | Discharge: 2015-09-30 | DRG: 441 | Disposition: A | Discharge status: Home Health | Payer: Medicare Other | Attending: Internal Medicine | Admitting: Internal Medicine

## 2015-09-27 DIAGNOSIS — E875 Hyperkalemia: Secondary | ICD-10-CM | POA: Diagnosis present

## 2015-09-27 DIAGNOSIS — K7200 Acute and subacute hepatic failure without coma: Secondary | ICD-10-CM | POA: Diagnosis present

## 2015-09-27 DIAGNOSIS — K7460 Unspecified cirrhosis of liver: Secondary | ICD-10-CM | POA: Diagnosis present

## 2015-09-27 DIAGNOSIS — R4182 Altered mental status, unspecified: Secondary | ICD-10-CM | POA: Diagnosis not present

## 2015-09-27 DIAGNOSIS — Z79899 Other long term (current) drug therapy: Secondary | ICD-10-CM

## 2015-09-27 DIAGNOSIS — E871 Hypo-osmolality and hyponatremia: Secondary | ICD-10-CM | POA: Diagnosis present

## 2015-09-27 DIAGNOSIS — K7682 Hepatic encephalopathy: Secondary | ICD-10-CM | POA: Diagnosis present

## 2015-09-27 DIAGNOSIS — D689 Coagulation defect, unspecified: Secondary | ICD-10-CM | POA: Diagnosis present

## 2015-09-27 DIAGNOSIS — Z87891 Personal history of nicotine dependence: Secondary | ICD-10-CM

## 2015-09-27 DIAGNOSIS — Z794 Long term (current) use of insulin: Secondary | ICD-10-CM

## 2015-09-27 DIAGNOSIS — L409 Psoriasis, unspecified: Secondary | ICD-10-CM | POA: Diagnosis present

## 2015-09-27 DIAGNOSIS — R410 Disorientation, unspecified: Secondary | ICD-10-CM

## 2015-09-27 DIAGNOSIS — B182 Chronic viral hepatitis C: Secondary | ICD-10-CM

## 2015-09-27 DIAGNOSIS — K7469 Other cirrhosis of liver: Secondary | ICD-10-CM

## 2015-09-27 DIAGNOSIS — B1920 Unspecified viral hepatitis C without hepatic coma: Secondary | ICD-10-CM | POA: Diagnosis present

## 2015-09-27 DIAGNOSIS — E1151 Type 2 diabetes mellitus with diabetic peripheral angiopathy without gangrene: Secondary | ICD-10-CM | POA: Diagnosis not present

## 2015-09-27 DIAGNOSIS — N400 Enlarged prostate without lower urinary tract symptoms: Secondary | ICD-10-CM | POA: Diagnosis present

## 2015-09-27 DIAGNOSIS — E43 Unspecified severe protein-calorie malnutrition: Secondary | ICD-10-CM | POA: Diagnosis present

## 2015-09-27 DIAGNOSIS — K7290 Hepatic failure, unspecified without coma: Secondary | ICD-10-CM

## 2015-09-27 DIAGNOSIS — N179 Acute kidney failure, unspecified: Secondary | ICD-10-CM | POA: Diagnosis present

## 2015-09-27 DIAGNOSIS — I959 Hypotension, unspecified: Secondary | ICD-10-CM | POA: Diagnosis present

## 2015-09-27 DIAGNOSIS — E860 Dehydration: Secondary | ICD-10-CM

## 2015-09-27 DIAGNOSIS — Z825 Family history of asthma and other chronic lower respiratory diseases: Secondary | ICD-10-CM

## 2015-09-27 DIAGNOSIS — Z800 Family history of malignant neoplasm of digestive organs: Secondary | ICD-10-CM

## 2015-09-27 DIAGNOSIS — I341 Nonrheumatic mitral (valve) prolapse: Secondary | ICD-10-CM | POA: Diagnosis present

## 2015-09-27 DIAGNOSIS — K429 Umbilical hernia without obstruction or gangrene: Secondary | ICD-10-CM | POA: Diagnosis present

## 2015-09-27 DIAGNOSIS — I10 Essential (primary) hypertension: Secondary | ICD-10-CM | POA: Diagnosis present

## 2015-09-27 DIAGNOSIS — D696 Thrombocytopenia, unspecified: Secondary | ICD-10-CM | POA: Diagnosis present

## 2015-09-27 DIAGNOSIS — C220 Liver cell carcinoma: Secondary | ICD-10-CM | POA: Diagnosis present

## 2015-09-27 DIAGNOSIS — Z681 Body mass index (BMI) 19 or less, adult: Secondary | ICD-10-CM

## 2015-09-27 DIAGNOSIS — Z9114 Patient's other noncompliance with medication regimen: Secondary | ICD-10-CM

## 2015-09-27 DIAGNOSIS — Z833 Family history of diabetes mellitus: Secondary | ICD-10-CM

## 2015-09-27 DIAGNOSIS — Z8042 Family history of malignant neoplasm of prostate: Secondary | ICD-10-CM

## 2015-09-27 DIAGNOSIS — R278 Other lack of coordination: Secondary | ICD-10-CM | POA: Diagnosis present

## 2015-09-27 DIAGNOSIS — Z8249 Family history of ischemic heart disease and other diseases of the circulatory system: Secondary | ICD-10-CM

## 2015-09-27 LAB — COMPREHENSIVE METABOLIC PANEL
ALT: 33 U/L (ref 17–63)
ANION GAP: 13 (ref 5–15)
AST: 57 U/L — ABNORMAL HIGH (ref 15–41)
Albumin: 2.5 g/dL — ABNORMAL LOW (ref 3.5–5.0)
Alkaline Phosphatase: 76 U/L (ref 38–126)
BUN: 30 mg/dL — ABNORMAL HIGH (ref 6–20)
CHLORIDE: 104 mmol/L (ref 101–111)
CO2: 21 mmol/L — ABNORMAL LOW (ref 22–32)
CREATININE: 1.11 mg/dL (ref 0.61–1.24)
Calcium: 8.7 mg/dL — ABNORMAL LOW (ref 8.9–10.3)
Glucose, Bld: 126 mg/dL — ABNORMAL HIGH (ref 65–99)
POTASSIUM: 5.3 mmol/L — AB (ref 3.5–5.1)
Sodium: 138 mmol/L (ref 135–145)
Total Bilirubin: 6.7 mg/dL — ABNORMAL HIGH (ref 0.3–1.2)
Total Protein: 6.5 g/dL (ref 6.5–8.1)

## 2015-09-27 LAB — CBC WITH DIFFERENTIAL/PLATELET
BASOS PCT: 0 %
Basophils Absolute: 0 10*3/uL (ref 0.0–0.1)
EOS ABS: 0.1 10*3/uL (ref 0.0–0.7)
Eosinophils Relative: 1 %
HCT: 39 % (ref 39.0–52.0)
Hemoglobin: 13.2 g/dL (ref 13.0–17.0)
LYMPHS ABS: 0.5 10*3/uL — AB (ref 0.7–4.0)
LYMPHS PCT: 10 %
MCH: 39.2 pg — AB (ref 26.0–34.0)
MCHC: 33.8 g/dL (ref 30.0–36.0)
MCV: 115.7 fL — AB (ref 78.0–100.0)
MONO ABS: 0.5 10*3/uL (ref 0.1–1.0)
Monocytes Relative: 9 %
NEUTROS ABS: 4.1 10*3/uL (ref 1.7–7.7)
Neutrophils Relative %: 80 %
PLATELETS: 90 10*3/uL — AB (ref 150–400)
RBC: 3.37 MIL/uL — ABNORMAL LOW (ref 4.22–5.81)
RDW: 15.3 % (ref 11.5–15.5)
WBC: 5.2 10*3/uL (ref 4.0–10.5)

## 2015-09-27 LAB — GLUCOSE, CAPILLARY: Glucose-Capillary: 187 mg/dL — ABNORMAL HIGH (ref 65–99)

## 2015-09-27 LAB — AMMONIA: AMMONIA: 177 umol/L — AB (ref 9–35)

## 2015-09-27 LAB — POC OCCULT BLOOD, ED: Fecal Occult Bld: NEGATIVE

## 2015-09-27 LAB — PROTIME-INR
INR: 1.59 — AB (ref 0.00–1.49)
PROTHROMBIN TIME: 18.5 s — AB (ref 11.6–15.2)

## 2015-09-27 MED ORDER — ACETAMINOPHEN 650 MG RE SUPP: 650.0000 mg | Freq: Four times a day (QID) | RECTAL | Status: DC | PRN

## 2015-09-27 MED ORDER — ENSURE ENLIVE PO LIQD
237.0000 mL | Freq: Two times a day (BID) | ORAL | Status: DC
Start: 2015-09-28 — End: 2015-09-30
  Administered 2015-09-28: 237 mL via ORAL

## 2015-09-27 MED ORDER — RIBAVIRIN 200 MG PO TABS
200.0000 mg | ORAL_TABLET | Freq: Two times a day (BID) | ORAL | Status: DC
  Administered 2015-09-28 – 2015-09-29 (×2): 200 mg via ORAL

## 2015-09-27 MED ORDER — ACETAMINOPHEN 325 MG PO TABS: 650.0000 mg | ORAL_TABLET | Freq: Four times a day (QID) | ORAL | Status: DC | PRN

## 2015-09-27 MED ORDER — SODIUM CHLORIDE 0.9 % IV SOLN
INTRAVENOUS | Status: DC
Start: 2015-09-27 — End: 2015-09-30
  Administered 2015-09-27: 20:00:00 via INTRAVENOUS

## 2015-09-27 MED ORDER — ONDANSETRON HCL 4 MG/2ML IJ SOLN: 4.0000 mg | Freq: Four times a day (QID) | INTRAMUSCULAR | Status: DC | PRN

## 2015-09-27 MED ORDER — LACTULOSE 10 GM/15ML PO SOLN
30.0000 g | Freq: Three times a day (TID) | ORAL | Status: DC
  Administered 2015-09-27 – 2015-09-28 (×2): 30 g via ORAL
  Filled 2015-09-27 (×2): qty 45

## 2015-09-27 MED ORDER — RIFAXIMIN 550 MG PO TABS
550.0000 mg | ORAL_TABLET | Freq: Two times a day (BID) | ORAL | Status: DC
  Administered 2015-09-27 – 2015-09-30 (×6): 550 mg via ORAL
  Filled 2015-09-27 (×6): qty 1

## 2015-09-27 MED ORDER — TAMSULOSIN HCL 0.4 MG PO CAPS
0.4000 mg | ORAL_CAPSULE | Freq: Every day | ORAL | Status: DC
  Administered 2015-09-27 – 2015-09-30 (×4): 0.4 mg via ORAL
  Filled 2015-09-27 (×4): qty 1

## 2015-09-27 MED ORDER — SORAFENIB TOSYLATE 200 MG PO TABS
400.0000 mg | ORAL_TABLET | Freq: Two times a day (BID) | ORAL | Status: DC
  Administered 2015-09-27 – 2015-09-30 (×6): 400 mg via ORAL

## 2015-09-27 MED ORDER — ONDANSETRON HCL 4 MG PO TABS: 4.0000 mg | ORAL_TABLET | Freq: Four times a day (QID) | ORAL | Status: DC | PRN

## 2015-09-27 MED ORDER — LEDIPASVIR-SOFOSBUVIR 90-400 MG PO TABS
1.0000 | ORAL_TABLET | Freq: Every day | ORAL | Status: DC
  Administered 2015-09-27 – 2015-09-30 (×4): 1 via ORAL
  Filled 2015-09-27: qty 1

## 2015-09-27 MED ORDER — NADOLOL 20 MG PO TABS
20.0000 mg | ORAL_TABLET | Freq: Every day | ORAL | Status: DC
  Administered 2015-09-27 – 2015-09-28 (×2): 20 mg via ORAL
  Filled 2015-09-27 (×3): qty 1

## 2015-09-27 MED ORDER — LACTULOSE 10 GM/15ML PO SOLN
30.0000 g | Freq: Once | ORAL | Status: AC
  Administered 2015-09-27: 30 g via ORAL
  Filled 2015-09-27: qty 45

## 2015-09-27 MED ORDER — INSULIN ASPART 100 UNIT/ML ~~LOC~~ SOLN
0.0000 [IU] | Freq: Every day | SUBCUTANEOUS | Status: DC
  Administered 2015-09-28: 5 [IU] via SUBCUTANEOUS
  Administered 2015-09-29: 3 [IU] via SUBCUTANEOUS

## 2015-09-27 MED ORDER — BOOST / RESOURCE BREEZE PO LIQD
1.0000 | Freq: Three times a day (TID) | ORAL | Status: DC
  Administered 2015-09-27 – 2015-09-28 (×3): 1 via ORAL

## 2015-09-27 MED ORDER — DICYCLOMINE HCL 10 MG PO CAPS
10.0000 mg | ORAL_CAPSULE | Freq: Two times a day (BID) | ORAL | Status: DC
  Administered 2015-09-27 – 2015-09-30 (×6): 10 mg via ORAL
  Filled 2015-09-27 (×6): qty 1

## 2015-09-27 MED ORDER — HYPROMELLOSE (GONIOSCOPIC) 2.5 % OP SOLN: 1.0000 [drp] | Freq: Every day | OPHTHALMIC | Status: DC | PRN

## 2015-09-27 MED ORDER — INSULIN ASPART 100 UNIT/ML ~~LOC~~ SOLN
0.0000 [IU] | Freq: Three times a day (TID) | SUBCUTANEOUS | Status: DC
  Administered 2015-09-28 (×2): 3 [IU] via SUBCUTANEOUS
  Administered 2015-09-28: 9 [IU] via SUBCUTANEOUS
  Administered 2015-09-29: 2 [IU] via SUBCUTANEOUS
  Administered 2015-09-29 (×2): 5 [IU] via SUBCUTANEOUS
  Administered 2015-09-30: 2 [IU] via SUBCUTANEOUS
  Administered 2015-09-30: 5 [IU] via SUBCUTANEOUS

## 2015-09-27 MED ORDER — FUROSEMIDE 40 MG PO TABS
40.0000 mg | ORAL_TABLET | Freq: Every day | ORAL | Status: DC
  Administered 2015-09-27 – 2015-09-28 (×2): 40 mg via ORAL
  Filled 2015-09-27 (×2): qty 1

## 2015-09-27 NOTE — Progress Notes (Signed)
PATIENT HAS MEDS FROM HOME THAT NEED TO BE TAKEN TO THE PHARMACY

## 2015-09-27 NOTE — ED Notes (Signed)
Per EMS- patient has a history of liver cancer and elevated ammonia levels. Patient's brother called and reported that the patient has altered mental status and gets this way when his ammonia level is elevated. Patient is jaundice and has only been saying "yeah" when questions asked.

## 2015-09-27 NOTE — ED Notes (Signed)
Attempted to cll report to receiving RN. Receiving RN to call back for report.

## 2015-09-27 NOTE — H&P (Signed)
TRH H&P   Patient Demographics:    Ethan Wade, is a 65 y.o. male  MRN: [id number]   DOB - Aug 13, 1950  Admit Date - 09/27/2015  Outpatient Primary MD for the patient is Gerrit Heck, MD  Referring MD: Dr. Oleta Mouse  Outpatient Specialists:  GI (Dr. Amedeo Plenty)    Patient coming from: Home  Chief Complaint  Patient presents with  . Altered Mental Status      HPI:    Ethan Wade  is a 65 y.o. male, Jacian Wade is a 65 y.o. male with a history of diabetes, hepatitis C, liver cirrhosis, liver cancer brought to the emergency department with acute confusion. Patient's brother and daughter at bedside provided history. He has had multiple similar admission for hepatic encephalopathy. Patient was last seen normal yesterday evening and was recently discharged from the hospital earlier this week after being admitted for hepatic encephalopathy  patient was not answering his phone this morning and this was a signal to his brother that he was encephalopathic and not taking his medication. No fever, nausea, vomiting, abdominal pain, bowel or urinary symptoms. Patient awake and alert but extremely confused and does not even follow simple commands. Family inform that he was recommended to have a home health nurse commented check on him but patient was consistently refusing this in the past.  Course in the ED Blood pressure was low normal. Patient was extremely confused and poorly communicating, answering just with a yes. Blood work showed normal WBC and hemoglobin, platelets of 90 (chronic). Chemistry showed potassium of 5.3, BUN of 30 and INR of 1.59. Glucose of 126. Chest x-ray and head CT were unremarkable for acute findings. Patient given 30 g oral lactulose and hospitalist admission requested.     Review of systems:    As outlined in history of present illness. 10 point  review of systems unable to obtain due to patient's encephalopathy.   With Past History of the following :    Past Medical History  Diagnosis Date  . Macrocytosis   . Thrombocytopenia ([place])   . Esophageal bleed, non-variceal 2006  . HTN (hypertension)   . Psoriasis   . Mitral valve prolapse     OCCAS FLUTTER FEELING  . Vitamin D deficiency   . Umbilical hernia     OCCAS DISCOMFORT  . Prostate enlargement     PT TOLD "NORMAL FOR AGE" - TAKES FINASTERIDE AND FLOMAX  . Heart murmur   . Type II diabetes mellitus ([place])   . Rheumatic fever 1950's  . Hepatitis C 1980's  . Arthritis     "joint stiffness in the knees" (11/10/2013)  . Liver cancer ([place])   . Ascites   . Hepatic encephalopathy ([place]) 05/2015      Past Surgical History  Procedure Laterality Date  . Tonsilectomy, adenoidectomy, bilateral myringotomy and tubes  child  . Umbilical  hernia repair  03/09/2012    Procedure: HERNIA REPAIR UMBILICAL ADULT;  Surgeon: Edward Jolly, MD;  Location: [facility] ORS;  Service: General;  Laterality: N/A;  Repair Umbilical Hernia with Mesh  . Insertion of mesh  03/09/2012    Procedure: INSERTION OF MESH;  Surgeon: Edward Jolly, MD;  Location: [facility] ORS;  Service: General;  Laterality: N/A;  . Tonsillectomy    . Hernia repair        Social History:     Social History  Substance Use Topics  . Smoking status: Former Smoker -- 20 years    Types: Cigars    Start date: 12/22/2005  . Smokeless tobacco: Former Systems developer    Types: Snuff     Comment: "quit smoking ~ 2009; quit snuff in ~ 2013"  . Alcohol Use: Yes     Comment: "quit in late April 2015"     Lives -   Mobility -      Family History :     Family History  Problem Relation Age of Onset  . Prostate cancer Brother   . COPD Mother   . Heart disease Father   . Diabetes Father   . Alcohol abuse Father   . Liver cancer Brother   . Colon cancer Brother   . COPD Maternal Grandfather       Home Medications:    Prior to Admission medications   Medication Sig Start Date End Date Taking? Authorizing Provider  Carboxymethylcellul-Glycerin (CLEAR EYES FOR DRY EYES) 1-0.25 % SOLN Apply 1-2 drops to eye daily as needed (for dry eyes). Reported on 05/20/2015   Yes Historical Provider, MD  dicyclomine (BENTYL) 10 MG capsule Take 1 capsule (10 mg total) by mouth 2 (two) times daily. 11/13/13  Yes Ripudeep Krystal Eaton, MD  furosemide (LASIX) 40 MG tablet Take 40 mg by mouth daily.   Yes Historical Provider, MD  HARVONI 90-400 MG TABS Take 1 tablet by mouth daily. 08/19/15  Yes Historical Provider, MD  insulin glargine (LANTUS) 100 UNIT/ML injection Inject 0.08 mLs (8 Units total) into the skin daily. 04/04/14  Yes Shanker Kristeen Mans, MD  lactulose (CHRONULAC) 10 GM/15ML solution Take 45 mLs (30 g total) by mouth 3 (three) times daily. 02/25/15  Yes Reyne Dumas, MD  nadolol (CORGARD) 20 MG tablet Take 20 mg by mouth daily.   Yes Historical Provider, MD  oxyCODONE (OXY IR/ROXICODONE) 5 MG immediate release tablet Take 1 tablet (5 mg total) by mouth every 12 (twelve) hours as needed for severe pain or breakthrough pain (only for severe or breakthrough pain). 11/13/13  Yes Ripudeep K Rai, MD  RIBASPHERE 200 MG tablet Take 200 mg by mouth 2 (two) times daily.  08/19/15  Yes Historical Provider, MD  SORAfenib (NEXAVAR) 200 MG tablet Take 400 mg by mouth every 12 (twelve) hours. Take on an empty stomach 1 hour before or 2 hours after meals.   Yes Historical Provider, MD  spironolactone (ALDACTONE) 100 MG tablet Take 100 mg by mouth daily.   Yes Historical Provider, MD  Tamsulosin HCl (FLOMAX) 0.4 MG CAPS Take 0.4 mg by mouth daily.   Yes Historical Provider, MD  XIFAXAN 550 MG TABS tablet Take 550 mg by mouth 2 (two) times daily. 05/17/15  Yes Historical Provider, MD  zolpidem (AMBIEN) 5 MG tablet Take 5 mg by mouth at bedtime. 05/30/15  Yes Historical Provider, MD     Allergies:    No Known Allergies   Physical Exam:  Vitals  Blood pressure 101/75, pulse 77, temperature 98.1 F (36.7 C), resp. rate 16, SpO2 94 %.   Gen.: Elderly thin built male lying in bed not in distress HEENT: No pallor, icteric, dry mucosa, supple neck Chest: Clear to auscultation bilaterally CVS: Normal S1 and S2, no murmurs rub or gallop GI: Soft, nondistended, nontender, bowel sounds present  Musculoskeletal: Warm, no edema, chronic psoriatic changes in legs CNS: Alert and awake, not oriented, unable to perform neurological exam   Data Review:    CBC  Recent Labs Lab 09/21/15 1210 09/22/15 0448 09/27/15 1423  WBC 4.1 4.7 5.2  HGB 12.9* 12.2* 13.2  HCT 38.3* 38.2* 39.0  PLT 95* 83* 90*  MCV 111.7* 114.4* 115.7*  MCH 37.6* 36.5* 39.2*  MCHC 33.7 31.9 33.8  RDW 15.1 15.0 15.3  LYMPHSABS  --   --  0.5*  MONOABS  --   --  0.5  EOSABS  --   --  0.1  BASOSABS  --   --  0.0   ------------------------------------------------------------------------------------------------------------------  Chemistries   Recent Labs Lab 09/21/15 1210 09/22/15 0448 09/27/15 1423  NA 131* 134* 138  K 4.3 3.9 5.3*  CL 96* 100* 104  CO2 23 25 21*  GLUCOSE 157* 245* 126*  BUN 20 21* 30*  CREATININE 1.20 1.32* 1.11  CALCIUM 8.4* 8.3* 8.7*  AST 52* 44* 57*  ALT 27 25 33  ALKPHOS 77 69 76  BILITOT 5.6* 5.2* 6.7*   ------------------------------------------------------------------------------------------------------------------ estimated creatinine clearance is 55.4 mL/min (by C-G formula based on Cr of 1.11). ------------------------------------------------------------------------------------------------------------------ No results for input(s): TSH, T4TOTAL, T3FREE, THYROIDAB in the last 72 hours.  Invalid input(s): FREET3  Coagulation profile  Recent Labs Lab 09/27/15 1423  INR 1.59*   ------------------------------------------------------------------------------------------------------------------- No results  for input(s): DDIMER in the last 72 hours. -------------------------------------------------------------------------------------------------------------------  Cardiac Enzymes No results for input(s): CKMB, TROPONINI, MYOGLOBIN in the last 168 hours.  Invalid input(s): CK ------------------------------------------------------------------------------------------------------------------ No results found for: BNP   ---------------------------------------------------------------------------------------------------------------  Urinalysis    Component Value Date/Time   COLORURINE ORANGE* 09/23/2015 0926   APPEARANCEUR CLEAR 09/23/2015 0926   LABSPEC 1.033* 09/23/2015 0926   PHURINE 5.0 09/23/2015 0926   GLUCOSEU 100* 09/23/2015 0926   HGBUR MODERATE* 09/23/2015 0926   BILIRUBINUR SMALL* 09/23/2015 0926   KETONESUR 15* 09/23/2015 0926   PROTEINUR NEGATIVE 09/23/2015 0926   UROBILINOGEN 2.0* 08/17/2014 0421   NITRITE POSITIVE* 09/23/2015 0926   LEUKOCYTESUR SMALL* 09/23/2015 0926    ----------------------------------------------------------------------------------------------------------------   Imaging Results:    Dg Chest 2 View  09/27/2015  CLINICAL DATA:  Acute mental status change. History of liver carcinoma. EXAM: CHEST  2 VIEW COMPARISON:  September 21, 2015 FINDINGS: Mildly tortuous thoracic aorta. The heart, hila, mediastinum, lungs, and pleura are unchanged with no acute abnormality. IMPRESSION: No active cardiopulmonary disease. Electronically Signed   By: Dorise Bullion III M.D   On: 09/27/2015 14:48   Ct Head Wo Contrast  09/27/2015  CLINICAL DATA:  Altered mental status. EXAM: CT HEAD WITHOUT CONTRAST TECHNIQUE: Contiguous axial images were obtained from the base of the skull through the vertex without intravenous contrast. COMPARISON:  CT scan of April 01, 2014. FINDINGS: Bony calvarium appears intact. Mild diffuse cortical atrophy is noted. Mild chronic ischemic white  matter disease is noted. No mass effect or midline shift is noted. Ventricular size is within normal limits. There is no evidence of mass lesion, hemorrhage or acute infarction. IMPRESSION: Mild diffuse cortical atrophy. Mild chronic ischemic white matter  disease. No acute intracranial abnormality seen. Electronically Signed   By: Marijo Conception, M.D.   On: 09/27/2015 14:54    My personal review of EKG: Normal sinus rhythm at 76, no ST-T changes   Assessment & Plan:    Principal Problem:   Acute hepatic encephalopathy (HCC) Likely secondary to medication nonadherence. Was recently discharged from the hospital. Given a dose of lactulose 30 g in the ED. Admit to MedSurg. Continue neuro checks. We'll place him on his home lactulose dose titrating to at least 3 loose bowel movements daily. Gentle IV hydration. Continue rifaximin. Monitor daily lites in the morning. Family insist that patient will need home health RN upon discharge him and if he resists.  Active Problems:    Chronic hepatitis C with cirrhosis (HCC) Decompensated with thrombocytopenia, mild coagulopathy and recurrent hepatic encephalopathy. Resume Lasix. Hold Aldactone given hyperkalemia.       Mild dehydration Gentle IV fluids     Controlled type 2 DM with peripheral circulatory disorder ([place]) On Lantus at home. Will hold until he is eating well. Monitor on sliding scale coverage    Hyperkalemia No EKG changes. Hold Aldactone. Hopefully should improved once having bowel movements with lactulose. Recheck in a.m.  Protein calorie malnutrition Nutrition consult      DVT Prophylaxis:  SCDs   AM Labs Ordered, also please review Full Orders  Family Communication: Discussed with patient's brother and daughter at bedside  Code Status full code  Likely DC to  home with home health RN  Condition GUARDED    Consults called: None   Admission status: Observation   Time spent in minutes : 50   Louellen Molder  M.D on 09/27/2015 at 4:56 PM  Between 7am to 7pm - Pager - [phone]. After 7pm go to [email or web address] - password [facility]  [facility] - Office  [phone]

## 2015-09-27 NOTE — ED Provider Notes (Signed)
CSN: [id number]     Arrival date & time 09/27/15  1324 History   First MD Initiated Contact with Patient 09/27/15 1350     Chief Complaint  Patient presents with  . Altered Mental Status     (Consider location/radiation/quality/duration/timing/severity/associated sxs/prior Treatment) HPI  Level V caveat due to altered mental status. 65 -year-old male who presents with altered mental status. He has a history of hepatitis C cirrhosis with hepatocellular carcinoma complicated by esophageal varices, ascites, and hepatic encephalopathy. I he was just discharged from the hospital on June 26 for hepatic encephalopathy. Patient lives at home alone, but his brother lives less than a mile away from him. History is provided by his brother who states that he initially was doing well. His daughter talked to him on the phone last night and he was otherwise in his usual state of health. Today he was unable to be reached by phone, and his brother found him at home standing next to the toilet. He will only say yes to questioning in conversation, which is consistent with prior history of encephalopathy. They states that he has not had any complaints recently. Past Medical History  Diagnosis Date  . Macrocytosis   . Thrombocytopenia ([place])   . Esophageal bleed, non-variceal 2006  . HTN (hypertension)   . Psoriasis   . Mitral valve prolapse     OCCAS FLUTTER FEELING  . Vitamin D deficiency   . Umbilical hernia     OCCAS DISCOMFORT  . Prostate enlargement     PT TOLD "NORMAL FOR AGE" - TAKES FINASTERIDE AND FLOMAX  . Heart murmur   . Type II diabetes mellitus ([place])   . Rheumatic fever 1950's  . Hepatitis C 1980's  . Arthritis     "joint stiffness in the knees" (11/10/2013)  . Liver cancer ([place])   . Ascites   . Hepatic encephalopathy ([place]) 05/2015   Past Surgical History  Procedure Laterality Date  . Tonsilectomy, adenoidectomy, bilateral myringotomy and tubes  child  . Umbilical hernia repair   03/09/2012    Procedure: HERNIA REPAIR UMBILICAL ADULT;  Surgeon: Edward Jolly, MD;  Location: [facility];  Service: General;  Laterality: N/A;  Repair Umbilical Hernia with Mesh  . Insertion of mesh  03/09/2012    Procedure: INSERTION OF MESH;  Surgeon: Edward Jolly, MD;  Location: [facility];  Service: General;  Laterality: N/A;  . Tonsillectomy    . Hernia repair     Family History  Problem Relation Age of Onset  . Prostate cancer Brother   . COPD Mother   . Heart disease Father   . Diabetes Father   . Alcohol abuse Father   . Liver cancer Brother   . Colon cancer Brother   . COPD Maternal Grandfather    Social History  Substance Use Topics  . Smoking status: Former Smoker -- 20 years    Types: Cigars    Start date: 12/22/2005  . Smokeless tobacco: Former Systems developer    Types: Snuff     Comment: "quit smoking ~ 2009; quit snuff in ~ 2013"  . Alcohol Use: Yes     Comment: "quit in late April 2015"    Review of Systems Unable to be obtained due to mental status change   Allergies  Review of patient's allergies indicates no known allergies.  Home Medications   Prior to Admission medications   Medication Sig Start Date End Date Taking? Authorizing Provider  Carboxymethylcellul-Glycerin (CLEAR EYES FOR DRY  EYES) 1-0.25 % SOLN Apply 1-2 drops to eye daily as needed (for dry eyes). Reported on 05/20/2015   Yes Historical Provider, MD  dicyclomine (BENTYL) 10 MG capsule Take 1 capsule (10 mg total) by mouth 2 (two) times daily. 11/13/13  Yes Ripudeep Krystal Eaton, MD  furosemide (LASIX) 40 MG tablet Take 40 mg by mouth daily.   Yes Historical Provider, MD  HARVONI 90-400 MG TABS Take 1 tablet by mouth daily. 08/19/15  Yes Historical Provider, MD  insulin glargine (LANTUS) 100 UNIT/ML injection Inject 0.08 mLs (8 Units total) into the skin daily. 04/04/14  Yes Shanker Kristeen Mans, MD  lactulose (CHRONULAC) 10 GM/15ML solution Take 45 mLs (30 g total) by mouth 3 (three) times daily. 02/25/15   Yes Reyne Dumas, MD  nadolol (CORGARD) 20 MG tablet Take 20 mg by mouth daily.   Yes Historical Provider, MD  oxyCODONE (OXY IR/ROXICODONE) 5 MG immediate release tablet Take 1 tablet (5 mg total) by mouth every 12 (twelve) hours as needed for severe pain or breakthrough pain (only for severe or breakthrough pain). 11/13/13  Yes Ripudeep K Rai, MD  RIBASPHERE 200 MG tablet Take 200 mg by mouth 2 (two) times daily.  08/19/15  Yes Historical Provider, MD  SORAfenib (NEXAVAR) 200 MG tablet Take 400 mg by mouth every 12 (twelve) hours. Take on an empty stomach 1 hour before or 2 hours after meals.   Yes Historical Provider, MD  spironolactone (ALDACTONE) 100 MG tablet Take 100 mg by mouth daily.   Yes Historical Provider, MD  Tamsulosin HCl (FLOMAX) 0.4 MG CAPS Take 0.4 mg by mouth daily.   Yes Historical Provider, MD  XIFAXAN 550 MG TABS tablet Take 550 mg by mouth 2 (two) times daily. 05/17/15  Yes Historical Provider, MD  zolpidem (AMBIEN) 5 MG tablet Take 5 mg by mouth at bedtime. 05/30/15  Yes Historical Provider, MD   BP 96/75 mmHg  Pulse 76  Temp(Src) 98.1 F (36.7 C)  Resp 14  SpO2 100% Physical Exam Physical Exam  Nursing note and vitals reviewed. Constitutional: Malnourished, chronically ill appearing, non-toxic, and in no acute distress Head: Normocephalic and atraumatic.  Mouth/Throat: Oropharynx is clear and moist.  Neck: Normal range of motion. Neck supple.  Cardiovascular: Normal rate and regular rhythm.   Pulmonary/Chest: Effort normal and breath sounds normal.  Abdominal: Soft. Minimal distension. There is no tenderness. There is no rebound and no guarding.  Musculoskeletal: Normal range of motion.  Neurological: Alert, only says "yep" and "yes" during conversation,  no facial droop, fluent speech, moves all extremities symmetrically, does not obey commands Skin: Skin is warm and dry. Jaundiced skin Psychiatric: Cooperative  ED Course  Procedures (including critical care  time) Labs Review Labs Reviewed  CBC WITH DIFFERENTIAL/PLATELET - Abnormal; Notable for the following:    RBC 3.37 (*)    MCV 115.7 (*)    MCH 39.2 (*)    Platelets 90 (*)    Lymphs Abs 0.5 (*)    All other components within normal limits  COMPREHENSIVE METABOLIC PANEL - Abnormal; Notable for the following:    Potassium 5.3 (*)    CO2 21 (*)    Glucose, Bld 126 (*)    BUN 30 (*)    Calcium 8.7 (*)    Albumin 2.5 (*)    AST 57 (*)    Total Bilirubin 6.7 (*)    All other components within normal limits  PROTIME-INR - Abnormal; Notable for the following:  Prothrombin Time 18.5 (*)    INR 1.59 (*)    All other components within normal limits  AMMONIA - Abnormal; Notable for the following:    Ammonia 177 (*)    All other components within normal limits  URINALYSIS, ROUTINE W REFLEX MICROSCOPIC (NOT AT [facility])  POC OCCULT BLOOD, ED    Imaging Review Dg Chest 2 View  09/27/2015  CLINICAL DATA:  Acute mental status change. History of liver carcinoma. EXAM: CHEST  2 VIEW COMPARISON:  September 21, 2015 FINDINGS: Mildly tortuous thoracic aorta. The heart, hila, mediastinum, lungs, and pleura are unchanged with no acute abnormality. IMPRESSION: No active cardiopulmonary disease. Electronically Signed   By: Dorise Bullion III M.D   On: 09/27/2015 14:48   Ct Head Wo Contrast  09/27/2015  CLINICAL DATA:  Altered mental status. EXAM: CT HEAD WITHOUT CONTRAST TECHNIQUE: Contiguous axial images were obtained from the base of the skull through the vertex without intravenous contrast. COMPARISON:  CT scan of April 01, 2014. FINDINGS: Bony calvarium appears intact. Mild diffuse cortical atrophy is noted. Mild chronic ischemic white matter disease is noted. No mass effect or midline shift is noted. Ventricular size is within normal limits. There is no evidence of mass lesion, hemorrhage or acute infarction. IMPRESSION: Mild diffuse cortical atrophy. Mild chronic ischemic white matter disease. No acute  intracranial abnormality seen. Electronically Signed   By: Marijo Conception, M.D.   On: 09/27/2015 14:54   I have personally reviewed and evaluated these images and lab results as part of my medical decision-making.   EKG Interpretation None      MDM   Final diagnoses:  Confusion  Hepatic encephalopathy ([place])  Hepatocellular carcinoma (HCC)  Other cirrhosis of liver ([place])   65 year old male with history of hepatocellular carcinoma and cirrhosis complicated by varices, ascites, and encephalopathy who presents with altered mental status. Presentation is consistent with that of hepatic encephalopathy. Is alert, but only answering yes to questions, but will move extremities symmetrically. No evidence of trauma. Was soft and benign abdomen. No evidence of GI bleed and no history suggestive of infection. Remainder of exam is unremarkable. He has no significant leukocytosis and normal hemoglobin. He has baseline thrombocytopenia. Liver dysfunction seems to be near his baseline. He has elevated ammonia level and clinically appears to have encephalopathy. He was given lactulose. Discussed with [facility] hospitalist, and admitted for observation to Dr. Clementeen Graham.    Forde Dandy, MD 09/27/15 [phone]

## 2015-09-28 DIAGNOSIS — Z87891 Personal history of nicotine dependence: Secondary | ICD-10-CM | POA: Diagnosis not present

## 2015-09-28 DIAGNOSIS — K7460 Unspecified cirrhosis of liver: Secondary | ICD-10-CM | POA: Diagnosis not present

## 2015-09-28 DIAGNOSIS — E875 Hyperkalemia: Secondary | ICD-10-CM | POA: Diagnosis not present

## 2015-09-28 DIAGNOSIS — C220 Liver cell carcinoma: Secondary | ICD-10-CM | POA: Diagnosis present

## 2015-09-28 DIAGNOSIS — D696 Thrombocytopenia, unspecified: Secondary | ICD-10-CM | POA: Diagnosis present

## 2015-09-28 DIAGNOSIS — B1920 Unspecified viral hepatitis C without hepatic coma: Secondary | ICD-10-CM | POA: Diagnosis present

## 2015-09-28 DIAGNOSIS — N400 Enlarged prostate without lower urinary tract symptoms: Secondary | ICD-10-CM | POA: Diagnosis present

## 2015-09-28 DIAGNOSIS — E43 Unspecified severe protein-calorie malnutrition: Secondary | ICD-10-CM | POA: Diagnosis present

## 2015-09-28 DIAGNOSIS — Z681 Body mass index (BMI) 19 or less, adult: Secondary | ICD-10-CM | POA: Diagnosis not present

## 2015-09-28 DIAGNOSIS — L409 Psoriasis, unspecified: Secondary | ICD-10-CM | POA: Diagnosis present

## 2015-09-28 DIAGNOSIS — I10 Essential (primary) hypertension: Secondary | ICD-10-CM | POA: Diagnosis present

## 2015-09-28 DIAGNOSIS — Z79899 Other long term (current) drug therapy: Secondary | ICD-10-CM | POA: Diagnosis not present

## 2015-09-28 DIAGNOSIS — R278 Other lack of coordination: Secondary | ICD-10-CM | POA: Diagnosis present

## 2015-09-28 DIAGNOSIS — Z8249 Family history of ischemic heart disease and other diseases of the circulatory system: Secondary | ICD-10-CM | POA: Diagnosis not present

## 2015-09-28 DIAGNOSIS — Z833 Family history of diabetes mellitus: Secondary | ICD-10-CM | POA: Diagnosis not present

## 2015-09-28 DIAGNOSIS — E860 Dehydration: Secondary | ICD-10-CM | POA: Diagnosis present

## 2015-09-28 DIAGNOSIS — D689 Coagulation defect, unspecified: Secondary | ICD-10-CM | POA: Diagnosis present

## 2015-09-28 DIAGNOSIS — K429 Umbilical hernia without obstruction or gangrene: Secondary | ICD-10-CM | POA: Diagnosis present

## 2015-09-28 DIAGNOSIS — R4182 Altered mental status, unspecified: Secondary | ICD-10-CM | POA: Diagnosis present

## 2015-09-28 DIAGNOSIS — B182 Chronic viral hepatitis C: Secondary | ICD-10-CM | POA: Diagnosis not present

## 2015-09-28 DIAGNOSIS — Z9114 Patient's other noncompliance with medication regimen: Secondary | ICD-10-CM | POA: Diagnosis not present

## 2015-09-28 DIAGNOSIS — K7200 Acute and subacute hepatic failure without coma: Secondary | ICD-10-CM | POA: Diagnosis not present

## 2015-09-28 DIAGNOSIS — I959 Hypotension, unspecified: Secondary | ICD-10-CM | POA: Diagnosis present

## 2015-09-28 DIAGNOSIS — Z794 Long term (current) use of insulin: Secondary | ICD-10-CM | POA: Diagnosis not present

## 2015-09-28 DIAGNOSIS — I341 Nonrheumatic mitral (valve) prolapse: Secondary | ICD-10-CM | POA: Diagnosis present

## 2015-09-28 DIAGNOSIS — N179 Acute kidney failure, unspecified: Secondary | ICD-10-CM | POA: Diagnosis present

## 2015-09-28 DIAGNOSIS — Z8042 Family history of malignant neoplasm of prostate: Secondary | ICD-10-CM | POA: Diagnosis not present

## 2015-09-28 DIAGNOSIS — Z800 Family history of malignant neoplasm of digestive organs: Secondary | ICD-10-CM | POA: Diagnosis not present

## 2015-09-28 DIAGNOSIS — Z825 Family history of asthma and other chronic lower respiratory diseases: Secondary | ICD-10-CM | POA: Diagnosis not present

## 2015-09-28 DIAGNOSIS — E1151 Type 2 diabetes mellitus with diabetic peripheral angiopathy without gangrene: Secondary | ICD-10-CM | POA: Diagnosis not present

## 2015-09-28 DIAGNOSIS — E871 Hypo-osmolality and hyponatremia: Secondary | ICD-10-CM | POA: Diagnosis present

## 2015-09-28 LAB — GLUCOSE, CAPILLARY
GLUCOSE-CAPILLARY: 234 mg/dL — AB (ref 65–99)
GLUCOSE-CAPILLARY: 358 mg/dL — AB (ref 65–99)
Glucose-Capillary: 233 mg/dL — ABNORMAL HIGH (ref 65–99)
Glucose-Capillary: 387 mg/dL — ABNORMAL HIGH (ref 65–99)

## 2015-09-28 LAB — URINALYSIS, ROUTINE W REFLEX MICROSCOPIC
Glucose, UA: 250 mg/dL — AB
Ketones, ur: 15 mg/dL — AB
Nitrite: POSITIVE — AB
Protein, ur: NEGATIVE mg/dL
SPECIFIC GRAVITY, URINE: 1.029 (ref 1.005–1.030)
pH: 5 (ref 5.0–8.0)

## 2015-09-28 LAB — COMPREHENSIVE METABOLIC PANEL
ALBUMIN: 2.4 g/dL — AB (ref 3.5–5.0)
ALT: 30 U/L (ref 17–63)
ANION GAP: 9 (ref 5–15)
AST: 46 U/L — ABNORMAL HIGH (ref 15–41)
Alkaline Phosphatase: 69 U/L (ref 38–126)
BUN: 41 mg/dL — ABNORMAL HIGH (ref 6–20)
CO2: 21 mmol/L — AB (ref 22–32)
Calcium: 8.5 mg/dL — ABNORMAL LOW (ref 8.9–10.3)
Chloride: 108 mmol/L (ref 101–111)
Creatinine, Ser: 1.58 mg/dL — ABNORMAL HIGH (ref 0.61–1.24)
GFR calc Af Amer: 51 mL/min — ABNORMAL LOW (ref >60)
GFR calc non Af Amer: 44 mL/min — ABNORMAL LOW (ref >60)
GLUCOSE: 256 mg/dL — AB (ref 65–99)
POTASSIUM: 4.6 mmol/L (ref 3.5–5.1)
SODIUM: 138 mmol/L (ref 135–145)
TOTAL PROTEIN: 5.8 g/dL — AB (ref 6.5–8.1)
Total Bilirubin: 4.9 mg/dL — ABNORMAL HIGH (ref 0.3–1.2)

## 2015-09-28 LAB — URINE MICROSCOPIC-ADD ON

## 2015-09-28 MED ORDER — LACTULOSE 10 GM/15ML PO SOLN
30.0000 g | Freq: Three times a day (TID) | ORAL | Status: DC
  Administered 2015-09-28 – 2015-09-30 (×6): 30 g via ORAL
  Filled 2015-09-28 (×7): qty 45

## 2015-09-28 MED ORDER — INSULIN GLARGINE 100 UNIT/ML ~~LOC~~ SOLN
4.0000 [IU] | Freq: Every day | SUBCUTANEOUS | Status: DC
  Administered 2015-09-28 – 2015-09-29 (×2): 4 [IU] via SUBCUTANEOUS
  Filled 2015-09-28 (×3): qty 0.04

## 2015-09-28 NOTE — Progress Notes (Signed)
History and Physical  Ethan Wade [id number] DOB: [birth year] DOA: 09/27/2015  Subjective: Patient is still confused. Alert, O#1 only. Still answering questions and responding. Asterixis is present on physical exam. He had no N/V/abd pain but had no BM last night or this morning.    Physical Exam: BP 116/82 mmHg  Pulse 67  Temp(Src) 97.9 F (36.6 C) (Oral)  Resp 16  Ht 5\' 11"  (1.803 m)  Wt 62.5 kg (137 lb 12.6 oz)  BMI 19.23 kg/m2  SpO2 100%  GENERAL :   appears to be in no acute distress. HEAD:           normocephalic. EYES:            PERRL, EOMI.  EARS:           hearing grossly intact. NOSE:           No nasal discharge. THROAT:     Oral cavity and pharynx normal.   NECK:          supple, non-tender.  CARDIAC:    Normal S1 and S2. No gallop. No murmurs.  Vascular:     no peripheral edema. LUNGS:       Clear to auscultation  ABDOMEN: Positive bowel sounds. Soft, nondistended, mildly tender in RUQ/Epigastric area.   MSK:           No joint erythema or tenderness.  EXT           : No significant deformity or joint abnormality. Neuro        : Alert, oriented to place but not person or time. SKIN:            No rash. No lesions. PSYCH:       No hallucination. Patient is not suicidal.          Labs on Admission:  Reviewed.   Radiological Exams on Admission: Dg Chest 2 View  09/27/2015  CLINICAL DATA:  Acute mental status change. History of liver carcinoma. EXAM: CHEST  2 VIEW COMPARISON:  September 21, 2015 FINDINGS: Mildly tortuous thoracic aorta. The heart, hila, mediastinum, lungs, and pleura are unchanged with no acute abnormality. IMPRESSION: No active cardiopulmonary disease. Electronically Signed   By: Dorise Bullion III M.D   On: 09/27/2015 14:48   Ct Head Wo Contrast  09/27/2015  CLINICAL DATA:  Altered mental status. EXAM: CT HEAD WITHOUT CONTRAST TECHNIQUE: Contiguous axial images were obtained from the base of the skull through the vertex without  intravenous contrast. COMPARISON:  CT scan of April 01, 2014. FINDINGS: Bony calvarium appears intact. Mild diffuse cortical atrophy is noted. Mild chronic ischemic white matter disease is noted. No mass effect or midline shift is noted. Ventricular size is within normal limits. There is no evidence of mass lesion, hemorrhage or acute infarction. IMPRESSION: Mild diffuse cortical atrophy. Mild chronic ischemic white matter disease. No acute intracranial abnormality seen. Electronically Signed   By: Marijo Conception, M.D.   On: 09/27/2015 14:54      Assessment/Plan  Hepatic encephalopathy: Due to non adherence  Cont lactulose with titration to 3 BMs /day Continue IVF Continue rifaximin Family insist that patient will need home health RN upon discharge him and if he resists.  Chronic hepatitis C with cirrhosis (HCC) Decompensated with thrombocytopenia, mild coagulopathy and recurrent hepatic encephalopathy. Resume Lasix. Hold Aldactone given hyperkalemia.     Mild dehydration Gentle IV fluids    Controlled type 2 DM with peripheral circulatory disorder ([place])  Monitor on sliding scale coverage Start on lantus 4 units QHS ( half home dose)   Hyperkalemia Resolved   Protein calorie malnutrition Nutrition consult  Input & Output: NA Lines & Tubes: PIV DVT prophylaxis:  SCDs GI prophylaxis: NA Consultants: NA Code Status: Full Family Communication: none at bedside Disposition Plan: keep in [place] till encephalopathy resolves     Gennaro Africa M.D [facility]

## 2015-09-29 LAB — BASIC METABOLIC PANEL
ANION GAP: 8 (ref 5–15)
BUN: 40 mg/dL — ABNORMAL HIGH (ref 6–20)
CHLORIDE: 105 mmol/L (ref 101–111)
CO2: 21 mmol/L — AB (ref 22–32)
Calcium: 7.7 mg/dL — ABNORMAL LOW (ref 8.9–10.3)
Creatinine, Ser: 1.46 mg/dL — ABNORMAL HIGH (ref 0.61–1.24)
GFR calc non Af Amer: 48 mL/min — ABNORMAL LOW (ref >60)
GFR, EST AFRICAN AMERICAN: 56 mL/min — AB (ref >60)
Glucose, Bld: 171 mg/dL — ABNORMAL HIGH (ref 65–99)
POTASSIUM: 3.5 mmol/L (ref 3.5–5.1)
Sodium: 134 mmol/L — ABNORMAL LOW (ref 135–145)

## 2015-09-29 LAB — GLUCOSE, CAPILLARY
GLUCOSE-CAPILLARY: 164 mg/dL — AB (ref 65–99)
Glucose-Capillary: 280 mg/dL — ABNORMAL HIGH (ref 65–99)
Glucose-Capillary: 274 mg/dL — ABNORMAL HIGH (ref 65–99)
Glucose-Capillary: 276 mg/dL — ABNORMAL HIGH (ref 65–99)

## 2015-09-29 LAB — AMMONIA: Ammonia: 49 umol/L — ABNORMAL HIGH (ref 9–35)

## 2015-09-29 MED ORDER — NADOLOL 20 MG PO TABS
20.0000 mg | ORAL_TABLET | Freq: Every day | ORAL | Status: DC
  Administered 2015-09-30: 20 mg via ORAL
  Filled 2015-09-29: qty 1

## 2015-09-29 MED ORDER — RIBAVIRIN 200 MG PO TABS
400.0000 mg | ORAL_TABLET | Freq: Two times a day (BID) | ORAL | Status: DC
  Administered 2015-09-29 – 2015-09-30 (×2): 400 mg via ORAL

## 2015-09-29 NOTE — Progress Notes (Signed)
Pt refused HS dose of lactulose. Pt has had 3 BM today, one extremely large incont. Education provided to pt about purpose of this medication. Hortencia Conradi RN

## 2015-09-29 NOTE — Progress Notes (Signed)
Initial Nutrition Assessment  DOCUMENTATION CODES:   Severe malnutrition in context of chronic illness  INTERVENTION:  -Continue Ensure Enlive po BID, each supplement provides 350 kcal and 20 grams of protein -Continue Boost Breeze po TID, each supplement provides 250 kcal and 9 grams of protein -RD to continue to monitor  NUTRITION DIAGNOSIS:   Malnutrition related to chronic illness as evidenced by severe depletion of muscle mass, severe depletion of body fat, percent weight loss.  GOAL:   Patient will meet greater than or equal to 90% of their needs  MONITOR:   PO intake, Supplement acceptance, I & O's, Labs, Weight trends  REASON FOR ASSESSMENT:   Malnutrition Screening Tool    ASSESSMENT:   Ethan Wade is a 65 y.o. male, Ethan Wade is a 65 y.o. male with a history of diabetes, hepatitis C, liver cirrhosis, liver cancer brought to the emergency department with acute confusion. Patient's brother and daughter at bedside provided history. He has had multiple similar admission for hepatic encephalopathy. Patient was last seen normal yesterday evening and was recently discharged from the hospital earlier this week after being admitted for hepatic encephalopathy patient was not answering his phone this morning and this was a signal to his brother that he was encephalopathic and not taking his medication  Spoke with Ethan Wade and son at bedside. Son endorses poor appetite for  Approximately 1 week. Pt was recently at [facility], discharged, and presented to [facility] Friday. Son states he normally takes Ethan Wade out to eat, and states "sometimes he eats, sometimes he doesn't." He endorses patient gaining weight, which, he has gained 6# since his previous admission last week. However, he still exhibits a 47#/26% severe wt loss in 7 months.  He received a tray during my visit, pt was unsure how much he would actually eat, did not seem confident. Documented PO intake 100% at previous  2 meals in chart.  Nutrition-Focused physical exam completed. Findings are severe fat depletion, severe muscle depletion, and no edema.   Labs and Medications reviewed: Na 134, Ca 7.7 CBGs 164-358 Lactulose  Diet Order:  Diet Heart Room service appropriate?: Yes; Fluid consistency:: Thin  Skin:  Wound (see comment) (Stg II PU to Wrist)  Last BM:  7/2  Height:   Ht Readings from Last 1 Encounters:  09/27/15 5\' 11"  (1.803 m)    Weight:   Wt Readings from Last 1 Encounters:  09/27/15 137 lb 12.6 oz (62.5 kg)    Ideal Body Weight:  78.18 kg  BMI:  Body mass index is 19.23 kg/(m^2).  Estimated Nutritional Needs:   Kcal:  1550-1900 calories  Protein:  65-80 grams  Fluid:  >/= 1.5L  EDUCATION NEEDS:   No education needs identified at this time  Satira Anis. Ethan Cleckley, MS, RD LDN Inpatient Clinical Dietitian Pager [phone]

## 2015-09-29 NOTE — Progress Notes (Signed)
PROGRESS NOTE    Ethan Wade  [name] DOB: [birth year] DOA: 09/27/2015 PCP: Ethan Heck, MD   Brief Narrative: Ethan Wade is a 65 y.o. male, Ethan Wade is a 65 y.o. male with a history of diabetes, hepatitis C, liver cirrhosis, liver cancer brought to the emergency department with acute confusion. Patient's brother and daughter at bedside provided history. He has had multiple similar admission for hepatic encephalopathy. Patient was last seen normal yesterday evening and was recently discharged from the hospital earlier this week after being admitted for hepatic encephalopathy patient was not answering his phone this morning and this was a signal to his brother that he was encephalopathic and not taking his medication. No fever, nausea, vomiting, abdominal pain, bowel or urinary symptoms. Patient awake and alert but extremely confused and does not even follow simple commands. Family inform that he was recommended to have a home health nurse commented check on him but patient was consistently refusing this in the past.   Assessment & Plan:   Principal Problem:   Acute hepatic encephalopathy ([place]) Active Problems:   Thrombocytopenia (HCC)   Chronic hepatitis C with cirrhosis (HCC)   Psoriasis   Malnutrition of moderate degree (HCC)   Chronic liver disease and cirrhosis (HCC)   Mild dehydration   Controlled type 2 DM with peripheral circulatory disorder (HCC)   Hyperkalemia  Hepatic encephalopathy: Due to non adherence  Cont lactulose with titration to 3 BMs /day Continue rifaximin PT consult, will benefit from Rehab.   Hypotension; hold lasix, holder parameters for nadolol.  IV fluids.   Chronic hepatitis C with cirrhosis (HCC) Decompensated with thrombocytopenia, mild coagulopathy and recurrent hepatic encephalopathy. Hold Aldactone given hyperkalemia. Hold lasix due to AKI and hypotension.  Holder parameters for nadolol./  AKI;  Gentle IV  fluids Hold lasix.   Hyponatremia; secondary to cirrhosis. Monitor.   Controlled type 2 DM with peripheral circulatory disorder (HCC) Monitor on sliding scale coverage on lantus 4 units QHS ( half home dose)   Hyperkalemia Resolved  Protein calorie malnutrition Nutrition consult   DVT prophylaxis: SCD.  Code Status: Full code.  Family Communication: care discussed with patient. He relates  he has a brother, but does not wants to involve brother to his care right now.   Disposition Plan: to be determine. Might benefit form SNF> .   Consultants:   none  Procedures:  none  Antimicrobials:   Rifaximin for hepatic encephalopathy    Subjective: He is alert times 3 today. He wants to use walker " to see if he wants to buy it " He can only take lactulose 3 times a day 4 times is too much.    Objective: Filed Vitals:   09/28/15 2221 09/29/15 0524 09/29/15 0717 09/29/15 0726  BP:  84/54 85/57 92/58   Pulse:  66 69   Temp: 95.9 F (35.5 C) 96.9 F (36.1 C) 97.9 F (36.6 C) 97 F (36.1 C)  TempSrc: Axillary Oral  Axillary  Resp:  16    Height:      Weight:      SpO2:  93%      Intake/Output Summary (Last 24 hours) at 09/29/15 0811 Last data filed at 09/28/15 1506  Gross per 24 hour  Intake    480 ml  Output      0 ml  Net    480 ml   Filed Weights   09/27/15 1746  Weight: 62.5 kg (137 lb 12.6 oz)    Examination:  General exam: Appears calm and comfortable  Respiratory system: Clear to auscultation. Respiratory effort normal. Cardiovascular system: S1 & S2 heard, RRR. No JVD, murmurs, rubs, gallops or clicks. No pedal edema. Gastrointestinal system: Abdomen is nondistended, soft and nontender. No organomegaly or masses felt. Normal bowel sounds heard. Central nervous system: Alert and oriented. No focal neurological deficits. Extremities: Symmetric 5 x 5 power. Skin: No rashes, lesions or ulcers Psychiatry: Judgement and insight appear normal. Mood &  affect appropriate.     Data Reviewed: I have personally reviewed following labs and imaging studies  CBC:  Recent Labs Lab 09/27/15 1423  WBC 5.2  NEUTROABS 4.1  HGB 13.2  HCT 39.0  MCV 115.7*  PLT 90*   Basic Metabolic Panel:  Recent Labs Lab 09/27/15 1423 09/28/15 0416  NA 138 138  K 5.3* 4.6  CL 104 108  CO2 21* 21*  GLUCOSE 126* 256*  BUN 30* 41*  CREATININE 1.11 1.58*  CALCIUM 8.7* 8.5*   GFR: Estimated Creatinine Clearance: 40.7 mL/min (by C-G formula based on Cr of 1.58). Liver Function Tests:  Recent Labs Lab 09/27/15 1423 09/28/15 0416  AST 57* 46*  ALT 33 30  ALKPHOS 76 69  BILITOT 6.7* 4.9*  PROT 6.5 5.8*  ALBUMIN 2.5* 2.4*   No results for input(s): LIPASE, AMYLASE in the last 168 hours.  Recent Labs Lab 09/23/15 1327 09/27/15 1424  AMMONIA 47* 177*   Coagulation Profile:  Recent Labs Lab 09/27/15 1423  INR 1.59*   Cardiac Enzymes: No results for input(s): CKTOTAL, CKMB, CKMBINDEX, TROPONINI in the last 168 hours. BNP (last 3 results) No results for input(s): PROBNP in the last 8760 hours. HbA1C: No results for input(s): HGBA1C in the last 72 hours. CBG:  Recent Labs Lab 09/28/15 0755 09/28/15 1227 09/28/15 1637 09/28/15 2131 09/29/15 0751  GLUCAP 234* 233* 387* 358* 164*   Lipid Profile: No results for input(s): CHOL, HDL, LDLCALC, TRIG, CHOLHDL, LDLDIRECT in the last 72 hours. Thyroid Function Tests: No results for input(s): TSH, T4TOTAL, FREET4, T3FREE, THYROIDAB in the last 72 hours. Anemia Panel: No results for input(s): VITAMINB12, FOLATE, FERRITIN, TIBC, IRON, RETICCTPCT in the last 72 hours. Sepsis Labs: No results for input(s): PROCALCITON, LATICACIDVEN in the last 168 hours.  No results found for this or any previous visit (from the past 240 hour(s)).       Radiology Studies: Dg Chest 2 View  09/27/2015  CLINICAL DATA:  Acute mental status change. History of liver carcinoma. EXAM: CHEST  2 VIEW  COMPARISON:  September 21, 2015 FINDINGS: Mildly tortuous thoracic aorta. The heart, hila, mediastinum, lungs, and pleura are unchanged with no acute abnormality. IMPRESSION: No active cardiopulmonary disease. Electronically Signed   By: Dorise Bullion III M.D   On: 09/27/2015 14:48   Ct Head Wo Contrast  09/27/2015  CLINICAL DATA:  Altered mental status. EXAM: CT HEAD WITHOUT CONTRAST TECHNIQUE: Contiguous axial images were obtained from the base of the skull through the vertex without intravenous contrast. COMPARISON:  CT scan of April 01, 2014. FINDINGS: Bony calvarium appears intact. Mild diffuse cortical atrophy is noted. Mild chronic ischemic white matter disease is noted. No mass effect or midline shift is noted. Ventricular size is within normal limits. There is no evidence of mass lesion, hemorrhage or acute infarction. IMPRESSION: Mild diffuse cortical atrophy. Mild chronic ischemic white matter disease. No acute intracranial abnormality seen. Electronically Signed   By: Marijo Conception, M.D.   On: 09/27/2015 14:54  Scheduled Meds: . dicyclomine  10 mg Oral BID  . feeding supplement  1 Container Oral TID BM  . feeding supplement (ENSURE ENLIVE)  237 mL Oral BID BM  . insulin aspart  0-5 Units Subcutaneous QHS  . insulin aspart  0-9 Units Subcutaneous TID WC  . insulin glargine  4 Units Subcutaneous QHS  . lactulose  30 g Oral TID AC & HS  . Ledipasvir-Sofosbuvir  1 tablet Oral Daily  . nadolol  20 mg Oral Daily  . ribavirin  200 mg Oral BID  . rifaximin  550 mg Oral BID  . SORAfenib  400 mg Oral Q12H  . tamsulosin  0.4 mg Oral Daily   Continuous Infusions: . sodium chloride 75 mL/hr at 09/27/15 1946     LOS: 1 day    Time spent: 35 minutes.     Elmarie Shiley, MD [facility] Pager [phone]  If 7PM-7AM, please contact night-coverage [email or web address] Password [email or web address] 09/29/2015, 8:11 AM

## 2015-09-30 DIAGNOSIS — E875 Hyperkalemia: Secondary | ICD-10-CM

## 2015-09-30 DIAGNOSIS — E1151 Type 2 diabetes mellitus with diabetic peripheral angiopathy without gangrene: Secondary | ICD-10-CM

## 2015-09-30 DIAGNOSIS — K7200 Acute and subacute hepatic failure without coma: Principal | ICD-10-CM

## 2015-09-30 LAB — GLUCOSE, CAPILLARY
Glucose-Capillary: 169 mg/dL — ABNORMAL HIGH (ref 65–99)
Glucose-Capillary: 272 mg/dL — ABNORMAL HIGH (ref 65–99)

## 2015-09-30 LAB — COMPREHENSIVE METABOLIC PANEL
ALT: 29 U/L (ref 17–63)
AST: 40 U/L (ref 15–41)
Albumin: 2.2 g/dL — ABNORMAL LOW (ref 3.5–5.0)
Alkaline Phosphatase: 67 U/L (ref 38–126)
Anion gap: 7 (ref 5–15)
BUN: 31 mg/dL — ABNORMAL HIGH (ref 6–20)
CHLORIDE: 107 mmol/L (ref 101–111)
CO2: 19 mmol/L — ABNORMAL LOW (ref 22–32)
CREATININE: 1.09 mg/dL (ref 0.61–1.24)
Calcium: 7.6 mg/dL — ABNORMAL LOW (ref 8.9–10.3)
Glucose, Bld: 164 mg/dL — ABNORMAL HIGH (ref 65–99)
POTASSIUM: 3.6 mmol/L (ref 3.5–5.1)
Sodium: 133 mmol/L — ABNORMAL LOW (ref 135–145)
Total Bilirubin: 4.5 mg/dL — ABNORMAL HIGH (ref 0.3–1.2)
Total Protein: 5.3 g/dL — ABNORMAL LOW (ref 6.5–8.1)

## 2015-09-30 LAB — CBC
HEMATOCRIT: 32.3 % — AB (ref 39.0–52.0)
HEMOGLOBIN: 11.1 g/dL — AB (ref 13.0–17.0)
MCH: 38.8 pg — ABNORMAL HIGH (ref 26.0–34.0)
MCHC: 34.4 g/dL (ref 30.0–36.0)
MCV: 112.9 fL — AB (ref 78.0–100.0)
PLATELETS: 63 10*3/uL — AB (ref 150–400)
RBC: 2.86 MIL/uL — AB (ref 4.22–5.81)
RDW: 15.2 % (ref 11.5–15.5)
WBC: 4.2 10*3/uL (ref 4.0–10.5)

## 2015-09-30 LAB — AMMONIA: AMMONIA: 52 umol/L — AB (ref 9–35)

## 2015-09-30 MED ORDER — ENSURE ENLIVE PO LIQD: 237.0000 mL | Freq: Two times a day (BID) | ORAL | Status: DC

## 2015-09-30 MED ORDER — BOOST / RESOURCE BREEZE PO LIQD: 1.0000 | Freq: Three times a day (TID) | ORAL | Status: DC

## 2015-09-30 MED ORDER — FUROSEMIDE 40 MG PO TABS: 20.0000 mg | ORAL_TABLET | Freq: Every day | ORAL | Status: DC

## 2015-09-30 NOTE — Progress Notes (Signed)
Patient ID: Ethan Wade, male   DOB: 08/13/1950, 65 y.o.   MRN: [id number]                                                                PROGRESS NOTE                                                                                                                                                                                                             Patient Demographics:    Ethan Wade, is a 66 y.o. male, DOB - 01/21/1951, [id number]  Admit date - 09/27/2015   Admitting Physician No admitting provider for patient encounter.  Outpatient Primary MD for the patient is Gerrit Heck, MD  LOS - 4  Outpatient Specialists:    Chief Complaint  Patient presents with  . Altered Mental Status       Brief Narrative  65 y.o. male with a history of diabetes, hepatitis C, liver cirrhosis, liver cancer brought to the emergency department with acute confusion. Patient's brother and daughter at bedside provided history. He has had multiple similar admission for hepatic encephalopathy. Patient was last seen normal yesterday evening and was recently discharged from the hospital earlier this week after being admitted for hepatic encephalopathy patient was not answering his phone this morning and this was a signal to his brother that he was encephalopathic and not taking his medication. No fever, nausea, vomiting, abdominal pain, bowel or urinary symptoms. Patient awake and alert but extremely confused and does not even follow simple commands.Family inform that he was recommended to have a home health nurse commented check on him but patient was consistently refusing this in the past.   Subjective:    Ethan Wade today appears to be axox3.  Doing better.  Pt admits that he was noncompliant with his lactulose.  Pt is able to follow commands,  Appears deconditioned. Recommended rehab, but pt declines.     Assessment  & Plan :    Principal Problem:   Acute hepatic encephalopathy  (HCC) Active Problems:   Thrombocytopenia (HCC)   Chronic hepatitis C with cirrhosis (HCC)   Psoriasis   Malnutrition of moderate degree (HCC)   Chronic liver disease and cirrhosis (HCC)   Mild dehydration   Controlled type 2 DM with peripheral circulatory disorder ([place])  Hyperkalemia   Hepatic encephalopathy: Due to non adherence  Cont lactulose with titration to 3 BMs /day Continue rifaximin PT consult, will benefit from Rehab. Pt declining rehab for now,  His brother is coming to talk to him about this.   Hypotension;  Lasix on hold. holder parameters for nadolol.  IV fluids.   Chronic hepatitis C with cirrhosis (HCC) Decompensated with thrombocytopenia, mild coagulopathy and recurrent hepatic encephalopathy. Spironolactone on hold due to hyperkalemia initially  Lasix on hold due to AKI and hypotension Holder parameters for nadolol.  AKI;  Stop IV Fluid Hold lasix.   Hyponatremia; secondary to cirrhosis. Monitor.   Controlled type 2 DM with peripheral circulatory disorder (HCC) Monitor on sliding scale coverage on lantus 4 units QHS ( half home dose)  Hyperkalemia Resolved, may reintroduce  Protein calorie malnutrition (severe) Nutrition consult,  Pt on resource breeze    Code Status : FULL CODE  Family Communication  : spoke with brother, he is going to try to convince him to go to rehab  Disposition Plan  : ?  Home vs rehab  Barriers For Discharge :   Consults  :  Nutrition, PT, social work  Procedures  :   DVT Prophylaxis  :   SCDs   Lab Results  Component Value Date   PLT 63* 09/30/2015    Antibiotics  :  Rifaximin for hepatic encephalopathy  Anti-infectives    Start     Dose/Rate Route Frequency Ordered Stop   09/29/15 2200  ribavirin (RIBASPHERE) tablet 200 mg     400 mg Oral 2 times daily 09/29/15 1509     09/27/15 2200  Ledipasvir-Sofosbuvir 90-400 MG TABS 1 tablet     1 tablet Oral Daily 09/27/15 1914     09/27/15 2200  ribavirin  (COPEGUS) tablet 200 mg  Status:  Discontinued     200 mg Oral 2 times daily 09/27/15 1914 09/29/15 1509   09/27/15 2200  rifaximin (XIFAXAN) tablet 550 mg     550 mg Oral 2 times daily 09/27/15 1914          Objective:   Filed Vitals:   09/29/15 0726 09/29/15 1504 09/29/15 2119 09/30/15 0439  BP: 92/58 103/75 109/83 104/77  Pulse:  84 84 80  Temp: 97 F (36.1 C) 98.2 F (36.8 C) 98.7 F (37.1 C) 97.9 F (36.6 C)  TempSrc: Axillary Oral Oral Oral  Resp:  18 18 16   Height:      Weight:      SpO2:  100% 100% 100%    Wt Readings from Last 3 Encounters:  09/27/15 62.5 kg (137 lb 12.6 oz)  09/23/15 59.829 kg (131 lb 14.4 oz)  06/08/15 66.225 kg (146 lb)     Intake/Output Summary (Last 24 hours) at 09/30/15 0830 Last data filed at 09/29/15 1700  Gross per 24 hour  Intake    240 ml  Output      0 ml  Net    240 ml     Physical Exam  Awake Alert, Oriented X 3, No new F.N deficits, Normal affect Shafer.AT,PERRAL Supple Neck,No JVD, No cervical lymphadenopathy appriciated.  Symmetrical Chest wall movement, Good air movement bilaterally, CTAB RRR,No Gallops,Rubs or new Murmurs, No Parasternal Heave +ve B.Sounds, Abd Soft, slightly distended, No tenderness, No organomegaly appriciated, No rebound - guarding or rigidity. No Cyanosis, Clubbing or edema, + palmar erythema.  No asterixis    Data Review:    CBC  Recent Labs Lab 09/27/15  1423 09/30/15 0426  WBC 5.2 4.2  HGB 13.2 11.1*  HCT 39.0 32.3*  PLT 90* 63*  MCV 115.7* 112.9*  MCH 39.2* 38.8*  MCHC 33.8 34.4  RDW 15.3 15.2  LYMPHSABS 0.5*  --   MONOABS 0.5  --   EOSABS 0.1  --   BASOSABS 0.0  --     Chemistries   Recent Labs Lab 09/27/15 1423 09/28/15 0416 09/29/15 0813 09/30/15 0426  NA 138 138 134* 133*  K 5.3* 4.6 3.5 3.6  CL 104 108 105 107  CO2 21* 21* 21* 19*  GLUCOSE 126* 256* 171* 164*  BUN 30* 41* 40* 31*  CREATININE 1.11 1.58* 1.46* 1.09  CALCIUM 8.7* 8.5* 7.7* 7.6*  AST 57* 46*   --  40  ALT 33 30  --  29  ALKPHOS 76 69  --  67  BILITOT 6.7* 4.9*  --  4.5*   ------------------------------------------------------------------------------------------------------------------ No results for input(s): CHOL, HDL, LDLCALC, TRIG, CHOLHDL, LDLDIRECT in the last 72 hours.  Lab Results  Component Value Date   HGBA1C 5.5 09/21/2015   ------------------------------------------------------------------------------------------------------------------ No results for input(s): TSH, T4TOTAL, T3FREE, THYROIDAB in the last 72 hours.  Invalid input(s): FREET3 ------------------------------------------------------------------------------------------------------------------ No results for input(s): VITAMINB12, FOLATE, FERRITIN, TIBC, IRON, RETICCTPCT in the last 72 hours.  Coagulation profile  Recent Labs Lab 09/27/15 1423  INR 1.59*    No results for input(s): DDIMER in the last 72 hours.  Cardiac Enzymes No results for input(s): CKMB, TROPONINI, MYOGLOBIN in the last 168 hours.  Invalid input(s): CK ------------------------------------------------------------------------------------------------------------------ No results found for: BNP  Inpatient Medications  Scheduled Meds: . dicyclomine  10 mg Oral BID  . feeding supplement  1 Container Oral TID BM  . feeding supplement (ENSURE ENLIVE)  237 mL Oral BID BM  . insulin aspart  0-5 Units Subcutaneous QHS  . insulin aspart  0-9 Units Subcutaneous TID WC  . insulin glargine  4 Units Subcutaneous QHS  . lactulose  30 g Oral TID AC & HS  . Ledipasvir-Sofosbuvir  1 tablet Oral Daily  . nadolol  20 mg Oral Daily  . ribavirin  400 mg Oral BID  . rifaximin  550 mg Oral BID  . SORAfenib  400 mg Oral Q12H  . tamsulosin  0.4 mg Oral Daily   Continuous Infusions: . sodium chloride 75 mL/hr at 09/27/15 1946   PRN Meds:.acetaminophen **OR** acetaminophen, hydroxypropyl methylcellulose / hypromellose, ondansetron **OR**  ondansetron (ZOFRAN) IV  Micro Results No results found for this or any previous visit (from the past 240 hour(s)).  Radiology Reports Dg Chest 2 View  09/27/2015  CLINICAL DATA:  Acute mental status change. History of liver carcinoma. EXAM: CHEST  2 VIEW COMPARISON:  September 21, 2015 FINDINGS: Mildly tortuous thoracic aorta. The heart, hila, mediastinum, lungs, and pleura are unchanged with no acute abnormality. IMPRESSION: No active cardiopulmonary disease. Electronically Signed   By: Dorise Bullion III M.D   On: 09/27/2015 14:48   Ct Head Wo Contrast  09/27/2015  CLINICAL DATA:  Altered mental status. EXAM: CT HEAD WITHOUT CONTRAST TECHNIQUE: Contiguous axial images were obtained from the base of the skull through the vertex without intravenous contrast. COMPARISON:  CT scan of April 01, 2014. FINDINGS: Bony calvarium appears intact. Mild diffuse cortical atrophy is noted. Mild chronic ischemic white matter disease is noted. No mass effect or midline shift is noted. Ventricular size is within normal limits. There is no evidence of mass lesion, hemorrhage or acute infarction. IMPRESSION:  Mild diffuse cortical atrophy. Mild chronic ischemic white matter disease. No acute intracranial abnormality seen. Electronically Signed   By: Marijo Conception, M.D.   On: 09/27/2015 14:54   Dg Chest Port 1 View  09/21/2015  CLINICAL DATA:  Encephalopathy EXAM: PORTABLE CHEST 1 VIEW COMPARISON:  05/08/2015 FINDINGS: Normal mediastinum and cardiac silhouette. Normal pulmonary vasculature. No evidence of effusion, infiltrate, or pneumothorax. No acute bony abnormality. IMPRESSION: No acute cardiopulmonary process. Electronically Signed   By: Suzy Bouchard M.D.   On: 09/21/2015 15:59    Time Spent in minutes  30   Jani Gravel M.D on 09/30/2015 at 8:30 AM  Between 7am to 7pm - Pager - [phone]  After 7pm go to [email or web address] - password [facility]  [facility] -  Office  [phone]

## 2015-09-30 NOTE — Clinical Documentation Improvement (Signed)
Hospitalist  Can the diagnosis of Malnutrition be further specified?   Document Severity - Severe(third degree), Moderate (second degree), Mild (first degree)  Form - [place] (rarely seen in the [place]asm[place], Other Condition, Unable to Determine  Other condition  Unable to clinically determine  Document any associated diagnoses/conditions   Supporting Information: :  Severe Malnutrition related to chronic illness as evidenced by severe depletion of muscle mass, severe depletion of body fat, percent weight loss per 7/02 Registered Dietician's evaluation.   Please exercise your independent, professional judgment when responding. A specific answer is not anticipated or expected.   Thank You, [place] [phone]

## 2015-09-30 NOTE — Progress Notes (Signed)
Patient d/c home. Stable. 

## 2015-09-30 NOTE — Evaluation (Signed)
Physical Therapy Evaluation Patient Details Name: Ethan Wade MRN: [id number] DOB: 04-05-1950 Today's Date: 09/30/2015   History of Present Illness  65 yo male admitted with acute hepatic encephalopathy. Hx of Hep C, liver cancer, cirrhosis, DM  Clinical Impression  On eval, pt required Min assist for mobility-walked ~150 feet. Improved ambulation safety and stability with use of walker. Discussed d/c plan-pt refused SNF placement. Brother is concerned and states pt will need assistance at home. Recommend HHPT, [place], home health aide, and RN if at all possible. Also recommend RW use for ambulation.     Follow Up Recommendations Home health PT;Supervision - Intermittent;Supervision for mobility/OOB;Home Health Aide. (Pt refused SNF)    Equipment Recommendations  Rolling walker with 5" wheels    Recommendations for Other Services       Precautions / Restrictions Precautions Precautions: Fall Restrictions Weight Bearing Restrictions: No      Mobility  Bed Mobility   Bed Mobility: Supine to Sit     Supine to sit: Supervision     General bed mobility comments: for safaety  Transfers Overall transfer level: Needs assistance Equipment used: None Transfers: Sit to/from Stand Sit to Stand: Min guard         General transfer comment: close guard for safety. unsteady.   Ambulation/Gait Ambulation/Gait assistance: Min assist Ambulation Distance (Feet): 150 Feet Assistive device: 1 person hand held assist;Rolling walker (2 wheeled) Gait Pattern/deviations: Step-through pattern;Decreased stride length;Drifts right/left;Staggering right     General Gait Details: walked ~75 feet with 1 HHA-unsteady, reaching out for objects to hold on to. Then walked another 75 feet with RW-improved stability. VCs for safe use of walker.   Stairs            Wheelchair Mobility    Modified Rankin (Stroke Patients Only)       Balance           Standing balance support:  During functional activity Standing balance-Leahy Scale: Fair                               Pertinent Vitals/Pain Pain Assessment: No/denies pain    Home Living Family/patient expects to be discharged to:: Private residence Living Arrangements: Alone Available Help at Discharge: Family (brother checks on in evenings) Type of Home: House Home Access: Stairs to enter   [employer] of Steps: 2 Home Layout: One level Home Equipment: Cane - single point;Bedside commode      Prior Function Level of Independence: Independent with assistive device(s)         Comments: uses cane occasionally     Hand Dominance        Extremity/Trunk Assessment   Upper Extremity Assessment: Overall WFL for tasks assessed           Lower Extremity Assessment: Generalized weakness      Cervical / Trunk Assessment: Normal  Communication   Communication: No difficulties  Cognition Arousal/Alertness: Awake/alert Behavior During Therapy: WFL for tasks assessed/performed Overall Cognitive Status: Impaired/Different from baseline               Problem Solving: Slow processing      General Comments      Exercises        Assessment/Plan    PT Assessment Patient needs continued PT services  PT Diagnosis Difficulty walking;Generalized weakness   PT Problem List Decreased strength;Decreased activity tolerance;Decreased balance;Decreased mobility;Decreased knowledge of use of DME  PT Treatment Interventions  DME instruction;Functional mobility training;Therapeutic activities;Patient/family education;Gait training;Therapeutic exercise   PT Goals (Current goals can be found in the Care Plan section) Acute Rehab PT Goals Patient Stated Goal: home PT Goal Formulation: With patient Time For Goal Achievement: 10/14/15 Potential to Achieve Goals: Good    Frequency Min 3X/week   Barriers to discharge        Co-evaluation               End of  Session Equipment Utilized During Treatment: Gait belt Activity Tolerance: Patient tolerated treatment well Patient left: in bed;with call bell/phone within reach;with bed alarm set;with family/visitor present           Time: 1140-1155 PT Time Calculation (min) (ACUTE ONLY): 15 min   Charges:   PT Evaluation $PT Eval Low Complexity: 1 Procedure     PT G Codes:        Weston Anna, MPT Pager: [phone]

## 2015-09-30 NOTE — Care Management Note (Signed)
Case Management Note  Patient Details  Name: Ethan Wade MRN: [id number] Date of Birth: June 19, 1950  Subjective/Objective:      65 yo admitted with Acute Hepatic Encephalopathy              Action/Plan: Pt from home alone. PT recommendations were gone over with pt and brother Juanda Crumble at bedside. Pt wants to go home with [facility] services. Choice offered for [facility] and [facility] chosen. [facility] rep contacted for referral. RW ordered and [facility] DME rep contacted for RW. No other DC needs communicated.  Expected Discharge Date:   (unknown)               Expected Discharge Plan:  [place]  In-House Referral:     Discharge planning Services  CM Consult  Post Acute Care Choice:  Home Health Choice offered to:  Sibling, Patient  DME Arranged:  Walker rolling DME Agency:  [place] Arranged:  RN, PT, OT, Nurse's Aide [place] Agency:  [place]  Status of Service:  Completed, signed off  If discussed at [place] of Stay Meetings, dates discussed:    Additional CommentsLynnell Catalan, RN 09/30/2015, 12:25 PM [phone]

## 2015-09-30 NOTE — Progress Notes (Signed)
Inpatient Diabetes Program Recommendations  AACE/ADA: New Consensus Statement on Inpatient Glycemic Control (2015)  Target Ranges:  Prepandial:   less than 140 mg/dL      Peak postprandial:   less than 180 mg/dL (1-2 hours)      Critically ill patients:  140 - 180 mg/dL   Results for Ethan Wade, Ethan Wade (MRN [id number]) as of 09/30/2015 08:46  Ref. Range 09/29/2015 07:51 09/29/2015 12:51 09/29/2015 16:52 09/29/2015 21:30  Glucose-Capillary Latest Ref Range: 65-99 mg/dL 164 (H) 280 (H) 274 (H) 276 (H)    Admit with: AMS  History: DM2, Liver Cancer, Cirrhosis  Home DM Meds: Lantus 8 units daily  Current Insulin Orders: Lantus 4 units QHS      Novolog Sensitive Correction Scale/ SSI (0-9 units) TID AC + HS      -Patient eating 100% of meals per documentation.  -Having elevated postprandial glucose elevations.     MD- Please consider the following in-hospital insulin adjustments:  1. Increase Lantus to 6 units QHS  2. Start Novolog Meal Coverage- Novolog 4 units tid with meals (hold if pt eats <50% of meal)    --Will follow patient during hospitalization--  Wyn Quaker RN, MSN, CDE Diabetes Coordinator Inpatient Glycemic Control Team Team Pager: [phone] (8a-5p)

## 2015-09-30 NOTE — Progress Notes (Signed)
Patient's d/c instructions and home meds given, verbalized understanding. All questions answered appropritely. Patient is stable.

## 2015-09-30 NOTE — Discharge Summary (Signed)
Ethan Wade, is a 65 y.o. male  DOB Oct 02, 1950  MRN [id number].  Admission date:  09/27/2015  Admitting Physician  No admitting provider for patient encounter.  Discharge Date:  09/30/2015   Primary MD  Ethan Heck, MD  Recommendations for primary care physician for things to follow:   Hepatic encephalopathy secondary to medication noncompliance Have stressed to patient importance of compliance with lactulose Cont rifaxamin.  Please check ammonia level in 1 week  Hyponatremia Have restarted lasix at 20mg  po qday,  Prev on 40mg  po qday  Hyperkalemia Stopped Aldactone for now, consider reintroduction Check cmp in 1 week  AKI Lasix was held and have now restarted at lower dose.   Anemia/ thrombocytopenia Check cbc in 1 week  Dm2 Cont prior dose of lantus.   Severe protein calorie malnutrition Nutrition was consulted during this admission  Cont nutritional supplement  Cirrhosis, Hep C, hepatocellular carcinoma Cont current medications.    Admission Diagnosis  Hepatic encephalopathy (HCC) [K72.90] Confusion [R41.0] Hepatocellular carcinoma ([place]) [C22.0] Other cirrhosis of liver ([place]) [K74.69]   Discharge Diagnosis  Hepatic encephalopathy ([place]) [K72.90] Confusion [R41.0] Hepatocellular carcinoma ([place]) [C22.0] Other cirrhosis of liver ([place]) [K74.69]    Principal Problem:   Acute hepatic encephalopathy ([place]) Active Problems:   Thrombocytopenia (HCC)   Chronic hepatitis C with cirrhosis (HCC)   Psoriasis   Malnutrition of moderate degree (HCC)   Chronic liver disease and cirrhosis (HCC)   Mild dehydration   Controlled type 2 DM with peripheral circulatory disorder (HCC)   Hyperkalemia      Past Medical History  Diagnosis Date  . Macrocytosis   . Thrombocytopenia ([place])   . Esophageal bleed, non-variceal 2006  . HTN (hypertension)   . Psoriasis   . Mitral  valve prolapse     OCCAS FLUTTER FEELING  . Vitamin D deficiency   . Umbilical hernia     OCCAS DISCOMFORT  . Prostate enlargement     PT TOLD "NORMAL FOR AGE" - TAKES FINASTERIDE AND FLOMAX  . Heart murmur   . Type II diabetes mellitus ([place])   . Rheumatic fever 1950's  . Hepatitis C 1980's  . Arthritis     "joint stiffness in the knees" (11/10/2013)  . Liver cancer ([place])   . Ascites   . Hepatic encephalopathy ([place]) 05/2015    Past Surgical History  Procedure Laterality Date  . Tonsilectomy, adenoidectomy, bilateral myringotomy and tubes  child  . Umbilical hernia repair  03/09/2012    Procedure: HERNIA REPAIR UMBILICAL ADULT;  Surgeon: Ethan Jolly, MD;  Location: [facility];  Service: General;  Laterality: N/A;  Repair Umbilical Hernia with Mesh  . Insertion of mesh  03/09/2012    Procedure: INSERTION OF MESH;  Surgeon: Ethan Jolly, MD;  Location: [facility];  Service: General;  Laterality: N/A;  . Tonsillectomy    . Hernia repair         HPI  from the history and physical done on the day of admission:  65 y.o. male, Ethan Wade is a 65 y.o. male with a history of diabetes, hepatitis C, liver cirrhosis, liver cancer brought to the emergency department with acute confusion. Patient's brother and daughter at bedside provided history. He has had multiple similar admission for hepatic encephalopathy. Patient was last seen normal yesterday evening and was recently discharged from the hospital earlier this week after being admitted for hepatic encephalopathy patient was not answering his phone this morning and this was a signal to his brother that he was encephalopathic and not taking his medication. No fever, nausea, vomiting, abdominal pain, bowel or urinary symptoms. Patient awake and alert but extremely confused and does not even follow simple commands. Family inform that he was recommended to have a home health nurse commented check on him but patient was consistently  refusing this in the past.  Course in the ED Blood pressure was low normal. Patient was extremely confused and poorly communicating, answering just with a yes. Blood work showed normal WBC and hemoglobin, platelets of 90 (chronic). Chemistry showed potassium of 5.3, BUN of 30 and INR of 1.59. Glucose of 126. Chest x-ray and head CT were unremarkable for acute findings. Patient given 30 g oral lactulose and hospitalist admission requested.      Hospital Course:        Lactulose restarted.  Mental status and ammonia level improved.  Spironolactone discontinued due to hyperkalemia.   Lasix was held due to renal insufficiency and creatinine improved. From 1.58 => 1.09 on day of discharge.  Pt appears to be a baseline mental status and will be discharged to home.  Pt declined to go to SNF for rehab.    Follow UP  Follow-up Information    Follow up with Ethan Heck, MD In 1 week.   Specialty:  Family Medicine   Why:  for f/u of potassium and sodium and renal function    Contact information:   [place] [address] [phone]        Consults obtained - none  Discharge Condition: stable  Diet and Activity recommendation: See Discharge Instructions below  Discharge Instructions    Please follow up with your PCP for cbc, cmp, ammonia level in 1 week.       Discharge Medications       Medication List    STOP taking these medications        spironolactone 100 MG tablet  Commonly known as:  ALDACTONE     zolpidem 5 MG tablet  Commonly known as:  AMBIEN      TAKE these medications        CLEAR EYES FOR DRY EYES 1-0.25 % Soln  Generic drug:  Carboxymethylcellul-Glycerin  Apply 1-2 drops to eye daily as needed (for dry eyes). Reported on 05/20/2015     dicyclomine 10 MG capsule  Commonly known as:  BENTYL  Take 1 capsule (10 mg total) by mouth 2 (two) times daily.     feeding supplement Liqd  Take 1 Container by mouth 3 (three)  times daily between meals.     feeding supplement (ENSURE ENLIVE) Liqd  Take 237 mLs by mouth 2 (two) times daily between meals.     furosemide 40 MG tablet  Commonly known as:  LASIX  Take 0.5 tablets (20 mg total) by mouth daily.     HARVONI 90-400 MG Tabs  Generic drug:  Ledipasvir-Sofosbuvir  Take 1 tablet by mouth daily.     insulin glargine 100 UNIT/ML  injection  Commonly known as:  LANTUS  Inject 0.08 mLs (8 Units total) into the skin daily.     lactulose 10 GM/15ML solution  Commonly known as:  CHRONULAC  Take 45 mLs (30 g total) by mouth 3 (three) times daily.     nadolol 20 MG tablet  Commonly known as:  CORGARD  Take 20 mg by mouth daily.     oxyCODONE 5 MG immediate release tablet  Commonly known as:  Oxy IR/ROXICODONE  Take 1 tablet (5 mg total) by mouth every 12 (twelve) hours as needed for severe pain or breakthrough pain (only for severe or breakthrough pain).     RIBASPHERE 200 MG tablet  Generic drug:  ribavirin  Take 400 mg by mouth 2 (two) times daily.     SORAfenib 200 MG tablet  Commonly known as:  NEXAVAR  Take 400 mg by mouth every 12 (twelve) hours. Take on an empty stomach 1 hour before or 2 hours after meals.     tamsulosin 0.4 MG Caps capsule  Commonly known as:  FLOMAX  Take 0.4 mg by mouth daily.     XIFAXAN 550 MG Tabs tablet  Generic drug:  rifaximin  Take 550 mg by mouth 2 (two) times daily.        Major procedures and Radiology Reports - PLEASE review detailed and final reports for all details, in brief -      Dg Chest 2 View  09/27/2015  CLINICAL DATA:  Acute mental status change. History of liver carcinoma. EXAM: CHEST  2 VIEW COMPARISON:  September 21, 2015 FINDINGS: Mildly tortuous thoracic aorta. The heart, hila, mediastinum, lungs, and pleura are unchanged with no acute abnormality. IMPRESSION: No active cardiopulmonary disease. Electronically Signed   By: Dorise Bullion III M.D   On: 09/27/2015 14:48   Ct Head Wo  Contrast  09/27/2015  CLINICAL DATA:  Altered mental status. EXAM: CT HEAD WITHOUT CONTRAST TECHNIQUE: Contiguous axial images were obtained from the base of the skull through the vertex without intravenous contrast. COMPARISON:  CT scan of April 01, 2014. FINDINGS: Bony calvarium appears intact. Mild diffuse cortical atrophy is noted. Mild chronic ischemic white matter disease is noted. No mass effect or midline shift is noted. Ventricular size is within normal limits. There is no evidence of mass lesion, hemorrhage or acute infarction. IMPRESSION: Mild diffuse cortical atrophy. Mild chronic ischemic white matter disease. No acute intracranial abnormality seen. Electronically Signed   By: Marijo Conception, M.D.   On: 09/27/2015 14:54   Dg Chest Port 1 View  09/21/2015  CLINICAL DATA:  Encephalopathy EXAM: PORTABLE CHEST 1 VIEW COMPARISON:  05/08/2015 FINDINGS: Normal mediastinum and cardiac silhouette. Normal pulmonary vasculature. No evidence of effusion, infiltrate, or pneumothorax. No acute bony abnormality. IMPRESSION: No acute cardiopulmonary process. Electronically Signed   By: Suzy Bouchard M.D.   On: 09/21/2015 15:59    Micro Results     No results found for this or any previous visit (from the past 240 hour(s)).     Today   Subjective    Ethan Wade today has no headache,no chest abdominal pain,no new weakness tingling or numbness, feels much better wants to go home today.  axox3   Objective   Blood pressure 104/77, pulse 80, temperature 97.9 F (36.6 C), temperature source Oral, resp. rate 16, height 5\' 11"  (1.803 m), weight 62.5 kg (137 lb 12.6 oz), SpO2 100 %.   Intake/Output Summary (Last 24 hours) at 09/30/15 1227 Last  data filed at 09/30/15 0900  Gross per 24 hour  Intake    480 ml  Output      0 ml  Net    480 ml    Exam Awake Alert, Oriented x 3, No new F.N deficits, Normal affect [place].AT,PERRAL Supple Neck,No JVD, No cervical lymphadenopathy appriciated.   Symmetrical Chest wall movement, Good air movement bilaterally, CTAB RRR,No Gallops,Rubs or new Murmurs, No Parasternal Heave +ve B.Sounds, Abd Soft, Non tender, No organomegaly appriciated, No rebound -guarding or rigidity. No Cyanosis, Clubbing or edema, No new Rash or bruise Slight palmar erythema, no asterixis.    Data Review   CBC w Diff: Lab Results  Component Value Date   WBC 4.2 09/30/2015   WBC 4.0 04/27/2012   HGB 11.1* 09/30/2015   HGB 16.2 04/27/2012   HCT 32.3* 09/30/2015   HCT 45.5 04/27/2012   PLT 63* 09/30/2015   PLT 69* 04/27/2012   LYMPHOPCT 10 09/27/2015   LYMPHOPCT 26.8 04/27/2012   MONOPCT 9 09/27/2015   MONOPCT 10.8 04/27/2012   EOSPCT 1 09/27/2015   EOSPCT 3.2 04/27/2012   BASOPCT 0 09/27/2015   BASOPCT 0.2 04/27/2012    CMP: Lab Results  Component Value Date   NA 133* 09/30/2015   NA 143 04/27/2012   K 3.6 09/30/2015   K 4.3 04/27/2012   CL 107 09/30/2015   CL 105 04/27/2012   CO2 19* 09/30/2015   CO2 28 04/27/2012   BUN 31* 09/30/2015   BUN 8.4 04/27/2012   CREATININE 1.09 09/30/2015   CREATININE 0.8 04/27/2012   PROT 5.3* 09/30/2015   PROT 6.7 04/27/2012   ALBUMIN 2.2* 09/30/2015   ALBUMIN 3.2* 04/27/2012   BILITOT 4.5* 09/30/2015   BILITOT 3.29* 04/27/2012   ALKPHOS 67 09/30/2015   ALKPHOS 108 04/27/2012   AST 40 09/30/2015   AST 78* 04/27/2012   ALT 29 09/30/2015   ALT 72* 04/27/2012  .   Total Time in preparing paper work, data evaluation and todays exam - 66 minutes  Jani Gravel M.D on 09/30/2015 at 12:27 PM  [facility]   Office  [phone]

## 2015-10-07 ENCOUNTER — Encounter (HOSPITAL_COMMUNITY): Payer: Self-pay

## 2015-10-07 ENCOUNTER — Emergency Department (HOSPITAL_COMMUNITY): Payer: Medicare Other

## 2015-10-07 ENCOUNTER — Inpatient Hospital Stay (HOSPITAL_COMMUNITY)
Admission: EM | Admit: 2015-10-07 | Discharge: 2015-10-09 | DRG: 441 | Disposition: A | Discharge status: Home Health | Payer: Medicare Other | Attending: Internal Medicine | Admitting: Internal Medicine

## 2015-10-07 DIAGNOSIS — Z9114 Patient's other noncompliance with medication regimen: Secondary | ICD-10-CM | POA: Diagnosis not present

## 2015-10-07 DIAGNOSIS — Z8249 Family history of ischemic heart disease and other diseases of the circulatory system: Secondary | ICD-10-CM | POA: Diagnosis not present

## 2015-10-07 DIAGNOSIS — Z79899 Other long term (current) drug therapy: Secondary | ICD-10-CM

## 2015-10-07 DIAGNOSIS — B182 Chronic viral hepatitis C: Secondary | ICD-10-CM | POA: Diagnosis present

## 2015-10-07 DIAGNOSIS — K7460 Unspecified cirrhosis of liver: Secondary | ICD-10-CM | POA: Diagnosis not present

## 2015-10-07 DIAGNOSIS — Z8505 Personal history of malignant neoplasm of liver: Secondary | ICD-10-CM | POA: Diagnosis not present

## 2015-10-07 DIAGNOSIS — R4182 Altered mental status, unspecified: Secondary | ICD-10-CM | POA: Diagnosis not present

## 2015-10-07 DIAGNOSIS — Z9119 Patient's noncompliance with other medical treatment and regimen: Secondary | ICD-10-CM

## 2015-10-07 DIAGNOSIS — Z66 Do not resuscitate: Secondary | ICD-10-CM | POA: Diagnosis present

## 2015-10-07 DIAGNOSIS — I341 Nonrheumatic mitral (valve) prolapse: Secondary | ICD-10-CM | POA: Diagnosis present

## 2015-10-07 DIAGNOSIS — K7290 Hepatic failure, unspecified without coma: Secondary | ICD-10-CM

## 2015-10-07 DIAGNOSIS — Z515 Encounter for palliative care: Secondary | ICD-10-CM | POA: Diagnosis present

## 2015-10-07 DIAGNOSIS — Z794 Long term (current) use of insulin: Secondary | ICD-10-CM

## 2015-10-07 DIAGNOSIS — Z800 Family history of malignant neoplasm of digestive organs: Secondary | ICD-10-CM

## 2015-10-07 DIAGNOSIS — D696 Thrombocytopenia, unspecified: Secondary | ICD-10-CM | POA: Diagnosis present

## 2015-10-07 DIAGNOSIS — N400 Enlarged prostate without lower urinary tract symptoms: Secondary | ICD-10-CM | POA: Diagnosis present

## 2015-10-07 DIAGNOSIS — Z7401 Bed confinement status: Secondary | ICD-10-CM | POA: Diagnosis not present

## 2015-10-07 DIAGNOSIS — I10 Essential (primary) hypertension: Secondary | ICD-10-CM | POA: Diagnosis present

## 2015-10-07 DIAGNOSIS — Z8042 Family history of malignant neoplasm of prostate: Secondary | ICD-10-CM | POA: Diagnosis not present

## 2015-10-07 DIAGNOSIS — I959 Hypotension, unspecified: Secondary | ICD-10-CM | POA: Diagnosis present

## 2015-10-07 DIAGNOSIS — Z825 Family history of asthma and other chronic lower respiratory diseases: Secondary | ICD-10-CM | POA: Diagnosis not present

## 2015-10-07 DIAGNOSIS — J9811 Atelectasis: Secondary | ICD-10-CM | POA: Diagnosis present

## 2015-10-07 DIAGNOSIS — E1151 Type 2 diabetes mellitus with diabetic peripheral angiopathy without gangrene: Secondary | ICD-10-CM | POA: Diagnosis not present

## 2015-10-07 DIAGNOSIS — E43 Unspecified severe protein-calorie malnutrition: Secondary | ICD-10-CM | POA: Insufficient documentation

## 2015-10-07 DIAGNOSIS — K7682 Hepatic encephalopathy: Secondary | ICD-10-CM

## 2015-10-07 DIAGNOSIS — Z720 Tobacco use: Secondary | ICD-10-CM | POA: Diagnosis not present

## 2015-10-07 DIAGNOSIS — G9340 Encephalopathy, unspecified: Secondary | ICD-10-CM | POA: Diagnosis not present

## 2015-10-07 DIAGNOSIS — Z7189 Other specified counseling: Secondary | ICD-10-CM | POA: Insufficient documentation

## 2015-10-07 DIAGNOSIS — Z833 Family history of diabetes mellitus: Secondary | ICD-10-CM

## 2015-10-07 DIAGNOSIS — K7200 Acute and subacute hepatic failure without coma: Principal | ICD-10-CM | POA: Diagnosis present

## 2015-10-07 DIAGNOSIS — C220 Liver cell carcinoma: Secondary | ICD-10-CM | POA: Diagnosis present

## 2015-10-07 DIAGNOSIS — N179 Acute kidney failure, unspecified: Secondary | ICD-10-CM | POA: Diagnosis present

## 2015-10-07 DIAGNOSIS — Z6822 Body mass index (BMI) 22.0-22.9, adult: Secondary | ICD-10-CM | POA: Diagnosis not present

## 2015-10-07 LAB — CBC WITH DIFFERENTIAL/PLATELET
BASOS PCT: 0 %
Basophils Absolute: 0 10*3/uL (ref 0.0–0.1)
EOS PCT: 1 %
Eosinophils Absolute: 0.1 10*3/uL (ref 0.0–0.7)
HEMATOCRIT: 34.3 % — AB (ref 39.0–52.0)
HEMOGLOBIN: 11.6 g/dL — AB (ref 13.0–17.0)
Lymphocytes Relative: 11 %
Lymphs Abs: 0.7 10*3/uL (ref 0.7–4.0)
MCH: 39.2 pg — ABNORMAL HIGH (ref 26.0–34.0)
MCHC: 33.8 g/dL (ref 30.0–36.0)
MCV: 115.9 fL — AB (ref 78.0–100.0)
MONO ABS: 0.5 10*3/uL (ref 0.1–1.0)
MONOS PCT: 8 %
NEUTROS PCT: 80 %
Neutro Abs: 5 10*3/uL (ref 1.7–7.7)
Platelets: 75 10*3/uL — ABNORMAL LOW (ref 150–400)
RBC: 2.96 MIL/uL — AB (ref 4.22–5.81)
RDW: 16 % — AB (ref 11.5–15.5)
WBC: 6.3 10*3/uL (ref 4.0–10.5)

## 2015-10-07 LAB — APTT: APTT: 34 s (ref 24–37)

## 2015-10-07 LAB — COMPREHENSIVE METABOLIC PANEL
ALT: 30 U/L (ref 17–63)
AST: 33 U/L (ref 15–41)
Albumin: 2.5 g/dL — ABNORMAL LOW (ref 3.5–5.0)
Alkaline Phosphatase: 73 U/L (ref 38–126)
Anion gap: 7 (ref 5–15)
BUN: 22 mg/dL — AB (ref 6–20)
CHLORIDE: 104 mmol/L (ref 101–111)
CO2: 21 mmol/L — AB (ref 22–32)
CREATININE: 1.4 mg/dL — AB (ref 0.61–1.24)
Calcium: 8.4 mg/dL — ABNORMAL LOW (ref 8.9–10.3)
GFR calc non Af Amer: 51 mL/min — ABNORMAL LOW (ref >60)
GFR, EST AFRICAN AMERICAN: 59 mL/min — AB (ref >60)
Glucose, Bld: 245 mg/dL — ABNORMAL HIGH (ref 65–99)
POTASSIUM: 4.7 mmol/L (ref 3.5–5.1)
SODIUM: 132 mmol/L — AB (ref 135–145)
Total Bilirubin: 5.2 mg/dL — ABNORMAL HIGH (ref 0.3–1.2)
Total Protein: 6 g/dL — ABNORMAL LOW (ref 6.5–8.1)

## 2015-10-07 LAB — PROTIME-INR
INR: 1.63 — AB (ref 0.00–1.49)
Prothrombin Time: 19.4 seconds — ABNORMAL HIGH (ref 11.6–15.2)

## 2015-10-07 LAB — AMMONIA: AMMONIA: 181 umol/L — AB (ref 9–35)

## 2015-10-07 LAB — MRSA PCR SCREENING: MRSA BY PCR: POSITIVE — AB

## 2015-10-07 MED ORDER — CHLORHEXIDINE GLUCONATE CLOTH 2 % EX PADS
6.0000 | MEDICATED_PAD | Freq: Every day | CUTANEOUS | Status: DC
  Administered 2015-10-08 – 2015-10-09 (×2): 6 via TOPICAL

## 2015-10-07 MED ORDER — SODIUM CHLORIDE 0.9 % IV SOLN
INTRAVENOUS | Status: DC
  Administered 2015-10-07: 15:00:00 via INTRAVENOUS

## 2015-10-07 MED ORDER — ALBUMIN HUMAN 25 % IV SOLN
25.0000 g | Freq: Once | INTRAVENOUS | Status: AC
  Administered 2015-10-07: 25 g via INTRAVENOUS
  Filled 2015-10-07: qty 50

## 2015-10-07 MED ORDER — RIFAXIMIN 550 MG PO TABS
550.0000 mg | ORAL_TABLET | Freq: Two times a day (BID) | ORAL | Status: DC
Start: 2015-10-07 — End: 2015-10-09
  Administered 2015-10-07 – 2015-10-09 (×5): 550 mg via ORAL
  Filled 2015-10-07 (×4): qty 1

## 2015-10-07 MED ORDER — DEXTROSE 5 % IV SOLN
2.0000 g | INTRAVENOUS | Status: DC
  Administered 2015-10-07: 2 g via INTRAVENOUS
  Filled 2015-10-07 (×2): qty 2

## 2015-10-07 MED ORDER — ONDANSETRON HCL 4 MG PO TABS: 4.0000 mg | ORAL_TABLET | Freq: Four times a day (QID) | ORAL | Status: DC | PRN

## 2015-10-07 MED ORDER — MUPIROCIN 2 % EX OINT
1.0000 "application " | TOPICAL_OINTMENT | Freq: Two times a day (BID) | CUTANEOUS | Status: DC
  Administered 2015-10-07 – 2015-10-09 (×4): 1 via NASAL
  Filled 2015-10-07 (×2): qty 22

## 2015-10-07 MED ORDER — LACTULOSE 10 GM/15ML PO SOLN
30.0000 g | Freq: Once | ORAL | Status: AC
  Administered 2015-10-07: 30 g via ORAL
  Filled 2015-10-07: qty 45

## 2015-10-07 MED ORDER — LACTULOSE 10 GM/15ML PO SOLN
30.0000 g | Freq: Four times a day (QID) | ORAL | Status: DC
  Administered 2015-10-07 – 2015-10-09 (×8): 30 g via ORAL
  Filled 2015-10-07 (×8): qty 45

## 2015-10-07 MED ORDER — SODIUM CHLORIDE 0.9 % IV BOLUS (SEPSIS)
500.0000 mL | Freq: Once | INTRAVENOUS | Status: AC
  Administered 2015-10-07: 500 mL via INTRAVENOUS

## 2015-10-07 MED ORDER — INSULIN GLARGINE 100 UNIT/ML ~~LOC~~ SOLN
5.0000 [IU] | Freq: Every day | SUBCUTANEOUS | Status: DC
  Administered 2015-10-07 – 2015-10-08 (×2): 5 [IU] via SUBCUTANEOUS
  Filled 2015-10-07 (×3): qty 0.05

## 2015-10-07 MED ORDER — RIBAVIRIN 200 MG PO CAPS
400.0000 mg | ORAL_CAPSULE | Freq: Two times a day (BID) | ORAL | Status: DC
  Administered 2015-10-07 – 2015-10-09 (×4): 400 mg via ORAL
  Filled 2015-10-07: qty 2

## 2015-10-07 MED ORDER — SORAFENIB TOSYLATE 200 MG PO TABS
400.0000 mg | ORAL_TABLET | Freq: Two times a day (BID) | ORAL | Status: DC
  Administered 2015-10-07 – 2015-10-09 (×4): 400 mg via ORAL

## 2015-10-07 MED ORDER — ALBUMIN HUMAN 25 % IV SOLN
INTRAVENOUS | Status: AC
  Filled 2015-10-07: qty 50

## 2015-10-07 MED ORDER — TAMSULOSIN HCL 0.4 MG PO CAPS
0.4000 mg | ORAL_CAPSULE | Freq: Every day | ORAL | Status: DC
  Administered 2015-10-07 – 2015-10-08 (×2): 0.4 mg via ORAL
  Filled 2015-10-07 (×2): qty 1

## 2015-10-07 MED ORDER — ONDANSETRON HCL 4 MG/2ML IJ SOLN
4.0000 mg | Freq: Four times a day (QID) | INTRAMUSCULAR | Status: DC | PRN
Start: 2015-10-07 — End: 2015-10-09

## 2015-10-07 NOTE — ED Notes (Signed)
PER THE FAMILY, THE PT WAS GIVEN HIS DOSE OF LACTULOSE, BUT THEY ARE NOT SURE OF HIS OTHER MEDICATIONS.

## 2015-10-07 NOTE — ED Notes (Signed)
Bed: [id number] Expected date:  Expected time:  Means of arrival:  Comments: EMS- 65yo M, AMS/Hx of liver disease

## 2015-10-07 NOTE — Progress Notes (Addendum)
EDCM spoke to patient and his daughter at bedside.  Patient's daughter answered all questions as patient was sleeping.  Patient noted to have been admitted 6 times within 6 months.  Patient's most recent admission from 06/30 to 07/03 for hepatic encephalapathy secondary to medication noncompliance.  Pcp listed is Dr. Leighton Ruff.  Patient's daughter reports patient's POA is his brother Efe Bourland who lives less than a half mile away from the patient.  She reports Juanda Crumble checks on the patient everyday.  Patient's daughter reports she is unsure if the patient is taking his medications or the medications are not working for him.  Patient's daughter reports patient completes his ADL's independently.  Patient's brother Juanda Crumble gets patient's medications for him.  Patient's daughter lives in [place].  Patient's daughter reports patient has an appointment with his cancer doctor Dr. Lynnette Caffey at [facility] at the end of the month.  Patient's daughter thinks patient would benefit from rehab.  [facility] consult placed.  Patient discharged home last admission with home health services with [facility] for RN, PT, OT and aide. No further EDCM needs at this time.

## 2015-10-07 NOTE — Progress Notes (Signed)
EDCM notified [facility] rep of patient's admission.

## 2015-10-07 NOTE — ED Notes (Signed)
PT RECEIVED VIA EMS FROM HOME FOR AMS. PER THE FAMILY, WHEN THEY WENT TO VISIT THE PT THIS MORNING, HE WAS NOT ANSWERING THEIR QUESTIONS. FAMILY STATES WHEN HE ANSWERS WITH "YES OR NO" HIS AMMONIA LEVELS ARE USUALLY HIGH. PT HAS A HX OF HEP C AND LIVER CA. AAOX1.

## 2015-10-07 NOTE — ED Notes (Signed)
Condom cath placed.  Family to notify writer once specimen returned.

## 2015-10-07 NOTE — H&P (Signed)
History and Physical    Ethan Wade [name] DOB: 27-Apr-1950 DOA: 10/07/2015  PCP: Gerrit Heck, MD  Patient coming from: Home   Chief Complaint: AMS.   HPI: Ethan Wade is a 65 y.o. male with medical history significant of history of hep C cirrhosis and hepatocellular, varices, ascities, and hepatic encephalopathy carcinoma, 6 admission over last year for hepatic encephalopathy, no compliance with lactulose, who presents with AMS. Per daughter who provide history, patient was doing well until today when he became confuse, answering only yes to everything, sleepy. He has mild tenderness on palpation abdominal exam. No history of fever. Presumably he has been taking his medications.   ED Course: Presents with confusion , Ammonia level 181, cr 1.4, glucose 245, bili 5.4, platelet 75, INR 1.6, CT head negative. Chest x ray bibasilar atelectasis.   Review of Systems: Unable to obtain due to AMS>    Past Medical History  Diagnosis Date  . Macrocytosis   . Thrombocytopenia ([place])   . Esophageal bleed, non-variceal 2006  . HTN (hypertension)   . Psoriasis   . Mitral valve prolapse     OCCAS FLUTTER FEELING  . Vitamin D deficiency   . Umbilical hernia     OCCAS DISCOMFORT  . Prostate enlargement     PT TOLD "NORMAL FOR AGE" - TAKES FINASTERIDE AND FLOMAX  . Heart murmur   . Type II diabetes mellitus ([place])   . Rheumatic fever 1950's  . Hepatitis C 1980's  . Arthritis     "joint stiffness in the knees" (11/10/2013)  . Ascites   . Hepatic encephalopathy ([place]) 05/2015  . Liver cancer [facility])     Past Surgical History  Procedure Laterality Date  . Tonsilectomy, adenoidectomy, bilateral myringotomy and tubes  child  . Umbilical hernia repair  03/09/2012    Procedure: HERNIA REPAIR UMBILICAL ADULT;  Surgeon: Edward Jolly, MD;  Location: [facility];  Service: General;  Laterality: N/A;  Repair Umbilical Hernia with Mesh  . Insertion of mesh  03/09/2012   Procedure: INSERTION OF MESH;  Surgeon: Edward Jolly, MD;  Location: [facility];  Service: General;  Laterality: N/A;  . Tonsillectomy    . Hernia repair     Social history   reports that he has quit smoking. His smoking use included Cigars. He started smoking about 9 years ago. He has quit using smokeless tobacco. His smokeless tobacco use included Snuff. He reports that he drinks alcohol. He reports that he uses illicit drugs.  No Known Allergies  Family History  Problem Relation Age of Onset  . Prostate cancer Brother   . COPD Mother   . Heart disease Father   . Diabetes Father   . Alcohol abuse Father   . Liver cancer Brother   . Colon cancer Brother   . COPD Maternal Grandfather      Prior to Admission medications   Medication Sig Start Date End Date Taking? Authorizing Provider  furosemide (LASIX) 40 MG tablet Take 0.5 tablets (20 mg total) by mouth daily. Patient taking differently: Take 40 mg by mouth daily.  09/30/15  Yes Jani Gravel, MD  lactulose (CHRONULAC) 10 GM/15ML solution Take 45 mLs (30 g total) by mouth 3 (three) times daily. 02/25/15  Yes Reyne Dumas, MD  SORAfenib (NEXAVAR) 200 MG tablet Take 400 mg by mouth every 12 (twelve) hours. Take on an empty stomach 1 hour before or 2 hours after meals.   Yes Historical Provider, MD  spironolactone (ALDACTONE) 100  MG tablet Take 100 mg by mouth daily.   Yes Historical Provider, MD  XIFAXAN 550 MG TABS tablet Take 550 mg by mouth 2 (two) times daily. 05/17/15  Yes Historical Provider, MD  zolpidem (AMBIEN) 5 MG tablet Take 5 mg by mouth at bedtime.   Yes Historical Provider, MD  Carboxymethylcellul-Glycerin (CLEAR EYES FOR DRY EYES) 1-0.25 % SOLN Apply 1-2 drops to eye daily as needed (for dry eyes). Reported on 05/20/2015    Historical Provider, MD  dicyclomine (BENTYL) 10 MG capsule Take 1 capsule (10 mg total) by mouth 2 (two) times daily. 11/13/13   Ripudeep Krystal Eaton, MD  feeding supplement (BOOST / RESOURCE BREEZE) LIQD  Take 1 Container by mouth 3 (three) times daily between meals. 09/30/15   Jani Gravel, MD  feeding supplement, ENSURE ENLIVE, (ENSURE ENLIVE) LIQD Take 237 mLs by mouth 2 (two) times daily between meals. 09/30/15   Jani Gravel, MD  insulin glargine (LANTUS) 100 UNIT/ML injection Inject 0.08 mLs (8 Units total) into the skin daily. Patient taking differently: Inject 14 Units into the skin daily.  04/04/14   Shanker Kristeen Mans, MD  nadolol (CORGARD) 20 MG tablet Take 20 mg by mouth daily.    Historical Provider, MD  oxyCODONE (OXY IR/ROXICODONE) 5 MG immediate release tablet Take 1 tablet (5 mg total) by mouth every 12 (twelve) hours as needed for severe pain or breakthrough pain (only for severe or breakthrough pain). 11/13/13   Ripudeep Krystal Eaton, MD  RIBASPHERE 200 MG tablet Take 400 mg by mouth 2 (two) times daily.  08/19/15   Historical Provider, MD  Tamsulosin HCl (FLOMAX) 0.4 MG CAPS Take 0.4 mg by mouth daily.    Historical Provider, MD    Physical Exam: Filed Vitals:   10/07/15 1249 10/07/15 1300 10/07/15 1446 10/07/15 1530  BP: 94/79 97/78 87/68  99/69  Pulse: 72 74 72 65  Temp:      TempSrc:      Resp: 15 21 18 18   Height:      Weight:      SpO2: 100% 100% 97% 98%      Constitutional: itcteric, lethargic, only answer yes to everything  Filed Vitals:   10/07/15 1249 10/07/15 1300 10/07/15 1446 10/07/15 1530  BP: 94/79 97/78 87/68  99/69  Pulse: 72 74 72 65  Temp:      TempSrc:      Resp: 15 21 18 18   Height:      Weight:      SpO2: 100% 100% 97% 98%   Eyes: PERRL, lids normal , icteric conjunctivae  ENMT: Mucous membranes are moist. Posterior pharynx clear of any exudate or lesions.Normal dentition.  Neck: normal, supple, no masses, no thyromegaly Respiratory: clear to auscultation bilaterally, no wheezing, no crackles. Normal respiratory effort. No accessory muscle use.  Cardiovascular: Regular rate and rhythm, no murmurs / rubs / gallops. No extremity edema. 2+ pedal pulses. No  carotid bruits.  Abdomen: no tenderness, no masses palpated. No hepatosplenomegaly. Bowel sounds positive.  Musculoskeletal: no clubbing / cyanosis. No joint deformity upper and lower extremities. Good ROM, no contractures. Normal muscle tone.  Skin: no rashes, lesions, ulcers. No induration Neurologic: open eyes, to voice, moves extremities passively, answer yes to everything  Psychiatric: unable to evaluate due to AMS    Labs on Admission: I have personally reviewed following labs and imaging studies  CBC:  Recent Labs Lab 10/07/15 1353  WBC 6.3  NEUTROABS 5.0  HGB 11.6*  HCT 34.3*  MCV 115.9*  PLT 75*   Basic Metabolic Panel:  Recent Labs Lab 10/07/15 1353  NA 132*  K 4.7  CL 104  CO2 21*  GLUCOSE 245*  BUN 22*  CREATININE 1.40*  CALCIUM 8.4*   GFR: Estimated Creatinine Clearance: 44.9 mL/min (by C-G formula based on Cr of 1.4). Liver Function Tests:  Recent Labs Lab 10/07/15 1353  AST 33  ALT 30  ALKPHOS 73  BILITOT 5.2*  PROT 6.0*  ALBUMIN 2.5*   No results for input(s): LIPASE, AMYLASE in the last 168 hours.  Recent Labs Lab 10/07/15 1354  AMMONIA 181*   Coagulation Profile:  Recent Labs Lab 10/07/15 1353  INR 1.63*   Cardiac Enzymes: No results for input(s): CKTOTAL, CKMB, CKMBINDEX, TROPONINI in the last 168 hours. BNP (last 3 results) No results for input(s): PROBNP in the last 8760 hours. HbA1C: No results for input(s): HGBA1C in the last 72 hours. CBG: No results for input(s): GLUCAP in the last 168 hours. Lipid Profile: No results for input(s): CHOL, HDL, LDLCALC, TRIG, CHOLHDL, LDLDIRECT in the last 72 hours. Thyroid Function Tests: No results for input(s): TSH, T4TOTAL, FREET4, T3FREE, THYROIDAB in the last 72 hours. Anemia Panel: No results for input(s): VITAMINB12, FOLATE, FERRITIN, TIBC, IRON, RETICCTPCT in the last 72 hours. Urine analysis:    Component Value Date/Time   COLORURINE RED* 09/28/2015 0623   APPEARANCEUR  CLOUDY* 09/28/2015 0623   LABSPEC 1.029 09/28/2015 0623   PHURINE 5.0 09/28/2015 0623   GLUCOSEU 250* 09/28/2015 0623   HGBUR LARGE* 09/28/2015 0623   BILIRUBINUR MODERATE* 09/28/2015 0623   KETONESUR 15* 09/28/2015 0623   PROTEINUR NEGATIVE 09/28/2015 0623   UROBILINOGEN 2.0* 08/17/2014 0421   NITRITE POSITIVE* 09/28/2015 0623   LEUKOCYTESUR SMALL* 09/28/2015 0623   Sepsis Labs: !!!!!!!!!!!!!!!!!!!!!!!!!!!!!!!!!!!!!!!!!!!! @LABRCNTIP (procalcitonin:4,lacticidven:4) )No results found for this or any previous visit (from the past 240 hour(s)).   Radiological Exams on Admission: Ct Head Wo Contrast  10/07/2015  CLINICAL DATA:  Altered mental status. History of hepatitis-C and liver cancer. EXAM: CT HEAD WITHOUT CONTRAST TECHNIQUE: Contiguous axial images were obtained from the base of the skull through the vertex without intravenous contrast. COMPARISON:  Head CT dated 09/27/2015. FINDINGS: Brain: There is stable generalized atrophy with commensurate dilatation of the ventricles and sulci. There is no mass, hemorrhage, edema or other evidence of acute parenchymal abnormality. No extra-axial hemorrhage. No hydrocephalus. Vascular: No hyperdense vessel or unexpected calcification. There are chronic calcified atherosclerotic changes of the large vessels at the skull base. Skull: Negative for fracture or focal lesion. Sinuses/Orbits: No acute findings. Visualized upper paranasal sinuses are clear. Other: None. IMPRESSION: Negative head CT.  No intracranial mass, hemorrhage or edema. Electronically Signed   By: Franki Cabot M.D.   On: 10/07/2015 14:21   Dg Chest Port 1 View  10/07/2015  CLINICAL DATA:  Altered mental status.  History of liver carcinoma EXAM: PORTABLE CHEST 1 VIEW COMPARISON:  September 27, 2015 FINDINGS: There is patchy atelectasis in lung bases. Lungs elsewhere clear. Heart size and pulmonary vascularity are normal. No adenopathy. There is calcification in the thoracic aorta. No bone  lesions evident. IMPRESSION: Bibasilar atelectasis. No edema or consolidation. No adenopathy evident. Aortic atherosclerosis. Electronically Signed   By: Lowella Grip III M.D.   On: 10/07/2015 13:30    EKG: Independently reviewed. Sinus rhythm, low voltage criteria.   Assessment/Plan Active Problems:   Thrombocytopenia (HCC)   Chronic hepatitis C with cirrhosis (HCC)   Acute encephalopathy   Controlled  type 2 DM with peripheral circulatory disorder (HCC)   Hypotension   Encephalopathy acute  1-Acute hepatic encephalopathy/  Presents with ammonia level 181.  IV fluids, Albumin.  Lactulose every 6 hours.  Continue with rifaximin.  Repeat ammonia level in am.  Hold opioids.   2-Cirrhosis, hepatocellular carcinoma, hepatitis C;  Lactulose, rifamin.  Resume Nexavar, Ribasphere/  Follow at [facility].   3-Hypotension;  IV fluids, cover for SBP with IV ceftriaxone.  IV albumin.   4-AKI; suspect related to hypovolemia; IV fluids. Repeat labs in am.   5-DM;  Hold lantus. SII.  6-Chronic Thrombocytopenia. Stable, monitor.     DVT prophylaxis:  Code Status: SCD.  Family Communication: Brother Jakie Bielak who is patient 13 POA, daughter, updated about plan of care. Palliative care consulted for goals of care, prognosis, discussion Code status with family and when patient is bale to participate .  Disposition Plan: to be determine  Consults called: none Admission status: inpatient , step down unit    Niel Hummer A MD [facility] Pager [phone]  If 7PM-7AM, please contact night-coverage [email or web address] Password [facility]  10/07/2015, 4:28 PM

## 2015-10-07 NOTE — ED Provider Notes (Addendum)
CSN: [id number]     Arrival date & time 10/07/15  1158 History   First MD Initiated Contact with Patient 10/07/15 1215     Chief Complaint  Patient presents with  . Altered Mental Status   HPI Pt has history of hep C cirrhosis and hepatocellular carcinoma.   Pt has history of varices, ascities, and hepatic encephalopathy.  Last evening pt was in his usual state of health.  He is currently living at home and family checks on him and [facility] comes to check on him but not daily. TOday the patient was confused.  He would only answer yes and no.  This is similar to how he acts when his ammonia level is high.  Pt has had several visits for the same issue requiring hospitalization.  Family is concerned that he does not take his medication properly.  Pt has refused nursing facility care previously.   Past Medical History  Diagnosis Date  . Macrocytosis   . Thrombocytopenia ([place])   . Esophageal bleed, non-variceal 2006  . HTN (hypertension)   . Psoriasis   . Mitral valve prolapse     OCCAS FLUTTER FEELING  . Vitamin D deficiency   . Umbilical hernia     OCCAS DISCOMFORT  . Prostate enlargement     PT TOLD "NORMAL FOR AGE" - TAKES FINASTERIDE AND FLOMAX  . Heart murmur   . Type II diabetes mellitus ([place])   . Rheumatic fever 1950's  . Hepatitis C 1980's  . Arthritis     "joint stiffness in the knees" (11/10/2013)  . Ascites   . Hepatic encephalopathy ([place]) 05/2015  . Liver cancer [facility])    Past Surgical History  Procedure Laterality Date  . Tonsilectomy, adenoidectomy, bilateral myringotomy and tubes  child  . Umbilical hernia repair  03/09/2012    Procedure: HERNIA REPAIR UMBILICAL ADULT;  Surgeon: Edward Jolly, MD;  Location: [facility];  Service: General;  Laterality: N/A;  Repair Umbilical Hernia with Mesh  . Insertion of mesh  03/09/2012    Procedure: INSERTION OF MESH;  Surgeon: Edward Jolly, MD;  Location: [facility];  Service: General;  Laterality: N/A;  .  Tonsillectomy    . Hernia repair     Family History  Problem Relation Age of Onset  . Prostate cancer Brother   . COPD Mother   . Heart disease Father   . Diabetes Father   . Alcohol abuse Father   . Liver cancer Brother   . Colon cancer Brother   . COPD Maternal Grandfather    Social History  Substance Use Topics  . Smoking status: Former Smoker -- 20 years    Types: Cigars    Start date: 12/22/2005  . Smokeless tobacco: Former Systems developer    Types: Snuff     Comment: "quit smoking ~ 2009; quit snuff in ~ 2013"  . Alcohol Use: Yes     Comment: "quit in late April 2015"    Review of Systems  Constitutional: Negative for fever.  Gastrointestinal: Negative for nausea, vomiting and diarrhea.  Neurological: Negative for headaches.       No fall or injury   All other systems reviewed and are negative.     Allergies  Review of patient's allergies indicates no known allergies.  Home Medications   Prior to Admission medications   Medication Sig Start Date End Date Taking? Authorizing Provider  furosemide (LASIX) 40 MG tablet Take 0.5 tablets (20 mg total) by  mouth daily. Patient taking differently: Take 40 mg by mouth daily.  09/30/15  Yes Jani Gravel, MD  lactulose (CHRONULAC) 10 GM/15ML solution Take 45 mLs (30 g total) by mouth 3 (three) times daily. 02/25/15  Yes Reyne Dumas, MD  SORAfenib (NEXAVAR) 200 MG tablet Take 400 mg by mouth every 12 (twelve) hours. Take on an empty stomach 1 hour before or 2 hours after meals.   Yes Historical Provider, MD  spironolactone (ALDACTONE) 100 MG tablet Take 100 mg by mouth daily.   Yes Historical Provider, MD  XIFAXAN 550 MG TABS tablet Take 550 mg by mouth 2 (two) times daily. 05/17/15  Yes Historical Provider, MD  zolpidem (AMBIEN) 5 MG tablet Take 5 mg by mouth at bedtime.   Yes Historical Provider, MD  Carboxymethylcellul-Glycerin (CLEAR EYES FOR DRY EYES) 1-0.25 % SOLN Apply 1-2 drops to eye daily as needed (for dry eyes). Reported on  05/20/2015    Historical Provider, MD  dicyclomine (BENTYL) 10 MG capsule Take 1 capsule (10 mg total) by mouth 2 (two) times daily. 11/13/13   Ripudeep Krystal Eaton, MD  feeding supplement (BOOST / RESOURCE BREEZE) LIQD Take 1 Container by mouth 3 (three) times daily between meals. 09/30/15   Jani Gravel, MD  feeding supplement, ENSURE ENLIVE, (ENSURE ENLIVE) LIQD Take 237 mLs by mouth 2 (two) times daily between meals. 09/30/15   Jani Gravel, MD  insulin glargine (LANTUS) 100 UNIT/ML injection Inject 0.08 mLs (8 Units total) into the skin daily. Patient taking differently: Inject 14 Units into the skin daily.  04/04/14   Shanker Kristeen Mans, MD  nadolol (CORGARD) 20 MG tablet Take 20 mg by mouth daily.    Historical Provider, MD  oxyCODONE (OXY IR/ROXICODONE) 5 MG immediate release tablet Take 1 tablet (5 mg total) by mouth every 12 (twelve) hours as needed for severe pain or breakthrough pain (only for severe or breakthrough pain). 11/13/13   Ripudeep Krystal Eaton, MD  RIBASPHERE 200 MG tablet Take 400 mg by mouth 2 (two) times daily.  08/19/15   Historical Provider, MD  Tamsulosin HCl (FLOMAX) 0.4 MG CAPS Take 0.4 mg by mouth daily.    Historical Provider, MD   BP 94/79 mmHg  Pulse 72  Temp(Src) 98.5 F (36.9 C) (Oral)  Resp 15  Ht 5\' 9"  (1.753 m)  Wt 61.236 kg  BMI 19.93 kg/m2  SpO2 100% Physical Exam  Constitutional: No distress.  HENT:  Head: Normocephalic and atraumatic.  Right Ear: External ear normal.  Left Ear: External ear normal.  Eyes: Conjunctivae are normal. Right eye exhibits no discharge. Left eye exhibits no discharge. No scleral icterus.  Neck: Neck supple. No tracheal deviation present.  Cardiovascular: Normal rate, regular rhythm and intact distal pulses.   Pulmonary/Chest: Effort normal and breath sounds normal. No stridor. No respiratory distress. He has no wheezes. He has no rales.  Abdominal: Soft. Bowel sounds are normal. He exhibits no distension. There is no tenderness. There is no  rebound and no guarding.  Musculoskeletal: He exhibits no edema or tenderness.  Neurological: No cranial nerve deficit (no facial droop, extraocular movements intact, no slurred speech) or sensory deficit. He exhibits normal muscle tone. He displays no seizure activity. Coordination normal. GCS eye subscore is 4. GCS verbal subscore is 4. GCS motor subscore is 5.  The patient will answer questions inappropriately, he will answer yes or no to all questions even when that would not be an appropriate answer, does not follow commands, sitting there  with his eyes open  Skin: Skin is warm and dry. No rash noted. He is not diaphoretic.  Psychiatric: He has a normal mood and affect.  Nursing note and vitals reviewed.   ED Course  Procedures (including critical care time) Labs Review Labs Reviewed  COMPREHENSIVE METABOLIC PANEL - Abnormal; Notable for the following:    Sodium 132 (*)    CO2 21 (*)    Glucose, Bld 245 (*)    BUN 22 (*)    Creatinine, Ser 1.40 (*)    Calcium 8.4 (*)    Total Protein 6.0 (*)    Albumin 2.5 (*)    Total Bilirubin 5.2 (*)    GFR calc non Af Amer 51 (*)    GFR calc Af Amer 59 (*)    All other components within normal limits  PROTIME-INR - Abnormal; Notable for the following:    Prothrombin Time 19.4 (*)    INR 1.63 (*)    All other components within normal limits  AMMONIA - Abnormal; Notable for the following:    Ammonia 181 (*)    All other components within normal limits  APTT  CBC WITH DIFFERENTIAL/PLATELET  URINALYSIS, ROUTINE W REFLEX MICROSCOPIC (NOT AT [facility])    Imaging Review Ct Head Wo Contrast  10/07/2015  CLINICAL DATA:  Altered mental status. History of hepatitis-C and liver cancer. EXAM: CT HEAD WITHOUT CONTRAST TECHNIQUE: Contiguous axial images were obtained from the base of the skull through the vertex without intravenous contrast. COMPARISON:  Head CT dated 09/27/2015. FINDINGS: Brain: There is stable generalized atrophy with commensurate  dilatation of the ventricles and sulci. There is no mass, hemorrhage, edema or other evidence of acute parenchymal abnormality. No extra-axial hemorrhage. No hydrocephalus. Vascular: No hyperdense vessel or unexpected calcification. There are chronic calcified atherosclerotic changes of the large vessels at the skull base. Skull: Negative for fracture or focal lesion. Sinuses/Orbits: No acute findings. Visualized upper paranasal sinuses are clear. Other: None. IMPRESSION: Negative head CT.  No intracranial mass, hemorrhage or edema. Electronically Signed   By: Franki Cabot M.D.   On: 10/07/2015 14:21   Dg Chest Port 1 View  10/07/2015  CLINICAL DATA:  Altered mental status.  History of liver carcinoma EXAM: PORTABLE CHEST 1 VIEW COMPARISON:  September 27, 2015 FINDINGS: There is patchy atelectasis in lung bases. Lungs elsewhere clear. Heart size and pulmonary vascularity are normal. No adenopathy. There is calcification in the thoracic aorta. No bone lesions evident. IMPRESSION: Bibasilar atelectasis. No edema or consolidation. No adenopathy evident. Aortic atherosclerosis. Electronically Signed   By: Lowella Grip III M.D.   On: 10/07/2015 13:30   I have personally reviewed and evaluated these images and lab results as part of my medical decision-making.   EKG Interpretation   Date/Time:  Monday October 07 2015 13:04:21 EDT Ventricular Rate:  73 PR Interval:    QRS Duration: 105 QT Interval:  444 QTC Calculation: 490 R Axis:     Text Interpretation:  Sinus rhythm Low voltage, extremity and precordial  leads , voltage decreased from prior ECG Probable anteroseptal infarct,  old Confirmed by Messi Twedt  MD-J, Kemya Shed [id number]) on 10/07/2015 1:24:16 PM     Medications given in the ED Medications  0.9 %  sodium chloride infusion ( Intravenous New Bag/Given 10/07/15 1448)  lactulose (CHRONULAC) 10 GM/15ML solution 30 g (30 g Oral Given 10/07/15 1455)     MDM   Final diagnoses:  Acute hepatic  encephalopathy [facility])    Patient  presents to the emergency room with recurrent hepatic encephalopathy. Patient is now had several episodes recently.  This is his third visit since June 24 for hepatic encephalopathy.  These recurrent episodes have presumed to be related to medical noncompliance. The patient has refused any assisted living or nursing home placement in the past. Family tries to help him with his medications but they wonder if he has been taking them.   I have ordered lactulose.  Will consult with the medical service for hospitalization and further treatment.  Dorie Rank, MD 10/07/15 1500  Low BP noted.   Pt previously has had BPs in the 80s and 90s on July 2nd.  Doubt acute infection/sepsis.  Normal wbc.  No fever.  No source of infection.  Dorie Rank, MD 10/07/15 [phone]

## 2015-10-08 DIAGNOSIS — Z515 Encounter for palliative care: Secondary | ICD-10-CM | POA: Insufficient documentation

## 2015-10-08 DIAGNOSIS — Z7189 Other specified counseling: Secondary | ICD-10-CM | POA: Insufficient documentation

## 2015-10-08 DIAGNOSIS — K7200 Acute and subacute hepatic failure without coma: Principal | ICD-10-CM

## 2015-10-08 LAB — COMPREHENSIVE METABOLIC PANEL
ALT: 28 U/L (ref 17–63)
ANION GAP: 8 (ref 5–15)
AST: 34 U/L (ref 15–41)
Albumin: 2.8 g/dL — ABNORMAL LOW (ref 3.5–5.0)
Alkaline Phosphatase: 68 U/L (ref 38–126)
BILIRUBIN TOTAL: 3.8 mg/dL — AB (ref 0.3–1.2)
BUN: 22 mg/dL — ABNORMAL HIGH (ref 6–20)
CO2: 20 mmol/L — ABNORMAL LOW (ref 22–32)
Calcium: 8.3 mg/dL — ABNORMAL LOW (ref 8.9–10.3)
Chloride: 106 mmol/L (ref 101–111)
Creatinine, Ser: 1.17 mg/dL (ref 0.61–1.24)
Glucose, Bld: 193 mg/dL — ABNORMAL HIGH (ref 65–99)
POTASSIUM: 4.3 mmol/L (ref 3.5–5.1)
Sodium: 134 mmol/L — ABNORMAL LOW (ref 135–145)
TOTAL PROTEIN: 6.1 g/dL — AB (ref 6.5–8.1)

## 2015-10-08 LAB — URINALYSIS, ROUTINE W REFLEX MICROSCOPIC
Glucose, UA: 100 mg/dL — AB
Ketones, ur: 15 mg/dL — AB
Nitrite: POSITIVE — AB
PH: 5.5 (ref 5.0–8.0)
Protein, ur: 100 mg/dL — AB
SPECIFIC GRAVITY, URINE: 1.03 (ref 1.005–1.030)

## 2015-10-08 LAB — CBC
HEMATOCRIT: 34.5 % — AB (ref 39.0–52.0)
HEMOGLOBIN: 11.6 g/dL — AB (ref 13.0–17.0)
MCH: 39.3 pg — ABNORMAL HIGH (ref 26.0–34.0)
MCHC: 33.6 g/dL (ref 30.0–36.0)
MCV: 116.9 fL — ABNORMAL HIGH (ref 78.0–100.0)
Platelets: 74 10*3/uL — ABNORMAL LOW (ref 150–400)
RBC: 2.95 MIL/uL — ABNORMAL LOW (ref 4.22–5.81)
RDW: 16.3 % — ABNORMAL HIGH (ref 11.5–15.5)
WBC: 6.3 10*3/uL (ref 4.0–10.5)

## 2015-10-08 LAB — GLUCOSE, CAPILLARY
GLUCOSE-CAPILLARY: 202 mg/dL — AB (ref 65–99)
GLUCOSE-CAPILLARY: 264 mg/dL — AB (ref 65–99)
Glucose-Capillary: 186 mg/dL — ABNORMAL HIGH (ref 65–99)
Glucose-Capillary: 259 mg/dL — ABNORMAL HIGH (ref 65–99)

## 2015-10-08 LAB — URINE MICROSCOPIC-ADD ON

## 2015-10-08 LAB — AMMONIA: Ammonia: 120 umol/L — ABNORMAL HIGH (ref 9–35)

## 2015-10-08 MED ORDER — BOOST / RESOURCE BREEZE PO LIQD
1.0000 | Freq: Three times a day (TID) | ORAL | Status: DC
  Administered 2015-10-08 – 2015-10-09 (×4): 1 via ORAL

## 2015-10-08 MED ORDER — ADULT MULTIVITAMIN LIQUID CH
15.0000 mL | Freq: Every day | ORAL | Status: DC
  Administered 2015-10-08 – 2015-10-09 (×2): 15 mL via ORAL
  Filled 2015-10-08 (×2): qty 15

## 2015-10-08 MED ORDER — SODIUM CHLORIDE 0.9 % IV SOLN
INTRAVENOUS | Status: DC
  Administered 2015-10-08 – 2015-10-09 (×3): via INTRAVENOUS

## 2015-10-08 NOTE — Progress Notes (Signed)
Patient was active with [facility] for PT/OT/RN prior to admission

## 2015-10-08 NOTE — Progress Notes (Signed)
Initial Nutrition Assessment  DOCUMENTATION CODES:   Severe malnutrition in context of chronic illness  INTERVENTION:  - Will order Boost Breeze TID, each supplement provides 250 kcal and 9 grams of protein. - Will order daily liquid multivitamin. - Diet advancement as medically feasible. - Continue to encourage PO intakes of meals and ordered supplements.  - RD will continue to monitor for additional needs.  NUTRITION DIAGNOSIS:   Inadequate protein intake related to other (see comment) (current diet order) as evidenced by other (see comment) (CLD does not meet estimated protein needs.).  GOAL:   Patient will meet greater than or equal to 90% of their needs  MONITOR:   PO intake, Supplement acceptance, Diet advancement, Weight trends, Labs, I & O's  REASON FOR ASSESSMENT:   Malnutrition Screening Tool  ASSESSMENT:   65 y.o. male with medical history significant of history of hep C cirrhosis and hepatocellular, varices, ascities, and hepatic encephalopathy carcinoma, 6 admission over last year for hepatic encephalopathy, no compliance with lactulose, who presents with AMS. Per daughter who provide history, patient was doing well until today when he became confuse, answering only yes to everything, sleepy. He has mild tenderness on palpation abdominal exam. No history of fever. Presumably he has been taking his medications.   Pt seen for MST. BMI indicates normal weight. Pt on CLD since last night related to AMS and PMH with documentation of 0% completion of breakfast this AM. Pt currently sleeping with notes indicating AMS/confusion is ongoing; no family/visitors at bedside at this time.  Did not perform physical assessment with respect to pt's comfort while he was sleeping by physical assessment was performed by another RD on 09/29/15 and documentation indicates severe fat and muscle wasting was noted to upper and lower body at that time. Per chart review, pt has gained 19 lbs since  09/27/15. Prior to this weight gain, pt had lost 9 lbs (6% body weight) from 06/08/15-09/27/15 which is not significant for time frame.   Will order supplements as outlined above and monitor for additional needs with diet advancement. Not meeting needs at this time. Medications reviewed; 5 units Lantus at bedside each day, PRN Zofran.  Labs reviewed; CBG: 202 mg/dL this AM, Na: 134 mmol/L, BUN: 22 mg/dL, Ca: 8.3 mg/dL, ammonia: 120 umol/L.   Diet Order:  Diet clear liquid Room service appropriate?: Yes; Fluid consistency:: Thin  Skin:  Reviewed, no issues  Last BM:  7/11  Height:   Ht Readings from Last 1 Encounters:  10/07/15 5\' 10"  (1.778 m)    Weight:   Wt Readings from Last 1 Encounters:  10/08/15 156 lb 15.5 oz (71.2 kg)    Ideal Body Weight:  75.45 kg (kg)  BMI:  Body mass index is 22.52 kg/(m^2).  Estimated Nutritional Needs:   Kcal:  1800-2000  Protein:  85-95 grams  Fluid:  1.5 L/day  EDUCATION NEEDS:   No education needs identified at this time     Jarome Matin, MS, RD, LDN Inpatient Clinical Dietitian Pager # [phone] After hours/weekend pager # [phone]

## 2015-10-08 NOTE — Consult Note (Signed)
Consultation Note Date: 10/08/2015   Patient Name: Ethan Wade  DOB: [birth year]  MRN: [id number]  Age / Sex: 65 y.o., male  PCP: Ethan Ruff, MD Referring Physician: Donne Hazel, MD  Reason for Consultation: Establishing goals of care  HPI/Patient Profile: 65 y.o. male  admitted on 10/07/2015   Clinical Assessment and Goals of Care:  Ethan Wade is a 65 y.o.. male with hepatocellular carcinoma, Hep C,  who is Under the care of Ethan Wade at [place], [place]. Patient has been placed on Nexavar since 2015. Recently the dose was increased. He had a follow-up appointment with his oncologist later this month. Recently, he has had a recurrent hospitalizations for hepatic encephalopathy. It is reported that he has noncompliance with lactulose. Patient currently admitted to hospitalist service, he has been placed on diuretics. He is currently in stepdown unit. Palliative care consultation for goals of care discussions has been placed.  Patient is older than stated age appearing gentleman resting in bed. He answers yes to pretty much all questions. Call placed and discussed with healthcare power of attorney agent brother Ethan Wade at [phone]. Patient's brother and daughter arrived at the hospital this afternoon.  I introduced palliative care as follows: Palliative medicine is specialized medical care for people living with serious illness. It focuses on providing relief from the symptoms and stress of a serious illness. The goal is to improve quality of life for both the patient and the family.     Brief life review performed. Patient has had gradual progressive decline for the last 6 months. He is largely bedbound or chair bound. He does not like taking his medications. Family reports that he has had ongoing decline in his oral intake. He has begun discussions with them about his impending death. See  discussions below. Thank you for the consult.   Ethan Wade [phone].   SUMMARY OF RECOMMENDATIONS    Initial discussions regarding DNR DNI, choosing a more palliative/comfort based approach to care discussed with brother and daughter, follow up meeting planned for 10-09-15 at 9:30 AM.   Code Status/Advance Care Planning:  Full code    Symptom Management:    continue current treatment.   Palliative Prophylaxis:   Bowel Regimen   Psycho-social/Spiritual:   Desire for further Chaplaincy support:no  Additional Recommendations: Education on Hospice  Prognosis:   Guarded given ongoing decline, serious illness.   Discharge Planning: Home with Hospice is recommended, will continue conversations with family for final disposition.       Primary Diagnoses: Present on Admission:  . Hypotension . Thrombocytopenia (Ethan Wade) . Chronic hepatitis C with cirrhosis (Ethan Wade) . Acute encephalopathy . Controlled type 2 DM with peripheral circulatory disorder (Ethan Wade) . Encephalopathy acute  I have reviewed the medical record, interviewed the patient and family, and examined the patient. The following aspects are pertinent.  Past Medical History  Diagnosis Date  . Macrocytosis   . Thrombocytopenia (Ethan Wade)   . Esophageal bleed, non-variceal 2006  . HTN (hypertension)   .  Psoriasis   . Mitral valve prolapse     OCCAS FLUTTER FEELING  . Vitamin D deficiency   . Umbilical hernia     OCCAS DISCOMFORT  . Prostate enlargement     PT TOLD "NORMAL FOR AGE" - TAKES FINASTERIDE AND FLOMAX  . Heart murmur   . Type II diabetes mellitus (Ethan Wade)   . Rheumatic fever 1950's  . Hepatitis C 1980's  . Arthritis     "joint stiffness in the knees" (11/10/2013)  . Ascites   . Hepatic encephalopathy (Ethan Wade) 05/2015  . Liver cancer Ethan Wade)    Social History   Social History  . Marital Status: Single    Spouse Name: N/A  . Number of Children: N/A  . Years of Education: N/A   Social  History Main Topics  . Smoking status: Former Smoker -- 20 years    Types: Cigars    Start date: 12/22/2005  . Smokeless tobacco: Former Systems developer    Types: Snuff     Comment: "quit smoking ~ 2009; quit snuff in ~ 2013"  . Alcohol Use: Yes     Comment: "quit in late April 2015"  . 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 Sexual Activity: No   Other Topics Concern  . None   Social History Narrative   Family History  Problem Relation Age of Onset  . Prostate cancer Brother   . COPD Mother   . Heart disease Father   . Diabetes Father   . Alcohol abuse Father   . Liver cancer Brother   . Colon cancer Brother   . COPD Maternal Grandfather    Scheduled Meds: . Chlorhexidine Gluconate Cloth  6 each Topical Q0600  . feeding supplement  1 Container Oral TID BM  . insulin glargine  5 Units Subcutaneous QHS  . lactulose  30 g Oral Q6H  . multivitamin  15 mL Oral Daily  . mupirocin ointment  1 application Nasal BID  . ribavirin  400 mg Oral BID  . rifaximin  550 mg Oral BID  . SORAfenib  400 mg Oral Q12H  . tamsulosin  0.4 mg Oral QHS   Continuous Infusions: . sodium chloride 75 mL/hr at 10/08/15 1056   PRN Meds:.ondansetron **OR** ondansetron (ZOFRAN) IV Medications Prior to Admission:  Prior to Admission medications   Medication Sig Start Date End Date Taking? Authorizing Provider  Carboxymethylcellul-Glycerin (CLEAR EYES FOR DRY EYES) 1-0.25 % SOLN Apply 1-2 drops to eye daily as needed (for dry eyes). Reported on 05/20/2015   Yes Historical Provider, MD  dicyclomine (BENTYL) 10 MG capsule Take 1 capsule (10 mg total) by mouth 2 (two) times daily. 11/13/13  Yes Ripudeep Krystal Eaton, MD  feeding supplement, ENSURE ENLIVE, (ENSURE ENLIVE) LIQD Take 237 mLs by mouth 2 (two) times daily between meals. 09/30/15  Yes Jani Gravel, MD  furosemide (LASIX) 40 MG tablet Take 0.5 tablets (20 mg total) by mouth daily. Patient taking  differently: Take 40 mg by mouth daily.  09/30/15  Yes Jani Gravel, MD  insulin glargine (LANTUS) 100 UNIT/ML injection Inject 0.08 mLs (8 Units total) into the skin daily. Patient taking differently: Inject 14 Units into the skin daily.  04/04/14  Yes Shanker Kristeen Mans, MD  lactulose (CHRONULAC) 10 GM/15ML solution Take 45 mLs (30 g total) by mouth 3 (three) times daily. Patient taking differently: Take 20 g by mouth 3 (three) times  daily.  02/25/15  Yes Reyne Dumas, MD  nadolol (CORGARD) 20 MG tablet Take 20 mg by mouth daily.   Yes Historical Provider, MD  oxyCODONE (OXY IR/ROXICODONE) 5 MG immediate release tablet Take 1 tablet (5 mg total) by mouth every 12 (twelve) hours as needed for severe pain or breakthrough pain (only for severe or breakthrough pain). 11/13/13  Yes Ripudeep K Rai, MD  RIBASPHERE 200 MG tablet Take 400 mg by mouth 2 (two) times daily.  08/19/15  Yes Historical Provider, MD  SORAfenib (NEXAVAR) 200 MG tablet Take 400 mg by mouth every 12 (twelve) hours. Take on an empty stomach 1 hour before or 2 hours after meals.   Yes Historical Provider, MD  spironolactone (ALDACTONE) 100 MG tablet Take 100 mg by mouth daily.   Yes Historical Provider, MD  Tamsulosin HCl (FLOMAX) 0.4 MG CAPS Take 0.4 mg by mouth daily.   Yes Historical Provider, MD  XIFAXAN 550 MG TABS tablet Take 550 mg by mouth 2 (two) times daily. 05/17/15  Yes Historical Provider, MD  zolpidem (AMBIEN) 5 MG tablet Take 5 mg by mouth at bedtime.   Yes Historical Provider, MD  feeding supplement (BOOST / RESOURCE BREEZE) LIQD Take 1 Container by mouth 3 (three) times daily between meals. Patient not taking: Reported on 10/07/2015 09/30/15   Jani Gravel, MD   No Known Allergies Review of Systems + for confusion.   Physical Exam Confused Weak appearing Pale, ?mildly icteric in appearance S1 S2 Abdomen soft distended Trace edema Diminished bases  Vital Signs: BP 134/79 mmHg  Pulse 66  Temp(Src) 98 F (36.7 C) (Oral)   Resp 17  Ht 5\' 10"  (1.778 m)  Wt 71.2 kg (156 lb 15.5 oz)  BMI 22.52 kg/m2  SpO2 100% Pain Assessment: No/denies pain       SpO2: SpO2: 100 % O2 Device:SpO2: 100 % O2 Flow Rate: .   IO: Intake/output summary:  Intake/Output Summary (Last 24 hours) at 10/08/15 1622 Last data filed at 10/08/15 1500  Gross per 24 hour  Intake   1075 ml  Output    390 ml  Net    685 ml    LBM: Last BM Date: 10/08/15 Baseline Weight: Weight: 61.236 kg (135 lb) Most recent weight: Weight: 71.2 kg (156 lb 15.5 oz)     Palliative Assessment/Data:   Flowsheet Rows        Most Recent Value   Intake Tab    Referral Department  Hospitalist   Unit at Time of Referral  Intermediate Care Unit   Palliative Care Primary Diagnosis  Cancer   Palliative Care Type  New Palliative care   Reason for referral  Clarify Goals of Care   Date first seen by Palliative Care  10/08/15   Clinical Assessment    Palliative Performance Scale Score  20%   Pain Max last 24 hours  3   Pain Min Last 24 hours  2   Psychosocial & Spiritual Assessment    Palliative Care Outcomes    Patient/Family meeting held?  Yes   Who was at the meeting?  brother daughter    Palliative Care follow-up planned  Yes, Home      Time In: 1500 Time Out: 1600 Time Total: 60 min  Greater than 50%  of this time was spent counseling and coordinating care related to the above assessment and plan.  Signed by: Loistine Chance, MD  [phone]  Please contact Palliative Medicine Team phone at  [phone] for questions and concerns.  For individual provider: See Shea Evans

## 2015-10-08 NOTE — Progress Notes (Signed)
Dr Wyline Copas notifed of low urine output and poor oral intake. Orders received

## 2015-10-08 NOTE — Progress Notes (Signed)
PROGRESS NOTE    Ethan Wade  [id number] DOB: 1950-06-13 DOA: 10/07/2015 PCP: Gerrit Heck, MD   Brief Narrative: 65 y.o. male with medical history significant of history of hep C cirrhosis and hepatocellular, varices, ascities, and hepatic encephalopathy carcinoma, 6 admission over last year for hepatic encephalopathy, no compliance with lactulose, who presents with AMS. Per daughter who provide history, patient was doing well until today when he became confuse, answering only yes to everything, sleepy. He has mild tenderness on palpation abdominal exam. No history of fever. Presumably he has been taking his medications   Assessment & Plan:   Active Problems:   Thrombocytopenia (HCC)   Chronic hepatitis C with cirrhosis (HCC)   Acute encephalopathy   Controlled type 2 DM with peripheral circulatory disorder (HCC)   Hypotension   Encephalopathy acute   1-Acute hepatic encephalopathy/  Presents with ammonia level 181.  IV fluids, Albumin.  Continue lactulose every 6 hours.  Continue with rifaximin.  Ammonia levels slowly trending down Patient still encephalopathic this morning Holding opioids.   2-Cirrhosis, hepatocellular carcinoma, hepatitis C;  Lactulose, rifamin to ensure at least 2-3 bowel movements per day.  Resume Nexavar, Ribasphere/  Follow[facility] at [facility].  3-Hypotension;  IV fluids as tolerated IV albumin.  Patient afebrile with no leukocytosis. Will hold further antibiotics at this time  4-AKI; suspect related to hypovolemia; IV fluids. Repeat labs in am.   5-DM; Hold lantus. SII.  6-Chronic Thrombocytopenia. Stable, monitor.    DVT prophylaxis: SCDs Code Status: Full Family Communication: Patient in room, family not at bedside Disposition Plan: Uncertain at this time   Consultants:     Procedures:     Antimicrobials:  Anti-infectives    Start     Dose/Rate Route Frequency Ordered Stop   10/07/15 2200  rifaximin (XIFAXAN)  tablet 550 mg     550 mg Oral 2 times daily 10/07/15 1910     10/07/15 2200  ribavirin (COPEGUS) tablet 400 mg     400 mg Oral 2 times daily 10/07/15 1641     10/07/15 1800  cefTRIAXone (ROCEPHIN) 2 g in dextrose 5 % 50 mL IVPB  Status:  Discontinued     2 g 100 mL/hr over 30 Minutes Intravenous Every 24 hours 10/07/15 1621 10/08/15 1549           Subjective: Difficult to assess, patient still confused  Objective: Filed Vitals:   10/08/15 0800 10/08/15 1000 10/08/15 1200 10/08/15 1400  BP: 109/74 102/74 115/82 134/79  Pulse:      Temp: 97.7 F (36.5 C)  98 F (36.7 C)   TempSrc: Oral  Oral   Resp: 17 15 17 17   Height:      Weight:      SpO2: 99% 99% 100% 100%    Intake/Output Summary (Last 24 hours) at 10/08/15 1546 Last data filed at 10/08/15 1500  Gross per 24 hour  Intake   1200 ml  Output    390 ml  Net    810 ml   Filed Weights   10/07/15 1700 10/08/15 0000 10/08/15 0500  Weight: 69.9 kg (154 lb 1.6 oz) 71.1 kg (156 lb 12 oz) 71.2 kg (156 lb 15.5 oz)    Examination:  General exam: Appears calm and comfortable, Remains confused  Respiratory system: Clear to auscultation. Respiratory effort normal. Cardiovascular system: S1 & S2 heard, RRR. Gastrointestinal system: Abdomen is nondistended, soft and nontender. No organomegaly or masses felt. Normal bowel sounds heard. Central nervous system: Alert  and oriented. No focal neurological deficits. Extremities: Symmetric 5 x 5 power. Skin: No rashes, lesions Psychiatry: Patient confused, difficult to assess.     Data Reviewed: I have personally reviewed following labs and imaging studies  CBC:  Recent Labs Lab 10/07/15 1353 10/08/15 0308  WBC 6.3 6.3  NEUTROABS 5.0  --   HGB 11.6* 11.6*  HCT 34.3* 34.5*  MCV 115.9* 116.9*  PLT 75* 74*   Basic Metabolic Panel:  Recent Labs Lab 10/07/15 1353 10/08/15 0308  NA 132* 134*  K 4.7 4.3  CL 104 106  CO2 21* 20*  GLUCOSE 245* 193*  BUN 22* 22*    CREATININE 1.40* 1.17  CALCIUM 8.4* 8.3*   GFR: Estimated Creatinine Clearance: 62.5 mL/min (by C-G formula based on Cr of 1.17). Liver Function Tests:  Recent Labs Lab 10/07/15 1353 10/08/15 0308  AST 33 34  ALT 30 28  ALKPHOS 73 68  BILITOT 5.2* 3.8*  PROT 6.0* 6.1*  ALBUMIN 2.5* 2.8*   No results for input(s): LIPASE, AMYLASE in the last 168 hours.  Recent Labs Lab 10/07/15 1354 10/08/15 0308  AMMONIA 181* 120*   Coagulation Profile:  Recent Labs Lab 10/07/15 1353  INR 1.63*   Cardiac Enzymes: No results for input(s): CKTOTAL, CKMB, CKMBINDEX, TROPONINI in the last 168 hours. BNP (last 3 results) No results for input(s): PROBNP in the last 8760 hours. HbA1C: No results for input(s): HGBA1C in the last 72 hours. CBG:  Recent Labs Lab 10/08/15 0818 10/08/15 1217  GLUCAP 202* 186*   Lipid Profile: No results for input(s): CHOL, HDL, LDLCALC, TRIG, CHOLHDL, LDLDIRECT in the last 72 hours. Thyroid Function Tests: No results for input(s): TSH, T4TOTAL, FREET4, T3FREE, THYROIDAB in the last 72 hours. Anemia Panel: No results for input(s): VITAMINB12, FOLATE, FERRITIN, TIBC, IRON, RETICCTPCT in the last 72 hours. Sepsis Labs: No results for input(s): PROCALCITON, LATICACIDVEN in the last 168 hours.  Recent Results (from the past 240 hour(s))  MRSA PCR Screening     Status: Abnormal   Collection Time: 10/07/15  4:40 PM  Result Value Ref Range Status   MRSA by PCR POSITIVE (A) NEGATIVE Final    Comment:        The GeneXpert MRSA Assay (FDA approved for NASAL specimens only), is one component of a comprehensive MRSA colonization surveillance program. It is not intended to diagnose MRSA infection nor to guide or monitor treatment for MRSA infections. RESULT CALLED TO, READ BACK BY AND VERIFIED WITH: PAT MICKLEY,RN [id number] @ [id number] BY J SCOTTON          Radiology Studies: Ct Head Wo Contrast  10/07/2015  CLINICAL DATA:  Altered mental status.  History of hepatitis-C and liver cancer. EXAM: CT HEAD WITHOUT CONTRAST TECHNIQUE: Contiguous axial images were obtained from the base of the skull through the vertex without intravenous contrast. COMPARISON:  Head CT dated 09/27/2015. FINDINGS: Brain: There is stable generalized atrophy with commensurate dilatation of the ventricles and sulci. There is no mass, hemorrhage, edema or other evidence of acute parenchymal abnormality. No extra-axial hemorrhage. No hydrocephalus. Vascular: No hyperdense vessel or unexpected calcification. There are chronic calcified atherosclerotic changes of the large vessels at the skull base. Skull: Negative for fracture or focal lesion. Sinuses/Orbits: No acute findings. Visualized upper paranasal sinuses are clear. Other: None. IMPRESSION: Negative head CT.  No intracranial mass, hemorrhage or edema. Electronically Signed   By: Franki Cabot M.D.   On: 10/07/2015 14:21   Dg Chest Port 1  View  10/07/2015  CLINICAL DATA:  Altered mental status.  History of liver carcinoma EXAM: PORTABLE CHEST 1 VIEW COMPARISON:  September 27, 2015 FINDINGS: There is patchy atelectasis in lung bases. Lungs elsewhere clear. Heart size and pulmonary vascularity are normal. No adenopathy. There is calcification in the thoracic aorta. No bone lesions evident. IMPRESSION: Bibasilar atelectasis. No edema or consolidation. No adenopathy evident. Aortic atherosclerosis. Electronically Signed   By: Lowella Grip III M.D.   On: 10/07/2015 13:30        Scheduled Meds: . cefTRIAXone (ROCEPHIN)  IV  2 g Intravenous Q24H  . Chlorhexidine Gluconate Cloth  6 each Topical Q0600  . feeding supplement  1 Container Oral TID BM  . insulin glargine  5 Units Subcutaneous QHS  . lactulose  30 g Oral Q6H  . multivitamin  15 mL Oral Daily  . mupirocin ointment  1 application Nasal BID  . ribavirin  400 mg Oral BID  . rifaximin  550 mg Oral BID  . SORAfenib  400 mg Oral Q12H  . tamsulosin  0.4 mg Oral QHS    Continuous Infusions: . sodium chloride 75 mL/hr at 10/08/15 1056     LOS: 1 day     CHIU, Orpah Melter, MD [facility] Pager [phone]  If 7PM-7AM, please contact night-coverage [email or web address] Password [facility] 10/08/2015, 3:46 PM

## 2015-10-09 DIAGNOSIS — E1151 Type 2 diabetes mellitus with diabetic peripheral angiopathy without gangrene: Secondary | ICD-10-CM

## 2015-10-09 DIAGNOSIS — D696 Thrombocytopenia, unspecified: Secondary | ICD-10-CM

## 2015-10-09 DIAGNOSIS — B182 Chronic viral hepatitis C: Secondary | ICD-10-CM

## 2015-10-09 DIAGNOSIS — G9340 Encephalopathy, unspecified: Secondary | ICD-10-CM

## 2015-10-09 DIAGNOSIS — K7460 Unspecified cirrhosis of liver: Secondary | ICD-10-CM

## 2015-10-09 DIAGNOSIS — K7682 Hepatic encephalopathy: Secondary | ICD-10-CM

## 2015-10-09 DIAGNOSIS — K769 Liver disease, unspecified: Secondary | ICD-10-CM

## 2015-10-09 DIAGNOSIS — E43 Unspecified severe protein-calorie malnutrition: Secondary | ICD-10-CM | POA: Insufficient documentation

## 2015-10-09 DIAGNOSIS — K7290 Hepatic failure, unspecified without coma: Secondary | ICD-10-CM

## 2015-10-09 DIAGNOSIS — E440 Moderate protein-calorie malnutrition: Secondary | ICD-10-CM

## 2015-10-09 LAB — CBC
HEMATOCRIT: 34.5 % — AB (ref 39.0–52.0)
Hemoglobin: 11.3 g/dL — ABNORMAL LOW (ref 13.0–17.0)
MCH: 39 pg — AB (ref 26.0–34.0)
MCHC: 32.8 g/dL (ref 30.0–36.0)
MCV: 119 fL — AB (ref 78.0–100.0)
PLATELETS: 67 10*3/uL — AB (ref 150–400)
RBC: 2.9 MIL/uL — AB (ref 4.22–5.81)
RDW: 16.5 % — ABNORMAL HIGH (ref 11.5–15.5)
WBC: 5.9 10*3/uL (ref 4.0–10.5)

## 2015-10-09 LAB — GLUCOSE, CAPILLARY
GLUCOSE-CAPILLARY: 381 mg/dL — AB (ref 65–99)
Glucose-Capillary: 341 mg/dL — ABNORMAL HIGH (ref 65–99)
Glucose-Capillary: 410 mg/dL — ABNORMAL HIGH (ref 65–99)

## 2015-10-09 LAB — COMPREHENSIVE METABOLIC PANEL
ALT: 26 U/L (ref 17–63)
AST: 31 U/L (ref 15–41)
Albumin: 2.6 g/dL — ABNORMAL LOW (ref 3.5–5.0)
Alkaline Phosphatase: 68 U/L (ref 38–126)
Anion gap: 5 (ref 5–15)
BUN: 27 mg/dL — ABNORMAL HIGH (ref 6–20)
CHLORIDE: 108 mmol/L (ref 101–111)
CO2: 23 mmol/L (ref 22–32)
CREATININE: 1.22 mg/dL (ref 0.61–1.24)
Calcium: 8.4 mg/dL — ABNORMAL LOW (ref 8.9–10.3)
GFR calc non Af Amer: >60 mL/min (ref >60)
Glucose, Bld: 317 mg/dL — ABNORMAL HIGH (ref 65–99)
POTASSIUM: 4.8 mmol/L (ref 3.5–5.1)
SODIUM: 136 mmol/L (ref 135–145)
Total Bilirubin: 3.6 mg/dL — ABNORMAL HIGH (ref 0.3–1.2)
Total Protein: 5.8 g/dL — ABNORMAL LOW (ref 6.5–8.1)

## 2015-10-09 LAB — AMMONIA: Ammonia: 23 umol/L (ref 9–35)

## 2015-10-09 MED ORDER — ZOLPIDEM TARTRATE 5 MG PO TABS
5.0000 mg | ORAL_TABLET | Freq: Every evening | ORAL | Status: DC | PRN
  Administered 2015-10-09: 5 mg via ORAL
  Filled 2015-10-09: qty 1

## 2015-10-09 MED ORDER — FUROSEMIDE 40 MG PO TABS: 40.0000 mg | ORAL_TABLET | Freq: Every day | ORAL | Status: DC

## 2015-10-09 MED ORDER — INSULIN ASPART 100 UNIT/ML ~~LOC~~ SOLN
0.0000 [IU] | Freq: Three times a day (TID) | SUBCUTANEOUS | Status: DC
Start: 2015-10-09 — End: 2015-10-09
  Administered 2015-10-09: 15 [IU] via SUBCUTANEOUS

## 2015-10-09 MED ORDER — LACTULOSE 10 GM/15ML PO SOLN: 30.0000 g | Freq: Three times a day (TID) | ORAL | Status: DC

## 2015-10-09 MED ORDER — INSULIN GLARGINE 100 UNIT/ML ~~LOC~~ SOLN
10.0000 [IU] | Freq: Every day | SUBCUTANEOUS | Status: DC
  Administered 2015-10-09: 10 [IU] via SUBCUTANEOUS
  Filled 2015-10-09: qty 0.1

## 2015-10-09 NOTE — Progress Notes (Signed)
Daily Progress Note   Patient Name: Ethan Wade       Date: 10/09/2015 DOB: 03/11/1951  Age: 65 y.o. MRN#: [id number] Attending Physician: Debbe Odea, MD Primary Care Physician: Gerrit Heck, MD Admit Date: 10/07/2015  Reason for Consultation/Follow-up: Establishing goals of care  Subjective:  awake alert Ammonia level normalized See discussions below Discussed with Dr Lemont Fillers of Stay: 2  Current Medications: Scheduled Meds:  . Chlorhexidine Gluconate Cloth  6 each Topical Q0600  . feeding supplement  1 Container Oral TID BM  . insulin glargine  5 Units Subcutaneous QHS  . lactulose  30 g Oral Q6H  . multivitamin  15 mL Oral Daily  . mupirocin ointment  1 application Nasal BID  . ribavirin  400 mg Oral BID  . rifaximin  550 mg Oral BID  . SORAfenib  400 mg Oral Q12H  . tamsulosin  0.4 mg Oral QHS    Continuous Infusions: . sodium chloride 75 mL/hr at 10/09/15 1022    PRN Meds: ondansetron **OR** ondansetron (ZOFRAN) IV, zolpidem  Physical Exam         Awake alert Oriented Clear Abdomen mildly distended No edema Muscle wasting evident Non focal  Vital Signs: BP 106/87 mmHg  Pulse 66  Temp(Src) 97.7 F (36.5 C) (Oral)  Resp 18  Ht 5\' 10"  (1.778 m)  Wt 70.8 kg (156 lb 1.4 oz)  BMI 22.40 kg/m2  SpO2 100% SpO2: SpO2: 100 % O2 Device: O2 Device: Not Delivered O2 Flow Rate:    Intake/output summary:  Intake/Output Summary (Last 24 hours) at 10/09/15 1030 Last data filed at 10/09/15 0800  Gross per 24 hour  Intake   1590 ml  Output    195 ml  Net   1395 ml   LBM: Last BM Date: 10/09/15 Baseline Weight: Weight: 61.236 kg (135 lb) Most recent weight: Weight: 70.8 kg (156 lb 1.4 oz)       Palliative Assessment/Data:    Flowsheet  Rows        Most Recent Value   Intake Tab    Referral Department  Hospitalist   Unit at Time of Referral  Intermediate Care Unit   Palliative Care Primary Diagnosis  Cancer   Date Notified  10/07/15   Palliative Care Type  Return patient Palliative Care  Reason for referral  Clarify Goals of Care   Date of Admission  10/07/15   Date first seen by Palliative Care  10/08/15   # of days Palliative referral response time  1 Day(s)   # of days IP prior to Palliative referral  0   Clinical Assessment    Palliative Performance Scale Score  30%   Pain Max last 24 hours  4   Pain Min Last 24 hours  3   Dyspnea Max Last 24 Hours  3   Dyspnea Min Last 24 hours  2   Psychosocial & Spiritual Assessment    Palliative Care Outcomes    Patient/Family meeting held?  Yes   Who was at the meeting?  patient daughter 2 brothers   Palliative Care Outcomes  -- [reviewed living will and HCPOA documents. ]   Palliative Care follow-up planned  Yes, Home      Patient Active Problem List   Diagnosis Date Noted  . Protein-calorie malnutrition, severe 10/09/2015  . Encounter for palliative care   . Goals of care, counseling/discussion   . Hypotension 10/07/2015  . Encephalopathy acute 10/07/2015  . Hyperkalemia 09/27/2015  . Acute hepatic encephalopathy ([place]) 09/21/2015  . Altered mental status   . Mild dehydration   . Hepatocellular carcinoma ([place])   . Controlled type 2 DM with peripheral circulatory disorder ([place])   . Acute encephalopathy 05/08/2015  . Pressure ulcer 02/24/2015  . Chronic liver disease and cirrhosis ([place])   . Diabetes type 2, controlled ([place])   . Increased ammonia level   . Hepatic encephalopathy ([place]) 04/01/2014  . Liver cancer ([place]) 04/01/2014  . Elevated troponin 04/01/2014  . Malnutrition of moderate degree ([place]) 11/13/2013  . PVT (portal vein thrombosis) 11/10/2013  . Thrombocytopenia ([place])   . 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 Esophageal bleed,  non-variceal   . HTN (hypertension)   . Psoriasis   . Mitral valve prolapse   . Arthritis   . Prostate enlargement     Palliative Care Assessment & Plan   Patient Profile:    Assessment:  acute hepatic encephalopathy Medication non compliance Deconditioning hepato cellular carcinoma Thrombocytopenia.    Recommendations/Plan:    Family meeting with patient, 2 brothers including HCPOA brother Juanda Crumble, daughter Donella Stade. Living will and HCPOA documents also reviewed. Patient remarkably awake alert oriented. Is able to answer all questions appropriately.    Briefly discussed with the patient regarding his serious illness of liver disease, his non compliance with his medications. He states that the lactulose that he gets at home is different from the lactulose that he gets here in the hospital. Goals and wishes discussed in detail. Palliative/ comfort based approach to care discussed in detail.   At this time, patient wishes to be a FULL code, he promises to be more compliant with his medications, his goals are to continue any and all life maintaining and life prolonging therapies. He states he currently has a quality of life that is worth maintaining and preserving for as long as possible. He has an appointment coming up with his oncologist at [facility] later this month.   Validated the patient's wishes and encouraged him to continue taking his medications and follow up with his oncologist at [facility]. Palliative care will remain involved, follow his disease trajectory.   All of the patient's and family's questions answered to the best of my ability.   Goals of Care and Additional Recommendations:  Limitations on Scope of Treatment:  Full Scope Treatment  Code Status:    Code Status Orders        Start     Ordered   10/07/15 1911  Full code   Continuous     10/07/15 1910    Code Status History    Date Active Date Inactive Code Status Order ID Comments User Context   09/27/2015   7:14 PM 09/30/2015  5:19 PM Full Code [id number]  Louellen Molder, MD Inpatient   09/21/2015  2:58 PM 09/23/2015  6:40 PM Full Code [id number]  Radene Gunning, NP ED   06/08/2015  1:38 PM 06/10/2015  6:26 PM Full Code [id number]  Rondel Jumbo, PA-C ED   05/20/2015  2:40 PM 05/22/2015  3:03 PM Full Code [id number]  Leana Gamer, MD ED   05/08/2015  8:20 PM 05/10/2015  4:29 PM Full Code [id number]  Cristal Ford, DO Inpatient   02/22/2015 11:14 PM 02/25/2015  3:45 PM Full Code [id number]  Theressa Millard, MD Inpatient   08/17/2014 12:17 AM 08/18/2014  6:01 PM Full Code [id number]  Ivor Costa, MD ED   04/02/2014 12:56 AM 04/04/2014  3:22 PM Full Code [id number]  Ivor Costa, MD Inpatient   11/10/2013  3:50 PM 11/13/2013  4:25 PM Full Code [id number]  Janece Canterbury, MD Inpatient       Prognosis:   guarded given serious illness   Discharge Planning:  Home with [place] was discussed with    Thank you for allowing the Palliative Medicine Team to assist in the care of this patient.   Time In:  930 Time Out: 1030 Total Time 60 Prolonged Time Billed  no       Greater than 50%  of this time was spent counseling and coordinating care related to the above assessment and plan.  Loistine Chance, MD [phone]  Please contact Palliative Medicine Team phone at [phone] for questions and concerns.

## 2015-10-09 NOTE — Progress Notes (Signed)
Skin tear to left lower arm. Pt tried pulling himself up in the bed and tore the skin to the lower aspect of his left arm. Site cleansed with saline, dried with gauze and tegaderm dsg applied. VWilliams,rn.

## 2015-10-09 NOTE — Progress Notes (Signed)
Pt active with [facility] and will be followed at discharge. In house rep informed pt was discharging today.

## 2015-10-09 NOTE — Progress Notes (Signed)
Inpatient Diabetes Program Recommendations  AACE/ADA: New Consensus Statement on Inpatient Glycemic Control (2015)  Target Ranges:  Prepandial:   less than 140 mg/dL      Peak postprandial:   less than 180 mg/dL (1-2 hours)      Critically ill patients:  140 - 180 mg/dL   Lab Results  Component Value Date   GLUCAP 341* 10/09/2015   HGBA1C 5.5 09/21/2015  Results for LENORRIS, RUTT (MRN [id number]) as of 10/09/2015 10:00  Ref. Range 10/08/2015 08:18 10/08/2015 12:17 10/08/2015 17:47 10/08/2015 21:26 10/09/2015 07:49  Glucose-Capillary Latest Ref Range: 65-99 mg/dL 202 (H) 186 (H) 259 (H) 264 (H) 341 (H)     Review of Glycemic Control  Diabetes history: DM2 Outpatient Diabetes medications: Lantus 14 units QD Current orders for Inpatient glycemic control: Lantus 5 units QHS  Inpatient Diabetes Program Recommendations:   Increase Lantus to 10 units QHS Add Novolog sensitive tidwc and hs May need meal coverage insulin if po intake increases  Continue to follow. Thank you. Lorenda Peck, RD, LDN, CDE Inpatient Diabetes Coordinator [phone]

## 2015-10-09 NOTE — Progress Notes (Signed)
Gave pt meds back to pt per policy.  Paperwork placed in chart.  Irven Baltimore, RN

## 2015-10-09 NOTE — Discharge Summary (Signed)
Physician Discharge Summary  Roxana Peavey [id number] DOB: 09/01/1950 DOA: 10/07/2015  PCP: Gerrit Heck, MD  Admit date: 10/07/2015 Discharge date: 10/09/2015  Time spent: 55 minutes  Recommendations for Outpatient Follow-up:  1. Have had a long discussed with brother and daughter at someone must administer his medications and ensure that he is taking enough lactulose to have 3 BMs daily. If he is not taking or being administered his medications appropriately, will need to consider someone moving in to live with patient, SNF or hospice home  Discharge Condition: stable    Discharge Diagnoses:  Principal Problem:   Encephalopathy, hepatic due to medication non-compliance  - resolved with Lactulose - multiple admissions for the same - appears that he is not taking his meds appropriately at home although he claims to be - have discussed with brother and daughter that they need to administer his medications to prevent recurrence- in addition I have explained that he needs to have a minimum of 3 BMs a day and Lactulose should be increased until this goal is reached.  - cont Xifaxin - Home health RN will check on him tomorrow - palliative care consult called due to repeated admissions for the same issue- patient declines palliative care and states he will do his best to take medications as ordered  Active Problems: Cirrhosis, hepatocellular carcinoma, hepatitis C, thrombocytopenia, ascites  Resume Nexavar, Ribasphere, diuretics- explained the need for daily weights.  Followed at [facility].  Hypotension/dehydration IV fluids, IV albumin.  - has resolved- diuretics resumed on discharge    DM - resume home meds    Protein-calorie malnutrition, severe - diet discussed with patient and family  Consultations:  Palliative care  Discharge Exam: Filed Weights   10/08/15 0000 10/08/15 0500 10/09/15 0500  Weight: 71.1 kg (156 lb 12 oz) 71.2 kg (156 lb 15.5 oz) 70.8 kg (156  lb 1.4 oz)   Filed Vitals:   10/09/15 0800 10/09/15 1200  BP: 106/87   Pulse:    Temp: 97.7 F (36.5 C) 97.5 F (36.4 C)  Resp: 18     General: AAO x 3, no distress Cardiovascular: RRR, no murmurs  Respiratory: clear to auscultation bilaterally GI: soft, non-tender, non-distended, bowel sound positive  Discharge Instructions You were cared for by a hospitalist during your hospital stay. If you have any questions about your discharge medications or the care you received while you were in the hospital after you are discharged, you can call the unit and asked to speak with the hospitalist on call if the hospitalist that took care of you is not available. Once you are discharged, your primary care physician will handle any further medical issues. Please note that NO REFILLS for any discharge medications will be authorized once you are discharged, as it is imperative that you return to your primary care physician (or establish a relationship with a primary care physician if you do not have one) for your aftercare needs so that they can reassess your need for medications and monitor your lab values.      Discharge Instructions    Diet - low sodium heart healthy    Complete by:  As directed      Discharge instructions    Complete by:  As directed   Check your sugars 3 times a day prior to meal     Increase activity slowly    Complete by:  As directed             Medication List  TAKE these medications        CLEAR EYES FOR DRY EYES 1-0.25 % Soln  Generic drug:  Carboxymethylcellul-Glycerin  Apply 1-2 drops to eye daily as needed (for dry eyes). Reported on 05/20/2015     dicyclomine 10 MG capsule  Commonly known as:  BENTYL  Take 1 capsule (10 mg total) by mouth 2 (two) times daily.     feeding supplement (ENSURE ENLIVE) Liqd  Take 237 mLs by mouth 2 (two) times daily between meals.     furosemide 40 MG tablet  Commonly known as:  LASIX  Take 1 tablet (40 mg total) by  mouth daily.     insulin glargine 100 UNIT/ML injection  Commonly known as:  LANTUS  Inject 0.08 mLs (8 Units total) into the skin daily.     lactulose 10 GM/15ML solution  Commonly known as:  CHRONULAC  Take 45 mLs (30 g total) by mouth 3 (three) times daily.     nadolol 20 MG tablet  Commonly known as:  CORGARD  Take 20 mg by mouth daily.     oxyCODONE 5 MG immediate release tablet  Commonly known as:  Oxy IR/ROXICODONE  Take 1 tablet (5 mg total) by mouth every 12 (twelve) hours as needed for severe pain or breakthrough pain (only for severe or breakthrough pain).     RIBASPHERE 200 MG tablet  Generic drug:  ribavirin  Take 400 mg by mouth 2 (two) times daily.     SORAfenib 200 MG tablet  Commonly known as:  NEXAVAR  Take 400 mg by mouth every 12 (twelve) hours. Take on an empty stomach 1 hour before or 2 hours after meals.     spironolactone 100 MG tablet  Commonly known as:  ALDACTONE  Take 100 mg by mouth daily.     tamsulosin 0.4 MG Caps capsule  Commonly known as:  FLOMAX  Take 0.4 mg by mouth daily.     XIFAXAN 550 MG Tabs tablet  Generic drug:  rifaximin  Take 550 mg by mouth 2 (two) times daily.     zolpidem 5 MG tablet  Commonly known as:  AMBIEN  Take 5 mg by mouth at bedtime.       No Known Allergies    The results of significant diagnostics from this hospitalization (including imaging, microbiology, ancillary and laboratory) are listed below for reference.    Significant Diagnostic Studies: Dg Chest 2 View  09/27/2015  CLINICAL DATA:  Acute mental status change. History of liver carcinoma. EXAM: CHEST  2 VIEW COMPARISON:  September 21, 2015 FINDINGS: Mildly tortuous thoracic aorta. The heart, hila, mediastinum, lungs, and pleura are unchanged with no acute abnormality. IMPRESSION: No active cardiopulmonary disease. Electronically Signed   By: Dorise Bullion III M.D   On: 09/27/2015 14:48   Ct Head Wo Contrast  10/07/2015  CLINICAL DATA:  Altered  mental status. History of hepatitis-C and liver cancer. EXAM: CT HEAD WITHOUT CONTRAST TECHNIQUE: Contiguous axial images were obtained from the base of the skull through the vertex without intravenous contrast. COMPARISON:  Head CT dated 09/27/2015. FINDINGS: Brain: There is stable generalized atrophy with commensurate dilatation of the ventricles and sulci. There is no mass, hemorrhage, edema or other evidence of acute parenchymal abnormality. No extra-axial hemorrhage. No hydrocephalus. Vascular: No hyperdense vessel or unexpected calcification. There are chronic calcified atherosclerotic changes of the large vessels at the skull base. Skull: Negative for fracture or focal lesion. Sinuses/Orbits: No acute findings. Visualized upper paranasal  sinuses are clear. Other: None. IMPRESSION: Negative head CT.  No intracranial mass, hemorrhage or edema. Electronically Signed   By: Franki Cabot M.D.   On: 10/07/2015 14:21   Ct Head Wo Contrast  09/27/2015  CLINICAL DATA:  Altered mental status. EXAM: CT HEAD WITHOUT CONTRAST TECHNIQUE: Contiguous axial images were obtained from the base of the skull through the vertex without intravenous contrast. COMPARISON:  CT scan of April 01, 2014. FINDINGS: Bony calvarium appears intact. Mild diffuse cortical atrophy is noted. Mild chronic ischemic white matter disease is noted. No mass effect or midline shift is noted. Ventricular size is within normal limits. There is no evidence of mass lesion, hemorrhage or acute infarction. IMPRESSION: Mild diffuse cortical atrophy. Mild chronic ischemic white matter disease. No acute intracranial abnormality seen. Electronically Signed   By: Marijo Conception, M.D.   On: 09/27/2015 14:54   Dg Chest Port 1 View  10/07/2015  CLINICAL DATA:  Altered mental status.  History of liver carcinoma EXAM: PORTABLE CHEST 1 VIEW COMPARISON:  September 27, 2015 FINDINGS: There is patchy atelectasis in lung bases. Lungs elsewhere clear. Heart size and  pulmonary vascularity are normal. No adenopathy. There is calcification in the thoracic aorta. No bone lesions evident. IMPRESSION: Bibasilar atelectasis. No edema or consolidation. No adenopathy evident. Aortic atherosclerosis. Electronically Signed   By: Lowella Grip III M.D.   On: 10/07/2015 13:30   Dg Chest Port 1 View  09/21/2015  CLINICAL DATA:  Encephalopathy EXAM: PORTABLE CHEST 1 VIEW COMPARISON:  05/08/2015 FINDINGS: Normal mediastinum and cardiac silhouette. Normal pulmonary vasculature. No evidence of effusion, infiltrate, or pneumothorax. No acute bony abnormality. IMPRESSION: No acute cardiopulmonary process. Electronically Signed   By: Suzy Bouchard M.D.   On: 09/21/2015 15:59    Microbiology: Recent Results (from the past 240 hour(s))  MRSA PCR Screening     Status: Abnormal   Collection Time: 10/07/15  4:40 PM  Result Value Ref Range Status   MRSA by PCR POSITIVE (A) NEGATIVE Final    Comment:        The GeneXpert MRSA Assay (FDA approved for NASAL specimens only), is one component of a comprehensive MRSA colonization surveillance program. It is not intended to diagnose MRSA infection nor to guide or monitor treatment for MRSA infections. RESULT CALLED TO, READ BACK BY AND VERIFIED WITH: PAT MICKLEY,RN [id number] @ [place]: Basic Metabolic Panel:  Recent Labs Lab 10/07/15 1353 10/08/15 0308 10/09/15 0316  NA 132* 134* 136  K 4.7 4.3 4.8  CL 104 106 108  CO2 21* 20* 23  GLUCOSE 245* 193* 317*  BUN 22* 22* 27*  CREATININE 1.40* 1.17 1.22  CALCIUM 8.4* 8.3* 8.4*   Liver Function Tests:  Recent Labs Lab 10/07/15 1353 10/08/15 0308 10/09/15 0316  AST 33 34 31  ALT 30 28 26   ALKPHOS 73 68 68  BILITOT 5.2* 3.8* 3.6*  PROT 6.0* 6.1* 5.8*  ALBUMIN 2.5* 2.8* 2.6*   No results for input(s): LIPASE, AMYLASE in the last 168 hours.  Recent Labs Lab 10/07/15 1354 10/08/15 0308 10/09/15 0316  AMMONIA 181* 120* 23    CBC:  Recent Labs Lab 10/07/15 1353 10/08/15 0308 10/09/15 0316  WBC 6.3 6.3 5.9  NEUTROABS 5.0  --   --   HGB 11.6* 11.6* 11.3*  HCT 34.3* 34.5* 34.5*  MCV 115.9* 116.9* 119.0*  PLT 75* 74* 67*   Cardiac Enzymes: No results for input(s): CKTOTAL,  CKMB, CKMBINDEX, TROPONINI in the last 168 hours. BNP: BNP (last 3 results) No results for input(s): BNP in the last 8760 hours.  ProBNP (last 3 results) No results for input(s): PROBNP in the last 8760 hours.  CBG:  Recent Labs Lab 10/08/15 1217 10/08/15 1747 10/08/15 2126 10/09/15 0749 10/09/15 1145  GLUCAP 186* 259* 264* 341* 410*       SignedDebbe Odea, MD [facility] 10/09/2015, 3:53 PM

## 2015-10-09 NOTE — Progress Notes (Signed)
Pt glucose at 1145 was 410.  Informed Dr. Wynelle Cleveland.  See orders.  Irven Baltimore, RN

## 2015-10-14 ENCOUNTER — Other Ambulatory Visit (HOSPITAL_COMMUNITY): Payer: Self-pay | Admitting: Gastroenterology

## 2015-10-14 DIAGNOSIS — R140 Abdominal distension (gaseous): Secondary | ICD-10-CM

## 2015-10-22 ENCOUNTER — Ambulatory Visit (HOSPITAL_COMMUNITY)
Admission: RE | Admit: 2015-10-22 | Discharge: 2015-10-22 | Disposition: A | Discharge status: Home / Self Care | Payer: Medicare Other | Source: Ambulatory Visit | Attending: Gastroenterology | Admitting: Gastroenterology

## 2015-10-22 ENCOUNTER — Other Ambulatory Visit (HOSPITAL_COMMUNITY): Payer: Self-pay | Admitting: Gastroenterology

## 2015-10-22 ENCOUNTER — Encounter (HOSPITAL_COMMUNITY): Admission: RE | Admit: 2015-10-22 | Payer: Medicare Other | Source: Ambulatory Visit

## 2015-10-22 DIAGNOSIS — R140 Abdominal distension (gaseous): Secondary | ICD-10-CM

## 2015-10-22 DIAGNOSIS — R188 Other ascites: Secondary | ICD-10-CM | POA: Insufficient documentation

## 2015-10-22 NOTE — Progress Notes (Signed)
Patient ID: Ethan Wade, male   DOB: 04/25/1950, 65 y.o.   MRN: [id number] Patient presented to ultrasound department today for therapeutic paracentesis. On limited ultrasound of abdomen in all 4 quadrants there is only a trace amount of ascites in the right upper quadrant. Procedure was canceled. Patient was notified.

## 2015-10-25 ENCOUNTER — Other Ambulatory Visit: Payer: Self-pay | Admitting: *Deleted

## 2015-10-25 NOTE — Patient Outreach (Signed)
[facility] Care Management follow up call made to Mr. Wadley to offer [place] Management services. Mr. Degenhardt asks "who are you and what do you want?" Writer proceeded with explaining [place] Management program services. Mr. Catania states "I have [place] for that". He declines [facility] Care Management. No further discussion had.  Marthenia Rolling, MSN-Ed, RN,BSN [facility] Liaison [phone]

## 2015-11-15 ENCOUNTER — Inpatient Hospital Stay (HOSPITAL_COMMUNITY)
Admission: EM | Admit: 2015-11-15 | Discharge: 2015-11-19 | DRG: 682 | Disposition: A | Discharge status: Home Health | Payer: Medicare Other | Attending: Internal Medicine | Admitting: Internal Medicine

## 2015-11-15 ENCOUNTER — Encounter (HOSPITAL_COMMUNITY): Payer: Self-pay | Admitting: *Deleted

## 2015-11-15 ENCOUNTER — Emergency Department (HOSPITAL_COMMUNITY): Payer: Medicare Other

## 2015-11-15 DIAGNOSIS — C220 Liver cell carcinoma: Secondary | ICD-10-CM | POA: Diagnosis not present

## 2015-11-15 DIAGNOSIS — Z833 Family history of diabetes mellitus: Secondary | ICD-10-CM

## 2015-11-15 DIAGNOSIS — G9341 Metabolic encephalopathy: Secondary | ICD-10-CM | POA: Diagnosis present

## 2015-11-15 DIAGNOSIS — E860 Dehydration: Secondary | ICD-10-CM | POA: Diagnosis present

## 2015-11-15 DIAGNOSIS — B182 Chronic viral hepatitis C: Secondary | ICD-10-CM | POA: Diagnosis present

## 2015-11-15 DIAGNOSIS — D696 Thrombocytopenia, unspecified: Secondary | ICD-10-CM | POA: Diagnosis present

## 2015-11-15 DIAGNOSIS — E43 Unspecified severe protein-calorie malnutrition: Secondary | ICD-10-CM | POA: Diagnosis present

## 2015-11-15 DIAGNOSIS — Z8042 Family history of malignant neoplasm of prostate: Secondary | ICD-10-CM | POA: Diagnosis not present

## 2015-11-15 DIAGNOSIS — G9340 Encephalopathy, unspecified: Secondary | ICD-10-CM

## 2015-11-15 DIAGNOSIS — Z681 Body mass index (BMI) 19 or less, adult: Secondary | ICD-10-CM | POA: Diagnosis not present

## 2015-11-15 DIAGNOSIS — K7030 Alcoholic cirrhosis of liver without ascites: Secondary | ICD-10-CM | POA: Diagnosis present

## 2015-11-15 DIAGNOSIS — E871 Hypo-osmolality and hyponatremia: Secondary | ICD-10-CM | POA: Diagnosis present

## 2015-11-15 DIAGNOSIS — K7469 Other cirrhosis of liver: Secondary | ICD-10-CM | POA: Diagnosis not present

## 2015-11-15 DIAGNOSIS — R9431 Abnormal electrocardiogram [ECG] [EKG]: Secondary | ICD-10-CM | POA: Diagnosis present

## 2015-11-15 DIAGNOSIS — N179 Acute kidney failure, unspecified: Secondary | ICD-10-CM | POA: Diagnosis present

## 2015-11-15 DIAGNOSIS — Z8505 Personal history of malignant neoplasm of liver: Secondary | ICD-10-CM

## 2015-11-15 DIAGNOSIS — Z794 Long term (current) use of insulin: Secondary | ICD-10-CM

## 2015-11-15 DIAGNOSIS — N400 Enlarged prostate without lower urinary tract symptoms: Secondary | ICD-10-CM | POA: Diagnosis present

## 2015-11-15 DIAGNOSIS — K7290 Hepatic failure, unspecified without coma: Secondary | ICD-10-CM | POA: Diagnosis present

## 2015-11-15 DIAGNOSIS — E46 Unspecified protein-calorie malnutrition: Secondary | ICD-10-CM

## 2015-11-15 DIAGNOSIS — I9589 Other hypotension: Secondary | ICD-10-CM | POA: Diagnosis present

## 2015-11-15 DIAGNOSIS — Z8249 Family history of ischemic heart disease and other diseases of the circulatory system: Secondary | ICD-10-CM

## 2015-11-15 DIAGNOSIS — E118 Type 2 diabetes mellitus with unspecified complications: Secondary | ICD-10-CM | POA: Diagnosis present

## 2015-11-15 DIAGNOSIS — E119 Type 2 diabetes mellitus without complications: Secondary | ICD-10-CM | POA: Diagnosis present

## 2015-11-15 DIAGNOSIS — I10 Essential (primary) hypertension: Secondary | ICD-10-CM | POA: Diagnosis present

## 2015-11-15 DIAGNOSIS — Z800 Family history of malignant neoplasm of digestive organs: Secondary | ICD-10-CM | POA: Diagnosis not present

## 2015-11-15 DIAGNOSIS — E875 Hyperkalemia: Secondary | ICD-10-CM | POA: Diagnosis present

## 2015-11-15 DIAGNOSIS — B1920 Unspecified viral hepatitis C without hepatic coma: Secondary | ICD-10-CM | POA: Diagnosis present

## 2015-11-15 DIAGNOSIS — I341 Nonrheumatic mitral (valve) prolapse: Secondary | ICD-10-CM | POA: Diagnosis present

## 2015-11-15 DIAGNOSIS — A419 Sepsis, unspecified organism: Secondary | ICD-10-CM | POA: Diagnosis not present

## 2015-11-15 DIAGNOSIS — Z79899 Other long term (current) drug therapy: Secondary | ICD-10-CM

## 2015-11-15 DIAGNOSIS — Z87891 Personal history of nicotine dependence: Secondary | ICD-10-CM

## 2015-11-15 DIAGNOSIS — I4581 Long QT syndrome: Secondary | ICD-10-CM | POA: Diagnosis present

## 2015-11-15 DIAGNOSIS — I959 Hypotension, unspecified: Secondary | ICD-10-CM | POA: Diagnosis present

## 2015-11-15 DIAGNOSIS — E872 Acidosis: Secondary | ICD-10-CM | POA: Diagnosis present

## 2015-11-15 DIAGNOSIS — K7460 Unspecified cirrhosis of liver: Secondary | ICD-10-CM

## 2015-11-15 HISTORY — DX: Acute kidney failure, unspecified: N17.9

## 2015-11-15 LAB — COMPREHENSIVE METABOLIC PANEL
ALK PHOS: 81 U/L (ref 38–126)
ALT: 35 U/L (ref 17–63)
AST: 54 U/L — AB (ref 15–41)
Albumin: 2.7 g/dL — ABNORMAL LOW (ref 3.5–5.0)
Anion gap: 13 (ref 5–15)
BUN: 24 mg/dL — AB (ref 6–20)
CALCIUM: 9.7 mg/dL (ref 8.9–10.3)
CO2: 23 mmol/L (ref 22–32)
CREATININE: 2.24 mg/dL — AB (ref 0.61–1.24)
Chloride: 93 mmol/L — ABNORMAL LOW (ref 101–111)
GFR, EST AFRICAN AMERICAN: 33 mL/min — AB (ref >60)
GFR, EST NON AFRICAN AMERICAN: 29 mL/min — AB (ref >60)
Glucose, Bld: 280 mg/dL — ABNORMAL HIGH (ref 65–99)
Potassium: 5.2 mmol/L — ABNORMAL HIGH (ref 3.5–5.1)
SODIUM: 129 mmol/L — AB (ref 135–145)
Total Bilirubin: 4.2 mg/dL — ABNORMAL HIGH (ref 0.3–1.2)
Total Protein: 6.4 g/dL — ABNORMAL LOW (ref 6.5–8.1)

## 2015-11-15 LAB — CBC WITH DIFFERENTIAL/PLATELET
BASOS ABS: 0 10*3/uL (ref 0.0–0.1)
BASOS PCT: 0 %
Eosinophils Absolute: 0 10*3/uL (ref 0.0–0.7)
Eosinophils Relative: 0 %
HEMATOCRIT: 48.4 % (ref 39.0–52.0)
HEMOGLOBIN: 16.7 g/dL (ref 13.0–17.0)
LYMPHS PCT: 19 %
Lymphs Abs: 1 10*3/uL (ref 0.7–4.0)
MCH: 36.4 pg — ABNORMAL HIGH (ref 26.0–34.0)
MCHC: 34.5 g/dL (ref 30.0–36.0)
MCV: 105.4 fL — AB (ref 78.0–100.0)
Monocytes Absolute: 0.4 10*3/uL (ref 0.1–1.0)
Monocytes Relative: 8 %
NEUTROS ABS: 3.8 10*3/uL (ref 1.7–7.7)
NEUTROS PCT: 73 %
Platelets: 86 10*3/uL — ABNORMAL LOW (ref 150–400)
RBC: 4.59 MIL/uL (ref 4.22–5.81)
RDW: 14.1 % (ref 11.5–15.5)
WBC: 5.3 10*3/uL (ref 4.0–10.5)

## 2015-11-15 LAB — I-STAT CG4 LACTIC ACID, ED
LACTIC ACID, VENOUS: 5.73 mmol/L — AB (ref 0.5–1.9)
Lactic Acid, Venous: 5.9 mmol/L (ref 0.5–1.9)
Lactic Acid, Venous: 4.71 mmol/L (ref 0.5–1.9)

## 2015-11-15 LAB — AMMONIA: Ammonia: 36 umol/L — ABNORMAL HIGH (ref 9–35)

## 2015-11-15 LAB — CORTISOL: CORTISOL PLASMA: 12.5 ug/dL

## 2015-11-15 LAB — PROTIME-INR
INR: 1.45
PROTHROMBIN TIME: 17.8 s — AB (ref 11.4–15.2)

## 2015-11-15 MED ORDER — ALBUMIN HUMAN 5 % IV SOLN
12.5000 g | Freq: Once | INTRAVENOUS | Status: AC
  Administered 2015-11-15: 12.5 g via INTRAVENOUS
  Filled 2015-11-15: qty 250

## 2015-11-15 MED ORDER — SODIUM CHLORIDE 0.9 % IV BOLUS (SEPSIS)
1000.0000 mL | Freq: Once | INTRAVENOUS | Status: AC
  Administered 2015-11-15: 1000 mL via INTRAVENOUS

## 2015-11-15 MED ORDER — VANCOMYCIN HCL IN DEXTROSE 1-5 GM/200ML-% IV SOLN
1000.0000 mg | Freq: Once | INTRAVENOUS | Status: AC
  Administered 2015-11-15: 1000 mg via INTRAVENOUS
  Filled 2015-11-15: qty 200

## 2015-11-15 MED ORDER — PIPERACILLIN-TAZOBACTAM 3.375 G IVPB
3.3750 g | Freq: Once | INTRAVENOUS | Status: AC
  Administered 2015-11-15: 3.375 g via INTRAVENOUS
  Filled 2015-11-15: qty 50

## 2015-11-15 MED ORDER — SODIUM CHLORIDE 0.9 % IV BOLUS (SEPSIS)
2000.0000 mL | Freq: Once | INTRAVENOUS | Status: AC
  Administered 2015-11-15: 2000 mL via INTRAVENOUS

## 2015-11-15 NOTE — ED Provider Notes (Signed)
[facility] DEPT Provider Note   CSN: [id number] Arrival date & time: 11/15/15  1611     History   Chief Complaint Chief Complaint  Patient presents with  . Hypotension  . Other    abnormal labs    HPI Ethan Wade is a 65 y.o. male.  Patient complaining of weakness nausea and abdominal cramping. Patient has a history of cirrhosis liver cancer hepatitis C and diabetes.   The history is provided by the patient. No language interpreter was used.  Weakness  This is a recurrent problem. The current episode started 12 to 24 hours ago. The problem occurs constantly. The problem has not changed since onset.Pertinent negatives include no chest pain, no abdominal pain, no headaches and no shortness of breath. Nothing aggravates the symptoms. Nothing relieves the symptoms. He has tried nothing for the symptoms. The treatment provided no relief.    Past Medical History:  Diagnosis Date  . Arthritis    "joint stiffness in the knees" (11/10/2013)  . Ascites   . Esophageal bleed, non-variceal 2006  . Heart murmur   . Hepatic encephalopathy ([place]) 05/2015  . Hepatitis C 1980's  . HTN (hypertension)   . Liver cancer ([place])   . Macrocytosis   . Mitral valve prolapse    OCCAS FLUTTER FEELING  . Prostate enlargement    PT TOLD "NORMAL FOR AGE" - TAKES FINASTERIDE AND FLOMAX  . Psoriasis   . Rheumatic fever 1950's  . Thrombocytopenia ([place])   . Type II diabetes mellitus ([place])   . Umbilical hernia    OCCAS DISCOMFORT  . Vitamin D deficiency     Patient Active Problem List   Diagnosis Date Noted  . Protein-calorie malnutrition, severe 10/09/2015  . Encephalopathy, hepatic ([place]) 10/09/2015  . Encounter for palliative care   . Goals of care, counseling/discussion   . Hypotension 10/07/2015  . Hyperkalemia 09/27/2015  . Acute hepatic encephalopathy ([place]) 09/21/2015  . Altered mental status   . Mild dehydration   . Hepatocellular carcinoma ([place])   . Controlled type 2 DM with  peripheral circulatory disorder ([place])   . Pressure ulcer 02/24/2015  . Chronic liver disease and cirrhosis ([place])   . Diabetes type 2, controlled ([place])   . Increased ammonia level   . Hepatic encephalopathy ([place]) 04/01/2014  . Liver cancer ([place]) 04/01/2014  . Elevated troponin 04/01/2014  . Malnutrition of moderate degree ([place]) 11/13/2013  . PVT (portal vein thrombosis) 11/10/2013  . Thrombocytopenia ([place])   . Chronic hepatitis C with cirrhosis ([place])   . Esophageal bleed, non-variceal   . HTN (hypertension)   . Psoriasis   . Mitral valve prolapse   . Arthritis   . Prostate enlargement     Past Surgical History:  Procedure Laterality Date  . HERNIA REPAIR    . INSERTION OF MESH  03/09/2012   Procedure: INSERTION OF MESH;  Surgeon: Edward Jolly, MD;  Location: [facility];  Service: General;  Laterality: N/A;  . TONSILECTOMY, ADENOIDECTOMY, BILATERAL MYRINGOTOMY AND TUBES  child  . TONSILLECTOMY    . UMBILICAL HERNIA REPAIR  03/09/2012   Procedure: HERNIA REPAIR UMBILICAL ADULT;  Surgeon: Edward Jolly, MD;  Location: [facility];  Service: General;  Laterality: N/A;  Repair Umbilical Hernia with Mesh       Home Medications    Prior to Admission medications   Medication Sig Start Date End Date Taking? Authorizing Provider  Carboxymethylcellul-Glycerin (CLEAR EYES FOR DRY EYES) 1-0.25 % SOLN Apply 1-2 drops to  eye daily as needed (for dry eyes). Reported on 05/20/2015   Yes Historical Provider, MD  feeding supplement, ENSURE ENLIVE, (ENSURE ENLIVE) LIQD Take 237 mLs by mouth 2 (two) times daily between meals. 09/30/15  Yes Jani Gravel, MD  furosemide (LASIX) 40 MG tablet Take 1 tablet (40 mg total) by mouth daily. 10/09/15  Yes Debbe Odea, MD  insulin glargine (LANTUS) 100 UNIT/ML injection Inject 0.08 mLs (8 Units total) into the skin daily. Patient taking differently: Inject 14 Units into the skin daily.  04/04/14  Yes Shanker Kristeen Mans, MD  lactulose (CHRONULAC) 10 GM/15ML  solution Take 45 mLs (30 g total) by mouth 3 (three) times daily. 10/09/15  Yes Debbe Odea, MD  oxyCODONE (OXY IR/ROXICODONE) 5 MG immediate release tablet Take 1 tablet (5 mg total) by mouth every 12 (twelve) hours as needed for severe pain or breakthrough pain (only for severe or breakthrough pain). 11/13/13  Yes Ripudeep Krystal Eaton, MD  SORAfenib (NEXAVAR) 200 MG tablet Take 400 mg by mouth every 12 (twelve) hours. Take on an empty stomach 1 hour before or 2 hours after meals.   Yes Historical Provider, MD  spironolactone (ALDACTONE) 100 MG tablet Take 100 mg by mouth daily.   Yes Historical Provider, MD  Vitamin D, Ergocalciferol, (DRISDOL) 50000 units CAPS capsule Take 50,000 Units by mouth every 7 (seven) days.   Yes Historical Provider, MD  XIFAXAN 550 MG TABS tablet Take 550 mg by mouth 2 (two) times daily. 05/17/15  Yes Historical Provider, MD  zolpidem (AMBIEN) 5 MG tablet Take 5 mg by mouth at bedtime.   Yes Historical Provider, MD  dicyclomine (BENTYL) 10 MG capsule Take 1 capsule (10 mg total) by mouth 2 (two) times daily. 11/13/13   Ripudeep Krystal Eaton, MD  nadolol (CORGARD) 20 MG tablet Take 20 mg by mouth daily.    Historical Provider, MD    Family History Family History  Problem Relation Age of Onset  . COPD Mother   . Heart disease Father   . Diabetes Father   . Alcohol abuse Father   . Prostate cancer Brother   . Liver cancer Brother   . Colon cancer Brother   . COPD Maternal Grandfather     Social History Social History  Substance Use Topics  . Smoking status: Former Smoker    Years: 20.00    Types: Cigars    Start date: 12/22/2005  . Smokeless tobacco: Former Systems developer    Types: Snuff     Comment: "quit smoking ~ 2009; quit snuff in ~ 2013"  . Alcohol use Yes     Comment: "quit in late April 2015"     Allergies   Review of patient's allergies indicates no known allergies.   Review of Systems Review of Systems  Constitutional: Negative for appetite change and fatigue.    HENT: Negative for congestion, ear discharge and sinus pressure.   Eyes: Negative for discharge.  Respiratory: Negative for cough and shortness of breath.   Cardiovascular: Negative for chest pain.  Gastrointestinal: Negative for abdominal pain and diarrhea.       Patient complains of abdominal cramping and nausea  Genitourinary: Negative for frequency and hematuria.  Musculoskeletal: Negative for back pain.  Skin: Negative for rash.  Neurological: Positive for weakness. Negative for seizures and headaches.  Psychiatric/Behavioral: Negative for hallucinations.     Physical Exam Updated Vital Signs BP 91/73   Pulse 63   Temp 97.5 F (36.4 C) (Oral)   Resp  20   Ht 5\' 9"  (1.753 m)   Wt 126 lb (57.2 kg)   SpO2 100%   BMI 18.61 kg/m   Physical Exam  Constitutional: He is oriented to person, place, and time. He appears well-developed.  HENT:  Head: Normocephalic.  Dry mucous membranes  Eyes: Conjunctivae and EOM are normal. No scleral icterus.  Neck: Neck supple. No thyromegaly present.  Cardiovascular: Normal rate and regular rhythm.  Exam reveals no gallop and no friction rub.   No murmur heard. Pulmonary/Chest: No stridor. He has no wheezes. He has no rales. He exhibits no tenderness.  Abdominal: He exhibits no distension. There is no tenderness. There is no rebound.  Genitourinary:  Genitourinary Comments: Large hernia in scrotum  Musculoskeletal: Normal range of motion. He exhibits no edema.  Lymphadenopathy:    He has no cervical adenopathy.  Neurological: He is oriented to person, place, and time. He exhibits normal muscle tone. Coordination normal.  Skin: No rash noted. No erythema.  Psychiatric: He has a normal mood and affect. His behavior is normal.     ED Treatments / Results  Labs (all labs ordered are listed, but only abnormal results are displayed) Labs Reviewed  COMPREHENSIVE METABOLIC PANEL - Abnormal; Notable for the following:       Result Value    Sodium 129 (*)    Potassium 5.2 (*)    Chloride 93 (*)    Glucose, Bld 280 (*)    BUN 24 (*)    Creatinine, Ser 2.24 (*)    Total Protein 6.4 (*)    Albumin 2.7 (*)    AST 54 (*)    Total Bilirubin 4.2 (*)    GFR calc non Af Amer 29 (*)    GFR calc Af Amer 33 (*)    All other components within normal limits  CBC WITH DIFFERENTIAL/PLATELET - Abnormal; Notable for the following:    MCV 105.4 (*)    MCH 36.4 (*)    Platelets 86 (*)    All other components within normal limits  AMMONIA - Abnormal; Notable for the following:    Ammonia 36 (*)    All other components within normal limits  PROTIME-INR - Abnormal; Notable for the following:    Prothrombin Time 17.8 (*)    All other components within normal limits  I-STAT CG4 LACTIC ACID, ED - Abnormal; Notable for the following:    Lactic Acid, Venous 5.90 (*)    All other components within normal limits  I-STAT CG4 LACTIC ACID, ED - Abnormal; Notable for the following:    Lactic Acid, Venous 5.73 (*)    All other components within normal limits  I-STAT CG4 LACTIC ACID, ED - Abnormal; Notable for the following:    Lactic Acid, Venous 4.71 (*)    All other components within normal limits  URINE CULTURE  CULTURE, BLOOD (ROUTINE X 2)  CULTURE, BLOOD (ROUTINE X 2)  URINALYSIS, ROUTINE W REFLEX MICROSCOPIC (NOT AT [facility])  I-STAT CG4 LACTIC ACID, ED    EKG  EKG Interpretation None       Radiology Dg Chest Port 1 View  Result Date: 11/15/2015 CLINICAL DATA:  Hypotension, history of liver cancer EXAM: PORTABLE CHEST 1 VIEW COMPARISON:  10/07/2015 FINDINGS: The heart size and mediastinal contours are within normal limits. Both lungs are clear. The visualized skeletal structures are unremarkable. IMPRESSION: No active disease. Electronically Signed   By: Lahoma Crocker M.D.   On: 11/15/2015 18:47    Procedures  Procedures (including critical care time)  Medications Ordered in ED Medications  sodium chloride 0.9 % bolus 2,000 mL (0  mLs Intravenous Stopped 11/15/15 1855)  vancomycin (VANCOCIN) IVPB 1000 mg/200 mL premix (0 mg Intravenous Stopped 11/15/15 1925)  piperacillin-tazobactam (ZOSYN) IVPB 3.375 g (0 g Intravenous Stopped 11/15/15 1855)  sodium chloride 0.9 % bolus 1,000 mL (1,000 mLs Intravenous New Bag/Given 11/15/15 1858)     Initial Impression / Assessment and Plan / ED Course  I have reviewed the triage vital signs and the nursing notes.  Pertinent labs & imaging results that were available during my care of the patient were reviewed by me and considered in my medical decision making (see chart for details).  Clinical Course    CRITICAL CARE Performed by: Ryelle Ruvalcaba L Total critical care time: 50 minutes Critical care time was exclusive of separately billable procedures and treating other patients. Critical care was necessary to treat or prevent imminent or life-threatening deterioration. Critical care was time spent personally by me on the following activities: development of treatment plan with patient and/or surrogate as well as nursing, discussions with consultants, evaluation of patient's response to treatment, examination of patient, obtaining history from patient or surrogate, ordering and performing treatments and interventions, ordering and review of laboratory studies, ordering and review of radiographic studies, pulse oximetry and re-evaluation of patient's condition. Patient with hypotension and elevated lactic acid sepsis protocol was done patient was given antibiotics and fluids critical care was called and have agreed to come admit the patient  Final Clinical Impressions(s) / ED Diagnoses   Final diagnoses:  Hypotension, unspecified    New Prescriptions New Prescriptions   No medications on file     Milton Ferguson, MD 11/15/15 2019

## 2015-11-15 NOTE — H&P (Signed)
History and Physical  Patient Name: Ethan Wade     [id number]    DOB: 06-Nov-1950    DOA: 11/15/2015 PCP: Gerrit Heck, MD   Patient coming from: Home  Chief Complaint: Weakness, hypotension  HPI: Ethan Wade is a 65 y.o. male with a past medical history significant for hepatitis C and alcoholic cirrhosis, HCC on sorafenib followed at [facility], frequent HE and IDDM who presents with hypotension.  The patient was in his usual state of health until maybe that last day or so when he has started to feel more weak and tired than usual.  He has had decreased appetite and decreased UOP this week, emesis x1 today.  No fever, chills, cough, sputum, dyspnea.  No dysuria, hematuria, urinary symptoms.  No change in his chronic intermittent abdominal cramping, no ascites or swelling.  No new skin sores.  No indwelling hardware.  He has frequent HE but is on a stable regimen of lactulose and rifaximin and his mentation has been about baseline.  He is on sorafenib for [facility] with Dr. Leamon Arnt at [facility].  Today, he went to see his PCP who found him to have SBP 70 mmHg and sent him to the ER.  ED course: -Afebrile, heart rate 80s, respirations 20s, BP 80/66, and no hypoxia, mentating normally -Na 129 (baseline 135), K 5.2, Cr 2.24 (baseline 0.9-1.2), WBC 5.3K, Hgb 16 -Lactic acid 5.9 --> 5.7 --> 4.7 with 30 cc/kg bolus -vancomycin and Zosyn were started empirically and the patient was evaluated by CCM who felt patient stable for SDU and TRH were asked to evaluaet for admission     ROS: Review of Systems  Constitutional: Positive for malaise/fatigue and weight loss. Negative for chills and fever.  Respiratory: Negative for cough and sputum production.   Gastrointestinal: Positive for abdominal pain (But chronic). Negative for blood in stool, diarrhea, melena, nausea and vomiting.       No ascites  Genitourinary: Negative for dysuria, frequency, hematuria and urgency.       Decreased urine output    Neurological: Positive for weakness.       No confusion  All other systems reviewed and are negative.        Past Medical History:  Diagnosis Date  . Arthritis    "joint stiffness in the knees" (11/10/2013)  . Ascites   . Esophageal bleed, non-variceal 2006  . Heart murmur   . Hepatic encephalopathy ([place]) 05/2015  . Hepatitis C 1980's  . HTN (hypertension)   . Liver cancer ([place])   . Macrocytosis   . Mitral valve prolapse    OCCAS FLUTTER FEELING  . Prostate enlargement    PT TOLD "NORMAL FOR AGE" - TAKES FINASTERIDE AND FLOMAX  . Psoriasis   . Rheumatic fever 1950's  . Thrombocytopenia ([place])   . Type II diabetes mellitus ([place])   . Umbilical hernia    OCCAS DISCOMFORT  . Vitamin D deficiency     Past Surgical History:  Procedure Laterality Date  . HERNIA REPAIR    . INSERTION OF MESH  03/09/2012   Procedure: INSERTION OF MESH;  Surgeon: Edward Jolly, MD;  Location: [facility];  Service: General;  Laterality: N/A;  . TONSILECTOMY, ADENOIDECTOMY, BILATERAL MYRINGOTOMY AND TUBES  child  . TONSILLECTOMY    . UMBILICAL HERNIA REPAIR  03/09/2012   Procedure: HERNIA REPAIR UMBILICAL ADULT;  Surgeon: Edward Jolly, MD;  Location: [facility];  Service: General;  Laterality: N/A;  Repair Umbilical Hernia with Mesh  Social History: Patient lives alone.  The patient walks with a cane or walker.  Former smoker.  Used to work in Engineer, agricultural.  From [place] originally.    No Known Allergies  Family history: family history includes Alcohol abuse in his father; COPD in his maternal grandfather and mother; Colon cancer in his brother; Diabetes in his father; Heart disease in his father; Liver cancer in his brother; Prostate cancer in his brother.  Prior to Admission medications   Medication Sig Start Date End Date Taking? Authorizing Provider  Carboxymethylcellul-Glycerin (CLEAR EYES FOR DRY EYES) 1-0.25 % SOLN Apply 1-2 drops to eye daily as needed (for dry  eyes). Reported on 05/20/2015   Yes Historical Provider, MD  feeding supplement, ENSURE ENLIVE, (ENSURE ENLIVE) LIQD Take 237 mLs by mouth 2 (two) times daily between meals. 09/30/15  Yes Jani Gravel, MD  furosemide (LASIX) 40 MG tablet Take 1 tablet (40 mg total) by mouth daily. 10/09/15  Yes Debbe Odea, MD  insulin glargine (LANTUS) 100 UNIT/ML injection Inject 0.08 mLs (8 Units total) into the skin daily. Patient taking differently: Inject 14 Units into the skin daily.  04/04/14  Yes Shanker Kristeen Mans, MD  lactulose (CHRONULAC) 10 GM/15ML solution Take 45 mLs (30 g total) by mouth 3 (three) times daily. 10/09/15  Yes Debbe Odea, MD  oxyCODONE (OXY IR/ROXICODONE) 5 MG immediate release tablet Take 1 tablet (5 mg total) by mouth every 12 (twelve) hours as needed for severe pain or breakthrough pain (only for severe or breakthrough pain). 11/13/13  Yes Ripudeep Krystal Eaton, MD  SORAfenib (NEXAVAR) 200 MG tablet Take 400 mg by mouth every 12 (twelve) hours. Take on an empty stomach 1 hour before or 2 hours after meals.   Yes Historical Provider, MD  spironolactone (ALDACTONE) 100 MG tablet Take 100 mg by mouth daily.   Yes Historical Provider, MD  Vitamin D, Ergocalciferol, (DRISDOL) 50000 units CAPS capsule Take 50,000 Units by mouth every 7 (seven) days.   Yes Historical Provider, MD  XIFAXAN 550 MG TABS tablet Take 550 mg by mouth 2 (two) times daily. 05/17/15  Yes Historical Provider, MD  zolpidem (AMBIEN) 5 MG tablet Take 5 mg by mouth at bedtime.   Yes Historical Provider, MD  dicyclomine (BENTYL) 10 MG capsule Take 1 capsule (10 mg total) by mouth 2 (two) times daily. 11/13/13   Ripudeep Krystal Eaton, MD  nadolol (CORGARD) 20 MG tablet Take 20 mg by mouth daily.    Historical Provider, MD       Physical Exam: BP 96/67   Pulse 67   Temp 97.5 F (36.4 C) (Oral)   Resp 18   Ht 5\' 9"  (1.753 m)   Wt 57.2 kg (126 lb)   SpO2 100%   BMI 18.61 kg/m  General appearance: Cachectic older male, alert and in no  acute distress, conversational, oriented.   Eyes: Slightly icteric, conjunctiva pink, lids and lashes normal.     ENT: No nasal deformity, discharge, or epistaxis.  OP moist without lesions.   Lymph: No cervical or supraclavicular lymphadenopathy. Skin: Warm and dry.  Scattered ecchymosis.  No jaundice.  Eczema on anterior ankles. Cardiac: RRR, nl S1-S2, no murmurs appreciated.  Capillary refill is brisk.  JVP normal.  No LE edema.  Radial and DP pulses 2+ and symmetric. Respiratory: Normal respiratory rate and rhythm.  CTAB without rales or wheezes. GI: Abdomen soft without rigidity.  No TTP or gaurding. No ascites, distension, hepatosplenomegaly  that I appreciate.   MSK: No deformities or effusions.  No clubbing/cyanosis. Neuro: Sensorium intact and responding to questions, attention normal.  Speech is fluent.  Moves all extremities equally and with normal coordination, perhaps slightly generally weak. Psych: Affect normal.  Judgment and insight appear normal.       Labs on Admission:  I have personally reviewed following labs and imaging studies: CBC:  Recent Labs Lab 11/15/15 1647  WBC 5.3  NEUTROABS 3.8  HGB 16.7  HCT 48.4  MCV 105.4*  PLT 86*   Basic Metabolic Panel:  Recent Labs Lab 11/15/15 1647  NA 129*  K 5.2*  CL 93*  CO2 23  GLUCOSE 280*  BUN 24*  CREATININE 2.24*  CALCIUM 9.7   GFR: Estimated Creatinine Clearance: 26.2 mL/min (by C-G formula based on SCr of 2.24 mg/dL).  Liver Function Tests:  Recent Labs Lab 11/15/15 1647  AST 54*  ALT 35  ALKPHOS 81  BILITOT 4.2*  PROT 6.4*  ALBUMIN 2.7*   Recent Labs Lab 11/15/15 1644  AMMONIA 36*   Coagulation Profile:  Recent Labs Lab 11/15/15 1710  INR 1.45    Sepsis Labs: Lactic acid 5.9 --> 5.73 --> 4.7    Radiological Exams on Admission: Personally reviewed: Dg Chest Port 1 View  Result Date: 11/15/2015 CLINICAL DATA:  Hypotension, history of liver cancer EXAM: PORTABLE CHEST 1 VIEW  COMPARISON:  10/07/2015 FINDINGS: The heart size and mediastinal contours are within normal limits. Both lungs are clear. The visualized skeletal structures are unremarkable. IMPRESSION: No active disease. Electronically Signed   By: Lahoma Crocker M.D.   On: 11/15/2015 18:47    EKG: Independently reviewed. Rate 61, QTc 553, normal sinus, very low voltages, no ST changes.    Assessment/Plan 1. Hypotension, possible sepsis:  Source of infection is not clear.  The CXR is clear, the UA is unimpressive and he has no symptoms, and there is really no ascites at present.  Doesn't meet early warning signs criteria.  Lactate 5.9 mmol/L and trending, but clearance in liver failure likely poor.  Antibiotics delivered in the ED.  He could also be simply hypovolemic (his baseline BP is around high-90s and 100s) and he has lost a significant amount of weight he reports, and is not having good PO intake.  -Sepsis bundle utilized:  -Blood and urine cultures drawn  -30 ml/kg bolus given in ED, will repeat lactic acid  -Start targeted antibiotics with vancomycin and cefepime, based on unknown source of infection    -Repeat renal function and complete blood count in AM  -Code SEPSIS called to E-link   2. AKI:  -Hold furosemide and spironolactone -Check FeUrea -Renal US ordered  3. IDDM:  -Continue home glargine 8 units nightly -Continue home sliding scale with meals  4. Chronic cirrhosis with varices and HCC:  Followed by Dr. Rip [facility] GI and Dr. Sherrie Sport Onc.  Had frequent HE up until this past month, but seems to have gotten this under better control and his memory tonight seems relatively normal. -Continue sorafenib -Continue nadolol -Continue lactulose and rifaximin -Continue furo  5. Other medications:  -Ambien QHS PRN -Continue tamsulosin  6. Chronic thrombocytopenia: Stable -SCDs  7. Prolonged QTc:     DVT prophylaxis: SCDs  Code Status: FULL code  Family Communication: None  present  Disposition Plan: Anticipate IV fluids Consults called: CCM Admission status: INPATIENT, stepdown    Medical decision making: Patient seen at 9:41 PM on 11/15/2015.  The patient  was discussed with Dr. Roderic Palau. What exists of the patient's chart and outside records from [place] reviewed in depth.  Clinical condition: requiring close hemodynamic monitoring and frequent lab checks overnight.        Edwin Dada [facility] Pager [phone]

## 2015-11-15 NOTE — ED Notes (Signed)
Called radiology to confirm pt xray noted

## 2015-11-15 NOTE — ED Triage Notes (Signed)
Pt with hx of liver CA to ED sent by pcp for low sodium and hypotension.  Pt tx yesterday for elevated ammonia.

## 2015-11-15 NOTE — Consult Note (Addendum)
PULMONARY / CRITICAL CARE MEDICINE   Name: Ethan Wade MRN: [id number] DOB: October 07, 1950    ADMISSION DATE:  11/15/2015 CONSULTATION DATE:  11/15/2015  REFERRING MD:  Dr. Roderic Palau  CHIEF COMPLAINT:  Low blood pressure.  HISTORY OF PRESENT ILLNESS:   65 yo male with hx of hep C, cirrhosis and HCC brought to ER with altered mental status and low blood pressure.  He is followed at [facility] for Hep C and [place].  He is being set up for 2nd line tx for [facility] and has completed tx for Hep C.  He gets intermittent paracentesis.  He was noted to have elevated lactic acid.  Ammonia level was not significantly elevated.  His brother notes his mental status improved after taking lactulose.  He was given 2 liters IV fluids and antibiotics.  His blood pressure improved, and lactic acid was trending down.    He denies headache, sinus congestion, sore throat, chest pain, dyspnea.  He had dry heaves before coming to hospital.  He has discomfort in RUQ but reports this is chronic, and no change to usual pattern.  PAST MEDICAL HISTORY :  He  has a past medical history of Arthritis; Ascites; Esophageal bleed, non-variceal (2006); Heart murmur; Hepatic encephalopathy ([place]) (05/2015); Hepatitis C (1980's); HTN (hypertension); Liver cancer ([place]); Macrocytosis; Mitral valve prolapse; Prostate enlargement; Psoriasis; Rheumatic fever (1950's); Thrombocytopenia ([place]); Type II diabetes mellitus ([place]); Umbilical hernia; and Vitamin D deficiency.  PAST SURGICAL HISTORY: He  has a past surgical history that includes Tonsilectomy, adenoidectomy, bilateral myringotomy and tubes (child); Umbilical hernia repair (03/09/2012); Insertion of mesh (03/09/2012); Tonsillectomy; and Hernia repair.  No Known Allergies  No current facility-administered medications on file prior to encounter.    Current Outpatient Prescriptions on File Prior to Encounter  Medication Sig  . Carboxymethylcellul-Glycerin (CLEAR EYES FOR DRY EYES) 1-0.25 % SOLN  Apply 1-2 drops to eye daily as needed (for dry eyes). Reported on 05/20/2015  . feeding supplement, ENSURE ENLIVE, (ENSURE ENLIVE) LIQD Take 237 mLs by mouth 2 (two) times daily between meals.  . furosemide (LASIX) 40 MG tablet Take 1 tablet (40 mg total) by mouth daily.  . insulin glargine (LANTUS) 100 UNIT/ML injection Inject 0.08 mLs (8 Units total) into the skin daily. (Patient taking differently: Inject 14 Units into the skin daily. )  . lactulose (CHRONULAC) 10 GM/15ML solution Take 45 mLs (30 g total) by mouth 3 (three) times daily.  Marland Kitchen oxyCODONE (OXY IR/ROXICODONE) 5 MG immediate release tablet Take 1 tablet (5 mg total) by mouth every 12 (twelve) hours as needed for severe pain or breakthrough pain (only for severe or breakthrough pain).  . SORAfenib (NEXAVAR) 200 MG tablet Take 400 mg by mouth every 12 (twelve) hours. Take on an empty stomach 1 hour before or 2 hours after meals.  Marland Kitchen spironolactone (ALDACTONE) 100 MG tablet Take 100 mg by mouth daily.  Marland Kitchen XIFAXAN 550 MG TABS tablet Take 550 mg by mouth 2 (two) times daily.  Marland Kitchen zolpidem (AMBIEN) 5 MG tablet Take 5 mg by mouth at bedtime.  . dicyclomine (BENTYL) 10 MG capsule Take 1 capsule (10 mg total) by mouth 2 (two) times daily.  . nadolol (CORGARD) 20 MG tablet Take 20 mg by mouth daily.    FAMILY HISTORY:  His indicated that his mother is deceased. He indicated that his father is deceased. He indicated that the status of his maternal grandfather is unknown.    SOCIAL HISTORY: He  reports that he has quit smoking.  His smoking use included Cigars. He started smoking about 9 years ago. He quit after 20.00 years of use. He has quit using smokeless tobacco. His smokeless tobacco use included Snuff. He reports that he drinks alcohol. He reports that he uses drugs.  REVIEW OF SYSTEMS:   Negative except above.  SUBJECTIVE:  Wants more comfortable bed.  VITAL SIGNS: BP (!) 88/65   Pulse 67   Temp 97.5 F (36.4 C) (Oral)   Resp 18    Ht 5' 9"  (1.753 m)   Wt 126 lb (57.2 kg)   SpO2 100%   BMI 18.61 kg/m   INTAKE / OUTPUT: No intake/output data recorded.  PHYSICAL EXAMINATION: General:  Pleasant Neuro:  Alert and oriented x 3, normal strength, no asterixis, moves all extremities HEENT:  Pupils reactive, no LAN Cardiovascular:  Regular, no murmur Lungs:  No wheeze/rales Abdomen:  Soft, mild distention, mild tenderness RUQ, no guarding/rebound GU: large inguinal hernia Musculoskeletal:  No edema, decreased muscle bulk Skin:  No rashes  LABS:  BMET  Recent Labs Lab 11/15/15 1647  NA 129*  K 5.2*  CL 93*  CO2 23  BUN 24*  CREATININE 2.24*  GLUCOSE 280*    Electrolytes  Recent Labs Lab 11/15/15 1647  CALCIUM 9.7    CBC  Recent Labs Lab 11/15/15 1647  WBC 5.3  HGB 16.7  HCT 48.4  PLT 86*    Coag's  Recent Labs Lab 11/15/15 1710  INR 1.45    Sepsis Markers  Recent Labs Lab 11/15/15 1653 11/15/15 1807 11/15/15 1921  LATICACIDVEN 5.90* 5.73* 4.71*    ABG No results for input(s): PHART, PCO2ART, PO2ART in the last 168 hours.  Liver Enzymes  Recent Labs Lab 11/15/15 1647  AST 54*  ALT 35  ALKPHOS 81  BILITOT 4.2*  ALBUMIN 2.7*    Cardiac Enzymes No results for input(s): TROPONINI, PROBNP in the last 168 hours.  Glucose No results for input(s): GLUCAP in the last 168 hours.  Urinalysis    Component Value Date/Time   COLORURINE RED (A) 10/08/2015 0611   APPEARANCEUR CLOUDY (A) 10/08/2015 0611   LABSPEC 1.030 10/08/2015 0611   PHURINE 5.5 10/08/2015 0611   GLUCOSEU 100 (A) 10/08/2015 0611   HGBUR LARGE (A) 10/08/2015 0611   BILIRUBINUR MODERATE (A) 10/08/2015 0611   KETONESUR 15 (A) 10/08/2015 0611   PROTEINUR 100 (A) 10/08/2015 0611   UROBILINOGEN 2.0 (H) 08/17/2014 0421   NITRITE POSITIVE (A) 10/08/2015 0611   LEUKOCYTESUR SMALL (A) 10/08/2015 0611     Imaging Dg Chest Port 1 View  Result Date: 11/15/2015 CLINICAL DATA:  Hypotension, history of  liver cancer EXAM: PORTABLE CHEST 1 VIEW COMPARISON:  10/07/2015 FINDINGS: The heart size and mediastinal contours are within normal limits. Both lungs are clear. The visualized skeletal structures are unremarkable. IMPRESSION: No active disease. Electronically Signed   By: Lahoma Crocker M.D.   On: 11/15/2015 18:47     STUDIES:   CULTURES: Blood 8/18 >> Urine 8/18 >>   ANTIBIOTICS: Vancomycin 8/18 >> Zosyn 8/18 >>   SIGNIFICANT EVENTS:  LINES/TUBES:  DISCUSSION: 65 yo male with altered mental status, low blood pressure and lactic acidosis.  This is in setting of Hep C with cirrhosis, HCC being set up for 2nd line chemotherapy.  Mental status, blood pressure and lactic acid improving with IV fluids, and lactulose.  Concern for sepsis >> he has abnormal u/a.  No other clear source for infection.  ASSESSMENT / PLAN:  Sepsis with  possible UTI. Lactic acidosis. - continue Abx, IV fluids - f/u cx results - no indication for CVL placement, pressors, or ICU admission at this time - f/u lactic acid - could use albumin if blood pressure remains low - check cortisol  Hyponatremia. AKI >> baseline creatinine 1.22 from 10/09/15. - f/u BMET - monitor renal fx, urine outpt - check FeNa  Hx of Hep C with cirrhosis, HCC. - f/u with [place] - hold lasix, aldactone, nadolol will blood pressure is low  Protein calorie malnutrition. - f/u with nutrition  Acute metabolic encephalopathy. - continue lactulose, xifaxan  DM. - SSI  Goals of care. - he met with palliative care during last admission >> wants to continue aggressive care and remain full code  D/w Dr. Roderic Palau.  Okay for admission by [facility].  PCCM will be available if his clinical status deteriorates.  Updated pts brother at bedside.  Chesley Mires, MD [facility] Pulmonary/Critical Care 11/15/2015, 8:59 PM Pager:  [phone] After 3pm call: [phone]

## 2015-11-15 NOTE — ED Notes (Signed)
Admitting at bedside 

## 2015-11-15 NOTE — ED Notes (Signed)
EDP at bedside  

## 2015-11-15 NOTE — ED Notes (Signed)
Pt reminded of need for urine.  Is attempting.  Pt has large hernia and encouraging [place] not to cath him

## 2015-11-15 NOTE — ED Notes (Signed)
Pt took home cancer meds.  Will inventory and supply to pharmacy.

## 2015-11-15 NOTE — ED Notes (Addendum)
Lactic acid 5.9 called from mini lab, dr zammit aware

## 2015-11-15 NOTE — ED Notes (Signed)
Ammonia ordered per PA Shanon Brow.

## 2015-11-16 ENCOUNTER — Inpatient Hospital Stay (HOSPITAL_COMMUNITY): Payer: Medicare Other

## 2015-11-16 DIAGNOSIS — I959 Hypotension, unspecified: Secondary | ICD-10-CM

## 2015-11-16 DIAGNOSIS — R9431 Abnormal electrocardiogram [ECG] [EKG]: Secondary | ICD-10-CM | POA: Diagnosis present

## 2015-11-16 LAB — CBC
HCT: 38.7 % — ABNORMAL LOW (ref 39.0–52.0)
HEMOGLOBIN: 13.2 g/dL (ref 13.0–17.0)
MCH: 35.1 pg — AB (ref 26.0–34.0)
MCHC: 33.3 g/dL (ref 30.0–36.0)
MCV: 105.2 fL — AB (ref 78.0–100.0)
Platelets: 50 10*3/uL — ABNORMAL LOW (ref 150–400)
RBC: 3.68 MIL/uL — AB (ref 4.22–5.81)
RDW: 14.1 % (ref 11.5–15.5)
WBC: 5 10*3/uL (ref 4.0–10.5)

## 2015-11-16 LAB — GLUCOSE, CAPILLARY
GLUCOSE-CAPILLARY: 194 mg/dL — AB (ref 65–99)
GLUCOSE-CAPILLARY: 165 mg/dL — AB (ref 65–99)
Glucose-Capillary: 114 mg/dL — ABNORMAL HIGH (ref 65–99)

## 2015-11-16 LAB — URINE MICROSCOPIC-ADD ON: RBC / HPF: NONE SEEN RBC/hpf (ref 0–5)

## 2015-11-16 LAB — COMPREHENSIVE METABOLIC PANEL
ALK PHOS: 57 U/L (ref 38–126)
ALT: 28 U/L (ref 17–63)
AST: 43 U/L — AB (ref 15–41)
Albumin: 2.4 g/dL — ABNORMAL LOW (ref 3.5–5.0)
Anion gap: 10 (ref 5–15)
BUN: 25 mg/dL — AB (ref 6–20)
CALCIUM: 8.6 mg/dL — AB (ref 8.9–10.3)
CHLORIDE: 100 mmol/L — AB (ref 101–111)
CO2: 22 mmol/L (ref 22–32)
CREATININE: 1.89 mg/dL — AB (ref 0.61–1.24)
GFR, EST AFRICAN AMERICAN: 41 mL/min — AB (ref >60)
GFR, EST NON AFRICAN AMERICAN: 35 mL/min — AB (ref >60)
Glucose, Bld: 144 mg/dL — ABNORMAL HIGH (ref 65–99)
Potassium: 4.3 mmol/L (ref 3.5–5.1)
Sodium: 132 mmol/L — ABNORMAL LOW (ref 135–145)
Total Bilirubin: 2.9 mg/dL — ABNORMAL HIGH (ref 0.3–1.2)
Total Protein: 5.3 g/dL — ABNORMAL LOW (ref 6.5–8.1)

## 2015-11-16 LAB — URINALYSIS, ROUTINE W REFLEX MICROSCOPIC
BILIRUBIN URINE: NEGATIVE
GLUCOSE, UA: NEGATIVE mg/dL
HGB URINE DIPSTICK: NEGATIVE
KETONES UR: NEGATIVE mg/dL
Nitrite: NEGATIVE
PH: 5 (ref 5.0–8.0)
PROTEIN: NEGATIVE mg/dL
Specific Gravity, Urine: 1.02 (ref 1.005–1.030)

## 2015-11-16 LAB — OSMOLALITY: OSMOLALITY: 283 mosm/kg (ref 275–295)

## 2015-11-16 LAB — LACTIC ACID, PLASMA
LACTIC ACID, VENOUS: 3.8 mmol/L — AB (ref 0.5–1.9)
Lactic Acid, Venous: 3.2 mmol/L (ref 0.5–1.9)

## 2015-11-16 LAB — MRSA PCR SCREENING: MRSA by PCR: NEGATIVE

## 2015-11-16 LAB — SODIUM, URINE, RANDOM: SODIUM UR: 77 mmol/L

## 2015-11-16 LAB — CREATININE, URINE, RANDOM: CREATININE, URINE: 89.94 mg/dL

## 2015-11-16 MED ORDER — SORAFENIB TOSYLATE 200 MG PO TABS
400.0000 mg | ORAL_TABLET | Freq: Two times a day (BID) | ORAL | Status: DC
  Administered 2015-11-16 – 2015-11-19 (×7): 400 mg via ORAL
  Filled 2015-11-16 (×6): qty 2

## 2015-11-16 MED ORDER — ACETAMINOPHEN 650 MG RE SUPP: 650.0000 mg | Freq: Four times a day (QID) | RECTAL | Status: DC | PRN

## 2015-11-16 MED ORDER — INSULIN ASPART 100 UNIT/ML ~~LOC~~ SOLN
0.0000 [IU] | Freq: Every day | SUBCUTANEOUS | Status: DC
  Administered 2015-11-17: 2 [IU] via SUBCUTANEOUS

## 2015-11-16 MED ORDER — NADOLOL 20 MG PO TABS
20.0000 mg | ORAL_TABLET | Freq: Every day | ORAL | Status: DC
  Filled 2015-11-16: qty 1

## 2015-11-16 MED ORDER — INSULIN GLARGINE 100 UNIT/ML ~~LOC~~ SOLN
4.0000 [IU] | Freq: Every day | SUBCUTANEOUS | Status: DC
  Administered 2015-11-17: 4 [IU] via SUBCUTANEOUS
  Filled 2015-11-16: qty 0.04

## 2015-11-16 MED ORDER — ENSURE ENLIVE PO LIQD
237.0000 mL | Freq: Two times a day (BID) | ORAL | Status: DC
  Administered 2015-11-16 – 2015-11-17 (×4): 237 mL via ORAL

## 2015-11-16 MED ORDER — SODIUM CHLORIDE 0.9 % IV SOLN
500.0000 mg | INTRAVENOUS | Status: DC
  Filled 2015-11-16: qty 500

## 2015-11-16 MED ORDER — RIFAXIMIN 550 MG PO TABS
550.0000 mg | ORAL_TABLET | Freq: Two times a day (BID) | ORAL | Status: DC
  Administered 2015-11-16 – 2015-11-19 (×7): 550 mg via ORAL
  Filled 2015-11-16 (×8): qty 1

## 2015-11-16 MED ORDER — ACETAMINOPHEN 325 MG PO TABS: 650.0000 mg | ORAL_TABLET | Freq: Four times a day (QID) | ORAL | Status: DC | PRN

## 2015-11-16 MED ORDER — VANCOMYCIN HCL IN DEXTROSE 1-5 GM/200ML-% IV SOLN
1000.0000 mg | INTRAVENOUS | Status: DC
  Filled 2015-11-16: qty 200

## 2015-11-16 MED ORDER — DEXTROSE 5 % IV SOLN
2.0000 g | INTRAVENOUS | Status: DC
  Administered 2015-11-16: 2 g via INTRAVENOUS
  Filled 2015-11-16: qty 2

## 2015-11-16 MED ORDER — INSULIN ASPART 100 UNIT/ML ~~LOC~~ SOLN
0.0000 [IU] | Freq: Three times a day (TID) | SUBCUTANEOUS | Status: DC
Start: 2015-11-16 — End: 2015-11-19
  Administered 2015-11-16: 3 [IU] via SUBCUTANEOUS
  Administered 2015-11-16 – 2015-11-17 (×2): 2 [IU] via SUBCUTANEOUS
  Administered 2015-11-17: 5 [IU] via SUBCUTANEOUS
  Administered 2015-11-17: 7 [IU] via SUBCUTANEOUS
  Administered 2015-11-18: 2 [IU] via SUBCUTANEOUS
  Administered 2015-11-18: 3 [IU] via SUBCUTANEOUS
  Administered 2015-11-18 – 2015-11-19 (×2): 2 [IU] via SUBCUTANEOUS
  Administered 2015-11-19: 1 [IU] via SUBCUTANEOUS

## 2015-11-16 MED ORDER — ZOLPIDEM TARTRATE 5 MG PO TABS
5.0000 mg | ORAL_TABLET | Freq: Every day | ORAL | Status: DC
  Administered 2015-11-16 – 2015-11-18 (×3): 5 mg via ORAL
  Filled 2015-11-16 (×3): qty 1

## 2015-11-16 MED ORDER — SODIUM CHLORIDE 0.9 % IV BOLUS (SEPSIS)
500.0000 mL | Freq: Once | INTRAVENOUS | Status: AC
Start: 2015-11-16 — End: 2015-11-16
  Administered 2015-11-16: 500 mL via INTRAVENOUS

## 2015-11-16 MED ORDER — OXYCODONE HCL 5 MG PO TABS
5.0000 mg | ORAL_TABLET | Freq: Two times a day (BID) | ORAL | Status: DC | PRN
  Administered 2015-11-16: 5 mg via ORAL
  Filled 2015-11-16: qty 1

## 2015-11-16 MED ORDER — SODIUM CHLORIDE 0.9 % IV BOLUS (SEPSIS): 500.0000 mL | Freq: Once | INTRAVENOUS | Status: DC

## 2015-11-16 MED ORDER — NADOLOL 20 MG PO TABS
20.0000 mg | ORAL_TABLET | Freq: Every day | ORAL | Status: DC
  Administered 2015-11-18 – 2015-11-19 (×2): 20 mg via ORAL
  Filled 2015-11-16 (×4): qty 1

## 2015-11-16 MED ORDER — ACETAMINOPHEN 650 MG RE SUPP: 325.0000 mg | Freq: Four times a day (QID) | RECTAL | Status: DC | PRN

## 2015-11-16 MED ORDER — INSULIN GLARGINE 100 UNIT/ML ~~LOC~~ SOLN
8.0000 [IU] | Freq: Every day | SUBCUTANEOUS | Status: DC
  Filled 2015-11-16: qty 0.08

## 2015-11-16 MED ORDER — ACETAMINOPHEN 325 MG PO TABS: 325.0000 mg | ORAL_TABLET | Freq: Four times a day (QID) | ORAL | Status: DC | PRN

## 2015-11-16 MED ORDER — LACTULOSE 10 GM/15ML PO SOLN
30.0000 g | Freq: Three times a day (TID) | ORAL | Status: DC
  Administered 2015-11-16 – 2015-11-17 (×2): 30 g via ORAL
  Filled 2015-11-16 (×5): qty 45

## 2015-11-16 MED ORDER — SODIUM CHLORIDE 0.9 % IV SOLN
INTRAVENOUS | Status: DC
  Administered 2015-11-16: 12:00:00 via INTRAVENOUS

## 2015-11-16 NOTE — Progress Notes (Signed)
Pharmacy Antibiotic Note  Ethan Wade is a 65 y.o. male admitted on 11/15/2015 with weakness/hypotension.  Pharmacy has been consulted for Vancomycin/Cefepime dosing for sepsis. WBC WNL, LA 4.71 > 3.2, afeb. SCr trending down to 1.89, CrCl ~33 ml/min.   Plan: -Increase vancomycin to 1000 mg IV q24h -Continue cefepime 2g IV q24h -Monitor renal function, c/s, clinical status, abx LOT, VT at ss prn  Height: 5\' 11"  (180.3 cm) Weight: 136 lb 0.4 oz (61.7 kg) IBW/kg (Calculated) : 75.3  Temp (24hrs), Avg:97.6 F (36.4 C), Min:97.5 F (36.4 C), Max:97.7 F (36.5 C)   Recent Labs Lab 11/15/15 1647 11/15/15 1653 11/15/15 1807 11/15/15 1921 11/16/15 0238 11/16/15 0519  WBC 5.3  --   --   --   --  5.0  CREATININE 2.24*  --   --   --   --  1.89*  LATICACIDVEN  --  5.90* 5.73* 4.71* 3.8* 3.2*    Estimated Creatinine Clearance: 33.6 mL/min (by C-G formula based on SCr of 1.89 mg/dL).    8/18 zosyn x1 8/18 vanc >> 8/19 cefepime >>  8/18 BCx: 8/18 UCx: 8/19 MRSA PCR: neg   Gwenlyn Perking, PharmD PGY1 Pharmacy Resident Pager: [phone] 11/16/2015 11:09 AM

## 2015-11-16 NOTE — Progress Notes (Signed)
Pt transferred from ED with RN on monitor.   Pt SBP now 70-80s. Pt awake, alert and oriented.

## 2015-11-16 NOTE — Progress Notes (Signed)
Pharmacy Antibiotic Note  Ethan Wade is a 65 y.o. male admitted on 11/15/2015 with weakness/hypotension.  Pharmacy has been consulted for Vancomycin/Cefepime dosing for sepsis. WBC WNL. Noted bump in Scr. Pt has hx HCC on oral chemo.   Plan: -Vancomycin 500 mg IV q24h -Cefepime 2g IV q24h -Trend WBC, temp, renal function  -Drug levels as indicated   Height: 5\' 9"  (175.3 cm) Weight: 126 lb (57.2 kg) IBW/kg (Calculated) : 70.7  Temp (24hrs), Avg:97.5 F (36.4 C), Min:97.5 F (36.4 C), Max:97.5 F (36.4 C)   Recent Labs Lab 11/15/15 1647 11/15/15 1653 11/15/15 1807 11/15/15 1921  WBC 5.3  --   --   --   CREATININE 2.24*  --   --   --   LATICACIDVEN  --  5.90* 5.73* 4.71*    Estimated Creatinine Clearance: 26.2 mL/min (by C-G formula based on SCr of 2.24 mg/dL).     Narda Bonds 11/16/2015 2:22 AM

## 2015-11-16 NOTE — Progress Notes (Signed)
Called to bedside for low SBP 60s by nursing.  Patient is lying in bed, cuff on arm above heart, but mentating well, feeling no worse malaise, apprehension, states feels "better" and "thinking is clearer".  Heart rate unchanged.  Albumin infusing.    Will hold off on further fluids and watch for now.  When albumin completes, if MAP still low, will bolus again and discuss with CCM.  At any point if patient mentation or clinical status detiorates, will call CCM.

## 2015-11-16 NOTE — ED Notes (Signed)
Report attempted, RN to call back in 5 min

## 2015-11-16 NOTE — ED Notes (Signed)
Notified by lab, lactic acid 3.8

## 2015-11-16 NOTE — Progress Notes (Signed)
[name] TEAM 1 - Stepdown/ICU Ethan Wade  [id number] DOB: [birth year] DOA: 11/15/2015 PCP: Gerrit Heck, MD    Brief Narrative:  65 y.o. male with a history of hepatitis C and alcoholic cirrhosis, HCC on sorafenib followed at [facility], frequent hepatic encephalopathy, and DM who presented with hypotension.  The patient was in his usual state of health until 1 day prior to his admit when he started to feel more weak and tired than usual.  He had decreased appetite and decreased UOP, w/ emesis x1.  He went to see his PCP who found him to have SBP 70 mmHg and sent him to the ER.  In the ED he had a heart rate in the 80s, respirations 20s, BP 80/66, and no hypoxia.  He was mentating normally.  Na 129 (baseline 135), K 5.2, Cr 2.24 (baseline 0.9-1.2), WBC 5.3K, Hgb 16.  Lactic acid 5.9 --> 5.7 --> 4.7 with 30 cc/kg bolus.  Vanc and Zosyn were started empirically and the patient was evaluated by CCM who declared him stable for SDU.  Subjective: The patient is alert and conversant.  He admits that he has had very poor appetite for 2-3 months and has been consistently losing weight.  His brother at the bedside confirms this.  He denies current chest pain shortness of breath nausea vomiting fevers or chills.  He reports chronic crampy lower abdominal pain which has not changed.  Assessment & Plan:  Hypotension of unclear etiology CXR is clear - UA is unimpressive - no ascites at present - Lactate 5.9 mmol/L and trending, but clearance in liver failure likely poor - baseline BP is 90-100s and he has lost a significant amount of weight - w/ no objective evidence of infection, will stop abx and follow - appears this may all simply be due to [facility] related to lack of intake   AKI Renal function improving with volume resuscitation - baseline creatinine 0.9-1.2 - renal ultrasound without acute findings  Hyponatremia Due to liver disease - follow w/ hydration   Hyperkalemia Improving  with improvement in renal function - follow trend  DM  CBG currently well-controlled  Chronic cirrhosis with varices and HCC Followed by Dr. Gershon Cull w/ [facility] and Dr. 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 rate 16, height 5\' 11"  (1.803 m), weight 61.7 kg (136 lb 0.4 oz), SpO2 100 %.  Intake/Output Summary (Last 24 hours) at 11/16/15 1117 Last data filed at 11/16/15 0601  Gross per 24 hour  Intake               50 ml  Output                0 ml  Net               50 ml   Filed Weights   11/15/15 1629 11/16/15 0658  Weight: 57.2 kg (126 lb) 61.7 kg (136 lb 0.4 oz)    Examination: General: No acute respiratory distress - cachectic  Lungs: Clear to auscultation bilaterally without  wheezes or crackles Cardiovascular: Regular rate and rhythm without murmur gallop or rub normal S1 and S2 Abdomen: Nontender, nondistended, soft, bowel sounds positive, no rebound, no ascites, no appreciable mass Extremities: No significant cyanosis, clubbing, or edema bilateral lower extremities  CBC:  Recent Labs Lab 11/15/15 1647 11/16/15 0519  WBC 5.3 5.0  NEUTROABS 3.8  --   HGB 16.7 13.2  HCT 48.4 38.7*  MCV 105.4* 105.2*  PLT 86* 50*   Basic Metabolic Panel:  Recent Labs Lab 11/15/15 1647 11/16/15 0519  NA 129* 132*  K 5.2* 4.3  CL 93* 100*  CO2 23 22  GLUCOSE 280* 144*  BUN 24* 25*  CREATININE 2.24* 1.89*  CALCIUM 9.7 8.6*   GFR: Estimated Creatinine Clearance: 33.6 mL/min (by C-G formula based on SCr  of 1.89 mg/dL).  Liver Function Tests:  Recent Labs Lab 11/15/15 1647 11/16/15 0519  AST 54* 43*  ALT 35 28  ALKPHOS 81 57  BILITOT 4.2* 2.9*  PROT 6.4* 5.3*  ALBUMIN 2.7* 2.4*    Recent Labs Lab 11/15/15 1644  AMMONIA 36*    Coagulation Profile:  Recent Labs Lab 11/15/15 1710  INR 1.45    HbA1C: Hgb A1c MFr Bld  Date/Time Value Ref Range Status  09/21/2015 04:21 PM 5.5 4.8 - 5.6 % Final    Comment:    (NOTE)         Pre-diabetes: 5.7 - 6.4         Diabetes: >6.4         Glycemic control for adults with diabetes: <7.0   06/08/2015 01:53 PM 6.7 (H) 4.8 - 5.6 % Final    Comment:    (NOTE)         Pre-diabetes: 5.7 - 6.4         Diabetes: >6.4         Glycemic control for adults with diabetes: <7.0     CBG:  Recent Labs Lab 11/16/15 0846  GLUCAP 114*    Recent Results (from the past 240 hour(s))  MRSA PCR Screening     Status: None   Collection Time: 11/16/15  7:05 AM  Result Value Ref Range Status   MRSA by PCR NEGATIVE NEGATIVE Final    Comment:        The GeneXpert MRSA Assay (FDA approved for NASAL specimens only), is one component of a comprehensive MRSA colonization surveillance program. It is not intended to diagnose MRSA infection nor to guide or monitor treatment for MRSA infections.      Scheduled Meds: . ceFEPime (MAXIPIME) IV  2 g Intravenous Q24H  . feeding supplement (ENSURE ENLIVE)  237 mL Oral BID BM  . insulin aspart  0-5 Units Subcutaneous QHS  . insulin aspart  0-9 Units Subcutaneous TID WC  . insulin glargine  8 Units Subcutaneous Daily  . lactulose  30 g Oral TID  . nadolol  20 mg Oral Daily  . rifaximin  550 mg Oral BID  . SORAfenib  400 mg Oral Q12H  . vancomycin  1,000 mg Intravenous Q24H  . zolpidem  5 mg Oral QHS      LOS: 1 day   Cherene Altes, MD [facility] Office  [phone] Pager - Text Page per Shea Evans as per below:  On-Call/Text Page:      [email or web address]      password [email or web address]  If  7PM-7AM, please contact night-coverage [email or web address] Password [email or web address] 11/16/2015, 11:17 AM

## 2015-11-17 LAB — URINE CULTURE: Culture: NO GROWTH

## 2015-11-17 LAB — COMPREHENSIVE METABOLIC PANEL
ALBUMIN: 2.2 g/dL — AB (ref 3.5–5.0)
ALK PHOS: 63 U/L (ref 38–126)
ALT: 28 U/L (ref 17–63)
ANION GAP: 12 (ref 5–15)
AST: 43 U/L — ABNORMAL HIGH (ref 15–41)
BILIRUBIN TOTAL: 1.9 mg/dL — AB (ref 0.3–1.2)
BUN: 25 mg/dL — AB (ref 6–20)
CALCIUM: 8.2 mg/dL — AB (ref 8.9–10.3)
CO2: 19 mmol/L — AB (ref 22–32)
CREATININE: 1.45 mg/dL — AB (ref 0.61–1.24)
Chloride: 100 mmol/L — ABNORMAL LOW (ref 101–111)
GFR calc Af Amer: 56 mL/min — ABNORMAL LOW (ref >60)
GFR calc non Af Amer: 49 mL/min — ABNORMAL LOW (ref >60)
GLUCOSE: 220 mg/dL — AB (ref 65–99)
Potassium: 3.9 mmol/L (ref 3.5–5.1)
SODIUM: 131 mmol/L — AB (ref 135–145)
TOTAL PROTEIN: 5.2 g/dL — AB (ref 6.5–8.1)

## 2015-11-17 LAB — CBC
HCT: 40.4 % (ref 39.0–52.0)
HEMOGLOBIN: 13.7 g/dL (ref 13.0–17.0)
MCH: 36 pg — AB (ref 26.0–34.0)
MCHC: 33.9 g/dL (ref 30.0–36.0)
MCV: 106 fL — ABNORMAL HIGH (ref 78.0–100.0)
PLATELETS: 42 10*3/uL — AB (ref 150–400)
RBC: 3.81 MIL/uL — ABNORMAL LOW (ref 4.22–5.81)
RDW: 14.1 % (ref 11.5–15.5)
WBC: 4.8 10*3/uL (ref 4.0–10.5)

## 2015-11-17 LAB — GLUCOSE, CAPILLARY
GLUCOSE-CAPILLARY: 185 mg/dL — AB (ref 65–99)
GLUCOSE-CAPILLARY: 307 mg/dL — AB (ref 65–99)
GLUCOSE-CAPILLARY: 219 mg/dL — AB (ref 65–99)
Glucose-Capillary: 263 mg/dL — ABNORMAL HIGH (ref 65–99)

## 2015-11-17 MED ORDER — INSULIN GLARGINE 100 UNIT/ML ~~LOC~~ SOLN
8.0000 [IU] | Freq: Every day | SUBCUTANEOUS | Status: DC
  Administered 2015-11-18 – 2015-11-19 (×2): 8 [IU] via SUBCUTANEOUS
  Filled 2015-11-17 (×2): qty 0.08

## 2015-11-17 NOTE — Progress Notes (Signed)
Patient refusing lactulose states "doctor doesn't want him to take it anymore".

## 2015-11-17 NOTE — Progress Notes (Signed)
Ethan Wade - Stepdown/ICU Markavion Chrisler  [id number] DOB: 08-11-1950 DOA: 11/15/2015 PCP: Gerrit Heck, MD    Brief Narrative:  65 y.o. male with a history of hepatitis C and alcoholic cirrhosis, HCC on sorafenib followed at [facility], frequent hepatic encephalopathy, and DM who presented with hypotension.  The patient was in his usual state of health until Wade day prior to his admit when he started to feel more weak and tired than usual.  He had decreased appetite and decreased UOP, w/ emesis x1.  He went to see his PCP who found him to have SBP 70 mmHg and sent him to the ER.  In the ED he had a heart rate in the 80s, respirations 20s, BP 80/66, and no hypoxia.  He was mentating normally.  Na 129 (baseline 135), K 5.2, Cr 2.24 (baseline 0.9-Wade.2), WBC 5.3K, Hgb 16.  Lactic acid 5.9 --> 5.7 --> 4.7 with 30 cc/kg bolus.  Vanc and Zosyn were started empirically and the patient was evaluated by CCM who declared him stable for SDU.  Subjective: The patient is much more alert and conversant today.  He complains of diarrhea related to the lactulose.  He complains of feeling weak in general.  He denies chest pain shortness of breath fevers chills or abdominal pain.  He reports his appetite is improving and his intake is increasing.  Assessment & Plan:  Hypotension of unclear etiology CXR is clear - UA is unimpressive - no ascites at present - baseline BP is 90-100s and he has lost a significant amount of weight - w/ no objective evidence of infection stopped abx - appears this was simply due to [facility] related to lack of intake - BP now back to his reported baseline   AKI Renal function improving with volume resuscitation - baseline creatinine 0.9-Wade.2 - renal ultrasound without acute findings - crt approaching baseline   Recent Labs Lab 11/15/15 1647 11/16/15 0519 11/17/15 0458  CREATININE 2.24* Wade.89* Wade.45*    Hyponatremia Due to liver disease - stable    Hyperkalemia resolved with improvement in renal function   DM  CBG climbing - adjust tx and follow   Chronic cirrhosis with varices and HCC Followed by Dr. Gershon Cull w/ [facility] and Dr. 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 rate (!) 24, height 5\' 11"  (Wade.803 m), weight 61.7 kg (136 lb 0.4 oz), SpO2 100 %.  Intake/Output Summary (Last 24 hours) at 11/17/15 1328 Last data filed at 11/17/15 0836  Gross per 24 hour  Intake          1633.75 ml  Output              600 ml  Net          1033.75 ml   Filed Weights   11/15/15 1629 11/16/15  [id number]  Weight: 57.2 kg (126 lb) 61.7 kg (136 lb 0.4 oz)    Examination: General: No acute respiratory distress - cachectic - alert  Lungs: Clear to auscultation bilaterally - no wheezes or crackles Cardiovascular: Regular rate and rhythm without murmur gallop or rub normal S1 and S2 Abdomen: Nontender, nondistended, soft, bowel sounds positive, no rebound, no ascites, no appreciable mass Extremities: No significant cyanosis, clubbing, edema bilateral lower extremities  CBC:  Recent Labs Lab 11/15/15 1647 11/16/15 0519 11/17/15 0458  WBC 5.3 5.0 4.8  NEUTROABS 3.8  --   --   HGB 16.7 13.2 13.7  HCT 48.4 38.7* 40.4  MCV 105.4* 105.2* 106.0*  PLT 86* 50* 42*    Basic Metabolic Panel:  Recent Labs Lab 11/15/15 1647 11/16/15 0519 11/17/15 0458  NA 129* 132* 131*  K 5.2* 4.3 3.9  CL 93* 100* 100*  CO2 23 22 19*  GLUCOSE 280* 144* 220*  BUN 24* 25* 25*  CREATININE 2.24* Wade.89* Wade.45*  CALCIUM 9.7 8.6* 8.2*   GFR: Estimated Creatinine Clearance: 43.7 mL/min (by C-G formula based on SCr of Wade.45 mg/dL).  Liver Function Tests:  Recent Labs Lab 11/15/15 1647 11/16/15 0519 11/17/15 0458  AST 54* 43* 43*  ALT 35 28 28  ALKPHOS 81 57 63  BILITOT 4.2* 2.9* Wade.9*  PROT 6.4* 5.3* 5.2*  ALBUMIN 2.7* 2.4* 2.2*    Recent Labs Lab 11/15/15 1644  AMMONIA 36*    Coagulation Profile:  Recent Labs Lab 11/15/15 1710  INR Wade.45    HbA1C: Hgb A1c MFr Bld  Date/Time Value Ref Range Status  09/21/2015 04:21 PM 5.5 4.8 - 5.6 % Final    Comment:    (NOTE)         Pre-diabetes: 5.7 - 6.4         Diabetes: >6.4         Glycemic control for adults with diabetes: <7.0   06/08/2015 01:53 PM 6.7 (H) 4.8 - 5.6 % Final    Comment:    (NOTE)         Pre-diabetes: 5.7 - 6.4         Diabetes: >6.4         Glycemic control for adults with diabetes: <7.0     CBG:  Recent Labs Lab 11/16/15 0846 11/16/15 1154 11/16/15 2044 11/17/15 0757 11/17/15 1143  GLUCAP 114* 194* 165* 185* 263*    Recent Results (from the past 240 hour(s))  Blood Culture (routine x 2)     Status: None (Preliminary result)   Collection Time: 11/15/15  5:40 PM  Result Value Ref Range Status   Specimen Description BLOOD LEFT ANTECUBITAL  Final   Special Requests BOTTLES DRAWN AEROBIC AND ANAEROBIC 5CC  Final   Culture NO GROWTH < 24 HOURS  Final   Report Status PENDING  Incomplete  Blood Culture (routine x 2)     Status: None (Preliminary result)   Collection Time: 11/15/15  5:42 PM  Result Value Ref Range Status   Specimen Description BLOOD RIGHT FOREARM  Final   Special Requests BOTTLES DRAWN AEROBIC AND ANAEROBIC 5CC  Final   Culture NO GROWTH < 24 HOURS   Final   Report Status PENDING  Incomplete  Urine culture     Status: None   Collection Time: 11/15/15 11:18 PM  Result Value Ref Range Status   Specimen Description URINE, RANDOM  Final   Special Requests NONE  Final   Culture NO GROWTH  Final   Report Status 11/17/2015 FINAL  Final  MRSA PCR Screening     Status: None   Collection Time: 11/16/15  7:05 AM  Result Value Ref Range Status   MRSA by PCR NEGATIVE NEGATIVE Final    Comment:        The GeneXpert MRSA Assay (FDA approved for NASAL specimens only), is one component of a comprehensive MRSA colonization surveillance program. It is not intended to diagnose MRSA infection nor to guide or monitor treatment for MRSA infections.      Scheduled Meds: . feeding supplement (ENSURE ENLIVE)  237 mL Oral BID BM  . insulin aspart  0-5 Units Subcutaneous QHS  . insulin aspart  0-9 Units Subcutaneous TID WC  . insulin glargine  4 Units Subcutaneous Daily  . lactulose  30 g Oral TID  . nadolol  20 mg Oral Daily  . rifaximin  550 mg Oral BID  . SORAfenib  400 mg Oral Q12H  . zolpidem  5 mg Oral QHS      LOS: 2 days   Cherene Altes, MD [facility] Office  [phone] Pager - Text Page per Amion as per below:  On-Call/Text Page:      [email or web address]      password [email or web address]  If 7PM-7AM, please contact night-coverage [email or web address] Password [email or web address] 11/17/2015, Wade:28 PM

## 2015-11-17 NOTE — Evaluation (Signed)
Physical Therapy Evaluation Patient Details Name: Ethan Wade MRN: [id number] DOB: 1951/01/16 Today's Date: 11/17/2015   History of Present Illness  Pt adm with hypotension due to dehydration. Pt with alcoholic cirrhosis and hep C, DM, liver CA.  Clinical Impression  Pt admitted with above diagnosis and presents to PT with functional limitations due to deficits listed below (See PT problem list). Pt needs skilled PT to maximize independence and safety to allow discharge to home. Pt doing well with walker and reports has been successful at home with walker. From mobility standpoint should be able to return home.     Follow Up Recommendations Home health PT;Supervision - Intermittent    Equipment Recommendations  None recommended by PT    Recommendations for Other Services       Precautions / Restrictions Precautions Precautions: Fall Restrictions Weight Bearing Restrictions: No      Mobility  Bed Mobility Overal bed mobility: Modified Independent             General bed mobility comments: Needs incr time  Transfers Overall transfer level: Needs assistance Equipment used: Rolling walker (2 wheeled) Transfers: Sit to/from Stand Sit to Stand: Supervision         General transfer comment: for safety. Good placement of hands  Ambulation/Gait Ambulation/Gait assistance: Supervision Ambulation Distance (Feet): 200 Feet Assistive device: Rolling walker (2 wheeled) Gait Pattern/deviations: Step-through pattern;Decreased stride length Gait velocity: decr Gait velocity interpretation: Below normal speed for age/gender General Gait Details: supervision for safety. Good use of walker for stability  Stairs            Wheelchair Mobility    Modified Rankin (Stroke Patients Only)       Balance Overall balance assessment: Needs assistance Sitting-balance support: No upper extremity supported;Feet supported Sitting balance-Leahy Scale: Good     Standing  balance support: No upper extremity supported Standing balance-Leahy Scale: Fair                               Pertinent Vitals/Pain Pain Assessment: No/denies pain    Home Living Family/patient expects to be discharged to:: Private residence Living Arrangements: Alone Available Help at Discharge: Family;Available PRN/intermittently (brother checks on some) Type of Home: House Home Access: Stairs to enter Entrance Stairs-Rails: Psychiatric nurse of Steps: 2 Home Layout: One level Home Equipment: Cane - single point;Bedside commode;Walker - 4 wheels      Prior Function Level of Independence: Independent with assistive device(s)         Comments: Pt has been primarily using rollator since last admission last month. Pt denies falls since last admission.     Hand Dominance   Dominant Hand: Right    Extremity/Trunk Assessment   Upper Extremity Assessment: Defer to OT evaluation           Lower Extremity Assessment: Generalized weakness         Communication   Communication: No difficulties  Cognition Arousal/Alertness: Awake/alert Behavior During Therapy: WFL for tasks assessed/performed Overall Cognitive Status: Within Functional Limits for tasks assessed                      General Comments      Exercises        Assessment/Plan    PT Assessment Patient needs continued PT services  PT Diagnosis Generalized weakness;Difficulty walking   PT Problem List Decreased strength;Decreased activity tolerance;Decreased balance;Decreased mobility  PT Treatment Interventions  DME instruction;Gait training;Functional mobility training;Therapeutic exercise;Therapeutic activities;Balance training;Patient/family education   PT Goals (Current goals can be found in the Care Plan section) Acute Rehab PT Goals Patient Stated Goal: return home PT Goal Formulation: With patient Time For Goal Achievement: 11/24/15 Potential to Achieve  Goals: Good    Frequency Min 3X/week   Barriers to discharge Decreased caregiver support lives alone    Co-evaluation               End of Session Equipment Utilized During Treatment: Gait belt Activity Tolerance: Patient tolerated treatment well Patient left: in chair;with call bell/phone within reach;with nursing/sitter in room Nurse Communication: Mobility status         Time: 1540-1600 PT Time Calculation (min) (ACUTE ONLY): 20 min   Charges:   PT Evaluation $PT Eval Moderate Complexity: 1 Procedure     PT G Codes:        Ethan Wade Dec 05, 2015, 4:22 PM [facility] PT [phone]

## 2015-11-18 LAB — BASIC METABOLIC PANEL
Anion gap: 7 (ref 5–15)
BUN: 21 mg/dL — AB (ref 6–20)
CHLORIDE: 107 mmol/L (ref 101–111)
CO2: 19 mmol/L — ABNORMAL LOW (ref 22–32)
CREATININE: 0.95 mg/dL (ref 0.61–1.24)
Calcium: 7.9 mg/dL — ABNORMAL LOW (ref 8.9–10.3)
Glucose, Bld: 141 mg/dL — ABNORMAL HIGH (ref 65–99)
POTASSIUM: 4.1 mmol/L (ref 3.5–5.1)
SODIUM: 133 mmol/L — AB (ref 135–145)

## 2015-11-18 LAB — GLUCOSE, CAPILLARY
GLUCOSE-CAPILLARY: 225 mg/dL — AB (ref 65–99)
GLUCOSE-CAPILLARY: 179 mg/dL — AB (ref 65–99)
Glucose-Capillary: 159 mg/dL — ABNORMAL HIGH (ref 65–99)
Glucose-Capillary: 189 mg/dL — ABNORMAL HIGH (ref 65–99)
Glucose-Capillary: 248 mg/dL — ABNORMAL HIGH (ref 65–99)

## 2015-11-18 LAB — CBC
HCT: 40.2 % (ref 39.0–52.0)
Hemoglobin: 13.2 g/dL (ref 13.0–17.0)
MCH: 34.9 pg — ABNORMAL HIGH (ref 26.0–34.0)
MCHC: 32.8 g/dL (ref 30.0–36.0)
MCV: 106.3 fL — AB (ref 78.0–100.0)
PLATELETS: 36 10*3/uL — AB (ref 150–400)
RBC: 3.78 MIL/uL — AB (ref 4.22–5.81)
RDW: 13.8 % (ref 11.5–15.5)
WBC: 4.1 10*3/uL (ref 4.0–10.5)

## 2015-11-18 MED ORDER — GLUCERNA SHAKE PO LIQD
237.0000 mL | Freq: Three times a day (TID) | ORAL | Status: DC
  Administered 2015-11-18 – 2015-11-19 (×2): 237 mL via ORAL

## 2015-11-18 MED ORDER — GLUCERNA SHAKE PO LIQD: 237.0000 mL | Freq: Two times a day (BID) | ORAL | Status: DC

## 2015-11-18 NOTE — Care Management Note (Signed)
Case Management Note  Patient Details  Name: Ethan Wade MRN: [id number] Date of Birth: Jul 11, 1950  Subjective/Objective:    pateint lives alone, he has rolling walker and a rollator, has [employer] , which he has no problems getting medications, he will have transportation at [employer] by his brother Juanda Crumble.  He has a pcp Leighton Ruff at [facility].  He is active with [facility] for HHRN,PT and aide and would like to cont with these services.  Will need a resume order.  Soc will begin 24-48 hrs post dc.                Action/Plan:   Expected Discharge Date:                  Expected Discharge Plan:  [place]  In-House Referral:     Discharge planning Services  CM Consult  Post Acute Care Choice:  Resumption of Svcs/PTA Provider Choice offered to:  Patient  DME Arranged:    DME Agency:     HH Arranged:  RN, PT, Nurse's Aide [place] Agency:  [place]  Status of Service:  Completed, signed off  If discussed at [place] of Stay Meetings, dates discussed:    Additional Comments:  Zenon Mayo, RN 11/18/2015, 10:48 AM

## 2015-11-18 NOTE — Progress Notes (Signed)
Initial Nutrition Assessment  DOCUMENTATION CODES:   Severe malnutrition in context of chronic illness  INTERVENTION:    Glucerna Shake PO TID, each supplement provides 220 kcal and 10 grams of protein, chocolate flavor if available.  If patient does not drink the Glucerna Shakes, recommend change to Ensure Enlive po BID, each supplement provides 350 kcal and 20 grams of protein.  NUTRITION DIAGNOSIS:   Malnutrition related to chronic illness as evidenced by severe depletion of muscle mass, severe depletion of body fat.  GOAL:   Patient will meet greater than or equal to 90% of their needs  MONITOR:   PO intake, Supplement acceptance, Labs, Skin, I & O's  REASON FOR ASSESSMENT:   Malnutrition Screening Tool    ASSESSMENT:   65 y.o. 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 Labs reviewed; sodium is low. CBG's: 307-219 (8/20), 159-189 (8/21) Medications reviewed and include Novolog and Lantus Patient reports poor appetite and poor oral intake over the past few months. He eats poorly at home because he lives by himself. He has been eating well since admission to the hospital, consuming 100% of meals. He likes chocolate flavored supplements, willing to try Glucerna in chocolate flavor (if available in hospital) instead of Ensure since his glucoses have been elevated. If chocolate Glucerna is unavailable, recommend change back to Ensure to make sure patient is receiving adequate protein and calories to promote repletion of lean body mass. Glucerna provides 220 kcal and 10 grams of protein per serving. Ensure Enlive provides 350 kcal and 20 grams of protein per serving.  Nutrition-Focused physical exam completed. Findings are severe fat depletion, severe muscle depletion, and no edema.  Patient with some weight loss. Weight appears to fluctuate, likely  related to fluid status with hx of liver disease.  Patient with severe PCM.  Diet Order:  Diet heart healthy/carb modified Room service appropriate? Yes; Fluid consistency: Thin  Skin:  Reviewed, no issues  Last BM:  8/21  Height:   Ht Readings from Last 1 Encounters:  11/16/15 5\' 11"  (1.803 m)    Weight:   Wt Readings from Last 1 Encounters:  11/16/15 136 lb 0.4 oz (61.7 kg)    Ideal Body Weight:  78.2 kg  BMI:  Body mass index is 18.97 kg/m.  Estimated Nutritional Needs:   Kcal:  [id number]  Protein:  90-100 gm  Fluid:  1.8-2 L  EDUCATION NEEDS:   Education needs addressed  Molli Barrows, [place], [place], [place] Pager [phone] After Hours Pager [phone]

## 2015-11-18 NOTE — Care Management Note (Addendum)
Case Management Note  Patient Details  Name: Ethan Wade MRN: [id number] Date of Birth: 09-Jan-1951  Subjective/Objective:                 Per Butch Penny with [facility], patient is not active. Patient was receiving services, and [facility] has recommended that patient be followed by home hospice. CM met with patient to explain home hospice services vs home health services. CM left patient paperwork explaining hospice care as well. Patient considering options at this time.    Addendum: Patient evaluated by [facility], patient is not appropriate at this point for home hospice care per admission RN.  Patient would like to follow up with [facility] after DC. Referral made, orders requested for [facility].     Action/Plan:   Expected Discharge Date:                  Expected Discharge Plan:     In-House Referral:     Discharge planning Services  CM Consult  Post Acute Care Choice:    Choice offered to:  Patient  DME Arranged:    DME Agency:     HH Arranged:    [place] Agency:     Status of Service:     If discussed at [employer] of Stay Meetings, dates discussed:    Additional Comments:  Ethan Collet, RN 11/18/2015, 3:53 PM

## 2015-11-18 NOTE — Progress Notes (Signed)
Attempted report to 5W. 

## 2015-11-18 NOTE — Progress Notes (Signed)
[facility]  Patient Status: Active (receiving services up to time of hospitalization)  [facility] is providing the following services: RN, PT and HHA  If patient discharges after hours, please call [phone].   Ethan Wade 11/18/2015, 12:05 PM

## 2015-11-18 NOTE — Evaluation (Signed)
Occupational Therapy Evaluation Patient Details Name: Ethan Wade MRN: [id number] DOB: 11-Nov-1950 Today's Date: 11/18/2015    History of Present Illness Pt adm with hypotension due to dehydration. Pt with alcoholic cirrhosis and hep C, DM, liver CA.   Clinical Impression   PT admitted with dehydration. Pt currently with functional limitiations due to the deficits listed below (see OT problem list). PTA was livnig at home alone mod I and sponge bath for bathing.  Pt will benefit from skilled OT to increase their independence and safety with adls and balance to allow discharge [place].     Follow Up Recommendations  Home health OT    Equipment Recommendations  None recommended by OT    Recommendations for Other Services       Precautions / Restrictions Precautions Precautions: Fall Restrictions Weight Bearing Restrictions: No      Mobility Bed Mobility               General bed mobility comments: IN CHAIR ON ARRIVAL  Transfers Overall transfer level: Needs assistance Equipment used: Rolling walker (2 wheeled) Transfers: Sit to/from Stand Sit to Stand: Supervision              Balance Overall balance assessment: Needs assistance Sitting-balance support: No upper extremity supported;Feet supported Sitting balance-Leahy Scale: Good     Standing balance support: Single extremity supported;During functional activity Standing balance-Leahy Scale: Fair                              ADL Overall ADL's : Needs assistance/impaired Eating/Feeding: Independent   Grooming: Wash/dry hands;Wash/dry face;Oral care;Supervision/safety;Sitting   Upper Body Bathing: Lexicographer: Supervision/safety           Functional mobility during ADLs: Supervision/safety General ADL Comments: pt static standing at sink for oral care adn reports extreme fatigue after task     Vision Vision Assessment?: No apparent  visual deficits   Perception     Praxis      Pertinent Vitals/Pain Pain Assessment: No/denies pain     Hand Dominance Left   Extremity/Trunk Assessment Upper Extremity Assessment Upper Extremity Assessment: Generalized weakness   Lower Extremity Assessment Lower Extremity Assessment: Defer to PT evaluation   Cervical / Trunk Assessment Cervical / Trunk Assessment: Kyphotic   Communication Communication Communication: No difficulties   Cognition Arousal/Alertness: Awake/alert Behavior During Therapy: WFL for tasks assessed/performed Overall Cognitive Status: Impaired/Different from baseline Area of Impairment: Memory     Memory: Decreased short-term memory         General Comments: Pt requesting oral care and lotion and then asking therapist what was next. Pt with no recall to his request   General Comments       Exercises       Shoulder Instructions      Home Living Family/patient expects to be discharged to:: Private residence Living Arrangements: Alone Available Help at Discharge: Family;Available PRN/intermittently Type of Home: House Home Access: Stairs to enter [employer] of Steps: 2 Entrance Stairs-Rails: Right;Left Home Layout: One level     Bathroom Shower/Tub: Teacher, early years/pre: Standard     Home Equipment: Cane - single point;Bedside commode;Walker - 4 wheels;Shower seat;Hand held shower head   Additional Comments: sponge bath mainly due to strength or energy . Had tub seat for couple weeks      Prior  Functioning/Environment Level of Independence: Independent with assistive device(s)        Comments: Pt has been primarily using rollator since last admission last month. Pt denies falls since last admission.    OT Diagnosis: Generalized weakness;Acute pain;Cognitive deficits   OT Problem List: Decreased strength;Decreased activity tolerance;Impaired balance (sitting and/or standing);Decreased  cognition;Decreased safety awareness;Decreased knowledge of use of DME or AE;Decreased knowledge of precautions;Cardiopulmonary status limiting activity;Pain   OT Treatment/Interventions: Self-care/ADL training;Therapeutic exercise    OT Goals(Current goals can be found in the care plan section) Acute Rehab OT Goals Patient Stated Goal: return home OT Goal Formulation: With patient Time For Goal Achievement: 12/02/15 Potential to Achieve Goals: Good  OT Frequency: Min 2X/week   Barriers to D/C:            Co-evaluation              End of Session Equipment Utilized During Treatment: Rolling walker Nurse Communication: Mobility status;Precautions  Activity Tolerance: Patient tolerated treatment well Patient left: in chair;with call bell/phone within reach;with family/visitor present   Time: [id number] OT Time Calculation (min): 28 min Charges:  OT General Charges $OT Visit: 1 Procedure OT Evaluation $OT Eval Moderate Complexity: 1 Procedure OT Treatments $Self Care/Home Management : 8-22 mins G-Codes:    Peri Maris 11-20-2015, 2:20 PM   Jeri Modena   OTR/L Pager: [phone] Office: [phone] .

## 2015-11-18 NOTE — Progress Notes (Signed)
[facility] TEAM 1 - Stepdown/ICU Ethan Wade  [id number] DOB: December 29, 1950 DOA: 11/15/2015 PCP: Gerrit Heck, MD    Brief Narrative:  65 y.o. male with a history of hepatitis C and alcoholic cirrhosis, HCC on sorafenib followed at [facility], frequent hepatic encephalopathy, and DM who presented with hypotension.  The patient was in his usual state of health until 1 day prior to his admit when he started to feel more weak and tired than usual.  He had decreased appetite and decreased UOP, w/ emesis x1.  He went to see his PCP who found him to have SBP 70 mmHg and sent him to the ER.  In the ED he had a heart rate in the 80s, respirations 20s, BP 80/66, and no hypoxia.  He was mentating normally.  Na 129 (baseline 135), K 5.2, Cr 2.24 (baseline 0.9-1.2), WBC 5.3K, Hgb 16.  Lactic acid 5.9 --> 5.7 --> 4.7 with 30 cc/kg bolus.  Vanc and Zosyn were started empirically and the patient was evaluated by CCM who declared him stable for SDU.  Subjective: The patient is sitting up in a bedside chair.  He has worked with physical and occupational therapy today.  He denies chest pain shortness breath fevers chills nausea or vomiting.  He feels that his appetite is improving.  Assessment & Plan:  Hypotension of unclear etiology CXR was clear - UA was unimpressive - no ascites - baseline BP 90-100s and he had lost a significant amount of weight - w/ no objective evidence of infection stopped abx - appears this was simply due to [name] related to lack of intake - BP back to his reported baseline   AKI - resolved Renal function has recovered with volume resuscitation - baseline creatinine 0.9-1.2 - renal ultrasound without acute findings  Recent Labs Lab 11/15/15 1647 11/16/15 0519 11/17/15 0458 11/18/15 0225  CREATININE 2.24* 1.89* 1.45* 0.95    Hyponatremia Due to liver disease - stable   Hyperkalemia resolved with improvement in renal function   DM  CBG improved  Chronic  cirrhosis with varices and HCC Followed by Dr. Gershon Cull w/ [facility] and Dr. 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 - follow  Recent Labs Lab 11/15/15 1647 11/16/15 0519 11/17/15 0458 11/18/15 0225  PLT 86* 50* 42* 36*     Prolonged QTc Maximize electrolytes - follow on tele   Severe malnutrition in context of chronic illness  DVT prophylaxis: SCDs Code Status: FULL CODE Family Communication: Spoke with brother at bedside Disposition Plan: PT/OT report pt stable for home w/ HH - if mental status remains intact we'll plan to discharge home 8/22 with home health  Consultants:  PCCM  Procedures: none  Antimicrobials:  Vancomycin 8/18  Zosyn 8/18 Cefepime 8/19   Objective: Blood pressure 97/60, pulse 60, temperature 98.1 F (36.7 C), temperature source Oral, resp. rate 19, height 5\' 11"  (1.803 m), weight 61.7 kg (136 lb 0.4 oz), SpO2 100 %.  Intake/Output Summary (Last 24 hours) at 11/18/15 1627 Last data filed at 11/18/15 1300  Gross per 24 hour  Intake              780 ml  Output                0 ml  Net  780 ml   Filed Weights   11/15/15 1629 11/16/15 0658  Weight: 57.2 kg (126 lb) 61.7 kg (136 lb 0.4 oz)    Examination: General: No acute respiratory distress - Alert and oriented Lungs: Clear to auscultation bilaterally - no wheezes or crackles Cardiovascular: Regular rate and rhythm without murmur gallop or rub Abdomen: Nontender, nondistended, soft, bowel sounds positive, no rebound, no ascites Extremities: No significant cyanosis, clubbing, or edema bilateral lower extremities  CBC:  Recent Labs Lab 11/15/15 1647 11/16/15 0519 11/17/15 0458 11/18/15 0225  WBC 5.3 5.0 4.8 4.1  NEUTROABS 3.8  --   --   --   HGB 16.7 13.2 13.7 13.2  HCT 48.4 38.7* 40.4 40.2  MCV 105.4*  105.2* 106.0* 106.3*  PLT 86* 50* 42* 36*   Basic Metabolic Panel:  Recent Labs Lab 11/15/15 1647 11/16/15 0519 11/17/15 0458 11/18/15 0225  NA 129* 132* 131* 133*  K 5.2* 4.3 3.9 4.1  CL 93* 100* 100* 107  CO2 23 22 19* 19*  GLUCOSE 280* 144* 220* 141*  BUN 24* 25* 25* 21*  CREATININE 2.24* 1.89* 1.45* 0.95  CALCIUM 9.7 8.6* 8.2* 7.9*   GFR: Estimated Creatinine Clearance: 66.8 mL/min (by C-G formula based on SCr of 0.95 mg/dL).  Liver Function Tests:  Recent Labs Lab 11/15/15 1647 11/16/15 0519 11/17/15 0458  AST 54* 43* 43*  ALT 35 28 28  ALKPHOS 81 57 63  BILITOT 4.2* 2.9* 1.9*  PROT 6.4* 5.3* 5.2*  ALBUMIN 2.7* 2.4* 2.2*    Recent Labs Lab 11/15/15 1644  AMMONIA 36*    Coagulation Profile:  Recent Labs Lab 11/15/15 1710  INR 1.45    HbA1C: Hgb A1c MFr Bld  Date/Time Value Ref Range Status  09/21/2015 04:21 PM 5.5 4.8 - 5.6 % Final    Comment:    (NOTE)         Pre-diabetes: 5.7 - 6.4         Diabetes: >6.4         Glycemic control for adults with diabetes: <7.0   06/08/2015 01:53 PM 6.7 (H) 4.8 - 5.6 % Final    Comment:    (NOTE)         Pre-diabetes: 5.7 - 6.4         Diabetes: >6.4         Glycemic control for adults with diabetes: <7.0     CBG:  Recent Labs Lab 11/17/15 1143 11/17/15 1705 11/17/15 2147 11/18/15 0838 11/18/15 1241  GLUCAP 263* 307* 219* 159* 189*    Recent Results (from the past 240 hour(s))  Blood Culture (routine x 2)     Status: None (Preliminary result)   Collection Time: 11/15/15  5:40 PM  Result Value Ref Range Status   Specimen Description BLOOD LEFT ANTECUBITAL  Final   Special Requests BOTTLES DRAWN AEROBIC AND ANAEROBIC 5CC  Final   Culture NO GROWTH 3 DAYS  Final   Report Status PENDING  Incomplete  Blood Culture (routine x 2)     Status: None (Preliminary result)   Collection Time: 11/15/15  5:42 PM  Result Value Ref Range Status   Specimen Description BLOOD RIGHT FOREARM  Final    Special Requests BOTTLES DRAWN AEROBIC AND ANAEROBIC 5CC  Final   Culture NO GROWTH 3 DAYS  Final   Report Status PENDING  Incomplete  Urine culture     Status: None   Collection Time: 11/15/15 11:18 PM  Result Value Ref Range  Status   Specimen Description URINE, RANDOM  Final   Special Requests NONE  Final   Culture NO GROWTH  Final   Report Status 11/17/2015 FINAL  Final  MRSA PCR Screening     Status: None   Collection Time: 11/16/15  7:05 AM  Result Value Ref Range Status   MRSA by PCR NEGATIVE NEGATIVE Final    Comment:        The GeneXpert MRSA Assay (FDA approved for NASAL specimens only), is one component of a comprehensive MRSA colonization surveillance program. It is not intended to diagnose MRSA infection nor to guide or monitor treatment for MRSA infections.      Scheduled Meds: . feeding supplement (GLUCERNA SHAKE)  237 mL Oral TID BM  . insulin aspart  0-5 Units Subcutaneous QHS  . insulin aspart  0-9 Units Subcutaneous TID WC  . insulin glargine  8 Units Subcutaneous Daily  . nadolol  20 mg Oral Daily  . rifaximin  550 mg Oral BID  . SORAfenib  400 mg Oral Q12H  . zolpidem  5 mg Oral QHS      LOS: 3 days   Cherene Altes, MD [facility] Office  [phone] Pager - Text Page per Amion as per below:  On-Call/Text Page:      [email or web address]      password [email or web address]  If 7PM-7AM, please contact night-coverage [email or web address] Password [email or web address] 11/18/2015, 4:27 PM

## 2015-11-19 DIAGNOSIS — D696 Thrombocytopenia, unspecified: Secondary | ICD-10-CM

## 2015-11-19 DIAGNOSIS — E43 Unspecified severe protein-calorie malnutrition: Secondary | ICD-10-CM

## 2015-11-19 DIAGNOSIS — K7460 Unspecified cirrhosis of liver: Secondary | ICD-10-CM

## 2015-11-19 DIAGNOSIS — E118 Type 2 diabetes mellitus with unspecified complications: Secondary | ICD-10-CM

## 2015-11-19 DIAGNOSIS — I4581 Long QT syndrome: Secondary | ICD-10-CM

## 2015-11-19 DIAGNOSIS — N179 Acute kidney failure, unspecified: Secondary | ICD-10-CM | POA: Diagnosis present

## 2015-11-19 DIAGNOSIS — I950 Idiopathic hypotension: Secondary | ICD-10-CM

## 2015-11-19 LAB — COMPREHENSIVE METABOLIC PANEL
ALT: 25 U/L (ref 17–63)
ANION GAP: 4 — AB (ref 5–15)
AST: 41 U/L (ref 15–41)
Albumin: 2.2 g/dL — ABNORMAL LOW (ref 3.5–5.0)
Alkaline Phosphatase: 57 U/L (ref 38–126)
BILIRUBIN TOTAL: 1.8 mg/dL — AB (ref 0.3–1.2)
BUN: 17 mg/dL (ref 6–20)
CHLORIDE: 105 mmol/L (ref 101–111)
CO2: 22 mmol/L (ref 22–32)
Calcium: 7.9 mg/dL — ABNORMAL LOW (ref 8.9–10.3)
Creatinine, Ser: 0.87 mg/dL (ref 0.61–1.24)
Glucose, Bld: 171 mg/dL — ABNORMAL HIGH (ref 65–99)
POTASSIUM: 4.5 mmol/L (ref 3.5–5.1)
Sodium: 131 mmol/L — ABNORMAL LOW (ref 135–145)
TOTAL PROTEIN: 5 g/dL — AB (ref 6.5–8.1)

## 2015-11-19 LAB — VITAMIN B12: VITAMIN B 12: 2960 pg/mL — AB (ref 180–914)

## 2015-11-19 LAB — FOLATE: FOLATE: 19.5 ng/mL (ref >5.9)

## 2015-11-19 LAB — GLUCOSE, CAPILLARY
GLUCOSE-CAPILLARY: 149 mg/dL — AB (ref 65–99)
Glucose-Capillary: 197 mg/dL — ABNORMAL HIGH (ref 65–99)

## 2015-11-19 MED ORDER — NADOLOL 20 MG PO TABS: 20.0000 mg | ORAL_TABLET | Freq: Every day | ORAL | 0 refills | Status: AC

## 2015-11-19 NOTE — Progress Notes (Signed)
Nsg Discharge Note  Admit Date:  11/15/2015 Discharge date: 11/19/2015   [name] to be D/C'd Home with home health per MD order.  AVS completed.  Copy for chart, and copy for patient signed, and dated. Patient/caregiver able to verbalize understanding.  Discharge Medication:   Medication List    STOP taking these medications   dicyclomine 10 MG capsule Commonly known as:  BENTYL   furosemide 40 MG tablet Commonly known as:  LASIX   lactulose 10 GM/15ML solution Commonly known as:  CHRONULAC   spironolactone 100 MG tablet Commonly known as:  ALDACTONE     TAKE these medications   CLEAR EYES FOR DRY EYES 1-0.25 % Soln Generic drug:  Carboxymethylcellul-Glycerin Apply 1-2 drops to eye daily as needed (for dry eyes). Reported on 05/20/2015   feeding supplement (ENSURE ENLIVE) Liqd Take 237 mLs by mouth 2 (two) times daily between meals.   insulin glargine 100 UNIT/ML injection Commonly known as:  LANTUS Inject 0.08 mLs (8 Units total) into the skin daily. What changed:  how much to take   nadolol 20 MG tablet Commonly known as:  CORGARD Take 1 tablet (20 mg total) by mouth daily.   oxyCODONE 5 MG immediate release tablet Commonly known as:  Oxy IR/ROXICODONE Take 1 tablet (5 mg total) by mouth every 12 (twelve) hours as needed for severe pain or breakthrough pain (only for severe or breakthrough pain).   SORAfenib 200 MG tablet Commonly known as:  NEXAVAR Take 400 mg by mouth every 12 (twelve) hours. Take on an empty stomach 1 hour before or 2 hours after meals.   Vitamin D (Ergocalciferol) 50000 units Caps capsule Commonly known as:  DRISDOL Take 50,000 Units by mouth every 7 (seven) days.   XIFAXAN 550 MG Tabs tablet Generic drug:  rifaximin Take 550 mg by mouth 2 (two) times daily.   zolpidem 5 MG tablet Commonly known as:  AMBIEN Take 5 mg by mouth at bedtime.       Discharge Assessment: Vitals:   11/18/15 2301 11/19/15 0424  BP: 93/75 111/80   Pulse: 68 78  Resp:  18  Temp:  98.2 F (36.8 C)   Skin clean, dry and intact without evidence of skin break down, no evidence of skin tears noted. IV catheter discontinued intact. Site without signs and symptoms of complications - no redness or edema noted at insertion site, patient denies c/o pain - only slight tenderness at site.  Dressing with slight pressure applied.  D/c Instructions-Education: Discharge instructions given to patient/family with verbalized understanding. D/c education completed with patient/family including follow up instructions, medication list, d/c activities limitations if indicated, with other d/c instructions as indicated by MD - patient able to verbalize understanding, all questions fully answered. Patient instructed to return to ED, call 911, or call MD for any changes in condition.  Patient escorted via [place], and D/C home via private auto.  Dayle Points, RN 11/19/2015 2:30 PM

## 2015-11-19 NOTE — Consult Note (Signed)
   [facility] CM Inpatient Consult   11/19/2015  Ethan Wade 07/20/1950 [id number]  Referral received to assess for care management services.    Met with the patient regarding the benefits of [place] [facility] services. Explained that [place] [facility] is a covered benefit of insurance. Review information for [facility] Care Management and a folder was provided with contact information.  Chart was reviewed and this is the patient's 6th admission in the passed 6 months. Patient's chart reveals that patient will continue to pursue active treatment with [facility].   Explained that [facility] does not interfere with or replace any services arranged by the inpatient care management staff.  Patient declined services with [facility] and states, "I feel that I have all that I need with [place], right now."  Patient did accept a brochure and contact information.  Patient was encouraged to call for additional needs in community and for resources.  For questions, please contact:  Natividad Brood, RN BSN [facility] Liaison  [phone] business mobile phone Toll free office [phone]

## 2015-11-19 NOTE — Progress Notes (Signed)
Occupational Therapy Treatment Patient Details Name: Ethan Wade MRN: [id number] DOB: Jun 28, 1950 Today's Date: 11/19/2015    History of present illness Pt adm with hypotension due to dehydration. Pt with alcoholic cirrhosis and hep C, DM, liver CA.   OT comments  Upon entering the room, pt seated in recliner chair with brother present during session. Pt with no c/o pain this session. Pt stating, " I get more tired than usual and I don't have the best quality of life." OT provided paper handout regarding general principles of energy conservation as well as for self care tasks. OT educating pt on topic and pt verbalizing understanding. OT also educating pt on home health OT expectations and requested that pt and brother advocate for needs when returning home in regards to therapeutic intervention. Pt remained in chair at end of session with all needs within reach.   Follow Up Recommendations  Home health OT    Equipment Recommendations  None recommended by OT    Recommendations for Other Services      Precautions / Restrictions Precautions Precautions: Fall Restrictions Weight Bearing Restrictions: No       Mobility Bed Mobility      General bed mobility comments: in chair upon arrival     Balance   Sitting-balance support: Feet supported Sitting balance-Leahy Scale: Good                          Cognition   Behavior During Therapy: WFL for tasks assessed/performed Overall Cognitive Status: Within Functional Limits for tasks assessed                     Pertinent Vitals/ Pain       Pain Assessment: No/denies pain         Frequency Min 2X/week     Progress Toward Goals  OT Goals(current goals can now be found in the care plan section)  Progress towards OT goals: Progressing toward goals     Plan Discharge plan remains appropriate       End of Session     Activity Tolerance Patient tolerated treatment well   Patient Left in chair;with  call bell/phone within reach;with family/visitor present   Nurse Communication          Time: [id number] OT Time Calculation (min): 11 min  Charges: OT General Charges $OT Visit: 1 Procedure OT Treatments $Therapeutic Activity: 8-22 mins  Benay Pillow L 11/19/2015, 1:04 PM

## 2015-11-19 NOTE — Progress Notes (Signed)
[facility] - 5W-01   [facility] RN Note   Received request from Cephus Shelling, [facility] of patient/family interest in hospice services at discharge.   Chart reviewed.  Met with patient and his brother at the bedside to provide education and discuss services.  Patient did verbalize that he plans to continue with active treatments,( Nexavar) for his cancer at [facility] .  Patient is pursuing aggressive curative treatments for his cancer.  I did leave contact information for patient to contact us when curative treatments are completed and hospice can be initiated at that time.   CSW made aware.   Thank you for this referral.   Mickie Kay, [facility] Liaison  [phone]

## 2015-11-19 NOTE — Care Management Important Message (Signed)
Important Message  Patient Details  Name: Ethan Wade MRN: [id number] Date of Birth: 04/07/1950   Medicare Important Message Given:  Yes    Loann Quill 11/19/2015, 10:18 AM

## 2015-11-19 NOTE — Discharge Summary (Signed)
Physician Discharge Summary  Ethan Wade [name] DOB: 1951-03-28 DOA: 11/15/2015  PCP: Ethan Heck, MD  Admit date: 11/15/2015 Discharge date: 11/19/2015  Time spent:35 minutes  Recommendations for Outpatient Follow-up:  Hypotension of unclear etiology -Baseline BP 90-100s. Most likely secondary to dehydration is resolved with hydration -Schedule follow-up with Dr. Leighton Ruff for 1 week, hypotension, [place], liver cirrhosis  AKI ( - baseline creatinine 0.9-1.2) - resolved -Renal function has recovered with volume resuscitation  - renal ultrasound without acute findings  Recent Labs Lab 11/15/15 1647 11/16/15 0519 11/17/15 0458 11/18/15 0225  CREATININE 2.24* 1.89* 1.45* 0.95     Hyponatremia -Due to liver disease - stable   Hyperkalemia -Resolved    DM Type 2 -6/24 Hemoglobin A1c= 5.5 -Lantus 8 units daily  Chronic cirrhosis with varices and HCC -Schedule follow-up in 1-2 weeks with Dr. Birdie Hopes w/ [facility]  -Schedule follow-up in 1-2 weeks with Dr. Leamon Arnt w/ [facility] usual home meds as able - in the setting of dehydration discontinues lactulose and attempting to control encephalopathy with Xifaxan alone   Chronic thrombocytopenia - Low but no sign of acute bleeding, secondary to liver failure   Prolonged QTc -8/20 2 repeat EKG QT prolongation resolved.   Severe malnutrition in context of chronic illness    Discharge Diagnoses:  Principal Problem:   Sepsis ([place]) Active Problems:   Thrombocytopenia (HCC)   Chronic hepatitis C with cirrhosis (HCC)   Diabetes type 2, controlled ([place])   [place] (hepatocellular carcinoma) ([place])   Hypotension   Protein-calorie malnutrition, severe   AKI (acute kidney injury) ([place])   Prolonged QT interval   Acute renal failure (ARF) ([place])   Controlled diabetes mellitus type 2 with complications [facility])   Discharge Condition: Stable  Diet recommendation: Heart healthy/American  diabetic Association  Filed Weights   11/15/15 1629 11/16/15 0658 11/19/15 0424  Weight: 57.2 kg (126 lb) 61.7 kg (136 lb 0.4 oz) 66 kg (145 lb 9.6 oz)    History of present illness:  [name] PMHx Hepatitis C and EtOH Cirrhosis, HCC on sorafenib followed at [facility], frequent Hepatic Encephalopathy, and DMtype 2 controlled with complications   Who presented with hypotension.  The patient was in his usual state of health until 1 day prior to his admit when he started to feel more weak and tired than usual. He had decreased appetite and decreased UOP, w/ emesis x1. He went to see his PCP who found him to have SBP 70 mmHg and sent him to the ER.  In the ED he had a heart rate in the 80s, respirations 20s, BP 80/66, and no hypoxia.  He was mentating normally.  Na 129 (baseline 135), K 5.2, Cr 2.24(baseline 0.9-1.2), WBC 5.3K, Hgb 16.  Lactic acid 5.9 --> 5.7 --> 4.7 with 30 cc/kg bolus.  Vanc and Zosyn were started empirically and the patient was evaluated by CCM who declared him stable for SDU. During this hospitalization patient was treated for hypotension most likely secondary to dehydration, electrolyte abnormalities and worsening liver cirrhosis. After patient was rehydrated and electrolytes corrected hypotension and encephalopathy resolved.    Consultants:  PCCM  Procedures: none  Antimicrobials:  Vancomycin 8/18 1 dose Zosyn 8/18 1 dose Cefepime 8/19 1 dose     Discharge Exam: Vitals:   11/18/15 1432 11/18/15 2210 11/18/15 2301 11/19/15 0424  BP: 97/60 (!) 88/62 93/75 111/80  Pulse: 60 69 68 78  Resp: 19 18  18   Temp:  98.4  F (36.9 C)  98.2 F (36.8 C)  TempSrc:  Oral  Oral  SpO2: 100% 96%  93%  Weight:    66 kg (145 lb 9.6 oz)  Height:        General: A/O 4, NAD, Cardiovascular: Regular rhythm and rate, negative murmurs rubs gallops, normal S1/S2 Respiratory: Clear to auscultation bilateral  Discharge Instructions     Medication List    STOP  taking these medications   dicyclomine 10 MG capsule Commonly known as:  BENTYL   furosemide 40 MG tablet Commonly known as:  LASIX   lactulose 10 GM/15ML solution Commonly known as:  CHRONULAC   spironolactone 100 MG tablet Commonly known as:  ALDACTONE     TAKE these medications   CLEAR EYES FOR DRY EYES 1-0.25 % Soln Generic drug:  Carboxymethylcellul-Glycerin Apply 1-2 drops to eye daily as needed (for dry eyes). Reported on 05/20/2015   feeding supplement (ENSURE ENLIVE) Liqd Take 237 mLs by mouth 2 (two) times daily between meals.   insulin glargine 100 UNIT/ML injection Commonly known as:  LANTUS Inject 0.08 mLs (8 Units total) into the skin daily. What changed:  how much to take   nadolol 20 MG tablet Commonly known as:  CORGARD Take 1 tablet (20 mg total) by mouth daily.   oxyCODONE 5 MG immediate release tablet Commonly known as:  Oxy IR/ROXICODONE Take 1 tablet (5 mg total) by mouth every 12 (twelve) hours as needed for severe pain or breakthrough pain (only for severe or breakthrough pain).   SORAfenib 200 MG tablet Commonly known as:  NEXAVAR Take 400 mg by mouth every 12 (twelve) hours. Take on an empty stomach 1 hour before or 2 hours after meals.   Vitamin D (Ergocalciferol) 50000 units Caps capsule Commonly known as:  DRISDOL Take 50,000 Units by mouth every 7 (seven) days.   XIFAXAN 550 MG Tabs tablet Generic drug:  rifaximin Take 550 mg by mouth 2 (two) times daily.   zolpidem 5 MG tablet Commonly known as:  AMBIEN Take 5 mg by mouth at bedtime.      No Known Allergies Follow-up Information    [facility] .   Why:   Resume HHRN,PT, aide Contact information: [address] [place] [address] [phone]        Ethan Cos, MD. Schedule an appointment as soon as possible for a visit in 2 week(s).   Specialty:  Gastroenterology Why:  Schedule follow-up in 1-2 weeks with Dr. Gershon Wade w/ [facility] Contact information: [place] [address] [phone]        Ethan Heck, MD. Schedule an appointment as soon as possible for a visit in 1 week(s).   Specialty:  Family Medicine Why:  Schedule follow-up with Dr. Leighton Ruff for 1 week, hypotension, [place], liver cirrhosis Contact information: [place] [address] [phone]            The results of significant diagnostics from this hospitalization (including imaging, microbiology, ancillary and laboratory) are listed below for reference.    Significant Diagnostic Studies: US Renal  Result Date: 11/16/2015 CLINICAL DATA:  Acute renal failure EXAM: RENAL / URINARY TRACT ULTRASOUND COMPLETE COMPARISON:  CT abdomen and pelvis November 10, 2013 FINDINGS: Right Kidney: Length: 11.3 cm. Echogenicity and renal cortical thickness are within normal limits. No perinephric fluid or hydronephrosis visualized. There is a cyst arising from the lower pole the right kidney measuring  2.3 x 1.6 x 1.4 cm. No sonographically demonstrable calculus or ureterectasis. Left Kidney: Length: 10.8 cm. Echogenicity and renal cortical thickness are within normal limits. No mass, perinephric fluid, or hydronephrosis visualized. No sonographically demonstrable calculus or ureterectasis. Bladder: Appears normal for degree of bladder distention. Trace ascites noted. IMPRESSION: Cyst arising from lower pole of right kidney. Study otherwise unremarkable except for trace ascites. Electronically Signed   By: Lowella Grip III M.D.   On: 11/16/2015 08:51   US Abdomen Limited  Result Date: 10/22/2015 CLINICAL DATA:  Abdominal distention, evaluate for ascites EXAM: LIMITED ABDOMEN ULTRASOUND FOR ASCITES TECHNIQUE: Limited ultrasound survey for ascites was performed in all four abdominal quadrants. COMPARISON:  Ultrasound of the abdomen of 04/16/2015 FINDINGS: There is a small amount of ascites  primarily in the right upper quadrant, but no other collection is seen. The volume was not considered adequate for paracentesis to be performed. IMPRESSION: Small amount of ascites in the right upper quadrant. No paracentesis was performed. Electronically Signed   By: Ivar Drape M.D.   On: 10/22/2015 10:51  Dg Chest Port 1 View  Result Date: 11/15/2015 CLINICAL DATA:  Hypotension, history of liver cancer EXAM: PORTABLE CHEST 1 VIEW COMPARISON:  10/07/2015 FINDINGS: The heart size and mediastinal contours are within normal limits. Both lungs are clear. The visualized skeletal structures are unremarkable. IMPRESSION: No active disease. Electronically Signed   By: Lahoma Crocker M.D.   On: 11/15/2015 18:47    Microbiology: Recent Results (from the past 240 hour(s))  Blood Culture (routine x 2)     Status: None (Preliminary result)   Collection Time: 11/15/15  5:40 PM  Result Value Ref Range Status   Specimen Description BLOOD LEFT ANTECUBITAL  Final   Special Requests BOTTLES DRAWN AEROBIC AND ANAEROBIC 5CC  Final   Culture NO GROWTH 3 DAYS  Final   Report Status PENDING  Incomplete  Blood Culture (routine x 2)     Status: None (Preliminary result)   Collection Time: 11/15/15  5:42 PM  Result Value Ref Range Status   Specimen Description BLOOD RIGHT FOREARM  Final   Special Requests BOTTLES DRAWN AEROBIC AND ANAEROBIC 5CC  Final   Culture NO GROWTH 3 DAYS  Final   Report Status PENDING  Incomplete  Urine culture     Status: None   Collection Time: 11/15/15 11:18 PM  Result Value Ref Range Status   Specimen Description URINE, RANDOM  Final   Special Requests NONE  Final   Culture NO GROWTH  Final   Report Status 11/17/2015 FINAL  Final  MRSA PCR Screening     Status: None   Collection Time: 11/16/15  7:05 AM  Result Value Ref Range Status   MRSA by PCR NEGATIVE NEGATIVE Final    Comment:        The GeneXpert MRSA Assay (FDA approved for NASAL specimens only), is one component of  a comprehensive MRSA colonization surveillance program. It is not intended to diagnose MRSA infection nor to guide or monitor treatment for MRSA infections.      Labs: Basic Metabolic Panel:  Recent Labs Lab 11/15/15 1647 11/16/15 0519 11/17/15 0458 11/18/15 0225  NA 129* 132* 131* 133*  K 5.2* 4.3 3.9 4.1  CL 93* 100* 100* 107  CO2 23 22 19* 19*  GLUCOSE 280* 144* 220* 141*  BUN 24* 25* 25* 21*  CREATININE 2.24* 1.89* 1.45* 0.95  CALCIUM 9.7 8.6* 8.2* 7.9*   Liver Function Tests:  Recent  Labs Lab 11/15/15 1647 11/16/15 0519 11/17/15 0458  AST 54* 43* 43*  ALT 35 28 28  ALKPHOS 81 57 63  BILITOT 4.2* 2.9* 1.9*  PROT 6.4* 5.3* 5.2*  ALBUMIN 2.7* 2.4* 2.2*   No results for input(s): LIPASE, AMYLASE in the last 168 hours.  Recent Labs Lab 11/15/15 1644  AMMONIA 36*   CBC:  Recent Labs Lab 11/15/15 1647 11/16/15 0519 11/17/15 0458 11/18/15 0225  WBC 5.3 5.0 4.8 4.1  NEUTROABS 3.8  --   --   --   HGB 16.7 13.2 13.7 13.2  HCT 48.4 38.7* 40.4 40.2  MCV 105.4* 105.2* 106.0* 106.3*  PLT 86* 50* 42* 36*   Cardiac Enzymes: No results for input(s): CKTOTAL, CKMB, CKMBINDEX, TROPONINI in the last 168 hours. BNP: BNP (last 3 results) No results for input(s): BNP in the last 8760 hours.  ProBNP (last 3 results) No results for input(s): PROBNP in the last 8760 hours.  CBG:  Recent Labs Lab 11/18/15 0838 11/18/15 1241 11/18/15 1803 11/18/15 2152 11/19/15 0808  GLUCAP 159* 189* 248* 179* 149*       Signed:  Dia Crawford, MD [facility] [phone] pager

## 2015-11-20 LAB — CULTURE, BLOOD (ROUTINE X 2)
CULTURE: NO GROWTH
Culture: NO GROWTH

## 2015-12-24 ENCOUNTER — Other Ambulatory Visit (HOSPITAL_COMMUNITY): Payer: Self-pay | Admitting: Gastroenterology

## 2015-12-24 DIAGNOSIS — R140 Abdominal distension (gaseous): Secondary | ICD-10-CM

## 2016-01-02 ENCOUNTER — Ambulatory Visit (HOSPITAL_COMMUNITY): Payer: Medicare Other

## 2016-01-06 ENCOUNTER — Encounter: Payer: Self-pay | Admitting: Adult Health

## 2016-01-06 ENCOUNTER — Non-Acute Institutional Stay (SKILLED_NURSING_FACILITY): Payer: Medicare Other | Admitting: Adult Health

## 2016-01-06 DIAGNOSIS — K7460 Unspecified cirrhosis of liver: Secondary | ICD-10-CM | POA: Diagnosis not present

## 2016-01-06 DIAGNOSIS — B182 Chronic viral hepatitis C: Secondary | ICD-10-CM | POA: Diagnosis not present

## 2016-01-06 DIAGNOSIS — R627 Adult failure to thrive: Secondary | ICD-10-CM

## 2016-01-06 DIAGNOSIS — E1165 Type 2 diabetes mellitus with hyperglycemia: Secondary | ICD-10-CM

## 2016-01-06 DIAGNOSIS — D696 Thrombocytopenia, unspecified: Secondary | ICD-10-CM | POA: Diagnosis not present

## 2016-01-06 DIAGNOSIS — N400 Enlarged prostate without lower urinary tract symptoms: Secondary | ICD-10-CM | POA: Diagnosis not present

## 2016-01-06 DIAGNOSIS — E1151 Type 2 diabetes mellitus with diabetic peripheral angiopathy without gangrene: Secondary | ICD-10-CM | POA: Insufficient documentation

## 2016-01-06 DIAGNOSIS — IMO0002 Reserved for concepts with insufficient information to code with codable children: Secondary | ICD-10-CM

## 2016-01-06 DIAGNOSIS — R188 Other ascites: Secondary | ICD-10-CM

## 2016-01-06 DIAGNOSIS — C220 Liver cell carcinoma: Secondary | ICD-10-CM | POA: Diagnosis not present

## 2016-01-06 NOTE — Progress Notes (Signed)
Location:    [facility]  Nursing Home Room Number: 146-A Place of Service:  SNF (31)   CODE STATUS: full code at this time.   No Known Allergies  Chief Complaint  Patient presents with  . Hospitalization Follow-up    Hospital Follow up    HPI:  He has a history of diabetes; hepatitis c;cirrhosis and liver cancer. He was hospitalized for uncontrolled glucose and low sodium. He is unable to participate in the hpi or ros. His family is concerned that his ammonia level is elevated as he is not able to answer questions. He is here for short term rehab with his goal to return back home. At this time I do not see him able to return back home. There are no nursing concerns at this time,    Past Medical History:  Diagnosis Date  . AKI (acute kidney injury) ([place]) 11/15/2015  . Arthritis    "joint stiffness in the knees" (11/10/2013)  . Ascites   . Esophageal bleed, non-variceal 2006  . Heart murmur   . Hepatic encephalopathy ([place]) 05/2015  . Hepatitis C 1980's  . HTN (hypertension)   . Liver cancer ([place])   . Macrocytosis   . Mitral valve prolapse    OCCAS FLUTTER FEELING  . Prostate enlargement    PT TOLD "NORMAL FOR AGE" - TAKES FINASTERIDE AND FLOMAX  . Psoriasis   . Rheumatic fever 1950's  . Thrombocytopenia ([place])   . Type II diabetes mellitus ([place])   . Umbilical hernia    OCCAS DISCOMFORT  . Vitamin D deficiency     Past Surgical History:  Procedure Laterality Date  . HERNIA REPAIR    . INSERTION OF MESH  03/09/2012   Procedure: INSERTION OF MESH;  Surgeon: Edward Jolly, MD;  Location: [facility];  Service: General;  Laterality: N/A;  . TONSILECTOMY, ADENOIDECTOMY, BILATERAL MYRINGOTOMY AND TUBES  child  . TONSILLECTOMY    . UMBILICAL HERNIA REPAIR  03/09/2012   Procedure: HERNIA REPAIR UMBILICAL ADULT;  Surgeon: Edward Jolly, MD;  Location: [facility];  Service: General;  Laterality: N/A;  Repair Umbilical Hernia with Mesh    Social History   Social  History  . Marital status: Single    Spouse name: N/A  . Number of children: N/A  . Years of education: N/A   Occupational History  . Not on file.   Social History Main Topics  . Smoking status: Former Smoker    Years: 20.00    Types: Cigars    Start date: 12/22/2005  . Smokeless tobacco: Former Systems developer    Types: Snuff     Comment: "quit smoking ~ 2009; quit snuff in ~ 2013"  . Alcohol use Yes     Comment: "quit in late April 2015"  . Drug use:      Comment: "tried IV drugs a couple times in the 1960's or 1970's; nothing since"  . Sexual activity: No   Other Topics Concern  . Not on file   Social History Narrative  . No narrative on file   Family History  Problem Relation Age of Onset  . COPD Mother   . Heart disease Father   . Diabetes Father   . Alcohol abuse Father   . Prostate cancer Brother   . Liver cancer Brother   . Colon cancer Brother   . COPD Maternal Grandfather       VITAL SIGNS BP (!) 100/58   Pulse 88  Temp 97.9 F (36.6 C) (Oral)   Resp 20   Ht 5\' 10"  (1.778 m)   Wt 146 lb 4 oz (66.3 kg)   SpO2 97%   BMI 20.98 kg/m   Patient's Medications  New Prescriptions   No medications on file  Previous Medications   CARBOXYMETHYLCELLUL-GLYCERIN (CLEAR EYES FOR DRY EYES) 1-0.25 % SOLN    Apply 1-2 drops to eye daily as needed (for dry eyes). Reported on 05/20/2015   DRONABINOL (MARINOL) 2.5 MG CAPSULE    Take 2.5 mg by mouth 2 (two) times daily before a meal.   FUROSEMIDE (LASIX) 20 MG TABLET    Take 40 mg by mouth 2 (two) times daily.   IBUPROFEN (ADVIL,MOTRIN) 600 MG TABLET    Take 600 mg by mouth every 6 (six) hours as needed.   INSULIN GLARGINE (LANTUS) 100 UNIT/ML INJECTION    Inject 0.08 mLs (8 Units total) into the skin daily.   INSULIN LISPRO (HUMALOG) 100 UNIT/ML INJECTION    Inject into the skin. Inject as per sliding scale: if  70-150= 0  ; 151-200 = 0 units, 201-250= 3 units; 251-300= 5 units; 301-350= 7 units.  IF CBG  < 70 treat per  protocol. If >35 notify MD, subcutaneously before meals and at bedtime related to DM.   LACTULOSE (CHRONULAC) 10 GM/15ML SOLUTION    Take 10 g by mouth 2 (two) times daily.   NADOLOL (CORGARD) 20 MG TABLET    Take 1 tablet (20 mg total) by mouth daily.   OXYCODONE (OXY IR/ROXICODONE) 5 MG IMMEDIATE RELEASE TABLET    Take 1 tablet (5 mg total) by mouth every 12 (twelve) hours as needed for severe pain or breakthrough pain (only for severe or breakthrough pain).   SPIRONOLACTONE (ALDACTONE) 50 MG TABLET    Take 50 mg by mouth 2 (two) times daily.   TAMSULOSIN (FLOMAX) 0.4 MG CAPS CAPSULE    Take 0.4 mg by mouth daily.   VITAMIN D, ERGOCALCIFEROL, (DRISDOL) 50000 UNITS CAPS CAPSULE    Take 50,000 Units by mouth every 7 (seven) days.   XIFAXAN 550 MG TABS TABLET    Take 550 mg by mouth 2 (two) times daily.   ZOLPIDEM (AMBIEN) 5 MG TABLET    Take 5 mg by mouth at bedtime.  Modified Medications   No medications on file  Discontinued Medications   FEEDING SUPPLEMENT, ENSURE ENLIVE, (ENSURE ENLIVE) LIQD    Take 237 mLs by mouth 2 (two) times daily between meals.   SORAFENIB (NEXAVAR) 200 MG TABLET    Take 400 mg by mouth every 12 (twelve) hours. Take on an empty stomach 1 hour before or 2 hours after meals.     SIGNIFICANT DIAGNOSTIC EXAMS  10-21-15: ct of chest abdomen and pelvis: 1. No suspicious pulmonary nodules or masses present. Sub-centimeter thyroid nodules again seen. Stable prominence of ascending thoracic aorta 4.1 cm. Pulmonary artery appear normal. No mediastinal or hilar lymphadenopathy. Osseous structures appear unremarkable.   2. Several new LI-RADS 4 lesions throughout liver 3. New enhancement at previous LI-RADS TR site in segment 7 is now viable 4. Stable LI RADS TR viable lesion segment 6  5. 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  catechetic   Eyes: Conjunctivae are normal.  Neck: Neck supple. No JVD present. No thyromegaly present.  Cardiovascular: Normal rate and regular rhythm.   Post tibs and pedal pulses unable to palpate   Respiratory: Effort normal and breath sounds normal. No respiratory distress. He has no wheezes.  GI: Soft. Bowel sounds are normal.  Significant ascites present 01-02-16: status post paracentesis of 6 liters   Musculoskeletal: He exhibits no edema.  Able to move all extremities   Lymphadenopathy:    He has no cervical adenopathy.  Neurological:  He is aware of his surroundings   Skin: Skin is warm and dry. He is not diaphoretic.  Skin frail Lower extremities discolored       ASSESSMENT/ PLAN:  1. Diabetes: hgb a1c 10.4: he was not taking his insulin at home. Will continue lantus 16 units nightly; humalog SSI: 201-250: 3 units; 251-300: 5 units; 301-350: 7 units. Will monitor  2. Portal hypertension: will continue nadolol 20 mg daily will monitor  3. BPH: will continue flomax daily   4. Vit D deficiency: will continue vit d 50,000 units weekly   5. [place]; HCV; Cirrhosis: will continue lactulose 15 gm twice daily; rifaximin 550 mg twice daily  is followed by oncology is status post paracentesis 6 liter on 01-02-16; has oxycodone 5 mg every 6 hours as needed for pain   6. Ascites: will continue spironolactone 50 mg twice daily and lasix 20 mg twice daily   7. Thrombocytopenia: plt is 54; will monitor   8. Hyponatremia: sodium is 128; will monitor  9. FTT: will continue marinol 2.5 mg twice daily with a meal and will monitor his status. His current weight is 146 pounds   Will check  cbc; cmp; ammonia level    Time spent with patient  50  minutes >50% time spent counseling; reviewing medical record; tests; labs; and developing future plan of care   MD is aware of resident's narcotic use and is in agreement with current plan of care. We will attempt to wean resident as apropriate   Ok Edwards NP [facility]  Contact [phone] Monday through Friday 8am- 5pm  After hours call [phone]

## 2016-01-06 NOTE — Progress Notes (Signed)
Patient ID: Ethan Wade, male   DOB: 09-Mar-1951, 65 y.o.   MRN: [id number]   Location:   [place] Room Number: 146-A Place of Service:  SNF (31)   CODE STATUS: Full Code  No Known Allergies  Chief Complaint  Patient presents with  . Hospitalization Follow-up    Hospital Follow up    HPI:    Past Medical History:  Diagnosis Date  . AKI (acute kidney injury) ([place]) 11/15/2015  . Arthritis    "joint stiffness in the knees" (11/10/2013)  . Ascites   . Esophageal bleed, non-variceal 2006  . Heart murmur   . Hepatic encephalopathy ([place]) 05/2015  . Hepatitis C 1980's  . HTN (hypertension)   . Liver cancer ([place])   . Macrocytosis   . Mitral valve prolapse    OCCAS FLUTTER FEELING  . Prostate enlargement    PT TOLD "NORMAL FOR AGE" - TAKES FINASTERIDE AND FLOMAX  . Psoriasis   . Rheumatic fever 1950's  . Thrombocytopenia ([place])   . Type II diabetes mellitus ([place])   . Umbilical hernia    OCCAS DISCOMFORT  . Vitamin D deficiency     Past Surgical History:  Procedure Laterality Date  . HERNIA REPAIR    . INSERTION OF MESH  03/09/2012   Procedure: INSERTION OF MESH;  Surgeon: Edward Jolly, MD;  Location: [facility];  Service: General;  Laterality: N/A;  . TONSILECTOMY, ADENOIDECTOMY, BILATERAL MYRINGOTOMY AND TUBES  child  . TONSILLECTOMY    . UMBILICAL HERNIA REPAIR  03/09/2012   Procedure: HERNIA REPAIR UMBILICAL ADULT;  Surgeon: Edward Jolly, MD;  Location: [facility];  Service: General;  Laterality: N/A;  Repair Umbilical Hernia with Mesh    Social History   Social History  . Marital status: Single    Spouse name: N/A  . Number of children: N/A  . Years of education: N/A   Occupational History  . Not on file.   Social History Main Topics  . Smoking status: Former Smoker    Years: 20.00    Types: Cigars    Start date: 12/22/2005  . Smokeless tobacco: Former Systems developer    Types: Snuff     Comment: "quit smoking ~ 2009; quit snuff in ~ 2013"    . Alcohol use Yes     Comment: "quit in late April 2015"  . Drug use:      Comment: "tried IV drugs a couple times in the 1960's or 1970's; nothing since"  . Sexual activity: No   Other Topics Concern  . Not on file   Social History Narrative  . No narrative on file   Family History  Problem Relation Age of Onset  . COPD Mother   . Heart disease Father   . Diabetes Father   . Alcohol abuse Father   . Prostate cancer Brother   . Liver cancer Brother   . Colon cancer Brother   . COPD Maternal Grandfather       VITAL SIGNS BP (!) 100/58   Pulse 88   Temp 97.9 F (36.6 C) (Oral)   Resp 20   Ht 5\' 10"  (1.778 m)   Wt 146 lb 4 oz (66.3 kg)   SpO2 97%   BMI 20.98 kg/m   Patient's Medications  New Prescriptions   No medications on file  Previous Medications   CARBOXYMETHYLCELLUL-GLYCERIN (CLEAR EYES FOR DRY EYES) 1-0.25 % SOLN    Apply 1-2 drops to eye daily as needed (  for dry eyes). Reported on 05/20/2015   DRONABINOL (MARINOL) 2.5 MG CAPSULE    Take 2.5 mg by mouth 2 (two) times daily before a meal.   FUROSEMIDE (LASIX) 20 MG TABLET    Take 40 mg by mouth 2 (two) times daily.   IBUPROFEN (ADVIL,MOTRIN) 600 MG TABLET    Take 600 mg by mouth every 6 (six) hours as needed.   INSULIN GLARGINE (LANTUS) 100 UNIT/ML INJECTION    Inject 0.08 mLs (8 Units total) into the skin daily.   INSULIN LISPRO (HUMALOG) 100 UNIT/ML INJECTION    Inject into the skin. Inject as per sliding scale: if  70-150= 0  ; 151-200 = 0 units, 201-250= 3 units; 251-300= 5 units; 301-350= 7 units.  IF CBG  < 70 treat per protocol. If >35 notify MD, subcutaneously before meals and at bedtime related to DM.   LACTULOSE (CHRONULAC) 10 GM/15ML SOLUTION    Take 10 g by mouth 2 (two) times daily.   NADOLOL (CORGARD) 20 MG TABLET    Take 1 tablet (20 mg total) by mouth daily.   OXYCODONE (OXY IR/ROXICODONE) 5 MG IMMEDIATE RELEASE TABLET    Take 1 tablet (5 mg total) by mouth every 12 (twelve) hours as needed for  severe pain or breakthrough pain (only for severe or breakthrough pain).   SPIRONOLACTONE (ALDACTONE) 50 MG TABLET    Take 50 mg by mouth 2 (two) times daily.   TAMSULOSIN (FLOMAX) 0.4 MG CAPS CAPSULE    Take 0.4 mg by mouth daily.   VITAMIN D, ERGOCALCIFEROL, (DRISDOL) 50000 UNITS CAPS CAPSULE    Take 50,000 Units by mouth every 7 (seven) days.   XIFAXAN 550 MG TABS TABLET    Take 550 mg by mouth 2 (two) times daily.   ZOLPIDEM (AMBIEN) 5 MG TABLET    Take 5 mg by mouth at bedtime.  Modified Medications   No medications on file  Discontinued Medications   FEEDING SUPPLEMENT, ENSURE ENLIVE, (ENSURE ENLIVE) LIQD    Take 237 mLs by mouth 2 (two) times daily between meals.   SORAFENIB (NEXAVAR) 200 MG TABLET    Take 400 mg by mouth every 12 (twelve) hours. Take on an empty stomach 1 hour before or 2 hours after meals.     SIGNIFICANT DIAGNOSTIC EXAMS       ASSESSMENT/ PLAN:    MD is aware of resident's narcotic use and is in agreement with current plan of care. We will attempt to wean resident as apropriate   Ok Edwards NP [facility]  Contact [phone] Monday through Friday 8am- 5pm  After hours call [phone]

## 2016-01-07 ENCOUNTER — Encounter: Payer: Self-pay | Admitting: Internal Medicine

## 2016-01-07 ENCOUNTER — Non-Acute Institutional Stay (SKILLED_NURSING_FACILITY): Payer: Medicare Other | Admitting: Internal Medicine

## 2016-01-07 ENCOUNTER — Other Ambulatory Visit: Payer: Self-pay

## 2016-01-07 DIAGNOSIS — K7682 Hepatic encephalopathy: Secondary | ICD-10-CM

## 2016-01-07 DIAGNOSIS — E1165 Type 2 diabetes mellitus with hyperglycemia: Secondary | ICD-10-CM

## 2016-01-07 DIAGNOSIS — D696 Thrombocytopenia, unspecified: Secondary | ICD-10-CM | POA: Diagnosis not present

## 2016-01-07 DIAGNOSIS — K7290 Hepatic failure, unspecified without coma: Secondary | ICD-10-CM | POA: Diagnosis not present

## 2016-01-07 DIAGNOSIS — N400 Enlarged prostate without lower urinary tract symptoms: Secondary | ICD-10-CM

## 2016-01-07 DIAGNOSIS — R627 Adult failure to thrive: Secondary | ICD-10-CM

## 2016-01-07 DIAGNOSIS — D638 Anemia in other chronic diseases classified elsewhere: Secondary | ICD-10-CM | POA: Diagnosis not present

## 2016-01-07 DIAGNOSIS — E1151 Type 2 diabetes mellitus with diabetic peripheral angiopathy without gangrene: Secondary | ICD-10-CM | POA: Diagnosis not present

## 2016-01-07 DIAGNOSIS — R188 Other ascites: Secondary | ICD-10-CM

## 2016-01-07 DIAGNOSIS — K7460 Unspecified cirrhosis of liver: Secondary | ICD-10-CM

## 2016-01-07 DIAGNOSIS — IMO0002 Reserved for concepts with insufficient information to code with codable children: Secondary | ICD-10-CM

## 2016-01-07 DIAGNOSIS — C220 Liver cell carcinoma: Secondary | ICD-10-CM | POA: Diagnosis not present

## 2016-01-07 DIAGNOSIS — B182 Chronic viral hepatitis C: Secondary | ICD-10-CM

## 2016-01-07 MED ORDER — ZOLPIDEM TARTRATE 5 MG PO TABS: 5.0000 mg | ORAL_TABLET | Freq: Every day | ORAL | 0 refills | Status: AC

## 2016-01-07 MED ORDER — DRONABINOL 2.5 MG PO CAPS: 2.5000 mg | ORAL_CAPSULE | Freq: Two times a day (BID) | ORAL | 0 refills | Status: AC

## 2016-01-07 NOTE — Progress Notes (Signed)
Patient ID: Ethan Wade, male   DOB: 1951-02-13, 65 y.o.   MRN: [id number]    HISTORY AND PHYSICAL   DATE: 01/07/2016  Location:    [place] Room Number: 146 A Place of Service: SNF (31)   Extended Emergency Contact Information Primary Emergency Contact: Vawter,Charles Address: [address]          [place], [place] [address] Johnnette Litter of [place] Phone: [phone] Mobile Phone: [phone] Relation: Brother  Advanced Directive information Does patient have an advance directive?: No, Would patient like information on creating an advanced directive?: No - patient declined information  Chief Complaint  Patient presents with  . New Admit To SNF    HPI:  65 yo male seen today as a new admission into SNF following hospital stay at [facility] for [facility], cirrhosis of liver with ascites, hyponatremia, DM2 with hyperglycemia. He underwent paracentesis on 10/5th and 6L fluid removed followed by albumin infusion. He is on chemotx for [facility] and followed by [facility] oncology Dr Leamon Arnt. BS elevated at 581 in the ED. He was noncompliant with insulin regimen at home prior to admission. He was tx with insulin gtt --> lantus and SSI. Na 125 -->131 with NS. Plts 54K; Hgb 11.1. Tbili 2.6. He was noted to have a chronic inguinal hernia not amenable to surgical intervention per pt.  Today he reports no concerns. He is a poor historian due to mental status. Hx obtained from chart. Nursing reports increased confusion. NH3 level >200 per lab yesterday. Brother, Juanda Crumble, present and notes that pt had increased confusion over the last few days. He usually takes lactulose 30cc po BID. He has f/u appts with several specialists but not sure which ones as pt never got opportunity to tell brother before he became lethargic. He is followed by GI Dr Sharlett Iles in [place] but other specialists at [facility].  DM - uncontrolled. A1c 10.4%. He was noncompliant with insulin at  home. Currently on  lantus 16 units nightly; humalog SSI: 201-250: 3 units; 251-300: 5 units; 301-350: 7 units. 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 Takes nadolol 20 mg daily  BPH - stable on flomax daily   Vit D deficiency - takes  vit d 50,000 units weekly   HCC - hx HCV and Cirrhosis with ascites - takes lactulose 15 gm twice daily; rifaximin 550 mg twice daily; followed by oncology; underwent paracentesis with removal of 6 liter on 01-02-16 followed by albumin infusion; has oxycodone 5 mg every 6 hours as needed for pain; takes spironolactone 50 mg twice daily and lasix 20 mg twice daily. He gets therapeutic paracentesis q41mo. Tbili 2.6. He gets ambien to help him sleep.  Thrombocytopenia - plt 54K   AOCD - stable. Hgb 11.1 at d/c  Hyponatremia - due to liver cirrhosis. Sodium is 128  FTT/protein calorie malnutrition - takes marinol 2.5 mg twice daily with a meal. His current weight is 146 pounds. Albumin 2.5.   Past Medical History:  Diagnosis Date  . AKI ([place] kidney injury) ([place] 11/15/2015  . Arthritis    "joint stiffness in the knees" (11/10/2013)  . Ascites   . Esophageal bleed, non-variceal 2006  . Heart murmur   . Hepatic encephalopathy ([facility] 05/2015  . Hepatitis C 1980's  . HTN (hypertension)   . Liver cancer ([place]   . Macrocytosis   . Mitral valve prolapse    OCCAS FLUTTER FEELING  . Prostate  enlargement    PT TOLD "NORMAL FOR AGE" - TAKES FINASTERIDE AND FLOMAX  . Psoriasis   . Rheumatic fever 1950's  . Thrombocytopenia ([place])   . Type II diabetes mellitus ([place])   . Umbilical hernia    OCCAS DISCOMFORT  . Vitamin D deficiency     Past Surgical History:  Procedure Laterality Date  . HERNIA REPAIR    . INSERTION OF MESH  03/09/2012   Procedure: INSERTION OF MESH;  Surgeon: Edward Jolly, MD;  Location: [facility];  Service: General;  Laterality: N/A;  . TONSILECTOMY, ADENOIDECTOMY, BILATERAL MYRINGOTOMY AND TUBES  child  .  TONSILLECTOMY    . UMBILICAL HERNIA REPAIR  03/09/2012   Procedure: HERNIA REPAIR UMBILICAL ADULT;  Surgeon: Edward Jolly, MD;  Location: [facility];  Service: General;  Laterality: N/A;  Repair Umbilical Hernia with Mesh    Patient Care Team: Leighton Ruff, MD as PCP - General (Family Medicine)  Social History   Social History  . Marital status: Single    Spouse name: N/A  . Number of children: N/A  . Years of education: N/A   Occupational History  . Not on file.   Social History Main Topics  . Smoking status: Former Smoker    Years: 20.00    Types: Cigars    Start date: 12/22/2005  . Smokeless tobacco: Former Systems developer    Types: Snuff     Comment: "quit smoking ~ 2009; quit snuff in ~ 2013"  . Alcohol use Yes     Comment: "quit in late April 2015"  . Drug use:      Comment: "tried IV drugs a couple times in the 1960's or 1970's; nothing since"  . Sexual activity: No   Other Topics Concern  . Not on file   Social History Narrative  . No narrative on file     reports that he has quit smoking. His smoking use included Cigars. He started smoking about 10 years ago. He quit after 20.00 years of use. He has quit using smokeless tobacco. His smokeless tobacco use included Snuff. He reports that he drinks alcohol. He reports that he uses drugs.  Family History  Problem Relation Age of Onset  . COPD Mother   . Heart disease Father   . Diabetes Father   . Alcohol abuse Father   . Prostate cancer Brother   . Liver cancer Brother   . Colon cancer Brother   . COPD Maternal Grandfather    Family Status  Relation Status  . Mother Deceased  . Father Deceased  . Brother   . Brother   . Brother   . Maternal Grandfather     Immunization History  Administered Date(s) Administered  . PPD Test 01/03/2016    No Known Allergies  Medications: Patient's Medications  New Prescriptions   No medications on file  Previous Medications   CARBOXYMETHYLCELLUL-GLYCERIN  (CLEAR EYES FOR DRY EYES) 1-0.25 % SOLN    Apply 1-2 drops to eye daily as needed (for dry eyes). Reported on 05/20/2015   DRONABINOL (MARINOL) 2.5 MG CAPSULE    Take 2.5 mg by mouth 2 (two) times daily before a meal.   FUROSEMIDE (LASIX) 20 MG TABLET    Take 40 mg by mouth 2 (two) times daily.   IBUPROFEN (ADVIL,MOTRIN) 600 MG TABLET    Take 600 mg by mouth every 6 (six) hours as needed.   INSULIN GLARGINE (LANTUS) 100 UNIT/ML INJECTION    Inject 16 Units  into the skin at bedtime.   INSULIN LISPRO (HUMALOG) 100 UNIT/ML INJECTION    Inject into the skin. Inject as per sliding scale: if  70-150= 0  ; 151-200 = 0 units, 201-250= 3 units; 251-300= 5 units; 301-350= 7 units.  IF CBG  < 70 treat per protocol. If >35 notify MD, subcutaneously before meals and at bedtime related to DM.   LACTULOSE (CHRONULAC) 10 GM/15ML SOLUTION    Take 10 g by mouth 2 (two) times daily.   NADOLOL (CORGARD) 20 MG TABLET    Take 1 tablet (20 mg total) by mouth daily.   OXYCODONE HCL, ABUSE DETER, (OXAYDO) 5 MG TABA    Take 1 tablet by mouth every 6 (six) hours as needed.   SPIRONOLACTONE (ALDACTONE) 50 MG TABLET    Take 50 mg by mouth 2 (two) times daily.   TAMSULOSIN (FLOMAX) 0.4 MG CAPS CAPSULE    Take 0.4 mg by mouth daily.   VITAMIN D, ERGOCALCIFEROL, (DRISDOL) 50000 UNITS CAPS CAPSULE    Take 50,000 Units by mouth every 7 (seven) days.   XIFAXAN 550 MG TABS TABLET    Take 550 mg by mouth 2 (two) times daily.   ZOLPIDEM (AMBIEN) 5 MG TABLET    Take 5 mg by mouth at bedtime.  Modified Medications   No medications on file  Discontinued Medications   INSULIN GLARGINE (LANTUS) 100 UNIT/ML INJECTION    Inject 0.08 mLs (8 Units total) into the skin daily.   OXYCODONE (OXY IR/ROXICODONE) 5 MG IMMEDIATE RELEASE TABLET    Take 1 tablet (5 mg total) by mouth every 12 (twelve) hours as needed for severe pain or breakthrough pain (only for severe or breakthrough pain).    Review of Systems  Unable to perform ROS: Other (hepatic  encephalopathy)    Vitals:   01/07/16 0947  BP: 90/60  Pulse: (!) 57  Resp: 14  Temp: 97.4 F (36.3 C)  TempSrc: Oral  SpO2: 96%  Weight: 146 lb 6.4 oz (66.4 kg)  Height: 5' 10"  (1.778 m)   Body mass index is 21.01 kg/m.  Physical Exam  Constitutional: He appears well-developed. He appears lethargic.  Frail appearing, thin, sitting up in bed, lethargic, in NAD  HENT:  Mouth/Throat: Oropharynx is clear and moist.  MMM  Eyes: Pupils are equal, round, and reactive to light. No scleral icterus.  Neck: Neck supple. No JVD present. Carotid bruit is not present. No tracheal deviation present.  Cardiovascular: Normal rate, regular rhythm and intact distal pulses.  Exam reveals no gallop and no friction rub.   Murmur (1/6 SEM) heard. no distal LE swelling. No calf TTP  Pulmonary/Chest: Effort normal and breath sounds normal. He has no wheezes. He has no rales. He exhibits no tenderness.  Abdominal: Soft. Bowel sounds are normal. He exhibits distension, fluid wave and ascites. He exhibits no abdominal bruit, no pulsatile midline mass and no mass. There is hepatomegaly. There is no tenderness. There is no rebound and no guarding. A hernia (b/l inguinal) is present.  Lymphadenopathy:    He has no cervical adenopathy.  Neurological: He appears lethargic.  Skin: Skin is warm and dry. No rash noted.  No calluses on feet but does have b/l toenail dystrophy, thick and yellow  Psychiatric: He has a normal mood and affect. His behavior is normal.     Labs reviewed: Admission on 11/15/2015, Discharged on 11/19/2015  Component Date Value Ref Range Status  . Sodium 11/15/2015 129* 135 - 145  mmol/L Final  . Potassium 11/15/2015 5.2* 3.5 - 5.1 mmol/L Final  . Chloride 11/15/2015 93* 101 - 111 mmol/L Final  . CO2 11/15/2015 23  22 - 32 mmol/L Final  . Glucose, Bld 11/15/2015 280* 65 - 99 mg/dL Final  . BUN 11/15/2015 24* 6 - 20 mg/dL Final  . Creatinine, Ser 11/15/2015 2.24* 0.61 - 1.24 mg/dL  Final  . Calcium 11/15/2015 9.7  8.9 - 10.3 mg/dL Final  . Total Protein 11/15/2015 6.4* 6.5 - 8.1 g/dL Final  . Albumin 11/15/2015 2.7* 3.5 - 5.0 g/dL Final  . AST 11/15/2015 54* 15 - 41 U/L Final  . ALT 11/15/2015 35  17 - 63 U/L Final  . Alkaline Phosphatase 11/15/2015 81  38 - 126 U/L Final  . Total Bilirubin 11/15/2015 4.2* 0.3 - 1.2 mg/dL Final  . GFR calc non Af Amer 11/15/2015 29* >60 mL/min Final  . GFR calc Af Amer 11/15/2015 33* >60 mL/min Final   Comment: (NOTE) The eGFR has been calculated using the CKD EPI equation. This calculation has not been validated in all clinical situations. eGFR's persistently <60 mL/min signify possible Chronic Kidney Disease.   . Anion gap 11/15/2015 13  5 - 15 Final  . Color, Urine 11/16/2015 YELLOW  YELLOW Final  . APPearance 11/16/2015 CLOUDY* CLEAR Final  . Specific Gravity, Urine 11/16/2015 1.020  1.005 - 1.030 Final  . pH 11/16/2015 5.0  5.0 - 8.0 Final  . Glucose, UA 11/16/2015 NEGATIVE  NEGATIVE mg/dL Final  . Hgb urine dipstick 11/16/2015 NEGATIVE  NEGATIVE Final  . Bilirubin Urine 11/16/2015 NEGATIVE  NEGATIVE Final  . Ketones, ur 11/16/2015 NEGATIVE  NEGATIVE mg/dL Final  . Protein, ur 11/16/2015 NEGATIVE  NEGATIVE mg/dL Final  . Nitrite 11/16/2015 NEGATIVE  NEGATIVE Final  . Leukocytes, UA 11/16/2015 TRACE* NEGATIVE Final  . Specimen Description 11/17/2015 URINE, RANDOM   Final  . Special Requests 11/17/2015 NONE   Final  . Culture 11/17/2015 NO GROWTH   Final  . Report Status 11/17/2015 11/17/2015 FINAL   Final  . Lactic Acid, Venous 11/15/2015 5.90* 0.5 - 1.9 mmol/L Final  . Comment 11/15/2015 NOTIFIED PHYSICIAN   Final  . WBC 11/15/2015 5.3  4.0 - 10.5 K/uL Final  . RBC 11/15/2015 4.59  4.22 - 5.81 MIL/uL Final  . Hemoglobin 11/15/2015 16.7  13.0 - 17.0 g/dL Final  . HCT 11/15/2015 48.4  39.0 - 52.0 % Final  . MCV 11/15/2015 105.4* 78.0 - 100.0 fL Final  . MCH 11/15/2015 36.4* 26.0 - 34.0 pg Final  . MCHC 11/15/2015 34.5   30.0 - 36.0 g/dL Final  . RDW 11/15/2015 14.1  11.5 - 15.5 % Final  . Platelets 11/15/2015 86* 150 - 400 K/uL Final  . Neutrophils Relative % 11/15/2015 73  % Final  . Neutro Abs 11/15/2015 3.8  1.7 - 7.7 K/uL Final  . Lymphocytes Relative 11/15/2015 19  % Final  . Lymphs Abs 11/15/2015 1.0  0.7 - 4.0 K/uL Final  . Monocytes Relative 11/15/2015 8  % Final  . Monocytes Absolute 11/15/2015 0.4  0.1 - 1.0 K/uL Final  . Eosinophils Relative 11/15/2015 0  % Final  . Eosinophils Absolute 11/15/2015 0.0  0.0 - 0.7 K/uL Final  . Basophils Relative 11/15/2015 0  % Final  . Basophils Absolute 11/15/2015 0.0  0.0 - 0.1 K/uL Final  . Ammonia 11/15/2015 36* 9 - 35 umol/L Final  . Lactic Acid, Venous 11/15/2015 5.73* 0.5 - 1.9 mmol/L Final  . Comment  11/15/2015 NOTIFIED PHYSICIAN   Final  . Specimen Description 11/20/2015 BLOOD LEFT ANTECUBITAL   Final  . Special Requests 11/20/2015 BOTTLES DRAWN AEROBIC AND ANAEROBIC 5CC   Final  . Culture 11/20/2015 NO GROWTH 5 DAYS   Final  . Report Status 11/20/2015 11/20/2015 FINAL   Final  . Specimen Description 11/20/2015 BLOOD RIGHT FOREARM   Final  . Special Requests 11/20/2015 BOTTLES DRAWN AEROBIC AND ANAEROBIC 5CC   Final  . Culture 11/20/2015 NO GROWTH 5 DAYS   Final  . Report Status 11/20/2015 11/20/2015 FINAL   Final  . Prothrombin Time 11/15/2015 17.8* 11.4 - 15.2 seconds Final  . INR 11/15/2015 1.45   Final  . Lactic Acid, Venous 11/15/2015 4.71* 0.5 - 1.9 mmol/L Final  . Comment 11/15/2015 NOTIFIED PHYSICIAN   Final  . Cortisol, Plasma 11/15/2015 12.5  ug/dL Final   Comment: (NOTE) AM    6.7 - 22.6 ug/dL PM   <10.0       ug/dL   . Sodium, Ur 11/16/2015 77  mmol/L Final  . Creatinine, Urine 11/16/2015 89.94  mg/dL Final  . Squamous Epithelial / LPF 11/16/2015 0-5* NONE SEEN Final  . WBC, UA 11/16/2015 0-5  0 - 5 WBC/hpf Final  . RBC / HPF 11/16/2015 NONE SEEN  0 - 5 RBC/hpf Final  . Bacteria, UA 11/16/2015 RARE* NONE SEEN Final  . Casts  11/16/2015 HYALINE CASTS* NEGATIVE Final  . Lactic Acid, Venous 11/16/2015 3.8* 0.5 - 1.9 mmol/L Final   Comment: CRITICAL RESULT CALLED TO, READ BACK BY AND VERIFIED WITH: LEBRON Y,RN 11/16/15 0337 WAYK   . Lactic Acid, Venous 11/16/2015 3.2* 0.5 - 1.9 mmol/L Final   Comment: CRITICAL RESULT CALLED TO, READ BACK BY AND VERIFIED WITH: LEBRON Y,RN 11/16/15 0555 WAYK   . WBC 11/16/2015 5.0  4.0 - 10.5 K/uL Final  . RBC 11/16/2015 3.68* 4.22 - 5.81 MIL/uL Final  . Hemoglobin 11/16/2015 13.2  13.0 - 17.0 g/dL Final   Comment: DELTA CHECK NOTED REPEATED TO VERIFY   . HCT 11/16/2015 38.7* 39.0 - 52.0 % Final  . MCV 11/16/2015 105.2* 78.0 - 100.0 fL Final  . MCH 11/16/2015 35.1* 26.0 - 34.0 pg Final  . MCHC 11/16/2015 33.3  30.0 - 36.0 g/dL Final  . RDW 11/16/2015 14.1  11.5 - 15.5 % Final  . Platelets 11/16/2015 50* 150 - 400 K/uL Final   Comment: SPECIMEN CHECKED FOR CLOTS REPEATED TO VERIFY CONSISTENT WITH PREVIOUS RESULT   . Sodium 11/16/2015 132* 135 - 145 mmol/L Final  . Potassium 11/16/2015 4.3  3.5 - 5.1 mmol/L Final  . Chloride 11/16/2015 100* 101 - 111 mmol/L Final  . CO2 11/16/2015 22  22 - 32 mmol/L Final  . Glucose, Bld 11/16/2015 144* 65 - 99 mg/dL Final  . BUN 11/16/2015 25* 6 - 20 mg/dL Final  . Creatinine, Ser 11/16/2015 1.89* 0.61 - 1.24 mg/dL Final  . Calcium 11/16/2015 8.6* 8.9 - 10.3 mg/dL Final  . Total Protein 11/16/2015 5.3* 6.5 - 8.1 g/dL Final  . Albumin 11/16/2015 2.4* 3.5 - 5.0 g/dL Final  . AST 11/16/2015 43* 15 - 41 U/L Final  . ALT 11/16/2015 28  17 - 63 U/L Final  . Alkaline Phosphatase 11/16/2015 57  38 - 126 U/L Final  . Total Bilirubin 11/16/2015 2.9* 0.3 - 1.2 mg/dL Final  . GFR calc non Af Amer 11/16/2015 35* >60 mL/min Final  . GFR calc Af Amer 11/16/2015 41* >60 mL/min Final   Comment: (  NOTE) The eGFR has been calculated using the CKD EPI equation. This calculation has not been validated in all clinical situations. eGFR's persistently <60  mL/min signify possible Chronic Kidney Disease.   . Anion gap 11/16/2015 10  5 - 15 Final  . Osmolality 11/16/2015 283  275 - 295 mOsm/kg Final  . MRSA by PCR 11/16/2015 NEGATIVE  NEGATIVE Final   Comment:        The GeneXpert MRSA Assay (FDA approved for NASAL specimens only), is one component of a comprehensive MRSA colonization surveillance program. It is not intended to diagnose MRSA infection nor to guide or monitor treatment for MRSA infections.   . Glucose-Capillary 11/16/2015 114* 65 - 99 mg/dL Final  . Comment 1 11/16/2015 Notify RN   Final  . Comment 2 11/16/2015 Document in Chart   Final  . Glucose-Capillary 11/16/2015 194* 65 - 99 mg/dL Final  . Comment 1 11/16/2015 Notify RN   Final  . Sodium 11/17/2015 131* 135 - 145 mmol/L Final  . Potassium 11/17/2015 3.9  3.5 - 5.1 mmol/L Final  . Chloride 11/17/2015 100* 101 - 111 mmol/L Final  . CO2 11/17/2015 19* 22 - 32 mmol/L Final  . Glucose, Bld 11/17/2015 220* 65 - 99 mg/dL Final  . BUN 11/17/2015 25* 6 - 20 mg/dL Final  . Creatinine, Ser 11/17/2015 1.45* 0.61 - 1.24 mg/dL Final  . Calcium 11/17/2015 8.2* 8.9 - 10.3 mg/dL Final  . Total Protein 11/17/2015 5.2* 6.5 - 8.1 g/dL Final  . Albumin 11/17/2015 2.2* 3.5 - 5.0 g/dL Final  . AST 11/17/2015 43* 15 - 41 U/L Final  . ALT 11/17/2015 28  17 - 63 U/L Final  . Alkaline Phosphatase 11/17/2015 63  38 - 126 U/L Final  . Total Bilirubin 11/17/2015 1.9* 0.3 - 1.2 mg/dL Final  . GFR calc non Af Amer 11/17/2015 49* >60 mL/min Final  . GFR calc Af Amer 11/17/2015 56* >60 mL/min Final   Comment: (NOTE) The eGFR has been calculated using the CKD EPI equation. This calculation has not been validated in all clinical situations. eGFR's persistently <60 mL/min signify possible Chronic Kidney Disease.   . Anion gap 11/17/2015 12  5 - 15 Final  . WBC 11/17/2015 4.8  4.0 - 10.5 K/uL Final  . RBC 11/17/2015 3.81* 4.22 - 5.81 MIL/uL Final  . Hemoglobin 11/17/2015 13.7  13.0 - 17.0  g/dL Final  . HCT 11/17/2015 40.4  39.0 - 52.0 % Final  . MCV 11/17/2015 106.0* 78.0 - 100.0 fL Final  . MCH 11/17/2015 36.0* 26.0 - 34.0 pg Final  . MCHC 11/17/2015 33.9  30.0 - 36.0 g/dL Final  . RDW 11/17/2015 14.1  11.5 - 15.5 % Final  . Platelets 11/17/2015 42* 150 - 400 K/uL Final  . Glucose-Capillary 11/16/2015 165* 65 - 99 mg/dL Final  . Glucose-Capillary 11/17/2015 185* 65 - 99 mg/dL Final  . Comment 1 11/17/2015 Document in Chart   Final  . Glucose-Capillary 11/17/2015 263* 65 - 99 mg/dL Final  . Comment 1 11/17/2015 Document in Chart   Final  . Sodium 11/18/2015 133* 135 - 145 mmol/L Final  . Potassium 11/18/2015 4.1  3.5 - 5.1 mmol/L Final  . Chloride 11/18/2015 107  101 - 111 mmol/L Final  . CO2 11/18/2015 19* 22 - 32 mmol/L Final  . Glucose, Bld 11/18/2015 141* 65 - 99 mg/dL Final  . BUN 11/18/2015 21* 6 - 20 mg/dL Final  . Creatinine, Ser 11/18/2015 0.95  0.61 - 1.24  mg/dL Final  . Calcium 11/18/2015 7.9* 8.9 - 10.3 mg/dL Final  . GFR calc non Af Amer 11/18/2015 >60  >60 mL/min Final  . GFR calc Af Amer 11/18/2015 >60  >60 mL/min Final   Comment: (NOTE) The eGFR has been calculated using the CKD EPI equation. This calculation has not been validated in all clinical situations. eGFR's persistently <60 mL/min signify possible Chronic Kidney Disease.   . Anion gap 11/18/2015 7  5 - 15 Final  . WBC 11/18/2015 4.1  4.0 - 10.5 K/uL Final  . RBC 11/18/2015 3.78* 4.22 - 5.81 MIL/uL Final  . Hemoglobin 11/18/2015 13.2  13.0 - 17.0 g/dL Final  . HCT 11/18/2015 40.2  39.0 - 52.0 % Final  . MCV 11/18/2015 106.3* 78.0 - 100.0 fL Final  . MCH 11/18/2015 34.9* 26.0 - 34.0 pg Final  . MCHC 11/18/2015 32.8  30.0 - 36.0 g/dL Final  . RDW 11/18/2015 13.8  11.5 - 15.5 % Final  . Platelets 11/18/2015 36* 150 - 400 K/uL Final  . Glucose-Capillary 11/17/2015 307* 65 - 99 mg/dL Final  . Comment 1 11/17/2015 Notify RN   Final  . Glucose-Capillary 11/17/2015 219* 65 - 99 mg/dL Final  .  Glucose-Capillary 11/18/2015 159* 65 - 99 mg/dL Final  . Comment 1 11/18/2015 Notify RN   Final  . Glucose-Capillary 11/18/2015 225* 65 - 99 mg/dL Final  . Comment 1 11/18/2015 Notify RN   Final  . Glucose-Capillary 11/18/2015 189* 65 - 99 mg/dL Final  . Glucose-Capillary 11/18/2015 248* 65 - 99 mg/dL Final  . Glucose-Capillary 11/18/2015 179* 65 - 99 mg/dL Final  . Glucose-Capillary 11/19/2015 149* 65 - 99 mg/dL Final  . Vitamin B-12 11/19/2015 2960* 180 - 914 pg/mL Final   Comment: (NOTE) This assay is not validated for testing neonatal or myeloproliferative syndrome specimens for Vitamin B12 levels.   . Sodium 11/19/2015 131* 135 - 145 mmol/L Final  . Potassium 11/19/2015 4.5  3.5 - 5.1 mmol/L Final  . Chloride 11/19/2015 105  101 - 111 mmol/L Final  . CO2 11/19/2015 22  22 - 32 mmol/L Final  . Glucose, Bld 11/19/2015 171* 65 - 99 mg/dL Final  . BUN 11/19/2015 17  6 - 20 mg/dL Final  . Creatinine, Ser 11/19/2015 0.87  0.61 - 1.24 mg/dL Final  . Calcium 11/19/2015 7.9* 8.9 - 10.3 mg/dL Final  . Total Protein 11/19/2015 5.0* 6.5 - 8.1 g/dL Final  . Albumin 11/19/2015 2.2* 3.5 - 5.0 g/dL Final  . AST 11/19/2015 41  15 - 41 U/L Final  . ALT 11/19/2015 25  17 - 63 U/L Final  . Alkaline Phosphatase 11/19/2015 57  38 - 126 U/L Final  . Total Bilirubin 11/19/2015 1.8* 0.3 - 1.2 mg/dL Final  . GFR calc non Af Amer 11/19/2015 >60  >60 mL/min Final  . GFR calc Af Amer 11/19/2015 >60  >60 mL/min Final   Comment: (NOTE) The eGFR has been calculated using the CKD EPI equation. This calculation has not been validated in all clinical situations. eGFR's persistently <60 mL/min signify possible Chronic Kidney Disease.   . Anion gap 11/19/2015 4* 5 - 15 Final  . Folate 11/19/2015 19.5  >5.9 ng/mL Final  . Glucose-Capillary 11/19/2015 197* 65 - 99 mg/dL Final  Admission on 10/07/2015, Discharged on 10/09/2015  Component Date Value Ref Range Status  . aPTT 10/07/2015 34  24 - 37 seconds Final    . Sodium 10/07/2015 132* 135 - 145 mmol/L Final  .  Potassium 10/07/2015 4.7  3.5 - 5.1 mmol/L Final  . Chloride 10/07/2015 104  101 - 111 mmol/L Final  . CO2 10/07/2015 21* 22 - 32 mmol/L Final  . Glucose, Bld 10/07/2015 245* 65 - 99 mg/dL Final  . BUN 10/07/2015 22* 6 - 20 mg/dL Final  . Creatinine, Ser 10/07/2015 1.40* 0.61 - 1.24 mg/dL Final  . Calcium 10/07/2015 8.4* 8.9 - 10.3 mg/dL Final  . Total Protein 10/07/2015 6.0* 6.5 - 8.1 g/dL Final  . Albumin 10/07/2015 2.5* 3.5 - 5.0 g/dL Final  . AST 10/07/2015 33  15 - 41 U/L Final  . ALT 10/07/2015 30  17 - 63 U/L Final  . Alkaline Phosphatase 10/07/2015 73  38 - 126 U/L Final  . Total Bilirubin 10/07/2015 5.2* 0.3 - 1.2 mg/dL Final  . GFR calc non Af Amer 10/07/2015 51* >60 mL/min Final  . GFR calc Af Amer 10/07/2015 59* >60 mL/min Final   Comment: (NOTE) The eGFR has been calculated using the CKD EPI equation. This calculation has not been validated in all clinical situations. eGFR's persistently <60 mL/min signify possible Chronic Kidney Disease.   . Anion gap 10/07/2015 7  5 - 15 Final  . WBC 10/07/2015 6.3  4.0 - 10.5 K/uL Final  . RBC 10/07/2015 2.96* 4.22 - 5.81 MIL/uL Final  . Hemoglobin 10/07/2015 11.6* 13.0 - 17.0 g/dL Final  . HCT 10/07/2015 34.3* 39.0 - 52.0 % Final  . MCV 10/07/2015 115.9* 78.0 - 100.0 fL Final  . MCH 10/07/2015 39.2* 26.0 - 34.0 pg Final  . MCHC 10/07/2015 33.8  30.0 - 36.0 g/dL Final  . RDW 10/07/2015 16.0* 11.5 - 15.5 % Final  . Platelets 10/07/2015 75* 150 - 400 K/uL Final   Comment: SPECIMEN CHECKED FOR CLOTS REPEATED TO VERIFY PLATELET COUNT CONFIRMED BY SMEAR   . Neutrophils Relative % 10/07/2015 80  % Final  . Lymphocytes Relative 10/07/2015 11  % Final  . Monocytes Relative 10/07/2015 8  % Final  . Eosinophils Relative 10/07/2015 1  % Final  . Basophils Relative 10/07/2015 0  % Final  . Neutro Abs 10/07/2015 5.0  1.7 - 7.7 K/uL Final  . Lymphs Abs 10/07/2015 0.7  0.7 - 4.0 K/uL  Final  . Monocytes Absolute 10/07/2015 0.5  0.1 - 1.0 K/uL Final  . Eosinophils Absolute 10/07/2015 0.1  0.0 - 0.7 K/uL Final  . Basophils Absolute 10/07/2015 0.0  0.0 - 0.1 K/uL Final  . RBC Morphology 10/07/2015 POLYCHROMASIA PRESENT   Final  . Smear Review 10/07/2015 LARGE PLATELETS PRESENT   Final  . Prothrombin Time 10/07/2015 19.4* 11.6 - 15.2 seconds Final  . INR 10/07/2015 1.63* 0.00 - 1.49 Final  . Ammonia 10/07/2015 181* 9 - 35 umol/L Final  . Color, Urine 10/08/2015 RED* YELLOW Final  . APPearance 10/08/2015 CLOUDY* CLEAR Final  . Specific Gravity, Urine 10/08/2015 1.030  1.005 - 1.030 Final  . pH 10/08/2015 5.5  5.0 - 8.0 Final  . Glucose, UA 10/08/2015 100* NEGATIVE mg/dL Final  . Hgb urine dipstick 10/08/2015 LARGE* NEGATIVE Final  . Bilirubin Urine 10/08/2015 MODERATE* NEGATIVE Final  . Ketones, ur 10/08/2015 15* NEGATIVE mg/dL Final  . Protein, ur 10/08/2015 100* NEGATIVE mg/dL Final  . Nitrite 10/08/2015 POSITIVE* NEGATIVE Final  . Leukocytes, UA 10/08/2015 SMALL* NEGATIVE Final  . MRSA by PCR 10/07/2015 POSITIVE* NEGATIVE Final   Comment:        The GeneXpert MRSA Assay (FDA approved for NASAL specimens only), is one component  of a comprehensive MRSA colonization surveillance program. It is not intended to diagnose MRSA infection nor to guide or monitor treatment for MRSA infections. RESULT CALLED TO, READ BACK BY AND VERIFIED WITH: PAT MICKLEY,RN [id number] @ [id number] BY J SCOTTON   . Ammonia 10/08/2015 120* 9 - 35 umol/L Final  . Sodium 10/08/2015 134* 135 - 145 mmol/L Final  . Potassium 10/08/2015 4.3  3.5 - 5.1 mmol/L Final  . Chloride 10/08/2015 106  101 - 111 mmol/L Final  . CO2 10/08/2015 20* 22 - 32 mmol/L Final  . Glucose, Bld 10/08/2015 193* 65 - 99 mg/dL Final  . BUN 10/08/2015 22* 6 - 20 mg/dL Final  . Creatinine, Ser 10/08/2015 1.17  0.61 - 1.24 mg/dL Final  . Calcium 10/08/2015 8.3* 8.9 - 10.3 mg/dL Final  . Total Protein 10/08/2015 6.1* 6.5 - 8.1  g/dL Final  . Albumin 10/08/2015 2.8* 3.5 - 5.0 g/dL Final  . AST 10/08/2015 34  15 - 41 U/L Final  . ALT 10/08/2015 28  17 - 63 U/L Final  . Alkaline Phosphatase 10/08/2015 68  38 - 126 U/L Final  . Total Bilirubin 10/08/2015 3.8* 0.3 - 1.2 mg/dL Final  . GFR calc non Af Amer 10/08/2015 >60  >60 mL/min Final  . GFR calc Af Amer 10/08/2015 >60  >60 mL/min Final   Comment: (NOTE) The eGFR has been calculated using the CKD EPI equation. This calculation has not been validated in all clinical situations. eGFR's persistently <60 mL/min signify possible Chronic Kidney Disease.   . Anion gap 10/08/2015 8  5 - 15 Final  . WBC 10/08/2015 6.3  4.0 - 10.5 K/uL Final  . RBC 10/08/2015 2.95* 4.22 - 5.81 MIL/uL Final  . Hemoglobin 10/08/2015 11.6* 13.0 - 17.0 g/dL Final  . HCT 10/08/2015 34.5* 39.0 - 52.0 % Final  . MCV 10/08/2015 116.9* 78.0 - 100.0 fL Final  . MCH 10/08/2015 39.3* 26.0 - 34.0 pg Final  . MCHC 10/08/2015 33.6  30.0 - 36.0 g/dL Final  . RDW 10/08/2015 16.3* 11.5 - 15.5 % Final  . Platelets 10/08/2015 74* 150 - 400 K/uL Final  . Squamous Epithelial / LPF 10/08/2015 0-5* NONE SEEN Final  . WBC, UA 10/08/2015 0-5  0 - 5 WBC/hpf Final  . RBC / HPF 10/08/2015 TOO NUMEROUS TO COUNT  0 - 5 RBC/hpf Final  . Bacteria, UA 10/08/2015 MANY* NONE SEEN Final  . Casts 10/08/2015 HYALINE CASTS* NEGATIVE Final  . Urine-Other 10/08/2015 MUCOUS PRESENT   Final  . Glucose-Capillary 10/08/2015 202* 65 - 99 mg/dL Final  . Comment 1 10/08/2015 Notify RN   Final  . Comment 2 10/08/2015 Document in Chart   Final  . Glucose-Capillary 10/08/2015 186* 65 - 99 mg/dL Final  . Comment 1 10/08/2015 Notify RN   Final  . Comment 2 10/08/2015 Document in Chart   Final  . Sodium 10/09/2015 136  135 - 145 mmol/L Final  . Potassium 10/09/2015 4.8  3.5 - 5.1 mmol/L Final  . Chloride 10/09/2015 108  101 - 111 mmol/L Final  . CO2 10/09/2015 23  22 - 32 mmol/L Final  . Glucose, Bld 10/09/2015 317* 65 - 99 mg/dL  Final  . BUN 10/09/2015 27* 6 - 20 mg/dL Final  . Creatinine, Ser 10/09/2015 1.22  0.61 - 1.24 mg/dL Final  . Calcium 10/09/2015 8.4* 8.9 - 10.3 mg/dL Final  . Total Protein 10/09/2015 5.8* 6.5 - 8.1 g/dL Final  . Albumin 10/09/2015 2.6* 3.5 - 5.0  g/dL Final  . AST 10/09/2015 31  15 - 41 U/L Final  . ALT 10/09/2015 26  17 - 63 U/L Final  . Alkaline Phosphatase 10/09/2015 68  38 - 126 U/L Final  . Total Bilirubin 10/09/2015 3.6* 0.3 - 1.2 mg/dL Final  . GFR calc non Af Amer 10/09/2015 >60  >60 mL/min Final  . GFR calc Af Amer 10/09/2015 >60  >60 mL/min Final   Comment: (NOTE) The eGFR has been calculated using the CKD EPI equation. This calculation has not been validated in all clinical situations. eGFR's persistently <60 mL/min signify possible Chronic Kidney Disease.   . Anion gap 10/09/2015 5  5 - 15 Final  . WBC 10/09/2015 5.9  4.0 - 10.5 K/uL Final  . RBC 10/09/2015 2.90* 4.22 - 5.81 MIL/uL Final  . Hemoglobin 10/09/2015 11.3* 13.0 - 17.0 g/dL Final  . HCT 10/09/2015 34.5* 39.0 - 52.0 % Final  . MCV 10/09/2015 119.0* 78.0 - 100.0 fL Final  . MCH 10/09/2015 39.0* 26.0 - 34.0 pg Final  . MCHC 10/09/2015 32.8  30.0 - 36.0 g/dL Final  . RDW 10/09/2015 16.5* 11.5 - 15.5 % Final  . Platelets 10/09/2015 67* 150 - 400 K/uL Final  . Ammonia 10/09/2015 23  9 - 35 umol/L Final  . Glucose-Capillary 10/08/2015 259* 65 - 99 mg/dL Final  . Comment 1 10/08/2015 Notify RN   Final  . Comment 2 10/08/2015 Document in Chart   Final  . Glucose-Capillary 10/08/2015 264* 65 - 99 mg/dL Final  . Glucose-Capillary 10/09/2015 341* 65 - 99 mg/dL Final  . Comment 1 10/09/2015 Notify RN   Final  . Comment 2 10/09/2015 Document in Chart   Final  . Glucose-Capillary 10/09/2015 410* 65 - 99 mg/dL Final  . Comment 1 10/09/2015 Notify RN   Final  . Comment 2 10/09/2015 Document in Chart   Final  . Glucose-Capillary 10/09/2015 381* 65 - 99 mg/dL Final    No results found.   Assessment/Plan   ICD-9-CM  ICD-10-CM   1. Encephalopathy, hepatic (HCC) 572.2 K72.90   2. Cirrhosis of liver with ascites, unspecified hepatic cirrhosis type (HCC) 571.5 K74.60   3. [place] (hepatocellular carcinoma) (HCC) 155.0 C22.0   4. Chronic hepatitis C with cirrhosis (HCC) 070.54 B18.2    571.5 K74.60   5. Type II diabetes mellitus with peripheral circulatory disorder, uncontrolled (HCC) 250.72 E11.51    443.81 E11.65   6. Thrombocytopenia (HCC) 287.5 D69.6   7. Failure to thrive in adult 783.7 R62.7   8. Prostate enlargement 600.00 N40.0   9. Anemia of chronic disease 285.29 D63.8     CBC and CMP pending  Increase lactulose 30cc po BID with goal of 3-4 soft stools daily  Cont other meds as ordered  PT/OT/ST as ordered  Cont nutritional supplements as ordered  Therapeutic paracentesis as indicated  F/u with specialists (oncology, GI) as scheduled  GOAL: short term rehab and d/c home when medically appropriate. Communicated with pt and nursing.  Will follow  Delshawn Stech S. Perlie Gold  [facility] [address] [place], [place] [address] [phone] Cell (Monday-Friday 8 AM - 5 PM) [phone] After 5 PM and follow prompts

## 2016-01-07 NOTE — Telephone Encounter (Signed)
Prescription request was received from:  [facility]  [address] [place] [address]  PHONE: [phone]   Fax: [phone] 

## 2016-01-09 ENCOUNTER — Non-Acute Institutional Stay (SKILLED_NURSING_FACILITY): Payer: Medicare Other | Admitting: Adult Health

## 2016-01-09 ENCOUNTER — Encounter: Payer: Self-pay | Admitting: Adult Health

## 2016-01-09 DIAGNOSIS — K7460 Unspecified cirrhosis of liver: Secondary | ICD-10-CM | POA: Diagnosis not present

## 2016-01-09 DIAGNOSIS — B182 Chronic viral hepatitis C: Secondary | ICD-10-CM

## 2016-01-09 NOTE — Progress Notes (Signed)
Patient ID: Ethan Wade, male   DOB: 07/17/1950, 65 y.o.   MRN: [id number]   Location:   [place] Room Number: 146-A Place of Service:  SNF (31)   CODE STATUS: Full Code  No Known Allergies  Chief Complaint  Patient presents with  . Acute Visit    Ammonia Level    HPI:  His ammonia level is elevated; at 254; he is on chronulac 30 cc twice daily and has had only one bm; but since that he is somewhat more alert.   Past Medical History:  Diagnosis Date  . AKI (acute kidney injury) ([place]) 11/15/2015  . Arthritis    "joint stiffness in the knees" (11/10/2013)  . Ascites   . Esophageal bleed, non-variceal 2006  . Heart murmur   . Hepatic encephalopathy ([place]) 05/2015  . Hepatitis C 1980's  . HTN (hypertension)   . Liver cancer ([place])   . Macrocytosis   . Mitral valve prolapse    OCCAS FLUTTER FEELING  . Prostate enlargement    PT TOLD "NORMAL FOR AGE" - TAKES FINASTERIDE AND FLOMAX  . Psoriasis   . Rheumatic fever 1950's  . Thrombocytopenia ([place])   . Type II diabetes mellitus ([place])   . Umbilical hernia    OCCAS DISCOMFORT  . Vitamin D deficiency     Past Surgical History:  Procedure Laterality Date  . HERNIA REPAIR    . INSERTION OF MESH  03/09/2012   Procedure: INSERTION OF MESH;  Surgeon: Edward Jolly, MD;  Location: [facility] ORS;  Service: General;  Laterality: N/A;  . TONSILECTOMY, ADENOIDECTOMY, BILATERAL MYRINGOTOMY AND TUBES  child  . TONSILLECTOMY    . UMBILICAL HERNIA REPAIR  03/09/2012   Procedure: HERNIA REPAIR UMBILICAL ADULT;  Surgeon: Edward Jolly, MD;  Location: [facility];  Service: General;  Laterality: N/A;  Repair Umbilical Hernia with Mesh    Social History   Social History  . Marital status: Single    Spouse name: N/A  . Number of children: N/A  . Years of education: N/A   Occupational History  . Not on file.   Social History Main Topics  . Smoking status: Former Smoker    Years: 20.00    Types: Cigars    Start  date: 12/22/2005  . Smokeless tobacco: Former Systems developer    Types: Snuff     Comment: "quit smoking ~ 2009; quit snuff in ~ 2013"  . Alcohol use Yes     Comment: "quit in late April 2015"  . Drug use:      Comment: "tried IV drugs a couple times in the 1960's or 1970's; nothing since"  . Sexual activity: No   Other Topics Concern  . Not on file   Social History Narrative  . No narrative on file   Family History  Problem Relation Age of Onset  . COPD Mother   . Heart disease Father   . Diabetes Father   . Alcohol abuse Father   . Prostate cancer Brother   . Liver cancer Brother   . Colon cancer Brother   . COPD Maternal Grandfather       VITAL SIGNS BP 138/78   Pulse 74   Temp 97.8 F (36.6 C) (Oral)   Resp 18   Ht 5\' 10"  (1.778 m)   Wt 146 lb (66.2 kg)   SpO2 96%   BMI 20.95 kg/m   Patient's Medications  New Prescriptions   No medications on file  Previous Medications   CARBOXYMETHYLCELLUL-GLYCERIN (CLEAR EYES FOR DRY EYES) 1-0.25 % SOLN    Apply 1-2 drops to eye daily as needed (for dry eyes). Reported on 05/20/2015   DRONABINOL (MARINOL) 2.5 MG CAPSULE    Take 1 capsule (2.5 mg total) by mouth 2 (two) times daily before a meal.   FUROSEMIDE (LASIX) 20 MG TABLET    Take 40 mg by mouth 2 (two) times daily.   IBUPROFEN (ADVIL,MOTRIN) 600 MG TABLET    Take 600 mg by mouth every 6 (six) hours as needed.   INSULIN GLARGINE (LANTUS) 100 UNIT/ML INJECTION    Inject 16 Units into the skin at bedtime.   INSULIN LISPRO (HUMALOG) 100 UNIT/ML INJECTION    Inject into the skin. Inject as per sliding scale: if  70-150= 0  ; 151-200 = 0 units, 201-250= 3 units; 251-300= 5 units; 301-350= 7 units.  IF CBG  < 70 treat per protocol. If >35 notify MD, subcutaneously before meals and at bedtime related to DM.   LACTULOSE (CHRONULAC) 10 GM/15ML SOLUTION    Take 30 g by mouth 2 (two) times daily.    NADOLOL (CORGARD) 20 MG TABLET    Take 1 tablet (20 mg total) by mouth daily.   OXYCODONE  HCL, ABUSE DETER, (OXAYDO) 5 MG TABA    Take 1 tablet by mouth every 6 (six) hours as needed.   SPIRONOLACTONE (ALDACTONE) 50 MG TABLET    Take 50 mg by mouth 2 (two) times daily.   TAMSULOSIN (FLOMAX) 0.4 MG CAPS CAPSULE    Take 0.4 mg by mouth daily.   VITAMIN D, ERGOCALCIFEROL, (DRISDOL) 50000 UNITS CAPS CAPSULE    Take 50,000 Units by mouth every 7 (seven) days.   XIFAXAN 550 MG TABS TABLET    Take 550 mg by mouth 2 (two) times daily.   ZOLPIDEM (AMBIEN) 5 MG TABLET    Take 1 tablet (5 mg total) by mouth at bedtime.  Modified Medications   No medications on file  Discontinued Medications   No medications on file     SIGNIFICANT DIAGNOSTIC EXAMS  10-21-15: ct of chest abdomen and pelvis: 1. No suspicious pulmonary nodules or masses present. Sub-centimeter thyroid nodules again seen. Stable prominence of ascending thoracic aorta 4.1 cm. Pulmonary artery appear normal. No mediastinal or hilar lymphadenopathy. Osseous structures appear unremarkable.   2. Several new LI-RADS 4 lesions throughout liver 3. New enhancement at previous LI-RADS TR site in segment 7 is now viable 4. Stable LI RADS TR viable lesion segment 6  5. 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  catechetic   Eyes: Conjunctivae are normal.  Neck: Neck supple. No JVD present. No thyromegaly present.  Cardiovascular: Normal rate and  regular rhythm.   Post tibs and pedal pulses unable to palpate   Respiratory: Effort normal and breath sounds normal. No  respiratory distress. He has no wheezes.  GI: Soft. Bowel sounds are normal.  Significant ascites present 01-02-16: status post paracentesis of 6 liters   Musculoskeletal: He exhibits no edema.  Able to move all extremities   Lymphadenopathy:    He has no cervical adenopathy.  Neurological:  He is aware of his surroundings   Skin: Skin is warm and dry. He is not diaphoretic.  Skin frail Lower extremities discolored       ASSESSMENT/ PLAN:  1. HCC; HCV; Cirrhosis:  Will increase chronulac to 30 cc tid will continue  rifaximin 550 mg twice daily  is followed by oncology is status post paracentesis 6 liter on 01-02-16; has oxycodone 5 mg every 6 hours as needed for pain     MD is aware of resident's narcotic use and is in agreement with current plan of care. We will attempt to wean resident as appropriate.     Ok Edwards NP [facility]  Contact [phone] Monday through Friday 8am- 5pm  After hours call [phone]

## 2016-01-11 ENCOUNTER — Inpatient Hospital Stay (HOSPITAL_COMMUNITY)
Admission: EM | Admit: 2016-01-11 | Discharge: 2016-01-29 | DRG: 442 | Disposition: E | Payer: Medicare Other | Attending: Pulmonary Disease | Admitting: Pulmonary Disease

## 2016-01-11 ENCOUNTER — Emergency Department (HOSPITAL_COMMUNITY): Payer: Medicare Other

## 2016-01-11 ENCOUNTER — Inpatient Hospital Stay (HOSPITAL_COMMUNITY): Payer: Medicare Other

## 2016-01-11 ENCOUNTER — Encounter (HOSPITAL_COMMUNITY): Payer: Self-pay | Admitting: Emergency Medicine

## 2016-01-11 DIAGNOSIS — Z833 Family history of diabetes mellitus: Secondary | ICD-10-CM

## 2016-01-11 DIAGNOSIS — D696 Thrombocytopenia, unspecified: Secondary | ICD-10-CM | POA: Diagnosis present

## 2016-01-11 DIAGNOSIS — B182 Chronic viral hepatitis C: Secondary | ICD-10-CM | POA: Diagnosis present

## 2016-01-11 DIAGNOSIS — Z8249 Family history of ischemic heart disease and other diseases of the circulatory system: Secondary | ICD-10-CM

## 2016-01-11 DIAGNOSIS — K766 Portal hypertension: Secondary | ICD-10-CM | POA: Diagnosis present

## 2016-01-11 DIAGNOSIS — Z825 Family history of asthma and other chronic lower respiratory diseases: Secondary | ICD-10-CM

## 2016-01-11 DIAGNOSIS — L89151 Pressure ulcer of sacral region, stage 1: Secondary | ICD-10-CM | POA: Diagnosis present

## 2016-01-11 DIAGNOSIS — K7290 Hepatic failure, unspecified without coma: Secondary | ICD-10-CM | POA: Diagnosis present

## 2016-01-11 DIAGNOSIS — E878 Other disorders of electrolyte and fluid balance, not elsewhere classified: Secondary | ICD-10-CM | POA: Diagnosis present

## 2016-01-11 DIAGNOSIS — Z794 Long term (current) use of insulin: Secondary | ICD-10-CM | POA: Diagnosis not present

## 2016-01-11 DIAGNOSIS — K7201 Acute and subacute hepatic failure with coma: Secondary | ICD-10-CM | POA: Diagnosis not present

## 2016-01-11 DIAGNOSIS — E871 Hypo-osmolality and hyponatremia: Secondary | ICD-10-CM | POA: Diagnosis present

## 2016-01-11 DIAGNOSIS — I10 Essential (primary) hypertension: Secondary | ICD-10-CM | POA: Diagnosis present

## 2016-01-11 DIAGNOSIS — R092 Respiratory arrest: Secondary | ICD-10-CM | POA: Diagnosis not present

## 2016-01-11 DIAGNOSIS — Z66 Do not resuscitate: Secondary | ICD-10-CM | POA: Diagnosis present

## 2016-01-11 DIAGNOSIS — E559 Vitamin D deficiency, unspecified: Secondary | ICD-10-CM | POA: Diagnosis present

## 2016-01-11 DIAGNOSIS — Z8042 Family history of malignant neoplasm of prostate: Secondary | ICD-10-CM | POA: Diagnosis not present

## 2016-01-11 DIAGNOSIS — Z87891 Personal history of nicotine dependence: Secondary | ICD-10-CM

## 2016-01-11 DIAGNOSIS — Z515 Encounter for palliative care: Secondary | ICD-10-CM | POA: Diagnosis not present

## 2016-01-11 DIAGNOSIS — E1165 Type 2 diabetes mellitus with hyperglycemia: Secondary | ICD-10-CM | POA: Diagnosis present

## 2016-01-11 DIAGNOSIS — Z800 Family history of malignant neoplasm of digestive organs: Secondary | ICD-10-CM

## 2016-01-11 DIAGNOSIS — R4182 Altered mental status, unspecified: Secondary | ICD-10-CM | POA: Diagnosis present

## 2016-01-11 DIAGNOSIS — Z811 Family history of alcohol abuse and dependence: Secondary | ICD-10-CM | POA: Diagnosis not present

## 2016-01-11 DIAGNOSIS — L409 Psoriasis, unspecified: Secondary | ICD-10-CM | POA: Diagnosis present

## 2016-01-11 DIAGNOSIS — K7460 Unspecified cirrhosis of liver: Secondary | ICD-10-CM | POA: Diagnosis present

## 2016-01-11 DIAGNOSIS — Z9221 Personal history of antineoplastic chemotherapy: Secondary | ICD-10-CM | POA: Diagnosis not present

## 2016-01-11 DIAGNOSIS — E7220 Disorder of urea cycle metabolism, unspecified: Secondary | ICD-10-CM | POA: Diagnosis not present

## 2016-01-11 DIAGNOSIS — M1990 Unspecified osteoarthritis, unspecified site: Secondary | ICD-10-CM | POA: Diagnosis present

## 2016-01-11 DIAGNOSIS — C220 Liver cell carcinoma: Secondary | ICD-10-CM | POA: Diagnosis present

## 2016-01-11 DIAGNOSIS — D649 Anemia, unspecified: Secondary | ICD-10-CM | POA: Diagnosis present

## 2016-01-11 DIAGNOSIS — L8990 Pressure ulcer of unspecified site, unspecified stage: Secondary | ICD-10-CM | POA: Insufficient documentation

## 2016-01-11 LAB — CBC WITH DIFFERENTIAL/PLATELET
BASOS ABS: 0 10*3/uL (ref 0.0–0.1)
Basophils Relative: 1 %
EOS PCT: 3 %
Eosinophils Absolute: 0.2 10*3/uL (ref 0.0–0.7)
HCT: 37.8 % — ABNORMAL LOW (ref 39.0–52.0)
HEMOGLOBIN: 13.1 g/dL (ref 13.0–17.0)
LYMPHS PCT: 16 %
Lymphs Abs: 0.9 10*3/uL (ref 0.7–4.0)
MCH: 35.3 pg — ABNORMAL HIGH (ref 26.0–34.0)
MCHC: 34.7 g/dL (ref 30.0–36.0)
MCV: 101.9 fL — AB (ref 78.0–100.0)
Monocytes Absolute: 0.7 10*3/uL (ref 0.1–1.0)
Monocytes Relative: 12 %
NEUTROS ABS: 3.7 10*3/uL (ref 1.7–7.7)
NEUTROS PCT: 68 %
PLATELETS: 71 10*3/uL — AB (ref 150–400)
RBC: 3.71 MIL/uL — AB (ref 4.22–5.81)
RDW: 15.5 % (ref 11.5–15.5)
WBC: 5.4 10*3/uL (ref 4.0–10.5)

## 2016-01-11 LAB — I-STAT ARTERIAL BLOOD GAS, ED
Acid-base deficit: 1 mmol/L (ref 0.0–2.0)
BICARBONATE: 22.8 mmol/L (ref 20.0–28.0)
O2 Saturation: 100 %
PO2 ART: 230 mmHg — AB (ref 83.0–108.0)
TCO2: 24 mmol/L (ref 0–100)
pCO2 arterial: 33.5 mmHg (ref 32.0–48.0)
pH, Arterial: 7.441 (ref 7.350–7.450)

## 2016-01-11 LAB — COMPREHENSIVE METABOLIC PANEL
ALT: 28 U/L (ref 17–63)
ANION GAP: 9 (ref 5–15)
AST: 35 U/L (ref 15–41)
Albumin: 2.4 g/dL — ABNORMAL LOW (ref 3.5–5.0)
Alkaline Phosphatase: 86 U/L (ref 38–126)
BILIRUBIN TOTAL: 1.9 mg/dL — AB (ref 0.3–1.2)
BUN: 28 mg/dL — AB (ref 6–20)
CO2: 22 mmol/L (ref 22–32)
Calcium: 8.8 mg/dL — ABNORMAL LOW (ref 8.9–10.3)
Chloride: 100 mmol/L — ABNORMAL LOW (ref 101–111)
Creatinine, Ser: 1.49 mg/dL — ABNORMAL HIGH (ref 0.61–1.24)
GFR calc Af Amer: 55 mL/min — ABNORMAL LOW (ref >60)
GFR, EST NON AFRICAN AMERICAN: 47 mL/min — AB (ref >60)
Glucose, Bld: 321 mg/dL — ABNORMAL HIGH (ref 65–99)
POTASSIUM: 4.8 mmol/L (ref 3.5–5.1)
Sodium: 131 mmol/L — ABNORMAL LOW (ref 135–145)
TOTAL PROTEIN: 5.8 g/dL — AB (ref 6.5–8.1)

## 2016-01-11 LAB — I-STAT CHEM 8, ED
BUN: 31 mg/dL — AB (ref 6–20)
CHLORIDE: 97 mmol/L — AB (ref 101–111)
Calcium, Ion: 1.1 mmol/L — ABNORMAL LOW (ref 1.15–1.40)
Creatinine, Ser: 1.3 mg/dL — ABNORMAL HIGH (ref 0.61–1.24)
GLUCOSE: 307 mg/dL — AB (ref 65–99)
HCT: 37 % — ABNORMAL LOW (ref 39.0–52.0)
Hemoglobin: 12.6 g/dL — ABNORMAL LOW (ref 13.0–17.0)
POTASSIUM: 4.8 mmol/L (ref 3.5–5.1)
Sodium: 133 mmol/L — ABNORMAL LOW (ref 135–145)
TCO2: 24 mmol/L (ref 0–100)

## 2016-01-11 LAB — AMMONIA: Ammonia: 328 umol/L — ABNORMAL HIGH (ref 9–35)

## 2016-01-11 LAB — LIPASE, BLOOD: LIPASE: 28 U/L (ref 11–51)

## 2016-01-11 LAB — MRSA PCR SCREENING: MRSA by PCR: POSITIVE — AB

## 2016-01-11 MED ORDER — ATROPINE SULFATE 1 % OP SOLN
2.0000 [drp] | Freq: Four times a day (QID) | OPHTHALMIC | Status: DC | PRN
  Administered 2016-01-13 (×2): 2 [drp] via SUBLINGUAL
  Filled 2016-01-11: qty 2

## 2016-01-11 MED ORDER — SODIUM CHLORIDE 0.9 % IV BOLUS (SEPSIS)
1000.0000 mL | Freq: Once | INTRAVENOUS | Status: AC
  Administered 2016-01-11: 1000 mL via INTRAVENOUS

## 2016-01-11 MED ORDER — MIDAZOLAM HCL 2 MG/2ML IJ SOLN: 1.0000 mg | INTRAMUSCULAR | Status: DC | PRN

## 2016-01-11 MED ORDER — NALOXONE HCL 2 MG/2ML IJ SOSY
2.0000 mg | PREFILLED_SYRINGE | Freq: Once | INTRAMUSCULAR | Status: AC
  Administered 2016-01-11: 2 mg via INTRAVENOUS
  Filled 2016-01-11: qty 2

## 2016-01-11 MED ORDER — LACTULOSE 10 GM/15ML PO SOLN
30.0000 g | Freq: Once | ORAL | Status: AC
  Administered 2016-01-11: 30 g
  Filled 2016-01-11: qty 45

## 2016-01-11 MED ORDER — ETOMIDATE 2 MG/ML IV SOLN
20.0000 mg | Freq: Once | INTRAVENOUS | Status: AC
  Administered 2016-01-11: 20 mg via INTRAVENOUS

## 2016-01-11 MED ORDER — FENTANYL BOLUS VIA INFUSION
25.0000 ug | INTRAVENOUS | Status: DC | PRN
  Filled 2016-01-11: qty 25

## 2016-01-11 MED ORDER — SODIUM CHLORIDE 0.9 % IV SOLN
250.0000 mL | INTRAVENOUS | Status: DC | PRN
  Administered 2016-01-13: 250 mL via INTRAVENOUS

## 2016-01-11 MED ORDER — NALOXONE HCL 2 MG/2ML IJ SOSY
PREFILLED_SYRINGE | INTRAMUSCULAR | Status: AC
  Filled 2016-01-11: qty 2

## 2016-01-11 MED ORDER — FENTANYL BOLUS VIA INFUSION
50.0000 ug | INTRAVENOUS | Status: DC | PRN
  Administered 2016-01-13 (×2): 50 ug via INTRAVENOUS
  Filled 2016-01-11: qty 200

## 2016-01-11 MED ORDER — ROCURONIUM BROMIDE 50 MG/5ML IV SOLN
100.0000 mg | Freq: Once | INTRAVENOUS | Status: AC
  Administered 2016-01-11: 100 mg via INTRAVENOUS

## 2016-01-11 MED ORDER — FENTANYL CITRATE (PF) 100 MCG/2ML IJ SOLN
50.0000 ug | Freq: Once | INTRAMUSCULAR | Status: DC
  Filled 2016-01-11: qty 2

## 2016-01-11 MED ORDER — FENTANYL 2500MCG IN NS 250ML (10MCG/ML) PREMIX INFUSION
100.0000 ug/h | INTRAVENOUS | Status: DC
  Administered 2016-01-12 – 2016-01-13 (×2): 100 ug/h via INTRAVENOUS
  Filled 2016-01-11 (×2): qty 250

## 2016-01-11 MED ORDER — FENTANYL 2500MCG IN NS 250ML (10MCG/ML) PREMIX INFUSION
25.0000 ug/h | INTRAVENOUS | Status: DC
  Administered 2016-01-11: 50 ug/h via INTRAVENOUS
  Filled 2016-01-11: qty 250

## 2016-01-11 NOTE — Progress Notes (Signed)
Pt niece at bedside anxious, tearful, expressing concerns for pt having episodes of vomiting today. Niece verbalizing pt is has a low tolerance for pain meds, commenting current rate of pain med is "too much". Niece explains that episodes of vomiting started after pain med was increased from the "50 rate". Pt currently unresponsive to sternal rub rr even unlabored vss. fentanyl drip decreased from 150 mcg to 75 mcg/hr, will continue to monitor for signs of pain/discomfort, niece in agreement.

## 2016-01-11 NOTE — Plan of Care (Addendum)
  Interdisciplinary Goals of Care Family Meeting   Date carried out:: 01/21/2016  Location of the meeting: Bedside  Member's involved: Physician, Bedside Registered Nurse and Family Member or next of kin  Durable Power of Attorney or acting medical decision maker: Tuff Maturo (brother)    Discussion: We discussed goals of care for [facility].  Juanda Crumble told us that his brother has a declining course over the past year. He was unable to complete his last course of chemotherapy due to hyperglycemia. Avigdor as made it clear recently that he would not want to continue aggressive care if he does not have quality of life and has discussed hospice earlier this week.   We have decided to make him DNR. Juanda Crumble will call the rest of the family to update them. He plans on withdrawing care to comfort later today.  Code status: Limited Code or DNR with short term  Disposition: In-patient comfort care when rest of family arrives. Hospice if the survives the extubation.   Time spent for the meeting: 10 mins.  Jaylenne Hamelin 12/29/2015, 5:59 AM

## 2016-01-11 NOTE — ED Provider Notes (Addendum)
[facility] DEPT Provider Note   CSN: [id number] Arrival date & time: 01/07/2016  0231  By signing my name below, I, Ethan Wade, attest that this documentation has been prepared under the direction and in the presence of Yoselin Amerman, MD. Electronically Signed: Reola Wade, ED Scribe. 01/07/2016. 3:10 AM.  History   Chief Complaint Chief Complaint  Patient presents with  . Altered Mental Status   LEVEL 5 CAVEAT: HPI and ROS limited due to AMS  The history is provided by the nursing home and the EMS personnel. The history is limited by the condition of the patient (AMS). No language interpreter was used.  Altered Mental Status   This is a new problem. The current episode started 6 to 12 hours ago. The problem has not changed since onset.Associated symptoms include somnolence. Risk factors: liver disease and narcotic pain medication. His past medical history does not include head trauma.    HPI Comments: Ethan Wade is a 65 y.o. male BIB EMS from [facility] [employer] [facility], with a PMHx of liver cancer, who presents to the Emergency Department with altered mental status onset ~8 hours ago. Pt is currently in a nursing facility who noticed the he was acting away from baseline and was unable to answer their questions. No known precipitating factors.   Past Medical History:  Diagnosis Date  . AKI (acute kidney injury) ([place]) 11/15/2015  . Arthritis    "joint stiffness in the knees" (11/10/2013)  . Ascites   . Esophageal bleed, non-variceal 2006  . Heart murmur   . Hepatic encephalopathy ([place]) 05/2015  . Hepatitis C 1980's  . HTN (hypertension)   . Liver cancer ([place])   . Macrocytosis   . Mitral valve prolapse    OCCAS FLUTTER FEELING  . Prostate enlargement    PT TOLD "NORMAL FOR AGE" - TAKES FINASTERIDE AND FLOMAX  . Psoriasis   . Rheumatic fever 1950's  . Thrombocytopenia ([place])   . Type II diabetes mellitus ([place])   . Umbilical hernia    OCCAS DISCOMFORT  . Vitamin D  deficiency    Patient Active Problem List   Diagnosis Date Noted  . Type II diabetes mellitus with peripheral circulatory disorder, uncontrolled ([place]) 01/06/2016  . Failure to thrive in adult 01/06/2016  . Acute renal failure (ARF) ([place])   . Prolonged QT interval 11/16/2015  . Sepsis ([place]) 11/15/2015  . AKI (acute kidney injury) ([place]) 11/15/2015  . Protein-calorie malnutrition, severe 10/09/2015  . Encounter for palliative care   . Goals of care, counseling/discussion   . Hypotension 10/07/2015  . Hyperkalemia 09/27/2015  . Mild dehydration   . [place] (hepatocellular carcinoma) ([place])   . Pressure ulcer 02/24/2015  . Increased ammonia level   . Liver cancer ([place]) 04/01/2014  . Elevated troponin 04/01/2014  . Cirrhosis ([place])   . Hyponatremia 11/10/2013  . PVT (portal vein thrombosis) 11/10/2013  . HCV (hepatitis C virus) 10/31/2013  . Thrombocytopenia ([place])   . Chronic hepatitis C with cirrhosis ([place])   . Esophageal bleed, non-variceal   . HTN (hypertension)   . Psoriasis   . Mitral valve prolapse   . Arthritis   . Prostate enlargement    Past Surgical History:  Procedure Laterality Date  . HERNIA REPAIR    . INSERTION OF MESH  03/09/2012   Procedure: INSERTION OF MESH;  Surgeon: Edward Jolly, MD;  Location: [facility] ORS;  Service: General;  Laterality: N/A;  . TONSILECTOMY, ADENOIDECTOMY, BILATERAL MYRINGOTOMY AND TUBES  child  .  TONSILLECTOMY    . UMBILICAL HERNIA REPAIR  03/09/2012   Procedure: HERNIA REPAIR UMBILICAL ADULT;  Surgeon: Edward Jolly, MD;  Location: [facility];  Service: General;  Laterality: N/A;  Repair Umbilical Hernia with Mesh    Home Medications    Prior to Admission medications   Medication Sig Start Date End Date Taking? Authorizing Provider  Carboxymethylcellul-Glycerin (CLEAR EYES FOR DRY EYES) 1-0.25 % SOLN Apply 1-2 drops to eye daily as needed (for dry eyes). Reported on 05/20/2015    Historical Provider, MD  dronabinol (MARINOL) 2.5 MG  capsule Take 1 capsule (2.5 mg total) by mouth 2 (two) times daily before a meal. 01/07/16   Lauree Chandler, NP  furosemide (LASIX) 20 MG tablet Take 40 mg by mouth 2 (two) times daily.    Historical Provider, MD  ibuprofen (ADVIL,MOTRIN) 600 MG tablet Take 600 mg by mouth every 6 (six) hours as needed.    Historical Provider, MD  insulin glargine (LANTUS) 100 UNIT/ML injection Inject 16 Units into the skin at bedtime.    Historical Provider, MD  insulin lispro (HUMALOG) 100 UNIT/ML injection Inject into the skin. Inject as per sliding scale: if  70-150= 0  ; 151-200 = 0 units, 201-250= 3 units; 251-300= 5 units; 301-350= 7 units.  IF CBG  < 70 treat per protocol. If >35 notify MD, subcutaneously before meals and at bedtime related to DM.    Historical Provider, MD  lactulose (CHRONULAC) 10 GM/15ML solution Take 30 g by mouth 2 (two) times daily.     Historical Provider, MD  nadolol (CORGARD) 20 MG tablet Take 1 tablet (20 mg total) by mouth daily. 11/19/15   Allie Bossier, MD  OxyCODONE HCl, Abuse Deter, (OXAYDO) 5 MG TABA Take 1 tablet by mouth every 6 (six) hours as needed. 01/03/16 February 08, 2016  Historical Provider, MD  spironolactone (ALDACTONE) 50 MG tablet Take 50 mg by mouth 2 (two) times daily.    Historical Provider, MD  tamsulosin (FLOMAX) 0.4 MG CAPS capsule Take 0.4 mg by mouth daily.    Historical Provider, MD  Vitamin D, Ergocalciferol, (DRISDOL) 50000 units CAPS capsule Take 50,000 Units by mouth every 7 (seven) days.    Historical Provider, MD  XIFAXAN 550 MG TABS tablet Take 550 mg by mouth 2 (two) times daily. 05/17/15   Historical Provider, MD  zolpidem (AMBIEN) 5 MG tablet Take 1 tablet (5 mg total) by mouth at bedtime. 01/07/16   Lauree Chandler, NP   Family History Family History  Problem Relation Age of Onset  . COPD Mother   . Heart disease Father   . Diabetes Father   . Alcohol abuse Father   . Prostate cancer Brother   . Liver cancer Brother   . Colon cancer Brother     . COPD Maternal Grandfather    Social History Social History  Substance Use Topics  . Smoking status: Former Smoker    Years: 20.00    Types: Cigars    Start date: 12/22/2005  . Smokeless tobacco: Former Systems developer    Types: Snuff     Comment: "quit smoking ~ 2009; quit snuff in ~ 2013"  . Alcohol use Yes     Comment: "quit in late Dura Mccormack 2015"   Allergies   Review of patient's allergies indicates no known allergies.   Review of Systems Review of Systems  Unable to perform ROS: Mental status change  Constitutional: Negative for fever.  Neurological: Negative for syncope.  Physical Exam Updated Vital Signs BP (!) 84/68   Pulse 64   Resp 15   SpO2 99%   Physical Exam  Constitutional: He appears well-developed.  HENT:  Head: Normocephalic and atraumatic.  Right Ear: External ear normal.  Left Ear: External ear normal.  Mouth/Throat: No oropharyngeal exudate.  Eyes: Conjunctivae and EOM are normal. Pupils are equal, round, and reactive to light. Right eye exhibits no discharge. Left eye exhibits no discharge. No scleral icterus.  4 to 3 mm pupils sluggish  Neck: Normal range of motion. Neck supple. No JVD present. No tracheal deviation present.  Trachea is midline. No stridor or carotid bruits.  Cardiovascular: Normal rate, regular rhythm, normal heart sounds and intact distal pulses.   No murmur heard. Pulmonary/Chest: Effort normal and breath sounds normal. No stridor. No respiratory distress. He has no wheezes. He has no rales.  Diminished  breath sounds bilaterally.   Abdominal: Soft. Bowel sounds are normal. He exhibits distension, fluid wave and ascites. There is no tenderness. There is no rebound and no guarding.  Caput medusae noted.  Musculoskeletal: Normal range of motion. He exhibits no edema or tenderness.  All compartments are soft. No palpable cords. Intact radial pulses. Multiple bruising across the bilateral LE at different stages of healing.    Lymphadenopathy:    He has no cervical adenopathy.  Neurological: He has normal reflexes. He displays normal reflexes. GCS eye subscore is 4. GCS verbal subscore is 1. GCS motor subscore is 4.  Mild liver flap bilaterally.   Skin: Skin is warm and dry. Capillary refill takes less than 2 seconds. He is not diaphoretic.  Stage one sacral decubitus.   Psychiatric: He has a normal mood and affect. His behavior is normal.  Nursing note and vitals reviewed.  ED Treatments / Results   Vitals:   01/03/2016 0400 01/07/2016 0430  BP: 130/91 115/84  Pulse: 63 67  Resp: 16 16  Temp:     Results for orders placed or performed during the hospital encounter of 01/02/2016  CBC with Differential/Platelet  Result Value Ref Range   WBC 5.4 4.0 - 10.5 K/uL   RBC 3.71 (L) 4.22 - 5.81 MIL/uL   Hemoglobin 13.1 13.0 - 17.0 g/dL   HCT 37.8 (L) 39.0 - 52.0 %   MCV 101.9 (H) 78.0 - 100.0 fL   MCH 35.3 (H) 26.0 - 34.0 pg   MCHC 34.7 30.0 - 36.0 g/dL   RDW 15.5 11.5 - 15.5 %   Platelets 71 (L) 150 - 400 K/uL   Neutrophils Relative % 68 %   Neutro Abs 3.7 1.7 - 7.7 K/uL   Lymphocytes Relative 16 %   Lymphs Abs 0.9 0.7 - 4.0 K/uL   Monocytes Relative 12 %   Monocytes Absolute 0.7 0.1 - 1.0 K/uL   Eosinophils Relative 3 %   Eosinophils Absolute 0.2 0.0 - 0.7 K/uL   Basophils Relative 1 %   Basophils Absolute 0.0 0.0 - 0.1 K/uL  Comprehensive metabolic panel  Result Value Ref Range   Sodium 131 (L) 135 - 145 mmol/L   Potassium 4.8 3.5 - 5.1 mmol/L   Chloride 100 (L) 101 - 111 mmol/L   CO2 22 22 - 32 mmol/L   Glucose, Bld 321 (H) 65 - 99 mg/dL   BUN 28 (H) 6 - 20 mg/dL   Creatinine, Ser 1.49 (H) 0.61 - 1.24 mg/dL   Calcium 8.8 (L) 8.9 - 10.3 mg/dL   Total Protein 5.8 (L) 6.5 -  8.1 g/dL   Albumin 2.4 (L) 3.5 - 5.0 g/dL   AST 35 15 - 41 U/L   ALT 28 17 - 63 U/L   Alkaline Phosphatase 86 38 - 126 U/L   Total Bilirubin 1.9 (H) 0.3 - 1.2 mg/dL   GFR calc non Af Amer 47 (L) >60 mL/min   GFR calc Af Amer  55 (L) >60 mL/min   Anion gap 9 5 - 15  Lipase, blood  Result Value Ref Range   Lipase 28 11 - 51 U/L  I-Stat Chem 8, ED  Result Value Ref Range   Sodium 133 (L) 135 - 145 mmol/L   Potassium 4.8 3.5 - 5.1 mmol/L   Chloride 97 (L) 101 - 111 mmol/L   BUN 31 (H) 6 - 20 mg/dL   Creatinine, Ser 1.30 (H) 0.61 - 1.24 mg/dL   Glucose, Bld 307 (H) 65 - 99 mg/dL   Calcium, Ion 1.10 (L) 1.15 - 1.40 mmol/L   TCO2 24 0 - 100 mmol/L   Hemoglobin 12.6 (L) 13.0 - 17.0 g/dL   HCT 37.0 (L) 39.0 - 52.0 %  I-Stat arterial blood gas, ED  Result Value Ref Range   pH, Arterial 7.441 7.350 - 7.450   pCO2 arterial 33.5 32.0 - 48.0 mmHg   pO2, Arterial 230.0 (H) 83.0 - 108.0 mmHg   Bicarbonate 22.8 20.0 - 28.0 mmol/L   TCO2 24 0 - 100 mmol/L   O2 Saturation 100.0 %   Acid-base deficit 1.0 0.0 - 2.0 mmol/L   Patient temperature 98.6 F    Collection site RADIAL, ALLEN'S TEST ACCEPTABLE    Drawn by RT    Sample type ARTERIAL    Dg Chest Portable 1 View  Result Date: 01/24/2016 CLINICAL DATA:  Endotracheal tube placement. EXAM: PORTABLE CHEST 1 VIEW COMPARISON:  Earlier this day at 2255 hour FINDINGS: Portable AP view at 0351 hour: Endotracheal tube is 1.7 cm from the carina. Enteric tube in place, tip and side-port below the diaphragm. Low lung volumes persist with bibasilar atelectasis. Unchanged mediastinal contours and heart size. Upper lungs are clear. IMPRESSION: 1. Endotracheal tube 1.7 cm from the carina.  Enteric tube in place. 2. Low lung volumes with bibasilar atelectasis. Electronically Signed   By: Jeb Levering M.D.   On: 01/16/2016 04:01   Dg Chest Portable 1 View  Result Date: 01/16/2016 CLINICAL DATA:  Altered mental status. EXAM: PORTABLE CHEST 1 VIEW COMPARISON:  11/15/2015 FINDINGS: Lung volumes are low. Normal cardiomediastinal contours for technique. Mild bibasilar atelectasis. No pulmonary edema, evidence of pleural fluid or pneumothorax. No acute osseous abnormality. IMPRESSION:  Hypoventilatory chest with bibasilar atelectasis. Electronically Signed   By: Jeb Levering M.D.   On: 01/17/2016 03:13   Radiology Medications  naloxone [facility]) 2 MG/2ML injection (not administered)  sodium chloride 0.9 % bolus 1,000 mL (1,000 mLs Intravenous New Bag/Given 01/20/2016 0544)  lactulose (CHRONULAC) 10 GM/15ML solution 30 g (not administered)  naloxone [facility]) injection 2 mg (2 mg Intravenous Given 01/17/2016 0614)  rocuronium (ZEMURON) injection 100 mg (100 mg Intravenous Given 01/05/2016 0335)  etomidate (AMIDATE) injection 20 mg (20 mg Intravenous Given 01/09/2016 0334)  sodium chloride 0.9 % bolus 1,000 mL (0 mLs Intravenous Stopped 01/02/2016 0435)  sodium chloride 0.9 % bolus 1,000 mL (0 mLs Intravenous Stopped 01/22/2016 0435)  sodium chloride 0.9 % bolus 1,000 mL (0 mLs Intravenous Stopped 01/14/2016 0530)   4 L NSS bolus   EKG Interpretation  Date/Time:  Saturday January 11 2016 02:32:35 EDT Ventricular Rate:  64 PR Interval:    QRS Duration: 108 QT Interval:  458 QTC Calculation: 473 R Axis:   86 Text Interpretation:  sinus rhythm Low voltage, extremity and precordial leads Consider anterior infarct Baseline wander in lead(s) II III aVF Confirmed by [facility]  MD, Emmaline Kluver ([id number]) on 01/16/2016 5:36:10 AM      Procedures Procedures  MDM Reviewed: previous chart, nursing note and vitals Reviewed previous: labs, x-ray and ECG Interpretation: labs, ECG and x-ray (thrombocytopenia, ETT and OG tube in good position. Ammonia elevate4d extremely at 328) Consults: critical care   CRITICAL CARE Performed by: Shantanique Hodo, MD Total critical care time: 120  minutes Critical care time was exclusive of separately billable procedures and treating other patients. Critical care was necessary to treat or prevent imminent or life-threatening deterioration. Critical care was time spent personally by me on the following activities: development of treatment plan with patient  and/or surrogate as well as nursing, discussions with consultants, evaluation of patient's response to treatment, examination of patient, obtaining history from patient or surrogate, ordering and performing treatments and interventions, ordering and review of laboratory studies, ordering and review of radiographic studies, pulse oximetry and re-evaluation of patient's condition.  INTUBATION Performed by: Donny Heffern, MD at 3:34 AM  Required items: required blood products, implants, devices, and special equipment available Patient identity confirmed: provided demographic data and hospital-assigned identification number Time out: Immediately prior to procedure a "time out" was called to verify the correct patient, procedure, equipment, support staff and site/side marked as required.  Indications: deterioration and AMS  Intubation method: Glidescope Laryngoscopy   Preoxygenation: 100% BVM  Sedatives: 20 Etomidate Paralytic: 100 rocuronium   Tube Size: 7.0 cuffed  Post-procedure assessment: chest rise and ETCO2 monitor Breath sounds: equal and absent over the epigastrium Tube secured with: ETT holder Chest x-ray interpreted by radiologist and me.  Chest x-ray findings: endotracheal tube in appropriate position  Patient tolerated the procedure well with no immediate complications.   Medications Ordered in ED Medications - No data to display  Initial Impression / Assessment and Plan / ED Course    Final Clinical Impressions(s) / ED Diagnoses   Final diagnoses:  None   New Prescriptions New Prescriptions   No medications on file   I personally performed the services described in this documentation, which was scribed in my presence. The recorded information has been reviewed and is accurate.       Veatrice Kells, MD 01/22/2016 [id number]    Kanitra Purifoy, MD 01/05/2016 [phone]

## 2016-01-11 NOTE — Progress Notes (Signed)
Family at the bedside.

## 2016-01-11 NOTE — H&P (Signed)
PULMONARY / CRITICAL CARE MEDICINE   Name: Ethan Wade MRN: [id number] DOB: 06/27/1950    ADMISSION DATE:  01/10/2016 CONSULTATION DATE: 01/18/2016  REFERRING MD:  ED Dr. Scherrie November  CHIEF COMPLAINT:  Altered mental status   HISTORY OF PRESENT ILLNESS:   Ethan Wade is a 65 yo male with PMH Chronic Hep C with Cirrhosis, chronic thrombocytopenia and hypnatremia, HTN, mitral valve prolapse, Hepatocellular carcinoma, hx rheumatic fever, protein-calorie malnutrition, DM2 who presents from SNF with AMS. Patient's brother and HCPOA Mr. Xyan Favinger provided history. He reports that patient has had multiple admissions for altered mental status which is due to his liver cirrhosis/HCC. More recently he reports patient has been having issues with elevated ammonia levels at the nursing home and reports that patient was unable to have a conversation with him. Early this morning [name] received a call regarding his brother's admission.    Patient is receiving treatment for [facility] at [facility]. Per chart review, it was noted that [name] after discharge from nursing facility, he plans to bring patient home with hospice or inpatient hospice. Dr. Myrtice Lauth discussed with [name] regarding goals of care. [name] believes that his brother (the patient) would not want to be on life support if he had poor quality of life. [name] would like to discuss goals of care with his family but will likely withdraw care.   In the ED patient was intubated for airway protection.  Significant labs include: wbc 5.4,  plt 71, Na 131 (chronic), Creatinine 1.49 ( baseline about the same), Lipase 28. . PH 7.44/33.5/230. CXR with bibasilar atelectasis. CT Head ordered and pending. ED ordered ultrasound paracentesis.   PAST MEDICAL HISTORY :  He  has a past medical history of AKI (acute kidney injury) ([place]) (11/15/2015); Arthritis; Ascites; Esophageal bleed, non-variceal (2006); Heart murmur; Hepatic encephalopathy  ([place]) (05/2015); Hepatitis C (1980's); HTN (hypertension); Liver cancer ([place]); Macrocytosis; Mitral valve prolapse; Prostate enlargement; Psoriasis; Rheumatic fever (1950's); Thrombocytopenia ([place]); Type II diabetes mellitus ([place]); Umbilical hernia; and Vitamin D deficiency.  PAST SURGICAL HISTORY: He  has a past surgical history that includes Tonsilectomy, adenoidectomy, bilateral myringotomy and tubes (child); Umbilical hernia repair (03/09/2012); Insertion of mesh (03/09/2012); Tonsillectomy; and Hernia repair.  No Known Allergies  No current facility-administered medications on file prior to encounter.    Current Outpatient Prescriptions on File Prior to Encounter  Medication Sig  . Carboxymethylcellul-Glycerin (CLEAR EYES FOR DRY EYES) 1-0.25 % SOLN Apply 1-2 drops to eye daily as needed (for dry eyes). Reported on 05/20/2015  . dronabinol (MARINOL) 2.5 MG capsule Take 1 capsule (2.5 mg total) by mouth 2 (two) times daily before a meal.  . furosemide (LASIX) 20 MG tablet Take 40 mg by mouth 2 (two) times daily.  Marland Kitchen ibuprofen (ADVIL,MOTRIN) 600 MG tablet Take 600 mg by mouth every 6 (six) hours as needed.  . insulin glargine (LANTUS) 100 UNIT/ML injection Inject 16 Units into the skin at bedtime.  . insulin lispro (HUMALOG) 100 UNIT/ML injection Inject into the skin. Inject as per sliding scale: if  70-150= 0  ; 151-200 = 0 units, 201-250= 3 units; 251-300= 5 units; 301-350= 7 units.  IF CBG  < 70 treat per protocol. If >35 notify MD, subcutaneously before meals and at bedtime related to DM.  Marland Kitchen lactulose (CHRONULAC) 10 GM/15ML solution Take 30 g by mouth 2 (two) times daily.   . nadolol (CORGARD) 20 MG tablet Take 1 tablet (20 mg total) by mouth daily.  Marland Kitchen  OxyCODONE HCl, Abuse Deter, (OXAYDO) 5 MG TABA Take 1 tablet by mouth every 6 (six) hours as needed.  Marland Kitchen spironolactone (ALDACTONE) 50 MG tablet Take 50 mg by mouth 2 (two) times daily.  . tamsulosin (FLOMAX) 0.4 MG CAPS capsule Take 0.4 mg by  mouth daily.  . Vitamin D, Ergocalciferol, (DRISDOL) 50000 units CAPS capsule Take 50,000 Units by mouth every 7 (seven) days.  Marland Kitchen XIFAXAN 550 MG TABS tablet Take 550 mg by mouth 2 (two) times daily.  Marland Kitchen zolpidem (AMBIEN) 5 MG tablet Take 1 tablet (5 mg total) by mouth at bedtime.    FAMILY HISTORY:  His indicated that his mother is deceased. He indicated that his father is deceased. He indicated that the status of his maternal grandfather is unknown.    SOCIAL HISTORY: He  reports that he has quit smoking. His smoking use included Cigars. He started smoking about 10 years ago. He quit after 20.00 years of use. He has quit using smokeless tobacco. His smokeless tobacco use included Snuff. He reports that he drinks alcohol. He reports that he uses drugs.  REVIEW OF SYSTEMS:   Unable to obtain due to intubation and AMS   VITAL SIGNS: BP 115/84   Pulse 67   Temp 97 F (36.1 C) (Rectal)   Resp 16   SpO2 100%   HEMODYNAMICS:    VENTILATOR SETTINGS: Vent Mode: PRVC FiO2 (%):  [60 %] 60 % Set Rate:  [16 bmp] 16 bmp Vt Set:  [580 mL] 580 mL PEEP:  [5 cmH20] 5 cmH20 Plateau Pressure:  [15 cmH20] 15 cmH20  INTAKE / OUTPUT: No intake/output data recorded.  PHYSICAL EXAMINATION: GEN: NAD, intubated HEENT: ET tube in place  CV: RRR, no murmurs, rubs, or gallops PULM: CTAB, normal effort ABD: somewhat soft but distended, fluid wave noted. Somewhat hypoactive bowel sounds  SKIN: No rash or cyanosis; warm and well-perfused EXTR: No lower extremity edema or calf tenderness; DP pulses thready  Neuro: sleeping, does not awake to noise   LABS:  BMET  Recent Labs Lab 12/31/2015 0250 01/10/2016 0315  NA 131* 133*  K 4.8 4.8  CL 100* 97*  CO2 22  --   BUN 28* 31*  CREATININE 1.49* 1.30*  GLUCOSE 321* 307*    Electrolytes  Recent Labs Lab 01/04/2016 0250  CALCIUM 8.8*    CBC  Recent Labs Lab 01/10/2016 0250 01/10/2016 0315  WBC 5.4  --   HGB 13.1 12.6*  HCT 37.8* 37.0*   PLT 71*  --     Coag's No results for input(s): APTT, INR in the last 168 hours.  Sepsis Markers No results for input(s): LATICACIDVEN, PROCALCITON, O2SATVEN in the last 168 hours.  ABG  Recent Labs Lab 01/26/2016 0403  PHART 7.441  PCO2ART 33.5  PO2ART 230.0*    Liver Enzymes  Recent Labs Lab 01/10/2016 0250  AST 35  ALT 28  ALKPHOS 86  BILITOT 1.9*  ALBUMIN 2.4*    Cardiac Enzymes No results for input(s): TROPONINI, PROBNP in the last 168 hours.  Glucose No results for input(s): GLUCAP in the last 168 hours.  Imaging Dg Chest Portable 1 View  Result Date: 01/19/2016 CLINICAL DATA:  Endotracheal tube placement. EXAM: PORTABLE CHEST 1 VIEW COMPARISON:  Earlier this day at 2255 hour FINDINGS: Portable AP view at 0351 hour: Endotracheal tube is 1.7 cm from the carina. Enteric tube in place, tip and side-port below the diaphragm. Low lung volumes persist with bibasilar atelectasis. Unchanged mediastinal contours  and heart size. Upper lungs are clear. IMPRESSION: 1. Endotracheal tube 1.7 cm from the carina.  Enteric tube in place. 2. Low lung volumes with bibasilar atelectasis. Electronically Signed   By: Jeb Levering M.D.   On: 01/27/2016 04:01   Dg Chest Portable 1 View  Result Date: 01/02/2016 CLINICAL DATA:  Altered mental status. EXAM: PORTABLE CHEST 1 VIEW COMPARISON:  11/15/2015 FINDINGS: Lung volumes are low. Normal cardiomediastinal contours for technique. Mild bibasilar atelectasis. No pulmonary edema, evidence of pleural fluid or pneumothorax. No acute osseous abnormality. IMPRESSION: Hypoventilatory chest with bibasilar atelectasis. Electronically Signed   By: Jeb Levering M.D.   On: 12/29/2015 03:13     STUDIES:  CXR: 10/14: bibasilar atelectasis   CULTURES: none  ANTIBIOTICS: none  SIGNIFICANT EVENTS: 10/14: admit for AMS and requiring intubation for airway protection   LINES/TUBES: ETT 10/14 >> PIV x 1  ASSESSMENT / PLAN: Mr.  Tefft is a 65yo male with PMH Chronic Hep C with Cirrhosis, chronic thrombocytopenia and hypnatremia, HTN, mitral valve prolapse, Hepatocellular carcinoma, hx rheumatic fever, protein-calorie malnutrition, DM2 who presents from SNF with AMS and required intubation for airway protection. Symptoms likely hepatic encephalopathy due to his cirrhosis/HCC. Has had multiple admissions in the past for same presentation. CT head is pending as well as some other labs for further evaluation of other possible causes. Discussed with [name] who is patient's HCPOA and brother who reports patient would not like to be on life support if he has poor quality of life. [name] will discuss with family but will likely withdraw care.   NEUROLOGIC A:   AMS: likely hepatic encephalopathy  P:   CT Head ordered in ED.   PULMONARY A: Intubated for Airway protection in the setting of AMS  P:   Vent Support   CARDIOVASCULAR A:  BP stable  P:  monitor  RENAL A:   Chronic Hyponatremia: in the setting of cirrhosis  Hypochloremia P:   Family to withdraw care therefore will not do invasive testing/lab checks   GASTROINTESTINAL A:   Liver Cirrhosis/HCC History of HCV P:   NPO Will hold off lactulose for now as HCPOA plans to withdraw care   HEMATOLOGIC A:   Mild Anemia:  At baseline P:  Family to withdraw care therefore will not do invasive testing/lab checks   INFECTIOUS A:   No issues  P:   Family to withdraw care therefore will not do invasive testing/lab checks   ENDOCRINE A:   Hyperglycemia P:   Family to withdraw care therefore will not do invasive testing/lab checks    FAMILY  - Updates: Discussed with HCPOA at bedside 10/14  - Inter-disciplinary family meet or Palliative Care meeting due by: 10/21   Pulmonary and [place] Pager: [phone]  01/28/2016, 4:52 AM

## 2016-01-11 NOTE — Progress Notes (Signed)
PCCM Attending Family Discussion Note: I personally discussed the patient's current clinical state with his brother, daughter, niece, ex-wife, and other family members who were present. Given his dismal prognosis and family agree that he would not wish to be sustained in his current state on a ventilator. We discussed proceeding with full comfort care and palliation of symptoms with a fentanyl infusion. Family are all in agreement with this course of action. We will notify hospital chaplain of their desire to have them at bedside for spiritual support. Withdrawal order set has been placed.  Sonia Baller Ashok Cordia, M.D. [facility] Pulmonary & Critical Care Pager:  [phone] After 3pm or if no response, call [phone] 3:00 PM 01/24/2016

## 2016-01-11 NOTE — Progress Notes (Signed)
RT note- Patient extubated per order and family and patient's wishes.

## 2016-01-11 NOTE — Progress Notes (Signed)
Chaplain responded to a page of a Pt who may be passing soon. Chaplain provided emotional and spiritual care via prayer.

## 2016-01-11 NOTE — ED Notes (Signed)
Attempted to call report

## 2016-01-11 NOTE — Progress Notes (Signed)
CRITICAL VALUE ALERT  Critical value received:  yes  Date of notification:  01/23/2016   Time of notification:  1222  Critical value read back: yes  Nurse who received alert: Darden Amber  MD notified (1st page):  Dr. Ashok Cordia in person  Time of first page:  NA  MD notified (2nd page):  Time of second page:  Responding MD:  Dr. Ashok Cordia  Time MD responded: [phone]

## 2016-01-11 NOTE — ED Notes (Signed)
Pt had bowel movement. Pt cleaned and repositioned in bed.

## 2016-01-12 DIAGNOSIS — L8990 Pressure ulcer of unspecified site, unspecified stage: Secondary | ICD-10-CM | POA: Insufficient documentation

## 2016-01-12 DIAGNOSIS — C220 Liver cell carcinoma: Secondary | ICD-10-CM

## 2016-01-12 DIAGNOSIS — K7290 Hepatic failure, unspecified without coma: Principal | ICD-10-CM

## 2016-01-12 DIAGNOSIS — Z515 Encounter for palliative care: Secondary | ICD-10-CM

## 2016-01-12 MED ORDER — WHITE PETROLATUM GEL
Status: AC
  Filled 2016-01-12: qty 1

## 2016-01-12 NOTE — Progress Notes (Signed)
PULMONARY / CRITICAL CARE MEDICINE   Name: Ethan Wade MRN: [id number] DOB: 02-12-1951    ADMISSION DATE:  01/19/2016 CONSULTATION DATE: 01/04/2016  REFERRING MD:  ED Dr. Scherrie November  CHIEF COMPLAINT:  Altered mental status   HISTORY OF PRESENT ILLNESS:   Ethan Wade is a 65 yo male with PMH Chronic Hep C with Cirrhosis, chronic thrombocytopenia and hypnatremia, HTN, mitral valve prolapse, Hepatocellular carcinoma, hx rheumatic fever, protein-calorie malnutrition, DM2 who presents from SNF with AMS. Patient's brother and HCPOA Mr. Elyjiah Borowsky provided history. He reports that patient has had multiple admissions for altered mental status which is due to his liver cirrhosis/HCC. More recently he reports patient has been having issues with elevated ammonia levels at the nursing home and reports that patient was unable to have a conversation with him. Early this morning [name] received a call regarding his brother's admission.    Patient is receiving treatment for [facility] at [facility]. Per chart review, it was noted that [name] after discharge from nursing facility, he plans to bring patient home with hospice or inpatient hospice. Dr. Myrtice Lauth discussed with [name] regarding goals of care. [name] believes that his brother (the patient) would not want to be on life support if he had poor quality of life. [name] would like to discuss goals of care with his family but will likely withdraw care.   In the ED patient was intubated for airway protection.  Significant labs include: wbc 5.4,  plt 71, Na 131 (chronic), Creatinine 1.49 ( baseline about the same), Lipase 28. . PH 7.44/33.5/230. CXR with bibasilar atelectasis. CT Head ordered and pending. ED ordered ultrasound paracentesis.   SUBJECTIVE:  Patient did have some nausea and vomiting yesterday after extubation and transition to full comfort care. Otherwise patient resting comfortably.  REVIEW OF SYSTEMS:  Unable to obtain  given altered mental status.  VITAL SIGNS: BP (!) 86/63   Pulse (!) 59   Temp 98.2 F (36.8 C) (Oral)   Resp 10   SpO2 97%   HEMODYNAMICS:    VENTILATOR SETTINGS: Vent Mode: PRVC FiO2 (%):  [40 %-60 %] 40 % Set Rate:  [16 bmp] 16 bmp Vt Set:  [580 mL] 580 mL PEEP:  [5 cmH20] 5 cmH20 Plateau Pressure:  [20 cmH20] 20 cmH20  INTAKE / OUTPUT: I/O last 3 completed shifts: In: 4056.3 [I.V.:56.3; IV Piggyback:4000] Out: -   PHYSICAL EXAMINATION: Gen.: Laying in bed. Daughter living in chair bedside sleeping. No distress. Integument: Warm and dry. No rash on exposed skin. HEENT: Eyes closed. Tacky mucous membranes. Pulmonary: Unlabored breathing on nasal cannula oxygen. Neurological: Patient appears comfortable. No spontaneous movements. Does not respond to soft voices.  LABS:  BMET  Recent Labs Lab 01/24/2016 0250 01/01/2016 0315  NA 131* 133*  K 4.8 4.8  CL 100* 97*  CO2 22  --   BUN 28* 31*  CREATININE 1.49* 1.30*  GLUCOSE 321* 307*    Electrolytes  Recent Labs Lab 01/17/2016 0250  CALCIUM 8.8*    CBC  Recent Labs Lab 01/12/2016 0250 12/30/2015 0315  WBC 5.4  --   HGB 13.1 12.6*  HCT 37.8* 37.0*  PLT 71*  --     Coag's No results for input(s): APTT, INR in the last 168 hours.  Sepsis Markers No results for input(s): LATICACIDVEN, PROCALCITON, O2SATVEN in the last 168 hours.  ABG  Recent Labs Lab 01/05/2016 0403  PHART 7.441  PCO2ART 33.5  PO2ART 230.0*  Liver Enzymes  Recent Labs Lab 01/01/2016 0250  AST 35  ALT 28  ALKPHOS 86  BILITOT 1.9*  ALBUMIN 2.4*    Cardiac Enzymes No results for input(s): TROPONINI, PROBNP in the last 168 hours.  Glucose No results for input(s): GLUCAP in the last 168 hours.  Imaging No results found.   STUDIES:  CXR: 10/14: bibasilar atelectasis  CT HEAD W/O 10/14:  Atrophy with chronic small vessel white matter ischemic disease. No acute intracranial abnormality.  MICROBIOLOGY: MRSA PCR 10/14:   Positive Blood Ctx x2 10/14 >>  ANTIBIOTICS: none  SIGNIFICANT EVENTS: 10/14 - admit for AMS and requiring intubation for airway protection  10/14 - transitioned to full comfort care & terminally extubated  LINES/TUBES: PIV x2  ASSESSMENT / PLAN:  65 y.o. male with PMH Chronic Hep C with Cirrhosis, chronic thrombocytopenia and hypnatremia, HTN, mitral valve prolapse, Hepatocellular carcinoma, hx rheumatic fever, protein-calorie malnutrition, DM2 who presents from SNF with AMS and required intubation for airway protection. Symptoms likely hepatic encephalopathy due to his cirrhosis/HCC. Has had multiple admissions in the past for same presentation. Per patient's brother he is not a transplant candidate. Yesterday patient transitioned to full comfort care and underwent terminal extubation.  1. Palliation/Comfort Care: Continuing atropine sublingual for oral secretions as needed. Continuing fentanyl infusion for relief of comfort and dyspnea. 2. Hepatic Encephalopathy: Holding off on lactulose and rifaximin for patient's comfort at this time. 3. Hepatocellular Carcinoma: Intolerant of chemotherapy. 4. Disposition: Transferring patient to floor bed for continued palliation of symptoms. If patient survives another 24 hours we will consider hospice/palliative medicine consult.   FAMILY  - Updates:  Daughter updated at bedside 10/15 by Dr. Ashok Cordia  - Inter-disciplinary family meet or Palliative Care meeting:  Occurred on 10/14 with Dr. Edgar Frisk E. Ashok Cordia, M.D. [facility] Pulmonary & Critical Care Pager:  [phone] After 3pm or if no response, call [phone] 01/12/2016, 7:23 AM

## 2016-01-12 NOTE — Progress Notes (Signed)
Fentanyl wasted 10cc. Withness by Ludwig Clarks RN

## 2016-01-12 NOTE — Progress Notes (Signed)
10cc of Fentanyl dirp wasted. Witnessed by Rose Fillers

## 2016-01-13 DIAGNOSIS — E7220 Disorder of urea cycle metabolism, unspecified: Secondary | ICD-10-CM

## 2016-01-13 DIAGNOSIS — R092 Respiratory arrest: Secondary | ICD-10-CM

## 2016-01-16 ENCOUNTER — Telehealth: Payer: Self-pay

## 2016-01-16 LAB — CULTURE, BLOOD (ROUTINE X 2)
CULTURE: NO GROWTH
Culture: NO GROWTH

## 2016-01-16 NOTE — Telephone Encounter (Signed)
On 01/16/2016 I received a death certificate from [employer] (original). The death certificate is for cremation. The patient is a patient of [facility]. The death certificate will be taken to [facility]) tomorrow for signature.  On 01-24-2016 I received the death certificate back from [place]. I got the death certificate ready and called the funeral home to let them know the death certificate is ready for pickup. I also faxed a copy to the funeral home per the funeral home request.

## 2016-01-29 NOTE — Progress Notes (Signed)
Wasted 67mL Fentanyl in sink with Theophilus Bones, RN.

## 2016-01-29 NOTE — Progress Notes (Signed)
   02/06/16 1300  Clinical Encounter Type  Visited With Patient and family together  Visit Type Follow-up  Referral From Other (Comment) ([place])  Consult/Referral To Chaplain (Unit chaplain )   - Introduced Chaplaincy services.  - Family communicated desire for Chaplain presence during/after change in pt. Condition.  Will follow, as needed.   - Rev. Chaplaincy Carnella [place] MDiv [facility]

## 2016-01-29 NOTE — Progress Notes (Signed)
Nutrition Brief Note  Chart reviewed. Pt now transitioning to comfort care.  No further nutrition interventions warranted at this time.  Please re-consult as needed.   Olando Willems A. Lamoine Fredricksen, RD, LDN, CDE Pager: [phone] After hours Pager: [phone]  

## 2016-01-29 NOTE — Progress Notes (Signed)
   01-Feb-2016 1900  Clinical Encounter Type  Visited With Family  Visit Type Death  Referral From Nurse  Stress Factors  Patient Stress Factors None identified  Family Stress Factors Loss   Chaplain responded to page for family support following death of patient. Family surrounded pt. Bedside and shared stories of a battle well fought and emotional healing which had recently taken place. Chaplain offered sacred text readings, empathetic listening and prayer.  Chaplain also offered support and instruction concerning transition from the hospital for the family.  Chaplain is available for further support if desired.  [employer], Chaplain

## 2016-01-29 NOTE — Accreditation Note (Signed)
Fentanyl 98mcg/ml, 120ml wasted with Rhae Hammock.

## 2016-01-29 NOTE — Progress Notes (Signed)
Time of Death 4. Pronounced by CJ, RN and Harlan Stains, RN. [facility] paged accordingly to sign death certificate.  [facility] notified. Family at the bedside. Brother Ardel Sloan is HCPOA. Family chose [facility] on [address].

## 2016-01-29 NOTE — Progress Notes (Signed)
PULMONARY / CRITICAL CARE MEDICINE   Name: Ethan Wade MRN: [id number] DOB: 23-Jun-1950    ADMISSION DATE:  01/22/2016 CONSULTATION DATE: 01/05/2016  REFERRING MD:  ED Dr. Scherrie November  CHIEF COMPLAINT:  Altered mental status   HISTORY OF PRESENT ILLNESS:   Ethan Wade is a 65 yo male with PMH Chronic Hep C with Cirrhosis, chronic thrombocytopenia and hypnatremia, HTN, mitral valve prolapse, Hepatocellular carcinoma, hx rheumatic fever, protein-calorie malnutrition, DM2 who presents from SNF with AMS. Patient's brother and HCPOA [name] provided history. He reports that patient has had multiple admissions for altered mental status which is due to his liver cirrhosis/HCC. More recently he reports patient has been having issues with elevated ammonia levels at the nursing home and reports that patient was unable to have a conversation with him. Early this morning Ethan Wade received a call regarding his brother's admission.    Patient is receiving treatment for [facility] at [facility]. Per chart review, it was noted that [name] after discharge from nursing facility, he plans to bring patient home with hospice or inpatient hospice. Dr. Myrtice Lauth discussed with [name] regarding goals of care. [name] believes that his brother (the patient) would not want to be on life support if he had poor quality of life. [name] would like to discuss goals of care with his family but will likely withdraw care.   In the ED patient was intubated for airway protection.  Significant labs include: wbc 5.4,  plt 71, Na 131 (chronic), Creatinine 1.49 ( baseline about the same), Lipase 28. . PH 7.44/33.5/230. CXR with bibasilar atelectasis. CT Head ordered and pending. ED ordered ultrasound paracentesis.   SUBJECTIVE:   In resp distress, getting fentanyl.   REVIEW OF SYSTEMS:  Unable to obtain given altered mental status.  VITAL SIGNS: BP (!) 86/63   Pulse 87   Temp 98.2 F (36.8 C) (Oral)    Resp 15   SpO2 90%   HEMODYNAMICS:    VENTILATOR SETTINGS:    INTAKE / OUTPUT: I/O last 3 completed shifts: In: 429.4 [I.V.:429.4] Out: 0   PHYSICAL EXAMINATION: Gen.: Laying in bed. In mild resp distress.  Integument: Warm and dry. No rash on exposed skin. HEENT: Eyes closed. Tacky mucous membranes. Pulmonary: Labored breathing on [place] [facility]: Patient appears a bit uncomfortable. No spontaneous movements. Does not respond to soft voices.  LABS:  BMET  Recent Labs Lab 01/12/2016 0250 01/06/2016 0315  NA 131* 133*  K 4.8 4.8  CL 100* 97*  CO2 22  --   BUN 28* 31*  CREATININE 1.49* 1.30*  GLUCOSE 321* 307*    Electrolytes  Recent Labs Lab 01/12/2016 0250  CALCIUM 8.8*    CBC  Recent Labs Lab 01/12/2016 0250 01/18/2016 0315  WBC 5.4  --   HGB 13.1 12.6*  HCT 37.8* 37.0*  PLT 71*  --     Coag's No results for input(s): APTT, INR in the last 168 hours.  Sepsis Markers No results for input(s): LATICACIDVEN, PROCALCITON, O2SATVEN in the last 168 hours.  ABG  Recent Labs Lab 01/14/2016 0403  PHART 7.441  PCO2ART 33.5  PO2ART 230.0*    Liver Enzymes  Recent Labs Lab 01/10/2016 0250  AST 35  ALT 28  ALKPHOS 86  BILITOT 1.9*  ALBUMIN 2.4*    Cardiac Enzymes No results for input(s): TROPONINI, PROBNP in the last 168 hours.  Glucose No results for input(s): GLUCAP in the last 168 hours.  Imaging No results  found.   STUDIES:  CXR: 10/14: bibasilar atelectasis  CT HEAD W/O 10/14:  Atrophy with chronic small vessel white matter ischemic disease. No acute intracranial abnormality.  MICROBIOLOGY: MRSA PCR 10/14:  Positive Blood Ctx x2 10/14 >>  ANTIBIOTICS: none  SIGNIFICANT EVENTS: 10/14 - admit for AMS and requiring intubation for airway protection  10/14 - transitioned to full comfort care & terminally extubated  LINES/TUBES: PIV x2  ASSESSMENT / PLAN:  65 y.o. male with PMH Chronic Hep C with Cirrhosis, chronic thrombocytopenia  and hypnatremia, HTN, mitral valve prolapse, Hepatocellular carcinoma, hx rheumatic fever, protein-calorie malnutrition, DM2 who presents from SNF with AMS and required intubation for airway protection. Symptoms likely hepatic encephalopathy due to his cirrhosis/HCC. Has had multiple admissions in the past for same presentation. Per patient's brother he is not a transplant candidate.  Terminalle extubated on 10/14.   1. Palliation/Comfort Care: Continuing atropine sublingual for oral secretions as needed. Continuing fentanyl infusion for relief of comfort and dyspnea. May need to increase fentanyl pushes 2/2 resp distress and pt being uncomfortable.  2. Hepatic Encephalopathy: Holding off on lactulose and rifaximin for patient's comfort at this time. 3. Hepatocellular Carcinoma: Intolerant of chemotherapy.  Disposition: will consult palliative care team in am if necessary.    FAMILY  - Updates:  Daughter updated at bedside 10/15 by Dr. Ashok Cordia. Brother, daughter, niece updated at bedside on 10/16.   - Inter-disciplinary family meet or Palliative Care meeting:  Occurred on 10/14 with Dr. Pilar Plate, MD January 21, 2016, 4:49 PM [place] [facility] Pager [phone] After 3 pm or if no answer, call [phone]

## 2016-01-29 NOTE — Discharge Summary (Signed)
PULMONARY / CRITICAL CARE MEDICINE   Name: Ethan Wade MRN: [id number] DOB: Oct 27, 1950    ADMISSION DATE:  01/04/2016 CONSULTATION DATE: 01/15/2016  REFERRING MD:  ED Dr. Scherrie November  CHIEF COMPLAINT:  Altered mental status   HISTORY OF PRESENT ILLNESS:   Mr. Ethan Wade is a 65 yo male with PMH Chronic Hep C with Cirrhosis, chronic thrombocytopenia and hypnatremia, HTN, mitral valve prolapse, Hepatocellular carcinoma, hx rheumatic fever, protein-calorie malnutrition, DM2 who presents from SNF with AMS. Patient's brother and HCPOA Mr. Ethan Wade provided history. He reports that patient has had multiple admissions for altered mental status which is due to his liver cirrhosis/HCC. More recently he reports patient has been having issues with elevated ammonia levels at the nursing home and reports that patient was unable to have a conversation with him. Early this morning Mr. Ethan Wade received a call regarding his brother's admission.    Patient is receiving treatment for Ethan Wade at [facility]. Per chart review, it was noted that Mr. Ethan Wade after discharge from nursing facility, he plans to bring patient home with hospice or inpatient hospice. Dr. Myrtice Lauth discussed with Mr. Ethan Wade regarding goals of care. Mr. Ethan Wade believes that his brother (the patient) would not want to be on life support if he had poor quality of life. Mr. Ethan Wade would like to discuss goals of care with his family but will likely withdraw care.   In the ED patient was intubated for airway protection.  Significant labs include: wbc 5.4,  plt 71, Na 131 (chronic), Creatinine 1.49 ( baseline about the same), Lipase 28. . PH 7.44/33.5/230. CXR with bibasilar atelectasis. CT Head ordered and pending. ED ordered ultrasound paracentesis.   PAST MEDICAL HISTORY :  He  has a past medical history of AKI (acute kidney injury) ([place]) (11/15/2015); Arthritis; Ascites; Esophageal bleed, non-variceal (2006); Heart murmur; Hepatic encephalopathy  ([place]) (05/2015); Hepatitis C (1980's); HTN (hypertension); Liver cancer ([place]); Macrocytosis; Mitral valve prolapse; Prostate enlargement; Psoriasis; Rheumatic fever (1950's); Thrombocytopenia ([place]); Type II diabetes mellitus ([place]); Umbilical hernia; and Vitamin D deficiency.  PAST SURGICAL HISTORY: He  has a past surgical history that includes Tonsilectomy, adenoidectomy, bilateral myringotomy and tubes (child); Umbilical hernia repair (03/09/2012); Insertion of mesh (03/09/2012); Tonsillectomy; and Hernia repair.  No Known Allergies  No current facility-administered medications on file prior to encounter.    Current Outpatient Prescriptions on File Prior to Encounter  Medication Sig  . Carboxymethylcellul-Glycerin (CLEAR EYES FOR DRY EYES) 1-0.25 % SOLN Apply 1 drop to eye daily as needed (for dry eyes). Reported on 05/20/2015  . dronabinol (MARINOL) 2.5 MG capsule Take 1 capsule (2.5 mg total) by mouth 2 (two) times daily before a meal.  . furosemide (LASIX) 20 MG tablet Take 40 mg by mouth 2 (two) times daily.  Marland Kitchen ibuprofen (ADVIL,MOTRIN) 600 MG tablet Take 600 mg by mouth every 6 (six) hours as needed.  . insulin glargine (LANTUS) 100 UNIT/ML injection Inject 16 Units into the skin at bedtime.  . insulin lispro (HUMALOG) 100 UNIT/ML injection Inject into the skin. Inject as per sliding scale: if  70-150= 0  ; 151-200 = 0 units, 201-250= 3 units; 251-300= 5 units; 301-350= 7 units.  IF CBG  < 70 treat per protocol. If >35 notify MD, subcutaneously before meals and at bedtime related to DM.  Marland Kitchen lactulose (CHRONULAC) 10 GM/15ML solution Take 20 g by mouth 3 (three) times daily.   . nadolol (CORGARD) 20 MG tablet Take 1 tablet (20 mg total) by mouth daily.  Marland Kitchen  spironolactone (ALDACTONE) 50 MG tablet Take 50 mg by mouth 2 (two) times daily.  . tamsulosin (FLOMAX) 0.4 MG CAPS capsule Take 0.4 mg by mouth daily.  . Vitamin D, Ergocalciferol, (DRISDOL) 50000 units CAPS capsule Take 50,000 Units by mouth  every 7 (seven) days.  Marland Kitchen XIFAXAN 550 MG TABS tablet Take 550 mg by mouth 2 (two) times daily.  Marland Kitchen zolpidem (AMBIEN) 5 MG tablet Take 1 tablet (5 mg total) by mouth at bedtime. (Patient taking differently: Take 5 mg by mouth at bedtime as needed (for insomnia). )    FAMILY HISTORY:  His indicated that his mother is deceased. He indicated that his father is deceased. He indicated that the status of his maternal grandfather is unknown.    SOCIAL HISTORY: He  reports that he has quit smoking. His smoking use included Cigars. He started smoking about 10 years ago. He quit after 20.00 years of use. He has quit using smokeless tobacco. His smokeless tobacco use included Snuff. He reports that he drinks alcohol. He reports that he uses drugs.  REVIEW OF SYSTEMS:   Unable to obtain due to intubation and AMS   VITAL SIGNS: BP (!) 86/63   Pulse 87   Temp 98.2 F (36.8 C) (Oral)   Resp 15   Wt 145 lb 15.1 oz (66.2 kg)   SpO2 90%   BMI 20.94 kg/m   HEMODYNAMICS:    VENTILATOR SETTINGS:    INTAKE / OUTPUT: I/O last 3 completed shifts: In: 120 [I.V.:120] Out: 0   PHYSICAL EXAMINATION: GEN: NAD, intubated HEENT: ET tube in place  CV: RRR, no murmurs, rubs, or gallops PULM: CTAB, normal effort ABD: somewhat soft but distended, fluid wave noted. Somewhat hypoactive bowel sounds  SKIN: No rash or cyanosis; warm and well-perfused EXTR: No lower extremity edema or calf tenderness; DP pulses thready  Neuro: sleeping, does not awake to noise   LABS:  BMET  Recent Labs Lab 01/03/2016 0250 01/14/2016 0315  NA 131* 133*  K 4.8 4.8  CL 100* 97*  CO2 22  --   BUN 28* 31*  CREATININE 1.49* 1.30*  GLUCOSE 321* 307*    Electrolytes  Recent Labs Lab 12/29/2015 0250  CALCIUM 8.8*    CBC  Recent Labs Lab 01/26/2016 0250 01/25/2016 0315  WBC 5.4  --   HGB 13.1 12.6*  HCT 37.8* 37.0*  PLT 71*  --     Coag's No results for input(s): APTT, INR in the last 168 hours.  Sepsis  Markers No results for input(s): LATICACIDVEN, PROCALCITON, O2SATVEN in the last 168 hours.  ABG  Recent Labs Lab 12/31/2015 0403  PHART 7.441  PCO2ART 33.5  PO2ART 230.0*    Liver Enzymes  Recent Labs Lab 12/29/2015 0250  AST 35  ALT 28  ALKPHOS 86  BILITOT 1.9*  ALBUMIN 2.4*    Cardiac Enzymes No results for input(s): TROPONINI, PROBNP in the last 168 hours.  Glucose No results for input(s): GLUCAP in the last 168 hours.  Imaging No results found.   STUDIES:  CXR: 10/14: bibasilar atelectasis   CULTURES: none  ANTIBIOTICS: none  SIGNIFICANT EVENTS: 10/14: admit for AMS and requiring intubation for airway protection   LINES/TUBES: ETT 10/14 >> PIV x 1  ASSESSMENT / PLAN: Mr. Gean is a 64yo male with PMH Chronic Hep C with Cirrhosis, chronic thrombocytopenia and hypnatremia, HTN, mitral valve prolapse, Hepatocellular carcinoma, hx rheumatic fever, protein-calorie malnutrition, DM2 who presents from SNF with AMS and required intubation  for airway protection. Symptoms likely hepatic encephalopathy due to his cirrhosis/HCC. Has had multiple admissions in the past for same presentation. CT head is pending as well as some other labs for further evaluation of other possible causes. Discussed with Mr. Ethan Wade who is patient's HCPOA and brother who reports patient would not like to be on life support if he has poor quality of life. Mr. Ethan Wade will discuss with family but will likely withdraw care.   NEUROLOGIC A:   AMS: likely hepatic encephalopathy  P:   CT Head ordered in ED.   PULMONARY A: Intubated for Airway protection in the setting of AMS  P:   Vent Support   CARDIOVASCULAR A:  BP stable  P:  monitor  RENAL A:   Chronic Hyponatremia: in the setting of cirrhosis  Hypochloremia P:   Family to withdraw care therefore will not do invasive testing/lab checks   GASTROINTESTINAL A:   Liver Cirrhosis/HCC History of HCV P:   NPO Will hold  off lactulose for now as HCPOA plans to withdraw care   HEMATOLOGIC A:   Mild Anemia:  At baseline P:  Family to withdraw care therefore will not do invasive testing/lab checks   INFECTIOUS A:   No issues  P:   Family to withdraw care therefore will not do invasive testing/lab checks   ENDOCRINE A:   Hyperglycemia P:   Family to withdraw care therefore will not do invasive testing/lab checks    FAMILY  - Updates: Discussed with HCPOA at bedside 10/14  - Inter-disciplinary family meet or Palliative Care meeting due by: 10/21   Pulmonary and [place] Pager: [phone]  01/14/2016, 1:53 PM  Addendum : Pt had a complicated hospital stay. Extensive discussion made with family.  Patient eventually was made comfort care. He was transferred to the floors. Pls see my last note :  PULMONARY / CRITICAL CARE MEDICINE   Name: Ethan Wade MRN: [id number] DOB: 1950-10-11    ADMISSION DATE:  01/12/2016 CONSULTATION DATE: 01/26/2016  REFERRING MD:  ED Dr. Scherrie November  CHIEF COMPLAINT:  Altered mental status   HISTORY OF PRESENT ILLNESS:   Mr. Spenner is a 65 yo male with PMH Chronic Hep C with Cirrhosis, chronic thrombocytopenia and hypnatremia, HTN, mitral valve prolapse, Hepatocellular carcinoma, hx rheumatic fever, protein-calorie malnutrition, DM2 who presents from SNF with AMS. Patient's brother and HCPOA Mr. Yavuz Blackley provided history. He reports that patient has had multiple admissions for altered mental status which is due to his liver cirrhosis/HCC. More recently he reports patient has been having issues with elevated ammonia levels at the nursing home and reports that patient was unable to have a conversation with him. Early this morning Mr. Ethan Wade received a call regarding his brother's admission.    Patient is receiving treatment for [facility] at [facility]. Per chart review, it was noted that Mr. Ethan Wade after discharge from nursing  facility, he plans to bring patient home with hospice or inpatient hospice. Dr. Myrtice Lauth discussed with Mr. Ethan Wade regarding goals of care. Mr. Ethan Wade believes that his brother (the patient) would not want to be on life support if he had poor quality of life. Mr. Ethan Wade would like to discuss goals of care with his family but will likely withdraw care.   In the ED patient was intubated for airway protection.  Significant labs include: wbc 5.4,  plt 71, Na 131 (chronic), Creatinine 1.49 ( baseline about the same), Lipase 28. . PH 7.44/33.5/230. CXR  with bibasilar atelectasis. CT Head ordered and pending. ED ordered ultrasound paracentesis.   SUBJECTIVE:   In resp distress, getting fentanyl.   REVIEW OF SYSTEMS:  Unable to obtain given altered mental status.  VITAL SIGNS: BP (!) 86/63   Pulse 87   Temp 98.2 F (36.8 C) (Oral)   Resp 15   Wt 145 lb 15.1 oz (66.2 kg)   SpO2 90%   BMI 20.94 kg/m   HEMODYNAMICS:    VENTILATOR SETTINGS:    INTAKE / OUTPUT: I/O last 3 completed shifts: In: 120 [I.V.:120] Out: 0   PHYSICAL EXAMINATION: Gen.: Laying in bed. In mild resp distress.  Integument: Warm and dry. No rash on exposed skin. HEENT: Eyes closed. Tacky mucous membranes. Pulmonary: Labored breathing on [place] [facility]: Patient appears a bit uncomfortable. No spontaneous movements. Does not respond to soft voices.  LABS:  BMET  Recent Labs Lab 01/19/2016 0250 01/02/2016 0315  NA 131* 133*  K 4.8 4.8  CL 100* 97*  CO2 22  --   BUN 28* 31*  CREATININE 1.49* 1.30*  GLUCOSE 321* 307*    Electrolytes  Recent Labs Lab 01/28/2016 0250  CALCIUM 8.8*    CBC  Recent Labs Lab 01/24/2016 0250 01/05/2016 0315  WBC 5.4  --   HGB 13.1 12.6*  HCT 37.8* 37.0*  PLT 71*  --     Coag's No results for input(s): APTT, INR in the last 168 hours.  Sepsis Markers No results for input(s): LATICACIDVEN, PROCALCITON, O2SATVEN in the last 168 hours.  ABG  Recent Labs Lab  01/08/2016 0403  PHART 7.441  PCO2ART 33.5  PO2ART 230.0*    Liver Enzymes  Recent Labs Lab 01/19/2016 0250  AST 35  ALT 28  ALKPHOS 86  BILITOT 1.9*  ALBUMIN 2.4*    Cardiac Enzymes No results for input(s): TROPONINI, PROBNP in the last 168 hours.  Glucose No results for input(s): GLUCAP in the last 168 hours.  Imaging No results found.   STUDIES:  CXR: 10/14: bibasilar atelectasis  CT HEAD W/O 10/14:  Atrophy with chronic small vessel white matter ischemic disease. No acute intracranial abnormality.  MICROBIOLOGY: MRSA PCR 10/14:  Positive Blood Ctx x2 10/14 >>  ANTIBIOTICS: none  SIGNIFICANT EVENTS: 10/14 - admit for AMS and requiring intubation for airway protection  10/14 - transitioned to full comfort care & terminally extubated  LINES/TUBES: PIV x2  ASSESSMENT / PLAN:  65 y.o. male with PMH Chronic Hep C with Cirrhosis, chronic thrombocytopenia and hypnatremia, HTN, mitral valve prolapse, Hepatocellular carcinoma, hx rheumatic fever, protein-calorie malnutrition, DM2 who presents from SNF with AMS and required intubation for airway protection. Symptoms likely hepatic encephalopathy due to his cirrhosis/HCC. Has had multiple admissions in the past for same presentation. Per patient's brother he is not a transplant candidate.  Terminalle extubated on 10/14.   1. Palliation/Comfort Care: Continuing atropine sublingual for oral secretions as needed. Continuing fentanyl infusion for relief of comfort and dyspnea. May need to increase fentanyl pushes 2/2 resp distress and pt being uncomfortable.  2. Hepatic Encephalopathy: Holding off on lactulose and rifaximin for patient's comfort at this time. 3. Hepatocellular Carcinoma: Intolerant of chemotherapy.  Disposition: will consult palliative care team in am if necessary.    FAMILY  - Updates:  Daughter updated at bedside 10/15 by Dr. Ashok Cordia. Brother, daughter, niece updated at bedside on 10/16.   -  Inter-disciplinary family meet or Palliative Care meeting:  Occurred on 10/14 with Dr. Vaughan Browner.  Monica Becton, MD 01/14/2016, 1:54 PM [place] [facility] Pager [phone] After 3 pm or if no answer, call [phone]   Addendum: pt was pronounced dead on 2016/01/22 at 18:56. Family at bedside.   Monica Becton, MD 01/14/2016, 1:55 PM [place] [facility] Pager [phone] After 3 pm or if no answer, call [phone]

## 2016-01-29 DEATH — deceased

## 2016-06-19 IMAGING — US US PARACENTESIS
1 series · 2 of 2 positions shown · non-contrast
Comparison: None.

CLINICAL DATA: Ascites secondary to chronic hepatitis C with
cirrhosis and liver cancer

EXAM:
ULTRASOUND GUIDED RIGHT LOWER QUADRANT PARACENTESIS

[Series 1: us paracentesis · 0.27mm/px · 2 of 2 slices shown]
[im 1/2]
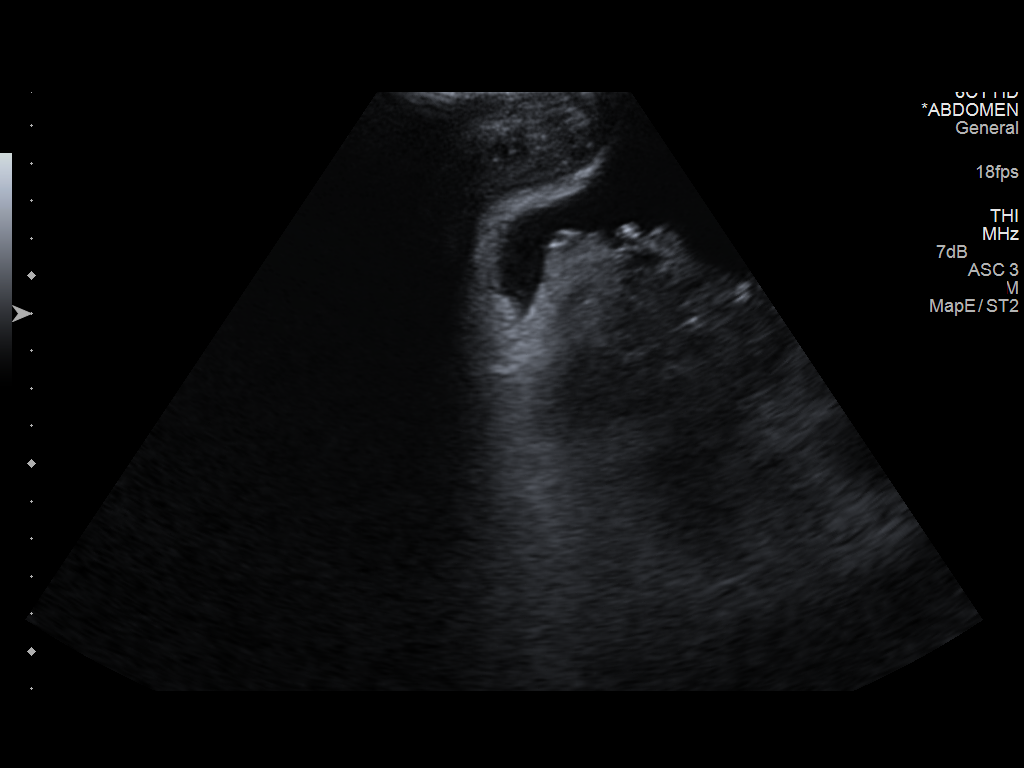
[im 2/2]
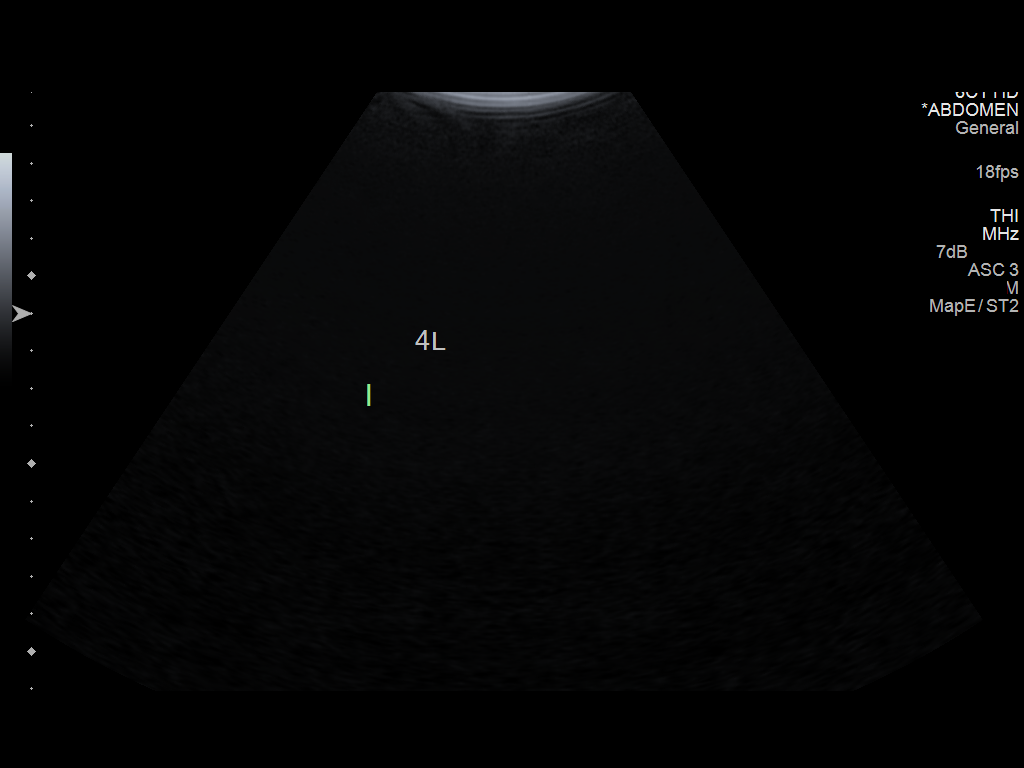

[2 of 2 positions shown; findings below may reference images not displayed]

PROCEDURE:
An ultrasound guided paracentesis was thoroughly discussed with the
patient and questions answered. The benefits, risks, alternatives
and complications were also discussed. The patient understands and
wishes to proceed with the procedure. Written consent was obtained.

Ultrasound was performed to localize and mark an adequate pocket of
fluid in the right lower quadrant of the abdomen. The area was then
prepped and draped in the normal sterile fashion. 1% Lidocaine was
used for local anesthesia. Under ultrasound guidance a 19 gauge Yueh
catheter was introduced. Paracentesis was performed. The catheter
was removed and a dressing applied.

COMPLICATIONS:
None.
FINDINGS: A total of approximately 4 liters of clear yellow fluid was removed.
A fluid sample was not sent for laboratory analysis.
IMPRESSION: Successful ultrasound guided paracentesis yielding 4 liters of
ascites.

## 2016-08-20 IMAGING — CR DG CHEST 1V PORT
1 series · 1 of 1 positions shown · non-contrast
Comparison: 04/01/2014

CLINICAL DATA: Altered mental status, no chest pain or shortness of
breath

EXAM:
PORTABLE CHEST 1 VIEW

[AP]
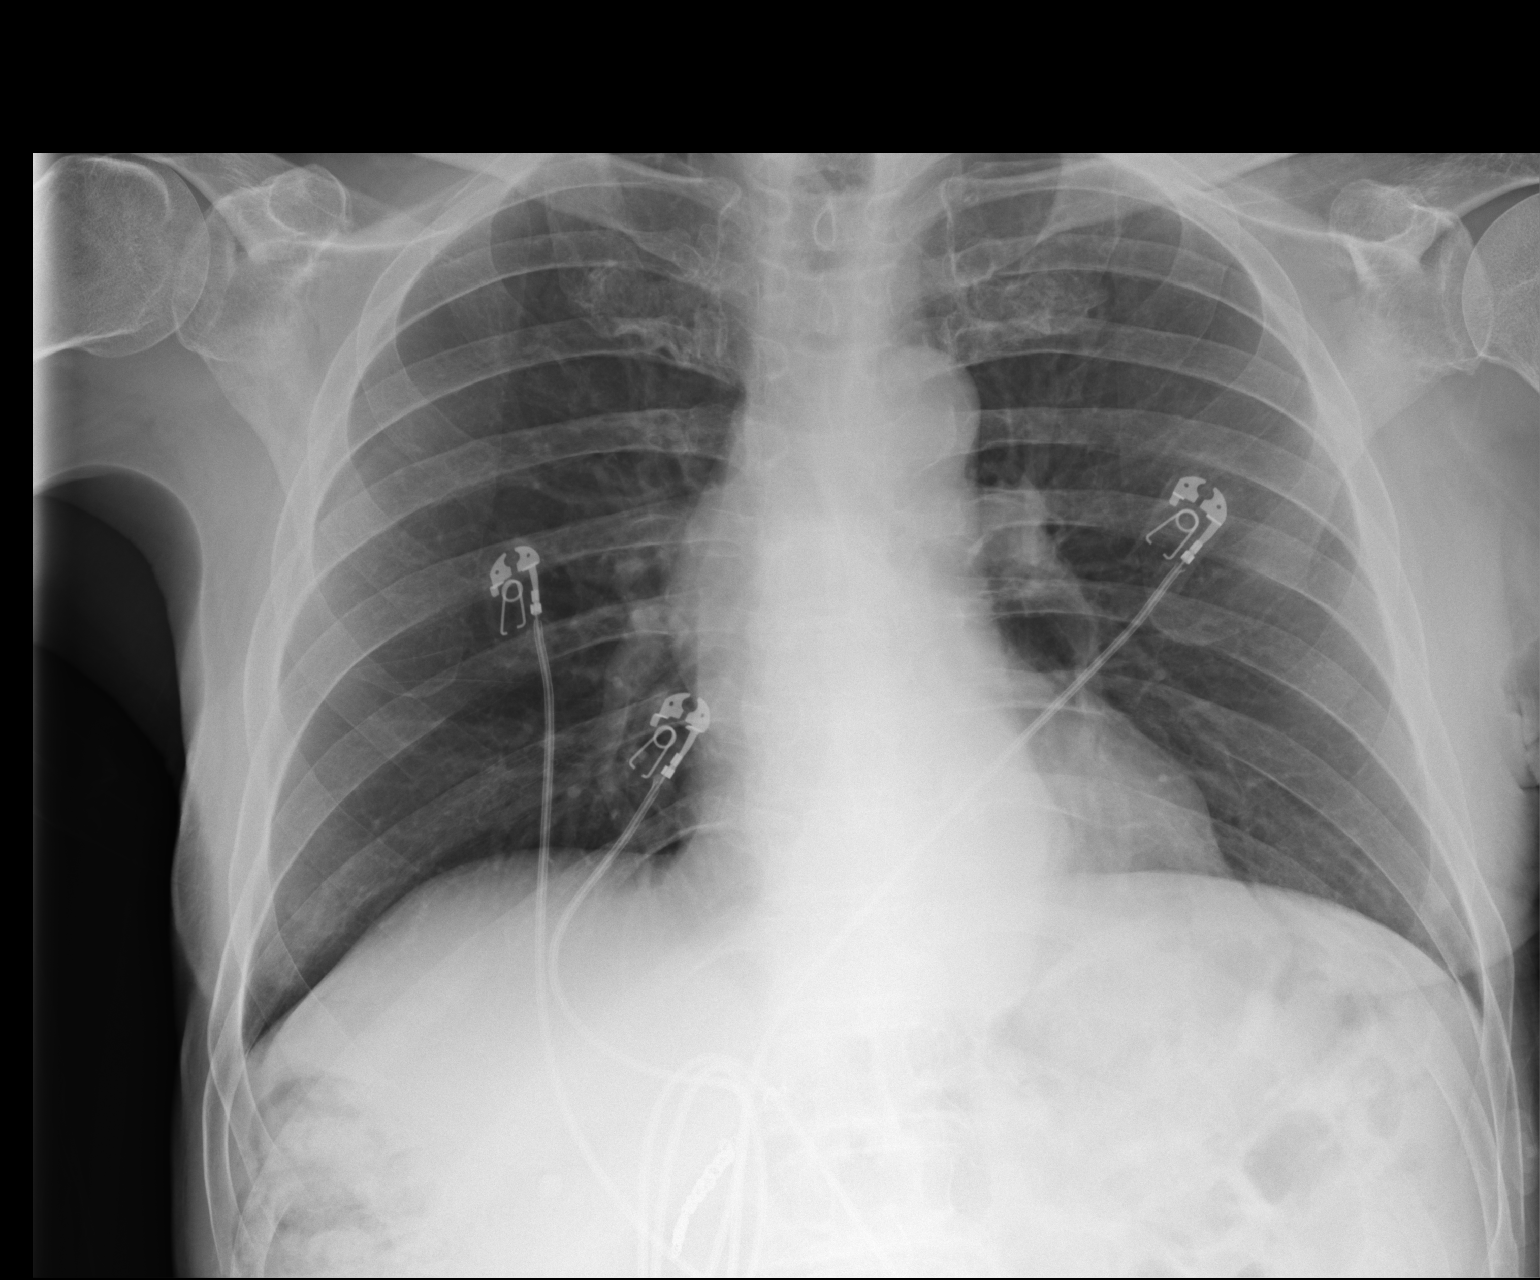

[1 of 1 positions shown; findings below may reference images not displayed]

FINDINGS: The heart size and mediastinal contours are within normal limits.
Both lungs are clear. The visualized skeletal structures are
unremarkable.
IMPRESSION: No active disease.
